# Patient Record
Sex: Female | Born: 1940 | Race: White | Hispanic: No | Marital: Married | Smoking: Never smoker
Health system: Southern US, Community
[De-identification: ages and names within clinical notes are randomized; demographics above are authoritative.]

## PROBLEM LIST (undated history)

## (undated) ENCOUNTER — Emergency Department (HOSPITAL_COMMUNITY): Admission: EM | Discharge status: Absent without leave | Payer: Medicare Other

## (undated) DIAGNOSIS — J189 Pneumonia, unspecified organism: Secondary | ICD-10-CM

## (undated) DIAGNOSIS — E785 Hyperlipidemia, unspecified: Secondary | ICD-10-CM

## (undated) DIAGNOSIS — E669 Obesity, unspecified: Secondary | ICD-10-CM

## (undated) DIAGNOSIS — I480 Paroxysmal atrial fibrillation: Secondary | ICD-10-CM

## (undated) DIAGNOSIS — E119 Type 2 diabetes mellitus without complications: Secondary | ICD-10-CM

## (undated) DIAGNOSIS — G4733 Obstructive sleep apnea (adult) (pediatric): Secondary | ICD-10-CM

## (undated) DIAGNOSIS — F329 Major depressive disorder, single episode, unspecified: Secondary | ICD-10-CM

## (undated) DIAGNOSIS — D509 Iron deficiency anemia, unspecified: Secondary | ICD-10-CM

## (undated) DIAGNOSIS — Z9989 Dependence on other enabling machines and devices: Secondary | ICD-10-CM

## (undated) DIAGNOSIS — I10 Essential (primary) hypertension: Secondary | ICD-10-CM

## (undated) DIAGNOSIS — F32A Depression, unspecified: Secondary | ICD-10-CM

## (undated) DIAGNOSIS — C4490 Unspecified malignant neoplasm of skin, unspecified: Secondary | ICD-10-CM

## (undated) DIAGNOSIS — I2510 Atherosclerotic heart disease of native coronary artery without angina pectoris: Secondary | ICD-10-CM

## (undated) DIAGNOSIS — R011 Cardiac murmur, unspecified: Secondary | ICD-10-CM

## (undated) DIAGNOSIS — K219 Gastro-esophageal reflux disease without esophagitis: Secondary | ICD-10-CM

## (undated) HISTORY — PX: CATARACT EXTRACTION W/ INTRAOCULAR LENS  IMPLANT, BILATERAL: SHX1307

## (undated) HISTORY — DX: Obesity, unspecified: E66.9

## (undated) HISTORY — PX: BACK SURGERY: SHX140

## (undated) HISTORY — DX: Cardiac murmur, unspecified: R01.1

## (undated) HISTORY — PX: TUBAL LIGATION: SHX77

## (undated) HISTORY — DX: Hyperlipidemia, unspecified: E78.5

## (undated) HISTORY — PX: ANTERIOR CERVICAL DECOMP/DISCECTOMY FUSION: SHX1161

## (undated) HISTORY — PX: EYE SURGERY: SHX253

## (undated) HISTORY — DX: Paroxysmal atrial fibrillation: I48.0

## (undated) HISTORY — PX: ABDOMINAL HYSTERECTOMY: SHX81

## (undated) HISTORY — DX: Essential (primary) hypertension: I10

---

## 1999-09-30 ENCOUNTER — Encounter: Admission: RE | Admit: 1999-09-30 | Discharge: 1999-09-30 | Payer: Self-pay | Admitting: Family Medicine

## 1999-09-30 ENCOUNTER — Encounter: Payer: Self-pay | Admitting: Family Medicine

## 1999-11-17 ENCOUNTER — Encounter: Payer: Self-pay | Admitting: Gastroenterology

## 1999-11-17 ENCOUNTER — Ambulatory Visit (HOSPITAL_COMMUNITY): Admission: RE | Admit: 1999-11-17 | Discharge: 1999-11-17 | Payer: Self-pay | Admitting: Gastroenterology

## 2000-01-29 ENCOUNTER — Emergency Department (HOSPITAL_COMMUNITY): Admission: EM | Admit: 2000-01-29 | Discharge: 2000-01-29 | Payer: Self-pay | Admitting: Emergency Medicine

## 2000-10-10 ENCOUNTER — Encounter: Admission: RE | Admit: 2000-10-10 | Discharge: 2000-10-10 | Payer: Self-pay | Admitting: Family Medicine

## 2000-10-10 ENCOUNTER — Encounter: Payer: Self-pay | Admitting: Family Medicine

## 2001-08-16 ENCOUNTER — Encounter: Admission: RE | Admit: 2001-08-16 | Discharge: 2001-08-16 | Payer: Self-pay | Admitting: Family Medicine

## 2001-08-16 ENCOUNTER — Encounter: Payer: Self-pay | Admitting: Family Medicine

## 2001-10-18 ENCOUNTER — Encounter: Payer: Self-pay | Admitting: Family Medicine

## 2001-10-18 ENCOUNTER — Encounter: Admission: RE | Admit: 2001-10-18 | Discharge: 2001-10-18 | Payer: Self-pay | Admitting: Family Medicine

## 2002-10-23 ENCOUNTER — Encounter: Payer: Self-pay | Admitting: Family Medicine

## 2002-10-23 ENCOUNTER — Encounter: Admission: RE | Admit: 2002-10-23 | Discharge: 2002-10-23 | Payer: Self-pay | Admitting: Family Medicine

## 2003-11-26 ENCOUNTER — Encounter: Admission: RE | Admit: 2003-11-26 | Discharge: 2003-11-26 | Payer: Self-pay | Admitting: Family Medicine

## 2005-01-05 ENCOUNTER — Encounter: Admission: RE | Admit: 2005-01-05 | Discharge: 2005-01-05 | Payer: Self-pay | Admitting: Family Medicine

## 2006-01-12 ENCOUNTER — Encounter: Admission: RE | Admit: 2006-01-12 | Discharge: 2006-01-12 | Payer: Self-pay | Admitting: Family Medicine

## 2006-10-24 ENCOUNTER — Ambulatory Visit (HOSPITAL_COMMUNITY): Admission: RE | Admit: 2006-10-24 | Discharge: 2006-10-24 | Payer: Self-pay | Admitting: Gastroenterology

## 2007-02-15 ENCOUNTER — Encounter: Admission: RE | Admit: 2007-02-15 | Discharge: 2007-02-15 | Payer: Self-pay | Admitting: Family Medicine

## 2008-03-23 ENCOUNTER — Encounter: Admission: RE | Admit: 2008-03-23 | Discharge: 2008-03-23 | Payer: Self-pay | Admitting: Family Medicine

## 2009-04-16 ENCOUNTER — Encounter: Admission: RE | Admit: 2009-04-16 | Discharge: 2009-04-16 | Payer: Self-pay | Admitting: Family Medicine

## 2010-05-04 ENCOUNTER — Encounter: Admission: RE | Admit: 2010-05-04 | Discharge: 2010-05-04 | Payer: Self-pay | Admitting: Family Medicine

## 2010-05-09 ENCOUNTER — Encounter: Admission: RE | Admit: 2010-05-09 | Discharge: 2010-05-09 | Payer: Self-pay | Admitting: Orthopedic Surgery

## 2010-10-25 ENCOUNTER — Inpatient Hospital Stay (HOSPITAL_COMMUNITY)
Admission: RE | Admit: 2010-10-25 | Discharge: 2010-10-26 | Payer: Self-pay | Source: Home / Self Care | Attending: Neurosurgery | Admitting: Neurosurgery

## 2010-10-26 LAB — CBC
HCT: 40.3 % (ref 36.0–46.0)
Hemoglobin: 12.9 g/dL (ref 12.0–15.0)
MCH: 27.7 pg (ref 26.0–34.0)
MCHC: 32 g/dL (ref 30.0–36.0)
MCV: 86.7 fL (ref 78.0–100.0)
Platelets: 181 10*3/uL (ref 150–400)
RBC: 4.65 MIL/uL (ref 3.87–5.11)
RDW: 13.3 % (ref 11.5–15.5)
WBC: 7.7 10*3/uL (ref 4.0–10.5)

## 2010-10-26 LAB — GLUCOSE, CAPILLARY
Glucose-Capillary: 169 mg/dL — ABNORMAL HIGH (ref 70–99)
Glucose-Capillary: 158 mg/dL — ABNORMAL HIGH (ref 70–99)
Glucose-Capillary: 210 mg/dL — ABNORMAL HIGH (ref 70–99)
Glucose-Capillary: 257 mg/dL — ABNORMAL HIGH (ref 70–99)

## 2010-10-26 LAB — BASIC METABOLIC PANEL
BUN: 12 mg/dL (ref 6–23)
CO2: 26 mEq/L (ref 19–32)
Calcium: 9.1 mg/dL (ref 8.4–10.5)
Chloride: 106 mEq/L (ref 96–112)
Creatinine, Ser: 0.74 mg/dL (ref 0.4–1.2)
GFR calc Af Amer: >60 mL/min (ref >60)
GFR calc non Af Amer: >60 mL/min (ref >60)
Glucose, Bld: 140 mg/dL — ABNORMAL HIGH (ref 70–99)
Potassium: 4.1 mEq/L (ref 3.5–5.1)
Sodium: 141 mEq/L (ref 135–145)

## 2010-10-26 LAB — SURGICAL PCR SCREEN
MRSA, PCR: NEGATIVE
Staphylococcus aureus: NEGATIVE

## 2010-10-31 LAB — GLUCOSE, CAPILLARY: Glucose-Capillary: 195 mg/dL — ABNORMAL HIGH (ref 70–99)

## 2011-02-24 NOTE — Op Note (Signed)
NAME:  Jefferson, Doris                ACCOUNT NO.:  [id number]   MEDICAL RECORD NO.:  [id number]          PATIENT TYPE:  AMB   LOCATION:  ENDO                         FACILITY:  [facility]   PHYSICIAN:  Danise Edge, M.D.   DATE OF BIRTH:  1941/05/29   DATE OF PROCEDURE:  10/24/2006  DATE OF DISCHARGE:                               OPERATIVE REPORT   PROCEDURE:  Incomplete screening proctocolonoscopy to the hepatic  flexure.   REFERRING PHYSICIAN:  Dr. Marjory Lies, [facility] practice.   PROCEDURE INDICATION:  Ms. Doris Jefferson is a 70 year old female born  Jul 01, 1941.  Doris Jefferson is scheduled to undergo a screening  colonoscopy with polypectomy to prevent colon cancer.   On November 17, 1999, she underwent a normal flexible proctosigmoidoscopy  followed by air-contrast barium enema except for the presence of left  colonic diverticulosis.   ENDOSCOPIST:  Danise Edge, M.D.   PREMEDICATION:  Fentanyl 100 mcg, Versed 10 mg.   PROCEDURE:  After obtaining informed consent, Doris Jefferson was placed in  the left lateral decubitus position.  I administered intravenous  fentanyl and intravenous Versed to achieve conscious sedation for the  procedure.  The patient's blood pressure, oxygen saturation and cardiac  rhythm were monitored throughout the procedure and documented in the  medical record.   Anal inspection and digital rectal exam were normal.  The Pentax  pediatric colonoscope was introduced into the rectum and advanced with  some difficulty to the hepatic flexure due to colonic loop formation.  Despite repositioning the patient and applying external abdominal  pressure, I was only able to intubate the distal ascending colon but  never able to advance the colonoscope down the ascending colon into the  cecum.  Colonic preparation for the exam today was good.   Rectum normal.  Sigmoid colon and descending colon:  A few small diverticula are present  without signs of  diverticulitis.  Splenic flexure normal.  Transverse colon normal.  Hepatic flexure normal.  Cecum and ascending colon were not examined.   ASSESSMENT:  Normal screening proctocolonoscopy to the hepatic flexure  except for the presence of few diverticula in the left colon.  A  complete colonoscopy could not be performed due to colonic loop  formation.   PLAN:  Air-contrast barium enema will be performed at [facility] later on this afternoon.           ______________________________  Danise Edge, M.D.     MJ/MEDQ  D:  10/24/2006  T:  10/25/2006  Job:  [id number]   cc:   Marjory Lies, M.D.

## 2011-05-05 ENCOUNTER — Other Ambulatory Visit: Payer: Self-pay | Admitting: Family Medicine

## 2011-05-05 DIAGNOSIS — Z1231 Encounter for screening mammogram for malignant neoplasm of breast: Secondary | ICD-10-CM

## 2011-05-17 ENCOUNTER — Ambulatory Visit
Admission: RE | Admit: 2011-05-17 | Discharge: 2011-05-17 | Disposition: A | Discharge status: Home / Self Care | Payer: Medicare Other | Source: Ambulatory Visit | Attending: Family Medicine | Admitting: Family Medicine

## 2011-05-17 DIAGNOSIS — Z1231 Encounter for screening mammogram for malignant neoplasm of breast: Secondary | ICD-10-CM

## 2012-06-19 ENCOUNTER — Other Ambulatory Visit: Payer: Self-pay | Admitting: Family Medicine

## 2012-06-19 DIAGNOSIS — Z1231 Encounter for screening mammogram for malignant neoplasm of breast: Secondary | ICD-10-CM

## 2012-07-11 ENCOUNTER — Ambulatory Visit
Admission: RE | Admit: 2012-07-11 | Discharge: 2012-07-11 | Disposition: A | Discharge status: Home / Self Care | Payer: Medicare Other | Source: Ambulatory Visit | Attending: Family Medicine | Admitting: Family Medicine

## 2012-07-11 DIAGNOSIS — Z1231 Encounter for screening mammogram for malignant neoplasm of breast: Secondary | ICD-10-CM

## 2013-06-20 ENCOUNTER — Other Ambulatory Visit: Payer: Self-pay

## 2013-06-20 DIAGNOSIS — Z1231 Encounter for screening mammogram for malignant neoplasm of breast: Secondary | ICD-10-CM

## 2013-07-15 ENCOUNTER — Ambulatory Visit
Admission: RE | Admit: 2013-07-15 | Discharge: 2013-07-15 | Disposition: A | Discharge status: Home / Self Care | Payer: Medicare Other | Source: Ambulatory Visit

## 2013-07-15 DIAGNOSIS — Z1231 Encounter for screening mammogram for malignant neoplasm of breast: Secondary | ICD-10-CM

## 2013-08-18 ENCOUNTER — Encounter: Payer: Self-pay | Admitting: Interventional Cardiology

## 2013-11-03 ENCOUNTER — Other Ambulatory Visit (HOSPITAL_COMMUNITY): Payer: Self-pay | Admitting: Interventional Cardiology

## 2013-11-03 ENCOUNTER — Ambulatory Visit: Payer: Medicare Other | Admitting: Interventional Cardiology

## 2013-11-03 ENCOUNTER — Ambulatory Visit (HOSPITAL_COMMUNITY): Payer: Medicare Other | Attending: Cardiology | Admitting: Radiology

## 2013-11-03 ENCOUNTER — Encounter: Payer: Self-pay | Admitting: Interventional Cardiology

## 2013-11-03 ENCOUNTER — Encounter: Payer: Self-pay | Admitting: *Deleted

## 2013-11-03 DIAGNOSIS — E669 Obesity, unspecified: Secondary | ICD-10-CM | POA: Insufficient documentation

## 2013-11-03 DIAGNOSIS — R011 Cardiac murmur, unspecified: Secondary | ICD-10-CM | POA: Insufficient documentation

## 2013-11-03 DIAGNOSIS — E785 Hyperlipidemia, unspecified: Secondary | ICD-10-CM | POA: Insufficient documentation

## 2013-11-03 DIAGNOSIS — I10 Essential (primary) hypertension: Secondary | ICD-10-CM | POA: Insufficient documentation

## 2013-11-03 DIAGNOSIS — I079 Rheumatic tricuspid valve disease, unspecified: Secondary | ICD-10-CM | POA: Insufficient documentation

## 2013-11-03 DIAGNOSIS — I059 Rheumatic mitral valve disease, unspecified: Secondary | ICD-10-CM | POA: Insufficient documentation

## 2013-11-03 DIAGNOSIS — I517 Cardiomegaly: Secondary | ICD-10-CM | POA: Insufficient documentation

## 2013-11-03 NOTE — Progress Notes (Signed)
Echocardiogram performed.  

## 2013-11-07 ENCOUNTER — Telehealth: Payer: Self-pay

## 2013-11-07 NOTE — Telephone Encounter (Signed)
pt given results of echo.Moderate to severe PA hypertension and mildly dilated RV.pt verbvalized understanding.

## 2013-11-07 NOTE — Telephone Encounter (Signed)
Message copied by Lamar Laundry on Fri Nov 07, 2013  9:07 AM ------      Message from: Daneen Schick      Created: Wed Nov 05, 2013 10:42 PM       Moderate to severe PA hypertension and mildly dilated RV ------

## 2013-11-10 ENCOUNTER — Ambulatory Visit (INDEPENDENT_AMBULATORY_CARE_PROVIDER_SITE_OTHER): Payer: Medicare Other | Admitting: Interventional Cardiology

## 2013-11-10 ENCOUNTER — Encounter: Payer: Self-pay | Admitting: Interventional Cardiology

## 2013-11-10 VITALS — BP 146/76 | HR 133 | Ht 62.0 in | Wt 216.8 lb

## 2013-11-10 DIAGNOSIS — I359 Nonrheumatic aortic valve disorder, unspecified: Secondary | ICD-10-CM

## 2013-11-10 DIAGNOSIS — R0600 Dyspnea, unspecified: Secondary | ICD-10-CM

## 2013-11-10 DIAGNOSIS — R0989 Other specified symptoms and signs involving the circulatory and respiratory systems: Secondary | ICD-10-CM

## 2013-11-10 DIAGNOSIS — R0683 Snoring: Secondary | ICD-10-CM

## 2013-11-10 DIAGNOSIS — I4891 Unspecified atrial fibrillation: Secondary | ICD-10-CM

## 2013-11-10 DIAGNOSIS — R0609 Other forms of dyspnea: Secondary | ICD-10-CM

## 2013-11-10 DIAGNOSIS — I10 Essential (primary) hypertension: Secondary | ICD-10-CM

## 2013-11-10 DIAGNOSIS — I350 Nonrheumatic aortic (valve) stenosis: Secondary | ICD-10-CM

## 2013-11-10 DIAGNOSIS — I2789 Other specified pulmonary heart diseases: Secondary | ICD-10-CM

## 2013-11-10 DIAGNOSIS — E119 Type 2 diabetes mellitus without complications: Secondary | ICD-10-CM | POA: Insufficient documentation

## 2013-11-10 DIAGNOSIS — Z953 Presence of xenogenic heart valve: Secondary | ICD-10-CM | POA: Insufficient documentation

## 2013-11-10 DIAGNOSIS — I272 Pulmonary hypertension, unspecified: Secondary | ICD-10-CM | POA: Insufficient documentation

## 2013-11-10 LAB — BASIC METABOLIC PANEL
BUN: 20 mg/dL (ref 6–23)
CALCIUM: 9.3 mg/dL (ref 8.4–10.5)
CO2: 27 mEq/L (ref 19–32)
Chloride: 106 mEq/L (ref 96–112)
Creatinine, Ser: 0.8 mg/dL (ref 0.4–1.2)
GFR: 75.84 mL/min (ref >60.00)
Glucose, Bld: 175 mg/dL — ABNORMAL HIGH (ref 70–99)
Potassium: 4 mEq/L (ref 3.5–5.1)
SODIUM: 141 meq/L (ref 135–145)

## 2013-11-10 LAB — BRAIN NATRIURETIC PEPTIDE: PRO B NATRI PEPTIDE: 154 pg/mL — AB (ref 0.0–100.0)

## 2013-11-10 MED ORDER — APIXABAN 5 MG PO TABS: 5.0000 mg | ORAL_TABLET | Freq: Two times a day (BID) | ORAL | Status: DC

## 2013-11-10 MED ORDER — DILTIAZEM HCL ER COATED BEADS 240 MG PO CP24: 240.0000 mg | ORAL_CAPSULE | Freq: Every day | ORAL | Status: DC

## 2013-11-10 NOTE — Patient Instructions (Addendum)
Your physician has recommended you make the following change in your medication:  1) STOP Amlodipine 2) START Diltiazem 240mg  daily.an RX has been sent to your pharmacy 3) START Eliquis 5mg  twice daily. You have been given samples today  Labs today: Bnp, Bmet  Your physician has recommended that you have a sleep study. This test records several body functions during sleep, including: brain activity, eye movement, oxygen and carbon dioxide blood levels, heart rate and rhythm, breathing rate and rhythm, the flow of air through your mouth and nose, snoring, body muscle movements, and chest and belly movement.    Your physician has recommended that you wear a holter monitor. Holter monitors are medical devices that record the heart's electrical activity. Doctors most often use these monitors to diagnose arrhythmias. Arrhythmias are problems with the speed or rhythm of the heartbeat. The monitor is a small, portable device. You can wear one while you do your normal daily activities. This is usually used to diagnose what is causing palpitations/syncope (passing out).  Your physician recommends that you schedule a follow-up appointment in: 1 week scheduled for 11/17/13 @ 12pm

## 2013-11-10 NOTE — Progress Notes (Signed)
Patient ID: Doris Jefferson, female   DOB: 1941-06-03, 73 y.o.   MRN: [id number] Past Medical History  Hypertension   Diabetes mellitus   Hyperlipidemia   Systolic murmur  Echocardiogram January 2013: Conclusions: 1. Mild aortic valve stenosis. 2. Moderate mitral annular calcification. 3. Mild mitral valve regurgitation. 4. Left ventricular ejection fraction estimated by 2D at 60-65 percent. 5. Trivial tricuspid regurgitation. 6. Analysis of mitral valve inflow, pulmonary vein Doppler and tissue Doppler suggests grade Ia diastolic dysfunction with elevated left atrial pressure.     [facility]. [address] [place], [place]  [address] Phone: [phone] Fax:  [phone]  Date:  11/10/2013   ID:  Doris Jefferson, DOB May 02, 1941, MRN [id number]  PCP:  No primary provider on file.   ASSESSMENT:  1. New onset atrial fibrillation, unknown duration, asymptomatic 2. Pulmonary hypertension by echocardiography, PA systolic greater than 50 mm mercury 3. Mild aortic stenosis 4. Dyspnea on exertion 5. Diabetes mellitus 6. Hypertension  PLAN:  1. 48 hour Holter monitor 2. Discontinue amlodipine and start diltiazem CD 240 mg per day 3. Eliquis 5 mg twice a day 4. Office followup in one week 5. Basic metabolic panel, brain natruretic peptide 6. We'll need sleep study to rule out sleep apnea.   SUBJECTIVE: Doris Jefferson is a 73 y.o. female who is here for followup of mild aortic stenosis. An echocardiogram was performed a week ago which demonstrated moderately severe pulmonary hypertension with an estimated PA systolic of 52 mm mercury. No mention of arrhythmia was noted at that time. Today, despite being asymptomatic, the patient's electrocardiogram reveals atrial fib with a rapid ventricular response at 133 beats per minute. This is confirmed on exam. There is left axis deviation with nonspecific ST-T wave changes noted. There is no peripheral edema. She denies orthopnea. She is  ambulatory without constraints. She denies chest pain. There is no peripheral swelling . She denies palpitations.   Wt Readings from Last 3 Encounters:  11/10/13 216 lb 12.8 oz (98.34 kg)     Past Medical History  Diagnosis Date  . Heart murmur   . HTN (hypertension)   . Diabetes   . Hyperlipidemia     Current Outpatient Prescriptions  Medication Sig Dispense Refill  . amLODipine (NORVASC) 10 MG tablet Take 10 mg by mouth daily.      [name] aspirin 81 MG tablet Take 81 mg by mouth daily.      . benazepril (LOTENSIN) 20 MG tablet Take 20 mg by mouth daily.      [name] exenatide (BYETTA) 10 MCG/0.04ML SOPN injection Inject 10 mcg into the skin 2 (two) times daily with a meal.      . glimepiride (AMARYL) 4 MG tablet Take 4 mg by mouth daily with breakfast.      . metFORMIN (GLUCOPHAGE) 1000 MG tablet Take 1,000 mg by mouth 2 (two) times daily with a meal.      . Omega-3 Fatty Acids (FISH OIL) 1000 MG CAPS Take 1 capsule by mouth daily.      [name] PARoxetine (PAXIL) 20 MG tablet Take 20 mg by mouth daily.      . rosuvastatin (CRESTOR) 20 MG tablet Take 20 mg by mouth daily.      [name] terazosin (HYTRIN) 5 MG capsule Take 5 mg by mouth at bedtime.       No current facility-administered medications for this visit.    Allergies:    Allergies  Allergen Reactions  . Minocin [  Minocycline Hcl] Other (See Comments)    Throat swelling    Social History:  The patient  reports that she has never smoked. She does not have any smokeless tobacco history on file.   ROS:  Please see the history of present illness.   Denies palpitations. No syncope. No transient neurological symptoms.   She has never had syncope. There is no history of arrhythmia. All other systems reviewed and negative.   OBJECTIVE: VS:  BP 146/76  Pulse 133  Ht 5\' 2"  (1.575 m)  Wt 216 lb 12.8 oz (98.34 kg)  BMI 39.64 kg/m2 Well nourished, well developed, in no acute distress, obese. HEENT: normal Neck: JVD flat. Carotid bruit absent    Cardiac:  normal S1, S2; IIRR; 2 of 6 systolic murmur Lungs:  clear to auscultation bilaterally, no wheezing, rhonchi or rales Abd: soft, nontender, no hepatomegaly Ext: Edema absent. Pulses 2+ bilateral somewhat thready Skin: warm and dry Neuro:  CNs 2-12 intact, no focal abnormalities noted  EKG:  Atrial fibrillation with rapid ventricular response with left axis deviation and poor R-wave progression     ECHOCARDIOGRAM: January 2015 Study Conclusions  - Left ventricle: The cavity size was normal. Systolic function was normal. The estimated ejection fraction was in the range of 55% to 60%. Wall motion was normal; there were no regional wall motion abnormalities. Doppler parameters are consistent with high ventricular filling pressure. - Mitral valve: Calcified annulus. Mildly thickened, mildly calcified leaflets anterior. - Left atrium: The atrium was mildly dilated. - Right ventricle: The cavity size was mildly dilated. Wall thickness was normal. - Tricuspid valve: Mild regurgitation. - Pulmonary arteries: Systolic pressure was moderately   Signed, Illene Labrador III, MD 11/10/2013 2:45 PM

## 2013-11-12 ENCOUNTER — Telehealth: Payer: Self-pay

## 2013-11-12 NOTE — Telephone Encounter (Signed)
Message copied by Lamar Laundry on Wed Nov 12, 2013  3:45 PM ------      Message from: Daneen Schick      Created: Tue Nov 11, 2013  8:54 AM       Labs are good under the circumstances. No immediate changes in plan from that which was initiated at the office visit ------

## 2013-11-12 NOTE — Telephone Encounter (Signed)
pt given lab results.Labs are good under the circumstances. No immediate changes in plan from that which was initiated at the office visit.pt verbalized undestanding.

## 2013-11-14 ENCOUNTER — Encounter: Payer: Self-pay | Admitting: Radiology

## 2013-11-14 ENCOUNTER — Encounter (INDEPENDENT_AMBULATORY_CARE_PROVIDER_SITE_OTHER): Payer: Medicare Other

## 2013-11-14 DIAGNOSIS — I4891 Unspecified atrial fibrillation: Secondary | ICD-10-CM

## 2013-11-14 NOTE — Progress Notes (Signed)
Patient ID: Doris Jefferson, female   DOB: 1941/08/05, 73 y.o.   MRN: [id number] E cardio 48hr holter monitor applied

## 2013-11-17 ENCOUNTER — Encounter: Payer: Self-pay | Admitting: Interventional Cardiology

## 2013-11-17 ENCOUNTER — Ambulatory Visit (INDEPENDENT_AMBULATORY_CARE_PROVIDER_SITE_OTHER): Payer: Medicare Other | Admitting: Interventional Cardiology

## 2013-11-17 VITALS — BP 108/57 | HR 105 | Ht 63.0 in | Wt 221.0 lb

## 2013-11-17 DIAGNOSIS — I359 Nonrheumatic aortic valve disorder, unspecified: Secondary | ICD-10-CM

## 2013-11-17 DIAGNOSIS — I10 Essential (primary) hypertension: Secondary | ICD-10-CM

## 2013-11-17 DIAGNOSIS — Z5181 Encounter for therapeutic drug level monitoring: Secondary | ICD-10-CM | POA: Insufficient documentation

## 2013-11-17 DIAGNOSIS — Z7901 Long term (current) use of anticoagulants: Secondary | ICD-10-CM

## 2013-11-17 DIAGNOSIS — Z79899 Other long term (current) drug therapy: Secondary | ICD-10-CM

## 2013-11-17 DIAGNOSIS — I350 Nonrheumatic aortic (valve) stenosis: Secondary | ICD-10-CM

## 2013-11-17 DIAGNOSIS — I4891 Unspecified atrial fibrillation: Secondary | ICD-10-CM

## 2013-11-17 MED ORDER — AMIODARONE HCL 200 MG PO TABS: 400.0000 mg | ORAL_TABLET | Freq: Two times a day (BID) | ORAL | Status: DC

## 2013-11-17 NOTE — Patient Instructions (Signed)
Start Amiodarone 200mg  twice daily. An Rx has been sent to your pharmacy.  Take all other medications as prescribed  Your physician recommends that you return for lab work in: 2 weeks  Your physician recommends that you schedule a follow-up appointment on 12/02/13 @8 :30am with Richardson Dopp, PA

## 2013-11-17 NOTE — Progress Notes (Signed)
Patient ID: Doris Jefferson, female   DOB: 04/03/1941, 73 y.o.   MRN: [id number]    [address], [address] [place], [place]  [address] Phone: [phone] Fax:  [phone]  Date:  11/17/2013   ID:  Doris Jefferson, DOB 12-Jul-1941, MRN [id number]  PCP:  Stephens Shire, MD   ASSESSMENT:  1. Atrial fibrillation of unknown duration still with poor rate control 2. Mild aortic stenosis 3. Chronic anticoagulation therapy, recently started Eliquis 4. Hypertension  PLAN:  1. Start amiodarone 200 mg twice a day 2. He had a prolonged discussion concerning the management of atrial fibrillation. We discussed rate control versus rhythm control. After answering questions concerning the pros and cons of each approach, we have decided to attempt rhythm control at least once. We discussed the possibility of amiodarone causing pharmacologic conversion. There likelihood was described as less than 30%. We discussed the need for electrical cardioversion and if she remains in atrial fibrillation after 3-4 weeks of amiodarone. She understands the risks associated with electrical cardioversion including possible anesthesia associated problems.   SUBJECTIVE: Doris Jefferson is a 73 y.o. female who is back today in followup after having atrial fibrillation with rapid rate identifiable last office visit, which was a routine followup for aortic stenosis. She was started on anticoagulation therapy and this had no complications. Amlodipine was switched to diltiazem. There has been some mild rate slowing but there is still an adequate control. She has worn a monitor for 48 hours and the results are pending. She denies dyspnea, chest pain, fatigue, edema, and syncope.   Wt Readings from Last 3 Encounters:  11/17/13 221 lb (100.245 kg)  11/10/13 216 lb 12.8 oz (98.34 kg)     Past Medical History  Diagnosis Date  . Heart murmur   . HTN (hypertension)   . Diabetes   . Hyperlipidemia     Current Outpatient  Prescriptions  Medication Sig Dispense Refill  . apixaban (ELIQUIS) 5 MG TABS tablet Take 1 tablet (5 mg total) by mouth 2 (two) times daily.      . benazepril (LOTENSIN) 20 MG tablet Take 20 mg by mouth daily.      [name] diltiazem (CARDIZEM CD) 240 MG 24 hr capsule Take 1 capsule (240 mg total) by mouth daily.  30 capsule  11  . exenatide (BYETTA) 10 MCG/0.04ML SOPN injection Inject 10 mcg into the skin 2 (two) times daily with a meal.      . glimepiride (AMARYL) 4 MG tablet Take 4 mg by mouth daily with breakfast.      . metFORMIN (GLUCOPHAGE) 1000 MG tablet Take 1,000 mg by mouth 2 (two) times daily with a meal.      . Omega-3 Fatty Acids (FISH OIL) 1000 MG CAPS Take 1 capsule by mouth daily.      [name] PARoxetine (PAXIL) 20 MG tablet Take 20 mg by mouth daily.      . rosuvastatin (CRESTOR) 20 MG tablet Take 20 mg by mouth daily.      [name] terazosin (HYTRIN) 5 MG capsule Take 5 mg by mouth at bedtime.       No current facility-administered medications for this visit.    Allergies:    Allergies  Allergen Reactions  . Minocin [Minocycline Hcl] Other (See Comments)    Throat swelling    Social History:  The patient  reports that she has never smoked. She does not have any smokeless tobacco history on file.   ROS:  Please see the history of present illness.   No bleeding, neurological complaints, edema, or complications/side effects to recently started medications   All other systems reviewed and negative.   OBJECTIVE: VS:  BP 108/57  Pulse 105  Ht 5\' 3"  (1.6 m)  Wt 221 lb (100.245 kg)  BMI 39.16 kg/m2  SpO2 94% Well nourished, well developed, in no acute distress, obese HEENT: normal Neck: JVD flat. Carotid bruit absent  Cardiac:  normal S1, S2; IIRR; no murmur Lungs:  clear to auscultation bilaterally, no wheezing, rhonchi or rales Abd: soft, nontender, no hepatomegaly Ext: Edema  absent. Pulses  2+ and symmetric Skin: warm and dry Neuro:  CNs 2-12 intact, no focal abnormalities  noted  EKG:   not repeated       Signed, Illene Labrador III, MD 11/17/2013 12:42 PM

## 2013-11-24 ENCOUNTER — Telehealth: Payer: Self-pay | Admitting: Cardiology

## 2013-11-24 ENCOUNTER — Telehealth: Payer: Self-pay

## 2013-11-24 DIAGNOSIS — I4891 Unspecified atrial fibrillation: Secondary | ICD-10-CM

## 2013-11-24 MED ORDER — AMIODARONE HCL 200 MG PO TABS: 200.0000 mg | ORAL_TABLET | Freq: Two times a day (BID) | ORAL | Status: DC

## 2013-11-24 NOTE — Telephone Encounter (Signed)
Please let patient know that she has severe OSA and set up CPAP titration in sleep lab

## 2013-11-24 NOTE — Telephone Encounter (Signed)
called pt with holter monitor results.Afib with RVR.still poor rate control.will improve with Amio.pt verbalized undetstanding.

## 2013-11-26 NOTE — Telephone Encounter (Signed)
LVM for pt to return call

## 2013-11-26 NOTE — Telephone Encounter (Signed)
Form filled out for pt and given to [place] to fax to [facility]

## 2013-12-01 NOTE — Telephone Encounter (Signed)
lvm to return call.

## 2013-12-02 ENCOUNTER — Ambulatory Visit: Payer: Medicare Other | Admitting: Physician Assistant

## 2013-12-05 NOTE — Telephone Encounter (Signed)
Since I have been unable to reach pt I left a VM with [facility] asking if they have been in contact with pt to set up CPAP titration.

## 2013-12-08 ENCOUNTER — Telehealth: Payer: Self-pay | Admitting: Cardiology

## 2013-12-08 NOTE — Telephone Encounter (Signed)
Lm for Amy at [place] that Andee Poles was not in office today but would forward message for her to call.

## 2013-12-08 NOTE — Telephone Encounter (Signed)
New message    Sleep center calling back to speak with Andee Poles C

## 2013-12-09 ENCOUNTER — Encounter: Payer: Self-pay | Admitting: *Deleted

## 2013-12-09 ENCOUNTER — Ambulatory Visit (INDEPENDENT_AMBULATORY_CARE_PROVIDER_SITE_OTHER): Payer: Medicare Other | Admitting: Physician Assistant

## 2013-12-09 ENCOUNTER — Encounter: Payer: Self-pay | Admitting: Physician Assistant

## 2013-12-09 VITALS — BP 150/80 | HR 109 | Ht 63.0 in | Wt 223.0 lb

## 2013-12-09 DIAGNOSIS — I4891 Unspecified atrial fibrillation: Secondary | ICD-10-CM

## 2013-12-09 DIAGNOSIS — I272 Pulmonary hypertension, unspecified: Secondary | ICD-10-CM

## 2013-12-09 DIAGNOSIS — I10 Essential (primary) hypertension: Secondary | ICD-10-CM

## 2013-12-09 DIAGNOSIS — I2789 Other specified pulmonary heart diseases: Secondary | ICD-10-CM

## 2013-12-09 MED ORDER — DILTIAZEM HCL ER COATED BEADS 240 MG PO CP24: 240.0000 mg | ORAL_CAPSULE | Freq: Every day | ORAL | Status: DC

## 2013-12-09 MED ORDER — APIXABAN 5 MG PO TABS: 5.0000 mg | ORAL_TABLET | Freq: Two times a day (BID) | ORAL | Status: DC

## 2013-12-09 NOTE — Patient Instructions (Addendum)
Take a dose of Eliquis as soon as you get home today. Resume taking Eliquis twice daily tomorrow. Do not miss a dose of Eliquis. We will also send a prescription to your pharmacy so you don't run out.   Your physician has recommended that you have a Cardioversion (DCCV). THIS WILL BE ON 12/19/13 @ 12 PM WITH DR. Tamala Julian; SEE CARDIOVERSION Electrical Cardioversion uses a jolt of electricity to your heart either through paddles or wired patches attached to your chest. This is a controlled, usually prescheduled, procedure. Defibrillation is done under light anesthesia in the hospital, and you usually go home the day of the procedure. This is done to get your heart back into a normal rhythm. You are not awake for the procedure. Please see the instruction sheet given to you today.  Your physician recommends that you return for lab work in: TODAY BMET, CBC W/DIFF, PT/INR  Your physician recommends that you schedule a follow-up appointment ON 12/31/13 @ 3:20 WITH Howard Bunte, [place] IS IN THE OFFICE

## 2013-12-09 NOTE — Telephone Encounter (Signed)
[facility] called and I was not in office. I LVM for them to call me back tomorrow.

## 2013-12-09 NOTE — H&P (Signed)
History and Physical  Date:  12/09/2013   ID:  Doris Jefferson, DOB [birth year], MRN [id number]  PCP:  Stephens Shire, MD  Cardiologist:  Dr. Daneen Schick     History of Present Illness: Doris Jefferson is a 73 y.o. female with a hx of mild AS, pulmonary HTN, DM, HTN.  She was recently noted to be in AFib with RVR.  After much review, it was decided to pursue rhythm control.  She was placed on Amiodarone last month.  She is on Eliquis for anticoagulation.  She is brought back today with an eye towards DCCV if she remains in AFib.    Echo (10/2013):  EF 55-60%, no RWMA, MAC, mild LAE, mild RVE, mild TR, PASP 53 (mod pulmo HTN).    She is doing well. She denies chest pain, dyspnea, orthopnea, PND, palpitations.  She has some mild pedal edema.  This is unchanged.    Recent Labs: 11/10/2013: Creatinine 0.8; Potassium 4.0; Pro B Natriuretic peptide (BNP) 154.0*   Wt Readings from Last 3 Encounters:  11/17/13 221 lb (100.245 kg)  11/10/13 216 lb 12.8 oz (98.34 kg)     Past Medical History  Diagnosis Date  . Heart murmur   . HTN (hypertension)   . Diabetes   . Hyperlipidemia     Current Outpatient Prescriptions  Medication Sig Dispense Refill  . amiodarone (PACERONE) 200 MG tablet Take 1 tablet (200 mg total) by mouth 2 (two) times daily.      [name] apixaban (ELIQUIS) 5 MG TABS tablet Take 1 tablet (5 mg total) by mouth 2 (two) times daily.      . benazepril (LOTENSIN) 20 MG tablet Take 20 mg by mouth daily.      [name] diltiazem (CARDIZEM CD) 240 MG 24 hr capsule Take 1 capsule (240 mg total) by mouth daily.  30 capsule  11  . exenatide (BYETTA) 10 MCG/0.04ML SOPN injection Inject 10 mcg into the skin 2 (two) times daily with a meal.      . glimepiride (AMARYL) 4 MG tablet Take 4 mg by mouth daily with breakfast.      . metFORMIN (GLUCOPHAGE) 1000 MG tablet Take 1,000 mg by mouth 2 (two) times daily with a meal.      . Omega-3 Fatty Acids (FISH OIL) 1000 MG CAPS Take 1 capsule by mouth daily.       [name] PARoxetine (PAXIL) 20 MG tablet Take 20 mg by mouth daily.      . rosuvastatin (CRESTOR) 20 MG tablet Take 20 mg by mouth daily.      [name] terazosin (HYTRIN) 5 MG capsule Take 5 mg by mouth at bedtime.       No current facility-administered medications for this visit.    Allergies:   Minocin   Social History:  The patient  reports that she has never smoked. She does not have any smokeless tobacco history on file.   Family History:  The patient's family history includes Heart disease in her father; Hypertension in her brother and father; Melanoma in her mother.   ROS:  Please see the history of present illness.   No bleeding problems.   All other systems reviewed and negative.   PHYSICAL EXAM: VS:  BP 150/80  Pulse 109  Ht 5\' 3"  (1.6 m)  Wt 223 lb (101.152 kg)  BMI 39.51 kg/m2 Well nourished, well developed, in no acute distress HEENT: normal Neck: no JVD Cardiac:  normal S1, S2; irregularly irregular rhythm;  no murmur Lungs:  clear to auscultation bilaterally, no wheezing, rhonchi or rales Abd: soft, nontender, no hepatomegaly Ext: trace bilateral LE edema Skin: warm and dry Neuro:  CNs 2-12 intact, no focal abnormalities noted  EKG:  AFib, HR 109, NSSTTW changes     ASSESSMENT AND PLAN:  1. Atrial Fibrillation:  She remains in AFib with RVR.  Rate is better controlled.  Continue current dose of Diltiazem and Eliquis.  Of note, she did not take Eliquis this AM b/c she was out.  We have given her samples.  I discussed this with Dr. Daneen Schick.  We feel it is ok to proceed with DCCV.  She will be set up next week with Dr. Daneen Schick. 2. Hypertension:  Borderline control.  Continue to monitor.  3. Diabetes Mellitus:  F/u with primary care.  4. Pulmonary HTN:  Sleep study is pending. 5. Mild Aortic Stenosis:  No evidence of AS on recent echo. 6. Disposition:  Arrange DCCV next week with Dr. Daneen Schick. F/u 2 weeks after DCCV with Dr. Daneen Schick or me.  Signed, Richardson Dopp, PA-C  12/09/2013 3:10 PM

## 2013-12-09 NOTE — Progress Notes (Signed)
[place], [place],   [address] Phone: [phone] Fax:  [phone]  Date:  12/09/2013   ID:  Doris Jefferson, DOB 04-05-1941, MRN [id number]  PCP:  Stephens Shire, MD  Cardiologist:  Dr. Daneen Schick     History of Present Illness: Doris Jefferson is a 73 y.o. female with a hx of mild AS, pulmonary HTN, DM, HTN.  She was recently noted to be in AFib with RVR.  After much review, it was decided to pursue rhythm control.  She was placed on Amiodarone last month.  She is on Eliquis for anticoagulation.  She is brought back today with an eye towards DCCV if she remains in AFib.    Echo (10/2013):  EF 55-60%, no RWMA, MAC, mild LAE, mild RVE, mild TR, PASP 53 (mod pulmo HTN).    She is doing well. She denies chest pain, dyspnea, orthopnea, PND, palpitations.  She has some mild pedal edema.  This is unchanged.    Recent Labs: 11/10/2013: Creatinine 0.8; Potassium 4.0; Pro B Natriuretic peptide (BNP) 154.0*   Wt Readings from Last 3 Encounters:  11/17/13 221 lb (100.245 kg)  11/10/13 216 lb 12.8 oz (98.34 kg)     Past Medical History  Diagnosis Date  . Heart murmur   . HTN (hypertension)   . Diabetes   . Hyperlipidemia     Current Outpatient Prescriptions  Medication Sig Dispense Refill  . amiodarone (PACERONE) 200 MG tablet Take 1 tablet (200 mg total) by mouth 2 (two) times daily.      [name] apixaban (ELIQUIS) 5 MG TABS tablet Take 1 tablet (5 mg total) by mouth 2 (two) times daily.      . benazepril (LOTENSIN) 20 MG tablet Take 20 mg by mouth daily.      [name] diltiazem (CARDIZEM CD) 240 MG 24 hr capsule Take 1 capsule (240 mg total) by mouth daily.  30 capsule  11  . exenatide (BYETTA) 10 MCG/0.04ML SOPN injection Inject 10 mcg into the skin 2 (two) times daily with a meal.      . glimepiride (AMARYL) 4 MG tablet Take 4 mg by mouth daily with breakfast.      . metFORMIN (GLUCOPHAGE) 1000 MG tablet Take 1,000 mg by mouth 2 (two) times daily with a meal.      . Omega-3  Fatty Acids (FISH OIL) 1000 MG CAPS Take 1 capsule by mouth daily.      [name] PARoxetine (PAXIL) 20 MG tablet Take 20 mg by mouth daily.      . rosuvastatin (CRESTOR) 20 MG tablet Take 20 mg by mouth daily.      [name] terazosin (HYTRIN) 5 MG capsule Take 5 mg by mouth at bedtime.       No current facility-administered medications for this visit.    Allergies:   Minocin   Social History:  The patient  reports that she has never smoked. She does not have any smokeless tobacco history on file.   Family History:  The patient's family history includes Heart disease in her father; Hypertension in her brother and father; Melanoma in her mother.   ROS:  Please see the history of present illness.   No bleeding problems.   All other systems reviewed and negative.   PHYSICAL EXAM: VS:  BP 150/80  Pulse 109  Ht 5\' 3"  (1.6 m)  Wt 223 lb (101.152 kg)  BMI 39.51 kg/m2 Well nourished, well developed, in no acute distress  HEENT: normal Neck: no JVD Cardiac:  normal S1, S2; irregularly irregular rhythm; no murmur Lungs:  clear to auscultation bilaterally, no wheezing, rhonchi or rales Abd: soft, nontender, no hepatomegaly Ext: trace bilateral LE edema Skin: warm and dry Neuro:  CNs 2-12 intact, no focal abnormalities noted  EKG:  AFib, HR 109, NSSTTW changes     ASSESSMENT AND PLAN:  1. Atrial Fibrillation:  She remains in AFib with RVR.  Rate is better controlled.  Continue current dose of Diltiazem and Eliquis.  Of note, she did not take Eliquis this AM b/c she was out.  We have given her samples.  I discussed this with Dr. Daneen Schick.  We feel it is ok to proceed with DCCV.  She will be set up next week with Dr. Daneen Schick. 2. Hypertension:  Borderline control.  Continue to monitor.  3. Diabetes Mellitus:  F/u with primary care.  4. Pulmonary HTN:  Sleep study is pending. 5. Mild Aortic Stenosis:  No evidence of AS on recent echo. 6. Disposition:  Arrange DCCV next week with Dr. Daneen Schick. F/u  2 weeks after DCCV with Dr. Daneen Schick or me.  Signed, Richardson Dopp, PA-C  12/09/2013 3:10 PM

## 2013-12-10 ENCOUNTER — Telehealth: Payer: Self-pay | Admitting: *Deleted

## 2013-12-10 DIAGNOSIS — Z79899 Other long term (current) drug therapy: Secondary | ICD-10-CM

## 2013-12-10 LAB — BASIC METABOLIC PANEL
BUN: 14 mg/dL (ref 6–23)
CHLORIDE: 106 meq/L (ref 96–112)
CO2: 29 mEq/L (ref 19–32)
CREATININE: 0.7 mg/dL (ref 0.4–1.2)
Calcium: 9.5 mg/dL (ref 8.4–10.5)
GFR: 84.39 mL/min (ref >60.00)
Glucose, Bld: 108 mg/dL — ABNORMAL HIGH (ref 70–99)
POTASSIUM: 4.3 meq/L (ref 3.5–5.1)
Sodium: 141 mEq/L (ref 135–145)

## 2013-12-10 LAB — CBC WITH DIFFERENTIAL/PLATELET
BASOS PCT: 0.8 % (ref 0.0–3.0)
Basophils Absolute: 0.1 10*3/uL (ref 0.0–0.1)
EOS PCT: 2.5 % (ref 0.0–5.0)
Eosinophils Absolute: 0.2 10*3/uL (ref 0.0–0.7)
HEMATOCRIT: 34.5 % — AB (ref 36.0–46.0)
Hemoglobin: 11.1 g/dL — ABNORMAL LOW (ref 12.0–15.0)
Lymphocytes Relative: 27.8 % (ref 12.0–46.0)
Lymphs Abs: 2.3 10*3/uL (ref 0.7–4.0)
MCHC: 32.2 g/dL (ref 30.0–36.0)
MCV: 83.2 fl (ref 78.0–100.0)
MONO ABS: 0.5 10*3/uL (ref 0.1–1.0)
MONOS PCT: 6.2 % (ref 3.0–12.0)
NEUTROS PCT: 62.7 % (ref 43.0–77.0)
Neutro Abs: 5.2 10*3/uL (ref 1.4–7.7)
Platelets: 159 10*3/uL (ref 150.0–400.0)
RBC: 4.15 Mil/uL (ref 3.87–5.11)
RDW: 14.9 % — ABNORMAL HIGH (ref 11.5–14.6)
WBC: 8.3 10*3/uL (ref 4.5–10.5)

## 2013-12-10 LAB — PROTIME-INR
INR: 1.2 ratio — AB (ref 0.8–1.0)
Prothrombin Time: 13.1 s — ABNORMAL HIGH (ref 10.2–12.4)

## 2013-12-10 NOTE — Telephone Encounter (Signed)
lmptcb for lab results 

## 2013-12-10 NOTE — Telephone Encounter (Signed)
pt rtnd my call and has been now notified about lab results, repeat cbc on 3/20..f/u w/Scott W. PA 3/25.Marland Kitchenpt verbalized understanding to all instructions

## 2013-12-10 NOTE — Telephone Encounter (Signed)
Pt set up for CPAP Titration 12/11/13

## 2013-12-11 ENCOUNTER — Encounter: Payer: Self-pay | Admitting: Cardiology

## 2013-12-11 NOTE — H&P (Signed)
Plan and treatment option as well as exam are accurate as documented.

## 2013-12-15 ENCOUNTER — Encounter: Payer: Self-pay | Admitting: Cardiology

## 2013-12-19 ENCOUNTER — Encounter (HOSPITAL_COMMUNITY)
Admission: RE | Disposition: A | Discharge status: Home / Self Care | Payer: Self-pay | Source: Ambulatory Visit | Attending: Interventional Cardiology

## 2013-12-19 ENCOUNTER — Ambulatory Visit (HOSPITAL_COMMUNITY)
Admission: RE | Admit: 2013-12-19 | Discharge: 2013-12-19 | Disposition: A | Discharge status: Home / Self Care | Payer: Medicare Other | Source: Ambulatory Visit | Attending: Interventional Cardiology | Admitting: Interventional Cardiology

## 2013-12-19 ENCOUNTER — Encounter (HOSPITAL_COMMUNITY): Payer: Self-pay

## 2013-12-19 ENCOUNTER — Ambulatory Visit (HOSPITAL_COMMUNITY): Payer: Medicare Other | Admitting: Anesthesiology

## 2013-12-19 ENCOUNTER — Encounter (HOSPITAL_COMMUNITY): Payer: Medicare Other | Admitting: Anesthesiology

## 2013-12-19 DIAGNOSIS — E785 Hyperlipidemia, unspecified: Secondary | ICD-10-CM | POA: Insufficient documentation

## 2013-12-19 DIAGNOSIS — I10 Essential (primary) hypertension: Secondary | ICD-10-CM | POA: Insufficient documentation

## 2013-12-19 DIAGNOSIS — I4891 Unspecified atrial fibrillation: Secondary | ICD-10-CM

## 2013-12-19 DIAGNOSIS — I2789 Other specified pulmonary heart diseases: Secondary | ICD-10-CM | POA: Insufficient documentation

## 2013-12-19 DIAGNOSIS — R011 Cardiac murmur, unspecified: Secondary | ICD-10-CM | POA: Insufficient documentation

## 2013-12-19 DIAGNOSIS — E119 Type 2 diabetes mellitus without complications: Secondary | ICD-10-CM | POA: Insufficient documentation

## 2013-12-19 HISTORY — PX: CARDIOVERSION: SHX1299

## 2013-12-19 LAB — POCT I-STAT 4, (NA,K, GLUC, HGB,HCT)
GLUCOSE: 167 mg/dL — AB (ref 70–99)
HCT: 32 % — ABNORMAL LOW (ref 36.0–46.0)
HEMOGLOBIN: 10.9 g/dL — AB (ref 12.0–15.0)
Potassium: 4.1 mEq/L (ref 3.7–5.3)
SODIUM: 142 meq/L (ref 137–147)

## 2013-12-19 LAB — GLUCOSE, CAPILLARY: Glucose-Capillary: 165 mg/dL — ABNORMAL HIGH (ref 70–99)

## 2013-12-19 SURGERY — CARDIOVERSION
Anesthesia: General

## 2013-12-19 MED ORDER — PROPOFOL 10 MG/ML IV BOLUS
INTRAVENOUS | Status: DC | PRN
  Administered 2013-12-19: 70 mg via INTRAVENOUS

## 2013-12-19 MED ORDER — AMIODARONE HCL 200 MG PO TABS: 200.0000 mg | ORAL_TABLET | Freq: Every day | ORAL | Status: DC

## 2013-12-19 MED ORDER — SODIUM CHLORIDE 0.9 % IV SOLN
INTRAVENOUS | Status: DC | PRN
  Administered 2013-12-19: 12:00:00 via INTRAVENOUS

## 2013-12-19 MED ORDER — LIDOCAINE HCL (CARDIAC) 20 MG/ML IV SOLN
INTRAVENOUS | Status: DC | PRN
  Administered 2013-12-19: 100 mg via INTRAVENOUS

## 2013-12-19 NOTE — Anesthesia Preprocedure Evaluation (Addendum)
Anesthesia Evaluation  Patient identified by MRN, date of birth, ID band Patient awake    Reviewed: Allergy & Precautions, H&P , NPO status , Patient's Chart, lab work & pertinent test results  Airway Mallampati: I TM Distance: >3 FB Neck ROM: Full    Dental  (+) Dental Advisory Given, Teeth Intact   Pulmonary neg pulmonary ROS,    Pulmonary exam normal       Cardiovascular hypertension, Pt. on medications and Pt. on home beta blockers Rhythm:Irregular Rate:Tachycardia     Neuro/Psych negative neurological ROS  negative psych ROS   GI/Hepatic negative GI ROS, Neg liver ROS,   Endo/Other  diabetes  Renal/GU negative Renal ROS     Musculoskeletal   Abdominal   Peds  Hematology   Anesthesia Other Findings   Reproductive/Obstetrics                         Anesthesia Physical Anesthesia Plan  ASA: III  Anesthesia Plan: General   Post-op Pain Management:    Induction: Intravenous  Airway Management Planned: Mask  Additional Equipment:   Intra-op Plan:   Post-operative Plan:   Informed Consent: I have reviewed the patients History and Physical, chart, labs and discussed the procedure including the risks, benefits and alternatives for the proposed anesthesia with the patient or authorized representative who has indicated his/her understanding and acceptance.   Dental advisory given  Plan Discussed with: CRNA, Anesthesiologist and Surgeon  Anesthesia Plan Comments:        Anesthesia Quick Evaluation

## 2013-12-19 NOTE — Anesthesia Postprocedure Evaluation (Signed)
  Anesthesia Post-op Note  Patient: Doris Jefferson  Procedure(s) Performed: Procedure(s): CARDIOVERSION (N/A)  Patient Location: Endoscopy Unit  Anesthesia Type:General  Level of Consciousness: awake, alert  and oriented  Airway and Oxygen Therapy: Patient Spontanous Breathing and Patient connected to nasal cannula oxygen  Post-op Pain: none  Post-op Assessment: Post-op Vital signs reviewed, Patient's Cardiovascular Status Stable, Respiratory Function Stable, Patent Airway and No signs of Nausea or vomiting  Post-op Vital Signs: Reviewed and stable  Complications: No apparent anesthesia complications

## 2013-12-19 NOTE — Preoperative (Signed)
Beta Blockers   Reason not to administer Beta Blockers:Not Applicable 

## 2013-12-19 NOTE — Progress Notes (Signed)
Update given to Dr Tamala Julian ok to discharge patient home v/s stabel.

## 2013-12-19 NOTE — Transfer of Care (Signed)
Immediate Anesthesia Transfer of Care Note  Patient: Doris Jefferson  Procedure(s) Performed: Procedure(s): CARDIOVERSION (N/A)  Patient Location: Endoscopy Unit  Anesthesia Type:General  Level of Consciousness: awake, alert  and oriented  Airway & Oxygen Therapy: Patient Spontanous Breathing and Patient connected to nasal cannula oxygen  Post-op Assessment: Report given to PACU RN, Post -op Vital signs reviewed and stable and Patient moving all extremities  Post vital signs: Reviewed and stable  Complications: No apparent anesthesia complications

## 2013-12-19 NOTE — CV Procedure (Signed)
   Doris Jefferson 12/19/2013, 12:20 PM  Electrical Cardioversion Procedure Note Doris Jefferson [id number] [birth year]  Procedure: Electrical Cardioversion Indications:  Atrial Fibrillation  Time Out: Verified patient identification, verified procedure,medications/allergies/relevent history reviewed, required imaging and test results available.  Performed  Procedure Details  The patient was NPO after midnight. Anesthesia was administered at the beside  by Dr.Singer with 70 mg of propofol.  Cardioversion was done with synchronized biphasic defibrillation with AP pads with  200 watts.  The patient converted to normal sinus rhythm. The patient tolerated the procedure well   IMPRESSION:  Successful cardioversion of atrial fibrillation    Olen Pel W 12/19/2013, 12:21 PM

## 2013-12-19 NOTE — Anesthesia Procedure Notes (Signed)
Date/Time: 12/19/2013 12:28 PM Performed by: Carola Frost Pre-anesthesia Checklist: Patient identified, Emergency Drugs available, Suction available, Patient being monitored and Timeout performed Patient Re-evaluated:Patient Re-evaluated prior to inductionOxygen Delivery Method: Ambu bag Preoxygenation: Pre-oxygenation with 100% oxygen Intubation Type: IV induction Ventilation: Mask ventilation without difficulty Placement Confirmation: breath sounds checked- equal and bilateral Dental Injury: Teeth and Oropharynx as per pre-operative assessment

## 2013-12-22 ENCOUNTER — Telehealth: Payer: Self-pay

## 2013-12-22 ENCOUNTER — Encounter (HOSPITAL_COMMUNITY): Payer: Self-pay | Admitting: Interventional Cardiology

## 2013-12-23 ENCOUNTER — Telehealth: Payer: Self-pay | Admitting: Cardiology

## 2013-12-23 NOTE — Telephone Encounter (Signed)
Please let patient know that she had successful CPAP titration and set up OV with me in 10 weeks 

## 2013-12-24 NOTE — Telephone Encounter (Signed)
Pt is aware and set pt up for 10 week sleep f/u in office with Dr Radford Pax

## 2013-12-25 NOTE — Telephone Encounter (Signed)
error 

## 2013-12-26 ENCOUNTER — Other Ambulatory Visit (INDEPENDENT_AMBULATORY_CARE_PROVIDER_SITE_OTHER): Payer: Medicare Other

## 2013-12-26 DIAGNOSIS — Z79899 Other long term (current) drug therapy: Secondary | ICD-10-CM

## 2013-12-26 LAB — CBC WITH DIFFERENTIAL/PLATELET
Basophils Absolute: 0 10*3/uL (ref 0.0–0.1)
Basophils Relative: 0.6 % (ref 0.0–3.0)
EOS PCT: 2.6 % (ref 0.0–5.0)
Eosinophils Absolute: 0.2 10*3/uL (ref 0.0–0.7)
HCT: 33.5 % — ABNORMAL LOW (ref 36.0–46.0)
Hemoglobin: 10.6 g/dL — ABNORMAL LOW (ref 12.0–15.0)
Lymphocytes Relative: 25.7 % (ref 12.0–46.0)
Lymphs Abs: 2.3 10*3/uL (ref 0.7–4.0)
MCHC: 31.7 g/dL (ref 30.0–36.0)
MCV: 82.6 fl (ref 78.0–100.0)
Monocytes Absolute: 0.6 10*3/uL (ref 0.1–1.0)
Monocytes Relative: 6.6 % (ref 3.0–12.0)
NEUTROS PCT: 64.5 % (ref 43.0–77.0)
Neutro Abs: 5.7 10*3/uL (ref 1.4–7.7)
Platelets: 224 10*3/uL (ref 150.0–400.0)
RBC: 4.05 Mil/uL (ref 3.87–5.11)
RDW: 16 % — AB (ref 11.5–14.6)
WBC: 8.9 10*3/uL (ref 4.5–10.5)

## 2013-12-30 ENCOUNTER — Telehealth: Payer: Self-pay | Admitting: *Deleted

## 2013-12-30 NOTE — Telephone Encounter (Signed)
pt notified about lab results with verbal understanding  

## 2013-12-31 ENCOUNTER — Encounter: Payer: Self-pay | Admitting: Physician Assistant

## 2013-12-31 ENCOUNTER — Ambulatory Visit (INDEPENDENT_AMBULATORY_CARE_PROVIDER_SITE_OTHER): Payer: Medicare Other | Admitting: Physician Assistant

## 2013-12-31 VITALS — BP 140/70 | HR 75 | Ht 63.0 in | Wt 214.0 lb

## 2013-12-31 DIAGNOSIS — I350 Nonrheumatic aortic (valve) stenosis: Secondary | ICD-10-CM

## 2013-12-31 DIAGNOSIS — Z5181 Encounter for therapeutic drug level monitoring: Secondary | ICD-10-CM

## 2013-12-31 DIAGNOSIS — I4891 Unspecified atrial fibrillation: Secondary | ICD-10-CM

## 2013-12-31 DIAGNOSIS — I10 Essential (primary) hypertension: Secondary | ICD-10-CM

## 2013-12-31 DIAGNOSIS — Z79899 Other long term (current) drug therapy: Secondary | ICD-10-CM

## 2013-12-31 DIAGNOSIS — G4733 Obstructive sleep apnea (adult) (pediatric): Secondary | ICD-10-CM | POA: Insufficient documentation

## 2013-12-31 DIAGNOSIS — I359 Nonrheumatic aortic valve disorder, unspecified: Secondary | ICD-10-CM

## 2013-12-31 NOTE — Progress Notes (Signed)
[facility], [place], [place]  [address] Phone: [phone] Fax:  [phone]  Date:  12/31/2013   ID:  Doris Jefferson, DOB 03/10/1941, MRN [id number]  PCP:  Stephens Shire, MD  Cardiologist:  Dr. Daneen Schick     History of Present Illness: Doris Jefferson is a 73 y.o. female with a hx of mild AS, pulmonary HTN, DM, HTN.  She was recently noted to be in AFib with RVR. Rhythm control strategy was chosen.  She was placed on Amiodarone and Eliquis.  She was brought back for DCCV after an appropriate period of uninterrupted anticoagulation.  This was performed on 12/19/13 and was successful.  She returns for follow up. She is doing well. The patient denies chest pain, shortness of breath, syncope, orthopnea, PND or significant pedal edema.  No palpitations.   Studies:  - Echo (10/2013):  EF 55-60%, no RWMA, MAC, mild LAE, mild RVE, mild TR, PASP 53 (mod pulmo HTN).     Recent Labs: 11/10/2013: Pro B Natriuretic peptide (BNP) 154.0*  12/09/2013: Creatinine 0.7  12/19/2013: Potassium 4.1  12/26/2013: Hemoglobin 10.6*   Wt Readings from Last 3 Encounters:  12/31/13 214 lb (97.07 kg)  12/09/13 223 lb (101.152 kg)  11/17/13 221 lb (100.245 kg)     Past Medical History  Diagnosis Date  . Heart murmur   . HTN (hypertension)   . Diabetes   . Hyperlipidemia     Current Outpatient Prescriptions  Medication Sig Dispense Refill  . amiodarone (PACERONE) 200 MG tablet Take 1 tablet (200 mg total) by mouth daily.      [name] apixaban (ELIQUIS) 5 MG TABS tablet Take 1 tablet (5 mg total) by mouth 2 (two) times daily.  60 tablet  11  . benazepril (LOTENSIN) 20 MG tablet Take 20 mg by mouth daily.      [name] diltiazem (CARDIZEM CD) 240 MG 24 hr capsule Take 1 capsule (240 mg total) by mouth daily.  30 capsule  11  . exenatide (BYETTA) 10 MCG/0.04ML SOPN injection Inject 10 mcg into the skin 2 (two) times daily with a meal.      . glimepiride (AMARYL) 4 MG tablet Take 4 mg by mouth daily with  breakfast.      . metFORMIN (GLUCOPHAGE) 1000 MG tablet Take 1,000 mg by mouth 2 (two) times daily with a meal.      . Omega-3 Fatty Acids (FISH OIL) 1000 MG CAPS Take 1 capsule by mouth daily.      [name] PARoxetine (PAXIL) 20 MG tablet Take 20 mg by mouth daily.      . rosuvastatin (CRESTOR) 20 MG tablet Take 20 mg by mouth daily.      [name] terazosin (HYTRIN) 5 MG capsule Take 5 mg by mouth at bedtime.       No current facility-administered medications for this visit.    Allergies:   Minocin   Social History:  The patient  reports that she has never smoked. She does not have any smokeless tobacco history on file. She reports that she does not drink alcohol or use illicit drugs.   Family History:  The patient's family history includes Heart disease in her father; Hypertension in her brother and father; Melanoma in her mother.   ROS:  Please see the history of present illness.   No bleeding problems.   All other systems reviewed and negative.   PHYSICAL EXAM: VS:  BP 140/70  Pulse 75  Ht 5'  3" (1.6 m)  Wt 214 lb (97.07 kg)  BMI 37.92 kg/m2 Well nourished, well developed, in no acute distress HEENT: normal Neck: no JVD Cardiac:  normal S1, S2; RRR; 1/6 systolic murmurRUSB Lungs:  clear to auscultation bilaterally, no wheezing, rhonchi or rales Abd: soft, nontender, no hepatomegaly Ext: no bilateral LE edema Skin: warm and dry Neuro:  CNs 2-12 intact, no focal abnormalities noted  EKG:   NSR, HR 75, LAD, prominent U wave, prolonged QT     ASSESSMENT AND PLAN:  1. Persistent Atrial Fibrillation:  Maintaining NSR on Amiodarone.  She is tolerating Eliquis.  Her Hgb has been somewhat depressed but stable. No bleeding symptoms.  She will continue on current Rx.  Check LFTs, TSH today and arrange PFTs with [facility].  Arrange f/u BMET and CBC in 1 month.  2. Hypertension:  Controlled.  3. Diabetes Mellitus:  F/u with primary care.  4. Pulmonary HTN:  Recent sleep study indicative of severe  OSA. 5. Sleep Apnea:  She has recently started CPAP.  6. Mild Aortic Stenosis:  No evidence of AS on recent echo. 7. Disposition:  F/u with Dr. Daneen Schick in 3 mos.   Signed, Richardson Dopp, PA-C  12/31/2013 3:50 PM

## 2013-12-31 NOTE — Patient Instructions (Signed)
LAB WORK TODAY; TSH. LFT    YOU WILL NEED LAB WORK IN 1 MONTH FOR A BMET, CBC W/DIFF  Your physician has recommended that you have a pulmonary function test. Pulmonary Function Tests are a group of tests that measure how well air moves in and out of your lungs.  Your physician recommends that you schedule a follow-up appointment in: 3 MONTHS WITH DR. Tamala Julian  WE WILL SEND IN A PRIOR AUTHORIZATION FOR YOUR ELIQUIS

## 2014-01-01 ENCOUNTER — Telehealth: Payer: Self-pay | Admitting: *Deleted

## 2014-01-01 LAB — TSH: TSH: 1.49 u[IU]/mL (ref 0.35–5.50)

## 2014-01-01 LAB — HEPATIC FUNCTION PANEL
ALBUMIN: 4.1 g/dL (ref 3.5–5.2)
ALT: 30 U/L (ref 0–35)
AST: 29 U/L (ref 0–37)
Alkaline Phosphatase: 54 U/L (ref 39–117)
BILIRUBIN TOTAL: 0.3 mg/dL (ref 0.3–1.2)
Bilirubin, Direct: 0.1 mg/dL (ref 0.0–0.3)
Total Protein: 6.8 g/dL (ref 6.0–8.3)

## 2014-01-01 NOTE — Telephone Encounter (Signed)
lmptcb about her PFT scheduled at [facility]. As stated in phone note per Grayland Ormond to contact him directly @ [phone] .

## 2014-01-01 NOTE — Telephone Encounter (Signed)
lmom instructions about her PFT 01/08/14 and if any questions to call Revonda Humphrey at [phone] directly.

## 2014-01-05 ENCOUNTER — Telehealth: Payer: Self-pay | Admitting: *Deleted

## 2014-01-05 NOTE — Telephone Encounter (Signed)
Eliquis approved through 01/06/2015 by Stark Jock, Kinde # [id number]

## 2014-01-05 NOTE — Telephone Encounter (Signed)
PA for ELIQUIS  Faxed to [facility]

## 2014-01-08 ENCOUNTER — Encounter (HOSPITAL_COMMUNITY): Payer: Medicare Other

## 2014-01-28 ENCOUNTER — Encounter: Payer: Self-pay | Admitting: Cardiology

## 2014-01-30 ENCOUNTER — Other Ambulatory Visit (INDEPENDENT_AMBULATORY_CARE_PROVIDER_SITE_OTHER): Payer: Medicare Other

## 2014-01-30 DIAGNOSIS — I4891 Unspecified atrial fibrillation: Secondary | ICD-10-CM

## 2014-01-30 LAB — BASIC METABOLIC PANEL
BUN: 16 mg/dL (ref 6–23)
CALCIUM: 9.8 mg/dL (ref 8.4–10.5)
CO2: 26 mEq/L (ref 19–32)
CREATININE: 0.8 mg/dL (ref 0.4–1.2)
Chloride: 103 mEq/L (ref 96–112)
GFR: 79.26 mL/min (ref >60.00)
Glucose, Bld: 138 mg/dL — ABNORMAL HIGH (ref 70–99)
Potassium: 4.4 mEq/L (ref 3.5–5.1)
Sodium: 138 mEq/L (ref 135–145)

## 2014-02-05 ENCOUNTER — Ambulatory Visit (INDEPENDENT_AMBULATORY_CARE_PROVIDER_SITE_OTHER): Payer: Medicare Other | Admitting: Internal Medicine

## 2014-02-05 ENCOUNTER — Telehealth: Payer: Self-pay

## 2014-02-05 DIAGNOSIS — Z5181 Encounter for therapeutic drug level monitoring: Secondary | ICD-10-CM

## 2014-02-05 DIAGNOSIS — Z79899 Other long term (current) drug therapy: Secondary | ICD-10-CM

## 2014-02-05 NOTE — Telephone Encounter (Signed)
Pt aware labs are ok.pt verbalized understanding

## 2014-02-05 NOTE — Telephone Encounter (Signed)
lmom for pt to call back.calle to give pt lab results.

## 2014-02-05 NOTE — Telephone Encounter (Signed)
Message copied by Lamar Laundry on Thu Feb 05, 2014  9:57 AM ------      Message from: Daneen Schick      Created: Mon Feb 02, 2014  8:11 PM       Lab is okay ------

## 2014-02-05 NOTE — Progress Notes (Signed)
PFT done today. 

## 2014-02-05 NOTE — Telephone Encounter (Signed)
Message copied by Lamar Laundry on Thu Feb 05, 2014  8:50 AM ------      Message from: Daneen Schick      Created: Mon Feb 02, 2014  8:11 PM       Lab is okay ------

## 2014-02-08 LAB — PULMONARY FUNCTION TEST
DL/VA % pred: 102 %
DL/VA: 4.68 ml/min/mmHg/L
DLCO UNC % PRED: 92 %
DLCO unc: 19.92 ml/min/mmHg
FEF 25-75 Post: 2.55 L/sec
FEF 25-75 Pre: 1.75 L/sec
FEF2575-%Change-Post: 45 %
FEF2575-%Pred-Post: 155 %
FEF2575-%Pred-Pre: 106 %
FEV1-%Change-Post: 12 %
FEV1-%PRED-POST: 111 %
FEV1-%Pred-Pre: 99 %
FEV1-Post: 2.2 L
FEV1-Pre: 1.97 L
FEV1FVC-%CHANGE-POST: 4 %
FEV1FVC-%Pred-Pre: 106 %
FEV6-%Change-Post: 8 %
FEV6-%PRED-PRE: 97 %
FEV6-%Pred-Post: 105 %
FEV6-PRE: 2.46 L
FEV6-Post: 2.66 L
FEV6FVC-%Pred-Post: 105 %
FEV6FVC-%Pred-Pre: 105 %
FVC-%Change-Post: 7 %
FVC-%Pred-Post: 100 %
FVC-%Pred-Pre: 93 %
FVC-PRE: 2.47 L
FVC-Post: 2.66 L
PRE FEV1/FVC RATIO: 80 %
PRE FEV6/FVC RATIO: 100 %
Post FEV1/FVC ratio: 83 %
Post FEV6/FVC ratio: 100 %
RV % PRED: 107 %
RV: 2.32 L
TLC % pred: 112 %
TLC: 5.35 L

## 2014-02-09 ENCOUNTER — Telehealth: Payer: Self-pay | Admitting: Physician Assistant

## 2014-02-09 NOTE — Telephone Encounter (Signed)
pt notified about PFT results with verbal understanding, ,pt said thank you

## 2014-02-09 NOTE — Telephone Encounter (Signed)
°

## 2014-02-10 ENCOUNTER — Encounter: Payer: Self-pay | Admitting: General Surgery

## 2014-02-24 ENCOUNTER — Encounter: Payer: Self-pay | Admitting: Interventional Cardiology

## 2014-03-04 ENCOUNTER — Encounter: Payer: Self-pay | Admitting: Cardiology

## 2014-03-04 ENCOUNTER — Ambulatory Visit (INDEPENDENT_AMBULATORY_CARE_PROVIDER_SITE_OTHER): Payer: Medicare Other | Admitting: Cardiology

## 2014-03-04 VITALS — BP 162/58 | HR 72 | Ht 63.0 in | Wt 222.0 lb

## 2014-03-04 DIAGNOSIS — G4733 Obstructive sleep apnea (adult) (pediatric): Secondary | ICD-10-CM

## 2014-03-04 DIAGNOSIS — E669 Obesity, unspecified: Secondary | ICD-10-CM

## 2014-03-04 DIAGNOSIS — I10 Essential (primary) hypertension: Secondary | ICD-10-CM

## 2014-03-04 HISTORY — DX: Obesity, unspecified: E66.9

## 2014-03-04 NOTE — Patient Instructions (Signed)
Your physician recommends that you continue on your current medications as directed. Please refer to the Current Medication list given to you today.  Your physician wants you to follow-up in: 6 months with Dr Turner You will receive a reminder letter in the mail two months in advance. If you don't receive a letter, please call our office to schedule the follow-up appointment.  

## 2014-03-04 NOTE — Progress Notes (Signed)
[place], [place], [place]  [address] Phone: [phone] Fax:  [phone]  Date:  03/04/2014   ID:  Doris Jefferson, DOB 09/24/1941, MRN [id number]  PCP:  Stephens Shire, MD  Cardiologist:  Fransico Him, MD     History of Present Illness: Doris Jefferson is a 73 y.o. female with a history of PAF, pulmonary HTN and HTN who recently underwent PSG showing severe OSA with an AHI of 31/hr and underwent CPAP titration to 14cm H2O.  She is now here for followup.  She says that she did not know that she was tired during the day with nonrestorative sleep until she got on CPAP and feel much better.  She is doing well on her CPAP device.  She tolerates her nasal pillow mask and feels the pressure is adequate.  She feels rested in the am and has no daytime sleepiness.   Wt Readings from Last 3 Encounters:  03/04/14 222 lb (100.699 kg)  12/31/13 214 lb (97.07 kg)  12/09/13 223 lb (101.152 kg)     Past Medical History  Diagnosis Date  . Heart murmur   . Diabetes   . Hyperlipidemia   . OSA (obstructive sleep apnea)     severe with AHI 31/hr  . HTN (hypertension)   . PAF (paroxysmal atrial fibrillation)     Current Outpatient Prescriptions  Medication Sig Dispense Refill  . amiodarone (PACERONE) 200 MG tablet Take 1 tablet (200 mg total) by mouth daily.      [name] apixaban (ELIQUIS) 5 MG TABS tablet Take 1 tablet (5 mg total) by mouth 2 (two) times daily.  60 tablet  11  . benazepril (LOTENSIN) 20 MG tablet Take 20 mg by mouth daily.      [name] diltiazem (CARDIZEM CD) 240 MG 24 hr capsule Take 1 capsule (240 mg total) by mouth daily.  30 capsule  11  . exenatide (BYETTA) 10 MCG/0.04ML SOPN injection Inject 10 mcg into the skin 2 (two) times daily with a meal.      . glimepiride (AMARYL) 4 MG tablet Take 4 mg by mouth daily with breakfast.      . metFORMIN (GLUCOPHAGE) 1000 MG tablet Take 1,000 mg by mouth 2 (two) times daily with a meal.      . Omega-3 Fatty Acids (FISH OIL) 1000 MG  CAPS Take 1 capsule by mouth daily.      [name] PARoxetine (PAXIL) 20 MG tablet Take 20 mg by mouth daily.      . rosuvastatin (CRESTOR) 20 MG tablet Take 20 mg by mouth daily.      [name] terazosin (HYTRIN) 5 MG capsule Take 5 mg by mouth at bedtime.       No current facility-administered medications for this visit.    Allergies:    Allergies  Allergen Reactions  . Minocin [Minocycline Hcl] Other (See Comments)    Throat swelling    Social History:  The patient  reports that she has never smoked. She does not have any smokeless tobacco history on file. She reports that she does not drink alcohol or use illicit drugs.   Family History:  The patient's family history includes Heart disease in her father; Hypertension in her brother and father; Melanoma in her mother.   ROS:  Please see the history of present illness.      All other systems reviewed and negative.   PHYSICAL EXAM: VS:  BP 162/58  Pulse 72  Ht 5\' 3"  (  1.6 m)  Wt 222 lb (100.699 kg)  BMI 39.34 kg/m2 Well nourished, well developed, in no acute distress HEENT: normal Neck: no JVD Cardiac:  normal S1, S2; RRR; 2/6 SM at RUSB Lungs:  clear to auscultation bilaterally, no wheezing, rhonchi or rales Abd: soft, nontender, no hepatomegaly Ext: no edema Skin: warm and dry Neuro:  CNs 2-12 intact, no focal abnormalities noted       ASSESSMENT AND PLAN:  1. Severe OSA on CPAP - her most recent download showed an AHI of 1.4/hr and 93% compliance in using more than 4 hour nigtly. - continue current pressure settings 2. HTN elevated today - I have asked her to check her BP daily for a week and call with the results 3. Obesity - she has started silver sneakers and I have encouraged her to follow a strict diet and watch her portions.   Followup with me in 6 months  Signed, Fransico Him, MD 03/04/2014 9:25 AM

## 2014-03-31 ENCOUNTER — Encounter: Payer: Self-pay | Admitting: Interventional Cardiology

## 2014-03-31 ENCOUNTER — Ambulatory Visit (INDEPENDENT_AMBULATORY_CARE_PROVIDER_SITE_OTHER): Payer: Medicare Other | Admitting: Interventional Cardiology

## 2014-03-31 VITALS — BP 145/60 | HR 78 | Ht 63.0 in | Wt 221.0 lb

## 2014-03-31 DIAGNOSIS — Z7901 Long term (current) use of anticoagulants: Secondary | ICD-10-CM

## 2014-03-31 DIAGNOSIS — Z5181 Encounter for therapeutic drug level monitoring: Secondary | ICD-10-CM

## 2014-03-31 DIAGNOSIS — I359 Nonrheumatic aortic valve disorder, unspecified: Secondary | ICD-10-CM

## 2014-03-31 DIAGNOSIS — I272 Pulmonary hypertension, unspecified: Secondary | ICD-10-CM

## 2014-03-31 DIAGNOSIS — I2789 Other specified pulmonary heart diseases: Secondary | ICD-10-CM

## 2014-03-31 DIAGNOSIS — I482 Chronic atrial fibrillation, unspecified: Secondary | ICD-10-CM

## 2014-03-31 DIAGNOSIS — I350 Nonrheumatic aortic (valve) stenosis: Secondary | ICD-10-CM

## 2014-03-31 DIAGNOSIS — I4891 Unspecified atrial fibrillation: Secondary | ICD-10-CM

## 2014-03-31 DIAGNOSIS — G4733 Obstructive sleep apnea (adult) (pediatric): Secondary | ICD-10-CM

## 2014-03-31 NOTE — Patient Instructions (Addendum)
Your physician has recommended you make the following change in your medication:  1) STOP Amiodarone  Take all other medications as prescribed  Your physician has recommended that you wear a holter monitor. Holter monitors are medical devices that record the heart's electrical activity. Doctors most often use these monitors to diagnose arrhythmias. Arrhythmias are problems with the speed or rhythm of the heartbeat. The monitor is a small, portable device. You can wear one while you do your normal daily activities. This is usually used to diagnose what is causing palpitations/syncope (passing out).( To be scheduled for August 2015)  Your physician recommends that you schedule a follow-up appointment in: 3 months

## 2014-03-31 NOTE — Progress Notes (Signed)
Patient ID: Doris Jefferson, female   DOB: 06-Nov-1940, 73 y.o.   MRN: [id number]    [address] [place], [place]  [address] Phone: [phone] Fax:  [phone]  Date:  03/31/2014   ID:  Doris Jefferson, DOB [birth year], MRN [id number]  PCP:  Stephens Shire, MD   ASSESSMENT:  1. Newly diagnosed sleep apnea, not treated 2. Persistent atrial fibrillation, maintaining normal sinus rhythm after cardioversion on amiodarone. A. fib was likely incited by sleep apnea 3. Hypertension 4. Chronic anticoagulation  PLAN:  1. Now that sleep apnea is treated, we will discontinue amiodarone. 2. 2 month 48 hour Holter 3. Clinical followup in 3 months 4. If no documentation of recurrent A. fib consider switching chronic anticoagulation to aspirin   SUBJECTIVE: Doris Jefferson is a 73 y.o. female who is doing well. She feels much stronger. Sleep apnea was identified and treated. She feels much better than she has in quite some time.   Wt Readings from Last 3 Encounters:  03/31/14 221 lb (100.245 kg)  03/04/14 222 lb (100.699 kg)  12/31/13 214 lb (97.07 kg)     Past Medical History  Diagnosis Date  . Heart murmur   . Diabetes   . Hyperlipidemia   . OSA (obstructive sleep apnea)     severe with AHI 31/hr  . HTN (hypertension)   . PAF (paroxysmal atrial fibrillation)   . Obesity (BMI 30-39.9) 03/04/2014    Current Outpatient Prescriptions  Medication Sig Dispense Refill  . amiodarone (PACERONE) 200 MG tablet Take 1 tablet (200 mg total) by mouth daily.      [name] apixaban (ELIQUIS) 5 MG TABS tablet Take 1 tablet (5 mg total) by mouth 2 (two) times daily.  60 tablet  11  . benazepril (LOTENSIN) 20 MG tablet Take 20 mg by mouth daily.      [name] diltiazem (CARDIZEM CD) 240 MG 24 hr capsule Take 1 capsule (240 mg total) by mouth daily.  30 capsule  11  . exenatide (BYETTA) 10 MCG/0.04ML SOPN injection Inject 10 mcg into the skin 2 (two) times daily with a meal.      . glimepiride  (AMARYL) 4 MG tablet Take 4 mg by mouth daily with breakfast.      . metFORMIN (GLUCOPHAGE) 1000 MG tablet Take 1,000 mg by mouth 2 (two) times daily with a meal.      . Omega-3 Fatty Acids (FISH OIL) 1000 MG CAPS Take 1 capsule by mouth daily.      [name] PARoxetine (PAXIL) 20 MG tablet Take 20 mg by mouth daily.      . rosuvastatin (CRESTOR) 20 MG tablet Take 20 mg by mouth daily.      [name] terazosin (HYTRIN) 5 MG capsule Take 5 mg by mouth at bedtime.       No current facility-administered medications for this visit.    Allergies:    Allergies  Allergen Reactions  . Minocin [Minocycline Hcl] Other (See Comments)    Throat swelling    Social History:  The patient  reports that she has never smoked. She does not have any smokeless tobacco history on file. She reports that she does not drink alcohol or use illicit drugs.   ROS:  Please see the history of present illness.   No blood in urine or stool   All other systems reviewed and negative.   OBJECTIVE: VS:  BP 145/60  Pulse 78  Ht 5\' 3"  (1.6 m)  Wt 221 lb (100.245 kg)  BMI 39.16 kg/m2  SpO2 95% Well nourished, well developed, in no acute distress, obese HEENT: normal Neck: JVD flat. Carotid bruit absent  Cardiac:  normal S1, S2; RRR; no murmur Lungs:  clear to auscultation bilaterally, no wheezing, rhonchi or rales Abd: soft, nontender, no hepatomegaly Ext: Edema absent. Pulses 2+ Skin: warm and dry Neuro:  CNs 2-12 intact, no focal abnormalities noted  EKG:  Not performed       Signed, Illene Labrador III, MD 03/31/2014 9:40 AM

## 2014-04-06 ENCOUNTER — Ambulatory Visit: Payer: Medicare Other | Admitting: Interventional Cardiology

## 2014-05-18 ENCOUNTER — Encounter (INDEPENDENT_AMBULATORY_CARE_PROVIDER_SITE_OTHER): Payer: Medicare Other

## 2014-05-18 ENCOUNTER — Encounter: Payer: Self-pay | Admitting: *Deleted

## 2014-05-18 DIAGNOSIS — I482 Chronic atrial fibrillation, unspecified: Secondary | ICD-10-CM

## 2014-05-18 DIAGNOSIS — I4891 Unspecified atrial fibrillation: Secondary | ICD-10-CM

## 2014-05-18 NOTE — Progress Notes (Signed)
Patient ID: Doris Jefferson, female   DOB: October 05, 1941, 73 y.o.   MRN: [id number] E-Cardio 48 hour holter monitor applied to patient.

## 2014-06-10 ENCOUNTER — Telehealth: Payer: Self-pay

## 2014-06-10 NOTE — Telephone Encounter (Signed)
pt aware of holter monitor results -No AF -Normal pt verbalized understanding

## 2014-06-16 ENCOUNTER — Other Ambulatory Visit: Payer: Self-pay

## 2014-06-16 DIAGNOSIS — Z1231 Encounter for screening mammogram for malignant neoplasm of breast: Secondary | ICD-10-CM

## 2014-06-23 ENCOUNTER — Encounter: Payer: Self-pay | Admitting: Interventional Cardiology

## 2014-06-23 ENCOUNTER — Ambulatory Visit (INDEPENDENT_AMBULATORY_CARE_PROVIDER_SITE_OTHER): Payer: Medicare Other | Admitting: Interventional Cardiology

## 2014-06-23 VITALS — BP 160/40 | HR 84 | Ht 63.0 in | Wt 228.0 lb

## 2014-06-23 DIAGNOSIS — I10 Essential (primary) hypertension: Secondary | ICD-10-CM

## 2014-06-23 DIAGNOSIS — I4819 Other persistent atrial fibrillation: Secondary | ICD-10-CM | POA: Insufficient documentation

## 2014-06-23 DIAGNOSIS — I4891 Unspecified atrial fibrillation: Secondary | ICD-10-CM

## 2014-06-23 DIAGNOSIS — G4733 Obstructive sleep apnea (adult) (pediatric): Secondary | ICD-10-CM

## 2014-06-23 DIAGNOSIS — Z5181 Encounter for therapeutic drug level monitoring: Secondary | ICD-10-CM

## 2014-06-23 DIAGNOSIS — Z7901 Long term (current) use of anticoagulants: Secondary | ICD-10-CM

## 2014-06-23 DIAGNOSIS — I480 Paroxysmal atrial fibrillation: Secondary | ICD-10-CM

## 2014-06-23 MED ORDER — ASPIRIN EC 81 MG PO TBEC: 81.0000 mg | DELAYED_RELEASE_TABLET | Freq: Every day | ORAL | Status: DC

## 2014-06-23 NOTE — Progress Notes (Signed)
Patient ID: Doris Jefferson, female   DOB: [birth year], 73 y.o.   MRN: [id number]    [address], [address] [place], [place]  [address] Phone: [phone] Fax:  [phone]  Date:  06/23/2014   ID:  Doris Jefferson, DOB 29-Oct-1940, MRN [id number]  PCP:  Stephens Shire, MD   ASSESSMENT:  1. paroxysmal atrial fibrillation, without recurrence since treating sleep apnea 2. Obstructive sleep apnea, not treated 3. Chronic anticoagulation with Eliquis 4. Hypertension  PLAN:  1. Monitoring after management of sleep apnea did not reveal any recurrences of atrial fibrillation. The monitor was done with the patient off amiodarone therapy. No clinical relapses since that time 2. Discontinue anticoagulation and start aspirin 81 mg per day 3. Clinical followup in 6 months with an EKG   SUBJECTIVE: Doris Jefferson is a 73 y.o. female who is doing well and has no cardiopulmonary complaints. She is now treated with a CPAP mask for sleep apnea. Cardiac monitoring done 2 months after amiodarone was discontinued and after initiation of therapy for sleep apnea. She has had no relapses, palpitations, or clinical evidence of atrial fibrillation. Overall she feels markedly improved. More energy and less dyspnea .   Wt Readings from Last 3 Encounters:  06/23/14 228 lb (103.42 kg)  03/31/14 221 lb (100.245 kg)  03/04/14 222 lb (100.699 kg)     Past Medical History  Diagnosis Date  . Heart murmur   . Diabetes   . Hyperlipidemia   . OSA (obstructive sleep apnea)     severe with AHI 31/hr  . HTN (hypertension)   . PAF (paroxysmal atrial fibrillation)   . Obesity (BMI 30-39.9) 03/04/2014    Current Outpatient Prescriptions  Medication Sig Dispense Refill  . apixaban (ELIQUIS) 5 MG TABS tablet Take 1 tablet (5 mg total) by mouth 2 (two) times daily.  60 tablet  11  . benazepril (LOTENSIN) 20 MG tablet Take 20 mg by mouth daily.      [name] diltiazem (CARDIZEM CD) 240 MG 24 hr capsule Take 1 capsule (240  mg total) by mouth daily.  30 capsule  11  . exenatide (BYETTA) 10 MCG/0.04ML SOPN injection Inject 10 mcg into the skin 2 (two) times daily with a meal.      . glimepiride (AMARYL) 4 MG tablet Take 4 mg by mouth daily with breakfast.      . metFORMIN (GLUCOPHAGE) 1000 MG tablet Take 1,000 mg by mouth 2 (two) times daily with a meal.      . Omega-3 Fatty Acids (FISH OIL) 1000 MG CAPS Take 1 capsule by mouth daily.      [name] PARoxetine (PAXIL) 20 MG tablet Take 20 mg by mouth daily.      . penicillin v potassium (VEETID) 500 MG tablet       . rosuvastatin (CRESTOR) 20 MG tablet Take 20 mg by mouth daily.      [name] terazosin (HYTRIN) 5 MG capsule Take 5 mg by mouth at bedtime.      . terbinafine (LAMISIL) 250 MG tablet        No current facility-administered medications for this visit.    Allergies:    Allergies  Allergen Reactions  . Minocin [Minocycline Hcl] Other (See Comments)    Throat swelling    Social History:  The patient  reports that she has never smoked. She does not have any smokeless tobacco history on file. She reports that she does not drink alcohol or use  illicit drugs.   ROS:  Please see the history of present illness.    appetite stable no blood in urine or stool   All other systems reviewed and negative.   OBJECTIVE: VS:  BP 160/40  Pulse 84  Ht 5\' 3"  (1.6 m)  Wt 228 lb (103.42 kg)  BMI 40.40 kg/m2 Well nourished, well developed, in no acute distress,  obese  HEENT: normal Neck: JVD  flat . Carotid bruit  absent   Cardiac:  normal S1, S2; RRR; no murmur Lungs:  clear to auscultation bilaterally, no wheezing, rhonchi or rales Abd: soft, nontender, no hepatomegaly Ext: Edema  absent. Pulses  2+ and symmetric Skin: warm and dry Neuro:  CNs 2-12 intact, no focal abnormalities noted  EKG:   not performed       Signed, Illene Labrador III, MD 06/23/2014 10:00 AM

## 2014-06-23 NOTE — Patient Instructions (Addendum)
Your physician has recommended you make the following change in your medication:  1) STOP Eliquis. Take your last dose on Wed 06/24/14 2) START Aspirin 81mg  after your dental procedure, when your dentist thinks it is safe  Ok to proceed with your dental procedure with Dr.Briggs on 06/26/14  Your physician wants you to follow-up in: 6 months with an EKG You will receive a reminder letter in the mail two months in advance. If you don't receive a letter, please call our office to schedule the follow-up appointment.

## 2014-07-16 ENCOUNTER — Ambulatory Visit: Payer: Medicare Other

## 2014-07-23 ENCOUNTER — Ambulatory Visit
Admission: RE | Admit: 2014-07-23 | Discharge: 2014-07-23 | Disposition: A | Discharge status: Home / Self Care | Payer: Medicare Other | Source: Ambulatory Visit

## 2014-07-23 DIAGNOSIS — Z1231 Encounter for screening mammogram for malignant neoplasm of breast: Secondary | ICD-10-CM

## 2014-09-01 ENCOUNTER — Encounter: Payer: Self-pay | Admitting: Cardiology

## 2014-09-01 ENCOUNTER — Ambulatory Visit (INDEPENDENT_AMBULATORY_CARE_PROVIDER_SITE_OTHER): Payer: Medicare Other | Admitting: Cardiology

## 2014-09-01 DIAGNOSIS — G4733 Obstructive sleep apnea (adult) (pediatric): Secondary | ICD-10-CM

## 2014-09-01 DIAGNOSIS — I10 Essential (primary) hypertension: Secondary | ICD-10-CM

## 2014-09-01 DIAGNOSIS — E669 Obesity, unspecified: Secondary | ICD-10-CM

## 2014-09-01 DIAGNOSIS — E119 Type 2 diabetes mellitus without complications: Secondary | ICD-10-CM

## 2014-09-01 NOTE — Patient Instructions (Signed)
Your physician has recommended you make the following change in your medication:  1) STOP Lamisil   Your physician wants you to follow-up in: 6 months with Dr. Radford Pax. You will receive a reminder letter in the mail two months in advance. If you don't receive a letter, please call our office to schedule the follow-up appointment.

## 2014-09-01 NOTE — Progress Notes (Addendum)
[place], [place], [place]  [address] Phone: [phone] Fax:  [phone]  Date:  09/01/2014   ID:  Doris Jefferson, DOB 1941-07-25, MRN [id number]  PCP:  Stephens Shire, MD  Cardiologist:  Fransico Him, MD    History of Present Illness: Doris Jefferson is a 73 y.o. female with a history of PAF, pulmonary HTN, HTN, obesity and OSA on CPAP. She is doing well on her CPAP device. She tolerates her nasal pillow mask and chin strap and feels the pressure is adequate. She says that her mouth gets dry at night.  She feels rested in the am and has no daytime sleepiness.  She does not snore according to her husband.  She goes to yoga for exercise.   Wt Readings from Last 3 Encounters:  09/01/14 221 lb 12.8 oz (100.608 kg)  06/23/14 228 lb (103.42 kg)  03/31/14 221 lb (100.245 kg)     Past Medical History  Diagnosis Date  . Heart murmur   . Diabetes   . Hyperlipidemia   . OSA (obstructive sleep apnea)     severe with AHI 31/hr  . HTN (hypertension)   . PAF (paroxysmal atrial fibrillation)   . Obesity (BMI 30-39.9) 03/04/2014    Current Outpatient Prescriptions  Medication Sig Dispense Refill  . aspirin EC 81 MG tablet Take 1 tablet (81 mg total) by mouth daily.    . benazepril (LOTENSIN) 20 MG tablet Take 20 mg by mouth daily.    [name] diltiazem (CARDIZEM CD) 240 MG 24 hr capsule Take 1 capsule (240 mg total) by mouth daily. 30 capsule 11  . exenatide (BYETTA) 10 MCG/0.04ML SOPN injection Inject 10 mcg into the skin 2 (two) times daily with a meal.    . glimepiride (AMARYL) 4 MG tablet Take 4 mg by mouth daily with breakfast.    . metFORMIN (GLUCOPHAGE) 1000 MG tablet Take 1,000 mg by mouth 2 (two) times daily with a meal.    . Omega-3 Fatty Acids (FISH OIL) 1000 MG CAPS Take 1 capsule by mouth daily.    [name] PARoxetine (PAXIL) 20 MG tablet Take 20 mg by mouth daily.    . penicillin v potassium (VEETID) 500 MG tablet     . rosuvastatin (CRESTOR) 20 MG tablet Take 20 mg by  mouth daily.    [name] terazosin (HYTRIN) 5 MG capsule Take 5 mg by mouth at bedtime.    . terbinafine (LAMISIL) 250 MG tablet      No current facility-administered medications for this visit.    Allergies:    Allergies  Allergen Reactions  . Minocin [Minocycline Hcl] Other (See Comments)    Throat swelling    Social History:  The patient  reports that she has never smoked. She does not have any smokeless tobacco history on file. She reports that she does not drink alcohol or use illicit drugs.   Family History:  The patient's family history includes Heart disease in her father; Hypertension in her brother and father; Melanoma in her mother.   ROS:  Please see the history of present illness.      All other systems reviewed and negative.   PHYSICAL EXAM: VS:  BP 142/62 mmHg  Pulse 100  Ht 5\' 3"  (1.6 m)  Wt 221 lb 12.8 oz (100.608 kg)  BMI 39.30 kg/m2  SpO2 99% Well nourished, well developed, in no acute distress HEENT: normal Neck: no JVD Cardiac:  normal S1, S2; RRR; 1/6 SM  at RUSB Lungs:  clear to auscultation bilaterally, no wheezing, rhonchi or rales Abd: soft, nontender, no hepatomegaly Ext: no edema Skin: warm and dry Neuro:  CNs 2-12 intact, no focal abnormalities noted  EKG:  NSR with nonspecific ST abnormality and prolonged QT prolonged at 441msec     ASSESSMENT AND PLAN:  1.  Severe OSA on CPAP - her most recent download showed an AHI of 1.4/hr and 93% compliance in using more than 4 hour nigtly. - continue current pressure settings 2.  HTN - well controlled.  Continue Benazepril/diltiazem/Hytrin 3.  Obesity - she is going to yoga and I have encouraged her to follow a strict diet and watch her portions. 4.  Type II DM - per PCP 5.  Prolonged QTc on EKG.  She uses Lamisil daily and I have asked her to stop it and let her foot MD know  Followup with me in 6 months   Signed, Fransico Him, MD [facility] 09/01/2014 10:14 AM

## 2014-09-01 NOTE — Addendum Note (Signed)
Addended by: Harland German A on: 09/01/2014 10:38 AM   Modules accepted: Orders, Medications

## 2014-12-02 ENCOUNTER — Encounter: Payer: Self-pay | Admitting: Cardiology

## 2014-12-28 ENCOUNTER — Ambulatory Visit (INDEPENDENT_AMBULATORY_CARE_PROVIDER_SITE_OTHER): Payer: Medicare Other | Admitting: Interventional Cardiology

## 2014-12-28 ENCOUNTER — Encounter: Payer: Self-pay | Admitting: Interventional Cardiology

## 2014-12-28 VITALS — BP 156/46 | HR 92 | Ht 63.0 in | Wt 217.0 lb

## 2014-12-28 DIAGNOSIS — I350 Nonrheumatic aortic (valve) stenosis: Secondary | ICD-10-CM

## 2014-12-28 DIAGNOSIS — I480 Paroxysmal atrial fibrillation: Secondary | ICD-10-CM | POA: Diagnosis not present

## 2014-12-28 DIAGNOSIS — G4733 Obstructive sleep apnea (adult) (pediatric): Secondary | ICD-10-CM | POA: Diagnosis not present

## 2014-12-28 DIAGNOSIS — I270 Primary pulmonary hypertension: Secondary | ICD-10-CM | POA: Diagnosis not present

## 2014-12-28 DIAGNOSIS — I272 Pulmonary hypertension, unspecified: Secondary | ICD-10-CM

## 2014-12-28 DIAGNOSIS — I10 Essential (primary) hypertension: Secondary | ICD-10-CM

## 2014-12-28 NOTE — Progress Notes (Signed)
Cardiology Office Note   Date:  12/28/2014   ID:  Doris Jefferson, DOB 07/03/1941, MRN [id number]  PCP:  Stephens Shire, MD  Cardiologist:   Sinclair Grooms, MD   No chief complaint on file.     History of Present Illness: Doris Jefferson is a 74 y.o. female who presents for open of atrial fibrillation and cardioversion. Approximately 6 months ago both amiodarone and anticoagulation was discontinued. She is back today for follow-up to determine if she is maintaining rhythm and to evaluate/follow-up aortic stenosis. She feels well. She denies orthopnea. No episodes of syncope. No medication side effects.    Past Medical History  Diagnosis Date  . Heart murmur   . Diabetes   . Hyperlipidemia   . OSA (obstructive sleep apnea)     severe with AHI 31/hr  . HTN (hypertension)   . PAF (paroxysmal atrial fibrillation)   . Obesity (BMI 30-39.9) 03/04/2014    Past Surgical History  Procedure Laterality Date  . Cardioversion N/A 12/19/2013    Procedure: CARDIOVERSION;  Surgeon: Sinclair Grooms, MD;  [facility] END[facility]COPY;  Service: Cardiovascular;  Laterality: N/A;     Current Outpatient Prescriptions  Medication Sig Dispense Refill  . aspirin EC 81 MG tablet Take 1 tablet (81 mg total) by mouth daily.    . benazepril (LOTENSIN) 20 MG tablet Take 20 mg by mouth daily.    [name] diltiazem (CARDIZEM CD) 240 MG 24 hr capsule Take 1 capsule (240 mg total) by mouth daily. 30 capsule 11  . glimepiride (AMARYL) 4 MG tablet Take 4 mg by mouth daily with breakfast.    . Liraglutide (VICTOZA [place]) Inject 1.8 mLs into the skin daily.    . metFORMIN (GLUCOPHAGE) 1000 MG tablet Take 1,000 mg by mouth 2 (two) times daily with a meal.    . Omega-3 Fatty Acids (FISH OIL) 1000 MG CAPS Take 1 capsule by mouth daily.    [name] PARoxetine (PAXIL) 20 MG tablet Take 20 mg by mouth daily.    . rosuvastatin (CRESTOR) 20 MG tablet Take 20 mg by mouth daily.    [name] terazosin (HYTRIN) 5 MG capsule Take 5 mg by  mouth at bedtime.     No current facility-administered medications for this visit.    Allergies:   Minocin    Social History:  The patient  reports that she has never smoked. She does not have any smokeless tobacco history on file. She reports that she does not drink alcohol or use illicit drugs.   Family History:  The patient's family history includes Heart disease in her father; Hypertension in her brother and father; Melanoma in her mother.    ROS:  Please see the history of present illness.   Otherwise, review of systems are positive for exertional fatigue.   All other systems are reviewed and negative.    PHYSICAL EXAM: VS:  BP 156/46 mmHg  Pulse 92  Ht 5\' 3"  (1.6 m)  Wt 217 lb (98.431 kg)  BMI 38.45 kg/m2 , BMI Body mass index is 38.45 kg/(m^2). GEN: Well nourished, well developed, in no acute distress HEENT: normal Neck: no JVD, carotid bruits, or masses Cardiac: I RR; no murmurs, rubs, or gallops,no edema  Respiratory:  clear to auscultation bilaterally, normal work of breathing GI: soft, nontender, nondistended, + BS MS: no deformity or atrophy Skin: warm and dry, no rash Neuro:  Strength and sensation are intact Psych: euthymic mood, full affect  EKG:  EKG is ordered today. The ekg ordered today demonstrates normal sinus rhythm at 93 bpm. Nonspecific ST-T wave abnormality.   Recent Labs: 12/31/2013: ALT 30; TSH 1.49 01/30/2014: BUN 16; Creatinine 0.8; Potassium 4.4; Sodium 138    Lipid Panel No results found for: CHOL, TRIG, HDL, CHOLHDL, VLDL, LDLCALC, LDLDIRECT    Wt Readings from Last 3 Encounters:  12/28/14 217 lb (98.431 kg)  09/01/14 221 lb 12.8 oz (100.608 kg)  06/23/14 228 lb (103.42 kg)      Other studies Reviewed: Additional studies/ records that were reviewed today include: . Review of the above records demonstrates: No records   ASSESSMENT AND PLAN:  1.  Paroxysmal atrial fibrillation, status post electrical cardioversion to normal  sinus rhythm in early 2015. The patient is asymptomatic with reference to symptoms suggestive of recurrence 2. Mild to moderate aortic stenosis, asymptomatic 3. Essential hypertension with mild elevation in systolic pressure 4. Sleep apnea, now treated with CPAP   Current medicines are reviewed at length with the patient today.  The patient does not have concerns regarding medicines.  The following changes have been made:  no change  Labs/ tests ordered today include:   Orders Placed This Encounter  Procedures  . EKG 12-Lead     Disposition:   FU with [name] in 1 year   Signed, Sinclair Grooms, MD  12/28/2014 5:17 PM    [facility] [place], [place], [place]  [address] Phone: [phone]; Fax: [phone]

## 2014-12-28 NOTE — Patient Instructions (Signed)
Your physician recommends that you continue on your current medications as directed. Please refer to the Current Medication list given to you today.  Your physician wants you to follow-up in: 1 year with Dr.Smith You will receive a reminder letter in the mail two months in advance. If you don't receive a letter, please call our office to schedule the follow-up appointment.  

## 2015-03-04 ENCOUNTER — Encounter: Payer: Self-pay | Admitting: Cardiology

## 2015-03-04 ENCOUNTER — Ambulatory Visit (INDEPENDENT_AMBULATORY_CARE_PROVIDER_SITE_OTHER): Payer: Medicare Other | Admitting: Cardiology

## 2015-03-04 VITALS — BP 160/68 | HR 83 | Ht 63.0 in | Wt 224.0 lb

## 2015-03-04 DIAGNOSIS — E669 Obesity, unspecified: Secondary | ICD-10-CM

## 2015-03-04 DIAGNOSIS — I10 Essential (primary) hypertension: Secondary | ICD-10-CM | POA: Diagnosis not present

## 2015-03-04 DIAGNOSIS — G4733 Obstructive sleep apnea (adult) (pediatric): Secondary | ICD-10-CM | POA: Diagnosis not present

## 2015-03-04 NOTE — Progress Notes (Signed)
Cardiology Office Note   Date:  03/04/2015   ID:  Doris Jefferson, DOB January 25, 1941, MRN [id number]  PCP:  Stephens Shire, MD    Chief Complaint  Patient presents with  . Follow-up    OSA      History of Present Illness: [name] is a 74 y.o. female with a history of PAF, pulmonary HTN, HTN, obesity and OSA on CPAP. She is doing well on her CPAP device. She tolerates her nasal pillow mask and chin strap and feels the pressure is adequate. She says that her mouth gets dry at night. She feels rested in the am and has no daytime sleepiness. She does not snore according to her husband. She goes to yoga for exercise. She denies any nasal congestion, dry mouth or dry eyes.     Past Medical History  Diagnosis Date  . Heart murmur   . Diabetes   . Hyperlipidemia   . OSA (obstructive sleep apnea)     severe with AHI 31/hr  . HTN (hypertension)   . PAF (paroxysmal atrial fibrillation)   . Obesity (BMI 30-39.9) 03/04/2014    Past Surgical History  Procedure Laterality Date  . Cardioversion N/A 12/19/2013    Procedure: CARDIOVERSION;  Surgeon: Sinclair Grooms, MD;  Location: [facility] ENDOSCOPY;  Service: Cardiovascular;  Laterality: N/A;     Current Outpatient Prescriptions  Medication Sig Dispense Refill  . aspirin EC 81 MG tablet Take 1 tablet (81 mg total) by mouth daily.    . benazepril (LOTENSIN) 20 MG tablet Take 20 mg by mouth daily.    [name] diltiazem (CARDIZEM CD) 240 MG 24 hr capsule Take 1 capsule (240 mg total) by mouth daily. 30 capsule 11  . glimepiride (AMARYL) 4 MG tablet Take 4 mg by mouth daily with breakfast.    . Liraglutide (VICTOZA [place]) Inject 1.8 mLs into the skin daily.    . metFORMIN (GLUCOPHAGE) 1000 MG tablet Take 1,000 mg by mouth 2 (two) times daily with a meal.    . Omega-3 Fatty Acids (FISH OIL) 1000 MG CAPS Take 1 capsule by mouth daily.    [name] PARoxetine (PAXIL) 20 MG tablet Take 20 mg by mouth daily.    . rosuvastatin (CRESTOR) 20 MG tablet Take  20 mg by mouth daily.    [name] terazosin (HYTRIN) 5 MG capsule Take 5 mg by mouth at bedtime.     No current facility-administered medications for this visit.    Allergies:   Minocin    Social History:  The patient  reports that she has never smoked. She does not have any smokeless tobacco history on file. She reports that she does not drink alcohol or use illicit drugs.   Family History:  The patient's family history includes Heart disease in her father; Hypertension in her brother and father; Melanoma in her mother.    ROS:  Please see the history of present illness.   Otherwise, review of systems are positive for none.   All other systems are reviewed and negative.    PHYSICAL EXAM: VS:  BP 160/68 mmHg  Pulse 83  Ht 5\' 3"  (1.6 m)  Wt 224 lb (101.606 kg)  BMI 39.69 kg/m2  SpO2 99% , BMI Body mass index is 39.69 kg/(m^2). GEN: Well nourished, well developed, in no acute distress HEENT: normal Neck: no JVD, carotid bruits, or masses Cardiac: RRR; no rubs, or gallops,no edema, 2/6 SM at RUSB to LLSB Respiratory:  clear to  auscultation bilaterally, normal work of breathing GI: soft, nontender, nondistended, + BS MS: no deformity or atrophy Skin: warm and dry, no rash Neuro:  Strength and sensation are intact Psych: euthymic mood, full affect   EKG:  EKG is not ordered today.    Recent Labs: No results found for requested labs within last 365 days.    Lipid Panel No results found for: CHOL, TRIG, HDL, CHOLHDL, VLDL, LDLCALC, LDLDIRECT    Wt Readings from Last 3 Encounters:  03/04/15 224 lb (101.606 kg)  12/28/14 217 lb (98.431 kg)  09/01/14 221 lb 12.8 oz (100.608 kg)     ASSESSMENT AND PLAN:  1. Severe OSA on CPAP - her most recent download showed an AHI of 0.7/hr and 97% compliance in using more than 4 hour nigtly. - continue current pressure settings 2. HTN - borderline controlled. At home it runs 128/40mmHg.  Continue Benazepril/diltiazem/Hytrin 3.  Obesity - she is going to yoga and I have encouraged her to follow a strict diet and watch her portions. 4. Type II DM - per PCP 5. Prolonged QTc on EKG. Resolved after stopping Lamisil    Current medicines are reviewed at length with the patient today.  The patient does not have concerns regarding medicines.  The following changes have been made:  no change  Labs/ tests ordered today include: see above assessment and plan No orders of the defined types were placed in this encounter.     Disposition:   FU with me in 1 year   Signed, Sueanne Margarita, MD  03/04/2015 10:42 AM    [facility] [place], [place],   [address] Phone: [phone]; Fax: [phone]

## 2015-03-04 NOTE — Patient Instructions (Signed)

## 2015-07-17 ENCOUNTER — Ambulatory Visit (INDEPENDENT_AMBULATORY_CARE_PROVIDER_SITE_OTHER): Payer: Medicare Other

## 2015-07-17 ENCOUNTER — Ambulatory Visit (INDEPENDENT_AMBULATORY_CARE_PROVIDER_SITE_OTHER): Payer: Medicare Other | Admitting: Physician Assistant

## 2015-07-17 ENCOUNTER — Emergency Department (HOSPITAL_COMMUNITY): Payer: Medicare Other

## 2015-07-17 ENCOUNTER — Encounter (HOSPITAL_COMMUNITY): Payer: Self-pay | Admitting: Emergency Medicine

## 2015-07-17 ENCOUNTER — Emergency Department (HOSPITAL_COMMUNITY)
Admission: EM | Admit: 2015-07-17 | Discharge: 2015-07-17 | Disposition: A | Discharge status: Home / Self Care | Payer: Medicare Other | Attending: Emergency Medicine | Admitting: Emergency Medicine

## 2015-07-17 VITALS — BP 148/70 | HR 139 | Temp 98.3°F | Resp 18 | Ht 63.0 in | Wt 232.2 lb

## 2015-07-17 DIAGNOSIS — Z7982 Long term (current) use of aspirin: Secondary | ICD-10-CM | POA: Diagnosis not present

## 2015-07-17 DIAGNOSIS — E119 Type 2 diabetes mellitus without complications: Secondary | ICD-10-CM | POA: Insufficient documentation

## 2015-07-17 DIAGNOSIS — Z79899 Other long term (current) drug therapy: Secondary | ICD-10-CM | POA: Diagnosis not present

## 2015-07-17 DIAGNOSIS — E669 Obesity, unspecified: Secondary | ICD-10-CM | POA: Diagnosis not present

## 2015-07-17 DIAGNOSIS — R05 Cough: Secondary | ICD-10-CM

## 2015-07-17 DIAGNOSIS — I4891 Unspecified atrial fibrillation: Secondary | ICD-10-CM | POA: Insufficient documentation

## 2015-07-17 DIAGNOSIS — R0601 Orthopnea: Secondary | ICD-10-CM

## 2015-07-17 DIAGNOSIS — R0789 Other chest pain: Secondary | ICD-10-CM

## 2015-07-17 DIAGNOSIS — R0602 Shortness of breath: Secondary | ICD-10-CM

## 2015-07-17 DIAGNOSIS — R0989 Other specified symptoms and signs involving the circulatory and respiratory systems: Secondary | ICD-10-CM

## 2015-07-17 DIAGNOSIS — I10 Essential (primary) hypertension: Secondary | ICD-10-CM | POA: Diagnosis not present

## 2015-07-17 DIAGNOSIS — E785 Hyperlipidemia, unspecified: Secondary | ICD-10-CM | POA: Diagnosis not present

## 2015-07-17 DIAGNOSIS — R059 Cough, unspecified: Secondary | ICD-10-CM

## 2015-07-17 LAB — I-STAT CHEM 8, ED
BUN: 17 mg/dL (ref 6–20)
CALCIUM ION: 1.12 mmol/L — AB (ref 1.13–1.30)
Chloride: 107 mmol/L (ref 101–111)
Creatinine, Ser: 0.7 mg/dL (ref 0.44–1.00)
Glucose, Bld: 191 mg/dL — ABNORMAL HIGH (ref 65–99)
HCT: 34 % — ABNORMAL LOW (ref 36.0–46.0)
HEMOGLOBIN: 11.6 g/dL — AB (ref 12.0–15.0)
Potassium: 4 mmol/L (ref 3.5–5.1)
SODIUM: 141 mmol/L (ref 135–145)
TCO2: 20 mmol/L (ref 0–100)

## 2015-07-17 LAB — POCT CBC
Granulocyte percent: 62.4 %G (ref 37–80)
HCT, POC: 32.8 % — AB (ref 37.7–47.9)
Hemoglobin: 10 g/dL — AB (ref 12.2–16.2)
LYMPH, POC: 2.4 (ref 0.6–3.4)
MCH, POC: 25.6 pg — AB (ref 27–31.2)
MCHC: 30.7 g/dL — AB (ref 31.8–35.4)
MCV: 83.6 fL (ref 80–97)
MID (CBC): 0.6 (ref 0–0.9)
MPV: 8.5 fL (ref 0–99.8)
PLATELET COUNT, POC: 176 10*3/uL (ref 142–424)
POC Granulocyte: 5 (ref 2–6.9)
POC LYMPH %: 29.7 % (ref 10–50)
POC MID %: 7.9 %M (ref 0–12)
RBC: 3.92 M/uL — AB (ref 4.04–5.48)
RDW, POC: 14.6 %
WBC: 8 10*3/uL (ref 4.6–10.2)

## 2015-07-17 LAB — BASIC METABOLIC PANEL
Anion gap: 13 (ref 5–15)
BUN: 16 mg/dL (ref 6–20)
CHLORIDE: 103 mmol/L (ref 101–111)
CO2: 22 mmol/L (ref 22–32)
Calcium: 9.1 mg/dL (ref 8.9–10.3)
Creatinine, Ser: 0.89 mg/dL (ref 0.44–1.00)
GFR calc Af Amer: >60 mL/min (ref >60)
GFR calc non Af Amer: >60 mL/min (ref >60)
Glucose, Bld: 189 mg/dL — ABNORMAL HIGH (ref 65–99)
Potassium: 4.1 mmol/L (ref 3.5–5.1)
Sodium: 138 mmol/L (ref 135–145)

## 2015-07-17 LAB — CBC
HEMATOCRIT: 34.6 % — AB (ref 36.0–46.0)
Hemoglobin: 10.5 g/dL — ABNORMAL LOW (ref 12.0–15.0)
MCH: 26.7 pg (ref 26.0–34.0)
MCHC: 30.3 g/dL (ref 30.0–36.0)
MCV: 88 fL (ref 78.0–100.0)
Platelets: 169 10*3/uL (ref 150–400)
RBC: 3.93 MIL/uL (ref 3.87–5.11)
RDW: 13.9 % (ref 11.5–15.5)
WBC: 8.5 10*3/uL (ref 4.0–10.5)

## 2015-07-17 LAB — BRAIN NATRIURETIC PEPTIDE: B Natriuretic Peptide: 263.5 pg/mL — ABNORMAL HIGH (ref 0.0–100.0)

## 2015-07-17 MED ORDER — APIXABAN 5 MG PO TABS
5.0000 mg | ORAL_TABLET | Freq: Once | ORAL | Status: AC
  Administered 2015-07-17: 5 mg via ORAL
  Filled 2015-07-17: qty 1

## 2015-07-17 MED ORDER — DILTIAZEM HCL ER COATED BEADS 180 MG PO CP24: 180.0000 mg | ORAL_CAPSULE | Freq: Two times a day (BID) | ORAL | Status: DC

## 2015-07-17 MED ORDER — DILTIAZEM HCL 25 MG/5ML IV SOLN
10.0000 mg | Freq: Once | INTRAVENOUS | Status: AC
  Administered 2015-07-17: 10 mg via INTRAVENOUS
  Filled 2015-07-17: qty 5

## 2015-07-17 MED ORDER — APIXABAN 5 MG PO TABS
5.0000 mg | ORAL_TABLET | Freq: Two times a day (BID) | ORAL | Status: DC
Start: 2015-07-17 — End: 2015-07-20

## 2015-07-17 MED ORDER — DILTIAZEM HCL ER COATED BEADS 240 MG PO CP24: 240.0000 mg | ORAL_CAPSULE | Freq: Every day | ORAL | Status: DC

## 2015-07-17 NOTE — Progress Notes (Signed)
Subjective:    Patient ID: Doris Jefferson, female    DOB: 03/11/1941, 74 y.o.   MRN: [id number]  Chief Complaint  Patient presents with  . Cough    Slightly productive, has history of bronchitis and pneumonia  . Shortness of Breath  . Wheezing   Medications, allergies, past medical history, surgical history, family history, social history and problem list reviewed and updated.  HPI  74 yof presents with above sx.   Sx started 2 wks ago with feeling like "crud." Coughing and feeling sob. Got better after a week then about 4 days ago started feeling bad again. Feeling sob both at baseline and having some increased DOE with minimal exertion. Coughing daily, not very productive. Orthopnea past week. Sleeping with 2-3 pillows which is unusual for her. No chest pain but feels tightness across chest with the coughing spells. Subjective fevers. Diaphoretic past few days. Low energy past few days.   Denies hx copd, asthma, smoking. She sees cardiology for intermittent afib.  Per their May 2016 note she has osa with 97% cpap compliance, pulmonary htn. Per pt no history cad or chf.   Review of Systems See HPI     Objective:   Physical Exam  Constitutional: She is oriented to person, place, and time. She appears well-developed and well-nourished.  Non-toxic appearance. She does not have a sickly appearance. She does not appear ill. No distress.  BP 148/70 mmHg  Pulse 139  Temp(Src) 98.3 F (36.8 C) (Oral)  Resp 18  Ht 5\' 3"  (1.6 m)  Wt 232 lb 3.2 oz (105.325 kg)  BMI 41.14 kg/m2  SpO2 96%   Cardiovascular: An irregular rhythm present. Tachycardia present.   Murmur heard. HR 130-140s, slightly irregular. 2/6 murmur. Likely systolic, difficult to determine with elevated HR. 1+ pitting edema bilaterally to below knee.   Pulmonary/Chest: No accessory muscle usage. Tachypnea noted. No respiratory distress. She has decreased breath sounds. She has no wheezes. She has rhonchi in the right  lower field and the left lower field. She has rales in the left lower field.  Neurological: She is alert and oriented to person, place, and time.   [facility] reading (PRIMARY) by  Dr. Tamala Julian. Chest xray findings: Development of cardiomegaly since last xray. Increased mild pulmonary vasculature.    EKG read by Dr. Tamala Julian.  Findings: Afib with RVR. TWI inferolateral leads. ST depression anterolateral leads.   Results for orders placed or performed in visit on 07/17/15  POCT CBC  Result Value Ref Range   WBC 8.0 4.6 - 10.2 K/uL   Lymph, poc 2.4 0.6 - 3.4   POC LYMPH PERCENT 29.7 10 - 50 %L   MID (cbc) 0.6 0 - 0.9   POC MID % 7.9 0 - 12 %M   POC Granulocyte 5.0 2 - 6.9   Granulocyte percent 62.4 37 - 80 %G   RBC 3.92 (A) 4.04 - 5.48 M/uL   Hemoglobin 10.0 (A) 12.2 - 16.2 g/dL   HCT, POC 32.8 (A) 37.7 - 47.9 %   MCV 83.6 80 - 97 fL   MCH, POC 25.6 (A) 27 - 31.2 pg   MCHC 30.7 (A) 31.8 - 35.4 g/dL   RDW, POC 14.6 %   Platelet Count, POC 176 142 - 424 K/uL   MPV 8.5 0 - 99.8 fL      Assessment & Plan:   SOB (shortness of breath) - Plan: DG Chest 2 View, POCT CBC, EKG 12-Lead Abnormal lung sounds  Chest tightness Orthopnea Cough --concerning picture with known afib (but now with rvr and no anticoagulation other than asa), new st depressions on ekg, chest tightness, sob, and orthopnea the past couple weeks, and increased heart size on cxr compared to prior --to [place] ed by private vehicle, husband driving --called and spoke with er charge rn, they are aware  Julieta Gutting, PA-C Physician Assistant-Certified [facility]  07/17/2015 8:57 AM

## 2015-07-17 NOTE — Discharge Instructions (Signed)
Atrial Fibrillation Discontinue aspirin. Decrease your Lotensin dose to 10 mg daily. You may cut your 20 mg tablets in half. Discontinue Cardizem CD 240 mg daily. The dose of Cardizem CD has been changed to 180 mg twice daily. Use your new prescription, and start to take it tomorrow. Start to take Eliquis tonight at bedtime. The atrial fibrillation clinic from your cardiology office should call you in 2 days to arrange to be seen next week. If you don't hear from them by Monday, October 10, call to schedule an appointment. Tell the office staff that [place] spoke with Dr. Caryl Comes about your case. Atrial fibrillation is a type of heartbeat that is irregular or fast (rapid). If you have this condition, your heart keeps quivering in a weird (chaotic) way. This condition can make it so your heart cannot pump blood normally. Having this condition gives a person more risk for stroke, heart failure, and other heart problems. There are different types of atrial fibrillation. Talk with your doctor to learn about the type that you have. HOME CARE  Take over-the-counter and prescription medicines only as told by your doctor.  If your doctor prescribed a blood-thinning medicine, take it exactly as told. Taking too much of it can cause bleeding. If you do not take enough of it, you will not have the protection that you need against stroke and other problems.  Do not use any tobacco products. These include cigarettes, chewing tobacco, and e-cigarettes. If you need help quitting, ask your doctor.  If you have apnea (obstructive sleep apnea), manage it as told by your doctor.  Do not drink alcohol.  Do not drink beverages that have caffeine. These include coffee, soda, and tea.  Maintain a healthy weight. Do not use diet pills unless your doctor says they are safe for you. Diet pills may make heart problems worse.  Follow diet instructions as told by your doctor.  Exercise regularly as told by your  doctor.  Keep all follow-up visits as told by your doctor. This is important. GET HELP IF:  You notice a change in the speed, rhythm, or strength of your heartbeat.  You are taking a blood-thinning medicine and you notice more bruising.  You get tired more easily when you move or exercise. GET HELP RIGHT AWAY IF:  You have pain in your chest or your belly (abdomen).  You have sweating or weakness.  You feel sick to your stomach (nauseous).  You notice blood in your throw up (vomit), poop (stool), or pee (urine).  You are short of breath.  You suddenly have swollen feet and ankles.  You feel dizzy.  Your suddenly get weak or numb in your face, arms, or legs, especially if it happens on one side of your body.  You have trouble talking, trouble understanding, or both.  Your face or your eyelid droops on one side. These symptoms may be an emergency. Do not wait to see if the symptoms will go away. Get medical help right away. Call your local emergency services (911 in the [place] Do not drive yourself to the hospital.   This information is not intended to replace advice given to you by your health care provider. Make sure you discuss any questions you have with your health care provider.   Document Released: 07/04/2008 Document Revised: 06/16/2015 Document Reviewed: 01/20/2015 Elsevier Interactive Patient Education 2016 [place] on my medicine - ELIQUIS (apixaban)  This medication education was reviewed with me or my  healthcare representative as part of my discharge preparation.  The pharmacist that spoke with me during my hospital stay was:  Reginia Naas, [facility]  Why was Eliquis prescribed for you? Eliquis was prescribed for you to reduce the risk of forming blood clots that can cause a stroke if you have a medical condition called atrial fibrillation (a type of irregular heartbeat) OR to reduce the risk of a blood clots forming after orthopedic  surgery.  What do You need to know about Eliquis ? Take your Eliquis TWICE DAILY - one tablet in the morning and one tablet in the evening with or without food.  It would be best to take the doses about the same time each day.  If you have difficulty swallowing the tablet whole please discuss with your pharmacist how to take the medication safely.  Take Eliquis exactly as prescribed by your doctor and DO NOT stop taking Eliquis without talking to the doctor who prescribed the medication.  Stopping may increase your risk of developing a new clot or stroke.  Refill your prescription before you run out.  After discharge, you should have regular check-up appointments with your healthcare provider that is prescribing your Eliquis.  In the future your dose may need to be changed if your kidney function or weight changes by a significant amount or as you get older.  What do you do if you miss a dose? If you miss a dose, take it as soon as you remember on the same day and resume taking twice daily.  Do not take more than one dose of ELIQUIS at the same time.  Important Safety Information A possible side effect of Eliquis is bleeding. You should call your healthcare provider right away if you experience any of the following: ? Bleeding from an injury or your nose that does not stop. ? Unusual colored urine (red or dark brown) or unusual colored stools (red or black). ? Unusual bruising for unknown reasons. ? A serious fall or if you hit your head (even if there is no bleeding).  Some medicines may interact with Eliquis and might increase your risk of bleeding or clotting while on Eliquis. To help avoid this, consult your healthcare provider or pharmacist prior to using any new prescription or non-prescription medications, including herbals, vitamins, non-steroidal anti-inflammatory drugs (NSAIDs) and supplements.  This website has more information on Eliquis (apixaban):  [email or web address].no.

## 2015-07-17 NOTE — ED Notes (Signed)
Pt came via POV from cardiologist office for Afib. Pt c/o shortness of breath on exertion, beginning yesterday. This morning she had increased shortness of breath at rest.

## 2015-07-17 NOTE — ED Notes (Signed)
Brought patient to room via wheelchair; patient undressed, in gown, on monitor, continuous pulse oximetry and blood pressure cuff

## 2015-07-17 NOTE — ED Provider Notes (Signed)
CSN: [id number]     Arrival date & time 07/17/15  6195 History   First MD Initiated Contact with Patient 07/17/15 [phone]     Chief Complaint  Patient presents with  . Atrial Fibrillation    sent by cardiologist     (Consider location/radiation/quality/duration/timing/severity/associated sxs/prior Treatment) HPI Complains of feeling congested with cough and shortness of breath onset 4 days ago other symptoms include generalized weakness.. Denies fever. No other associated symptoms. Patient seen at urgent care center this morning noted to be in atrial fib with rapid ventricular response. Sent here for further evaluation. No treatment prior to coming here. Nothing makes symptoms better or worse. She denies any chest pain. Denies fever. Past Medical History  Diagnosis Date  . Heart murmur   . Diabetes ([place])   . Hyperlipidemia   . OSA (obstructive sleep apnea)     severe with AHI 31/hr  . HTN (hypertension)   . PAF (paroxysmal atrial fibrillation) ([place])   . Obesity (BMI 30-39.9) 03/04/2014  . Anemia    Past Surgical History  Procedure Laterality Date  . Cardioversion N/A 12/19/2013    Procedure: CARDIOVERSION;  Surgeon: Sinclair Grooms, MD;  Location: [place];  Service: Cardiovascular;  Laterality: N/A;  . Eye surgery    . Abdominal hysterectomy    . Spine surgery     Family History  Problem Relation Age of Onset  . Hypertension Father   . Heart disease Father   . Melanoma Mother   . Hypertension Brother    Social History  Substance Use Topics  . Smoking status: Never Smoker   . Smokeless tobacco: None  . Alcohol Use: No   OB History    No data available     Review of Systems  HENT: Positive for congestion.   Respiratory: Positive for shortness of breath.   Cardiovascular: Negative.   Gastrointestinal: Negative.   Musculoskeletal: Negative.   Skin: Negative.   Neurological: Positive for weakness.  Psychiatric/Behavioral: Negative.   All other systems reviewed  and are negative.     Allergies  Minocin and Tape  Home Medications   Prior to Admission medications   Medication Sig Start Date End Date Taking? Authorizing Provider  aspirin EC 81 MG tablet Take 1 tablet (81 mg total) by mouth daily. 06/23/14   Belva Crome, MD  benazepril (LOTENSIN) 20 MG tablet Take 20 mg by mouth daily.    Historical Provider, MD  diltiazem (CARDIZEM CD) 240 MG 24 hr capsule Take 1 capsule (240 mg total) by mouth daily. 12/09/13   Liliane Shi, PA-C  glimepiride (AMARYL) 4 MG tablet Take 4 mg by mouth daily with breakfast.    Historical Provider, MD  Liraglutide (VICTOZA [place]) Inject 1.8 mLs into the skin daily.    Historical Provider, MD  metFORMIN (GLUCOPHAGE) 1000 MG tablet Take 1,000 mg by mouth 2 (two) times daily with a meal.    Historical Provider, MD  Omega-3 Fatty Acids (FISH OIL) 1000 MG CAPS Take 1 capsule by mouth daily.    Historical Provider, MD  PARoxetine (PAXIL) 20 MG tablet Take 20 mg by mouth daily.    Historical Provider, MD  rosuvastatin (CRESTOR) 20 MG tablet Take 20 mg by mouth daily.    Historical Provider, MD  terazosin (HYTRIN) 5 MG capsule Take 5 mg by mouth at bedtime.    Historical Provider, MD   There were no vitals taken for this visit. Physical Exam  Constitutional: She appears well-developed  and well-nourished.  HENT:  Head: Normocephalic and atraumatic.  Eyes: Conjunctivae are normal. Pupils are equal, round, and reactive to light.  Neck: Neck supple. No tracheal deviation present. No thyromegaly present.  Cardiovascular:  No murmur heard. Tachycardic, irregularly irregular  Pulmonary/Chest: Effort normal and breath sounds normal.  Abdominal: Soft. Bowel sounds are normal. She exhibits no distension. There is no tenderness.  Obese  Musculoskeletal: Normal range of motion. She exhibits no edema or tenderness.  Neurological: She is alert. Coordination normal.  Skin: Skin is warm and dry. No rash noted.  Psychiatric: She has a  normal mood and affect.  Nursing note and vitals reviewed.   ED Course  Procedures (including critical care time) Labs Review Labs Reviewed  BASIC METABOLIC PANEL  CBC    Imaging Review No results found. I have personally reviewed and evaluated these images and lab results as part of my medical decision-making.   EKG Interpretation   Date/Time:  Saturday July 17 2015 09:52:10 EDT Ventricular Rate:  125 PR Interval:    QRS Duration: 113 QT Interval:  335 QTC Calculation: 483 R Axis:   -51 Text Interpretation:  Atrial fibrillation LAD, consider left anterior  fascicular block Low voltage, precordial leads Repol abnrm suggests  ischemia, anterolateral SINCE LAST TRACING HEART RATE HAS INCREASED  Confirmed by Winfred Leeds  MD, Cooper Moroney ([id number]) on 07/17/2015 10:02:56 AM     11:40 AM patient is asymptomatic after treatment with intravenous Cardizem. Chest xray viewed by me. Results for orders placed or performed during the hospital encounter of 62/13/08  Basic metabolic panel  Result Value Ref Range   Sodium 138 135 - 145 mmol/L   Potassium 4.1 3.5 - 5.1 mmol/L   Chloride 103 101 - 111 mmol/L   CO2 22 22 - 32 mmol/L   Glucose, Bld 189 (H) 65 - 99 mg/dL   BUN 16 6 - 20 mg/dL   Creatinine, Ser 0.89 0.44 - 1.00 mg/dL   Calcium 9.1 8.9 - 10.3 mg/dL   GFR calc non Af Amer >60 >60 mL/min   GFR calc Af Amer >60 >60 mL/min   Anion gap 13 5 - 15  CBC  Result Value Ref Range   WBC 8.5 4.0 - 10.5 K/uL   RBC 3.93 3.87 - 5.11 MIL/uL   Hemoglobin 10.5 (L) 12.0 - 15.0 g/dL   HCT 34.6 (L) 36.0 - 46.0 %   MCV 88.0 78.0 - 100.0 fL   MCH 26.7 26.0 - 34.0 pg   MCHC 30.3 30.0 - 36.0 g/dL   RDW 13.9 11.5 - 15.5 %   Platelets 169 150 - 400 K/uL  Brain natriuretic peptide  Result Value Ref Range   B Natriuretic Peptide 263.5 (H) 0.0 - 100.0 pg/mL  I-stat chem 8, ed  Result Value Ref Range   Sodium 141 135 - 145 mmol/L   Potassium 4.0 3.5 - 5.1 mmol/L   Chloride 107 101 - 111 mmol/L    BUN 17 6 - 20 mg/dL   Creatinine, Ser 0.70 0.44 - 1.00 mg/dL   Glucose, Bld 191 (H) 65 - 99 mg/dL   Calcium, Ion 1.12 (L) 1.13 - 1.30 mmol/L   TCO2 20 0 - 100 mmol/L   Hemoglobin 11.6 (L) 12.0 - 15.0 g/dL   HCT 34.0 (L) 36.0 - 46.0 %   Dg Chest 2 View  07/17/2015   CLINICAL DATA:  Chest tightness and congestion for 1-1/2 weeks.  EXAM: CHEST  2 VIEW  COMPARISON:  12/12/2007  FINDINGS: Cardiomediastinal silhouette is enlarged. Enlargement of the diameter of the ascending aorta is also noted.  There is no evidence of focal airspace consolidation, pleural effusion or pneumothorax. There is mild pulmonary vascular congestion.  Osseous structures are without acute abnormality. Soft tissues are grossly normal.  IMPRESSION: Enlargement of the cardiac silhouette and mild pulmonary vascular congestion.  Enlargement of the diameter of the ascending aorta is also noted. If vascular pathology is suspected, CT of the chest may be considered.   Electronically Signed   By: Fidela Salisbury M.D.   On: 07/17/2015 11:16   Dg Chest Portable 1 View  07/17/2015   CLINICAL DATA:  Shortness of breath over the last 3 days. No chest pain.  EXAM: PORTABLE CHEST 1 VIEW  COMPARISON:  10/24/2010  FINDINGS: The cardiac silhouette is enlarged. The aorta is unfolded. There is venous hypertension without frank edema. No effusions. No bony abnormalities.  IMPRESSION: Cardiac enlargement with venous hypertension.   Electronically Signed   By: Nelson Chimes M.D.   On: 07/17/2015 10:36    MDM  I spoke with Dr. Caryl Comes. Anemia is chronic and unchanged. Plan we will increase Cardizem CD to 180 mg twice a day, discontinue aspirin, prescription eliquis 5 mg twice a day decrease Lotensin dosage to 10 mg daily. Patient to follow-up at atrial fibrillation clinic next week Final diagnoses:  None  Dx #1 atrial fibrillation with rapid ventricular response #2 hyperglycemia #3 anemia      Orlie Dakin, MD 07/17/15 1153

## 2015-07-17 NOTE — ED Notes (Signed)
MD at bedside. 

## 2015-07-20 ENCOUNTER — Ambulatory Visit (HOSPITAL_COMMUNITY)
Admission: RE | Admit: 2015-07-20 | Discharge: 2015-07-20 | Disposition: A | Discharge status: Home / Self Care | Payer: Medicare Other | Source: Ambulatory Visit | Attending: Nurse Practitioner | Admitting: Nurse Practitioner

## 2015-07-20 VITALS — BP 152/78 | HR 113 | Ht 63.0 in | Wt 230.4 lb

## 2015-07-20 DIAGNOSIS — E669 Obesity, unspecified: Secondary | ICD-10-CM | POA: Diagnosis not present

## 2015-07-20 DIAGNOSIS — I10 Essential (primary) hypertension: Secondary | ICD-10-CM | POA: Diagnosis not present

## 2015-07-20 DIAGNOSIS — G4733 Obstructive sleep apnea (adult) (pediatric): Secondary | ICD-10-CM | POA: Diagnosis not present

## 2015-07-20 DIAGNOSIS — I480 Paroxysmal atrial fibrillation: Secondary | ICD-10-CM | POA: Insufficient documentation

## 2015-07-20 MED ORDER — METOPROLOL SUCCINATE ER 25 MG PO TB24: 25.0000 mg | ORAL_TABLET | Freq: Every day | ORAL | Status: DC

## 2015-07-20 MED ORDER — APIXABAN 5 MG PO TABS: 5.0000 mg | ORAL_TABLET | Freq: Two times a day (BID) | ORAL | Status: DC

## 2015-07-20 NOTE — Patient Instructions (Signed)
Your physician has recommended you make the following change in your medication:  1)Metoprolol 25mg  once a day  Parking code for November 9000

## 2015-07-21 ENCOUNTER — Encounter (HOSPITAL_COMMUNITY): Payer: Self-pay | Admitting: Nurse Practitioner

## 2015-07-21 NOTE — Progress Notes (Signed)
Patient ID: Doris Jefferson, female   DOB: 1941/01/26, 74 y.o.   MRN: [id number]     Primary Care Physician: Stephens Shire, MD Referring Physician: [facility] f/u   Doris Jefferson is a 74 y.o. female with a h/o PAF that had onset of afib 18 months ago and had a cardioversion. This past Saturday, he was clipping some shrubbery and started feeling hot and sweaty. She proceeded to the er and was found to be in afib with rvr at 125 bpm. Labs checked and unremarkable. Cardizem was increased to 180 mg qd and she was started on eliquis 5 mg bid, Lotensin decreased to 10 mg a day.  She returns today with afib at 113 bpm. Feels better. Tolerating DOAC with a chadsvasc score of at least 4. Has OSA and sleeps with cpap. No alcohol, tobacco or high caffeine use. No regular exercise.  Today, she denies symptoms of palpitations, chest pain, shortness of breath, orthopnea, PND, lower extremity edema, dizziness, presyncope, syncope, or neurologic sequela. The patient is tolerating medications without difficulties and is otherwise without complaint today.   Past Medical History  Diagnosis Date  . Heart murmur   . Diabetes ([place])   . Hyperlipidemia   . OSA (obstructive sleep apnea)     severe with AHI 31/hr  . HTN (hypertension)   . PAF (paroxysmal atrial fibrillation) ([place])   . Obesity (BMI 30-39.9) 03/04/2014  . Anemia    Past Surgical History  Procedure Laterality Date  . Cardioversion N/A 12/19/2013    Procedure: CARDIOVERSION;  Surgeon: Sinclair Grooms, MD;  Location: [place];  Service: Cardiovascular;  Laterality: N/A;  . Eye surgery    . Abdominal hysterectomy    . Spine surgery      Current Outpatient Prescriptions  Medication Sig Dispense Refill  . apixaban (ELIQUIS) 5 MG TABS tablet Take 1 tablet (5 mg total) by mouth 2 (two) times daily. 60 tablet 0  . benazepril (LOTENSIN) 20 MG tablet Take 20 mg by mouth daily. Pt is taking 10 mg (half a tablet) daily    . diltiazem (CARDIZEM CD) 180 MG  24 hr capsule Take 1 capsule (180 mg total) by mouth 2 (two) times daily. 60 capsule 0  . glimepiride (AMARYL) 4 MG tablet Take 4 mg by mouth daily with breakfast.    . Liraglutide (VICTOZA [place]) Inject 1.8 mg into the skin daily.     . metFORMIN (GLUCOPHAGE) 1000 MG tablet Take 1,000 mg by mouth 2 (two) times daily with a meal.    . Omega-3 Fatty Acids (FISH OIL) 1000 MG CAPS Take 1 capsule by mouth daily.    [name] PARoxetine (PAXIL) 20 MG tablet Take 20 mg by mouth daily.    . rosuvastatin (CRESTOR) 20 MG tablet Take 20 mg by mouth daily.    [name] terazosin (HYTRIN) 5 MG capsule Take 5 mg by mouth at bedtime.    . metoprolol succinate (TOPROL-XL) 25 MG 24 hr tablet Take 1 tablet (25 mg total) by mouth daily. 30 tablet 3   No current facility-administered medications for this encounter.    Allergies  Allergen Reactions  . Minocin [Minocycline Hcl] Other (See Comments)    Throat swelling  . Tape Rash    Social History   Social History  . Marital Status: Married    Spouse Name: N/A  . Number of Children: N/A  . Years of Education: N/A   Occupational History  . Not on file.   Social  History Main Topics  . Smoking status: Never Smoker   . Smokeless tobacco: Not on file  . Alcohol Use: No  . Drug Use: No  . Sexual Activity: Not on file   Other Topics Concern  . Not on file   Social History Narrative    Family History  Problem Relation Age of Onset  . Hypertension Father   . Heart disease Father   . Melanoma Mother   . Hypertension Brother     ROS- All systems are reviewed and negative except as per the HPI above  Physical Exam: Filed Vitals:   07/20/15 0900  BP: 152/78  Pulse: 113  Height: 5\' 3"  (1.6 m)  Weight: 230 lb 6.4 oz (104.509 kg)    GEN- The patient is well appearing, alert and oriented x 3 today.   Head- normocephalic, atraumatic Eyes-  Sclera clear, conjunctiva pink Ears- hearing intact Oropharynx- clear Neck- supple, no JVP Lymph- no cervical  lymphadenopathy Lungs- Clear to ausculation bilaterally, normal work of breathing Heart- Irregular rate and rhythm, no murmurs, rubs or gallops, PMI not laterally displaced GI- soft, NT, ND, + BS Extremities- no clubbing, cyanosis, or edema MS- no significant deformity or atrophy Skin- no rash or lesion Psych- euthymic mood, full affect Neuro- strength and sensation are intact  EKG- afib with rvr with v rate at 113 bpm, LAD, st/t wave abnormalityu, consider lateral ischemia, qrs int 102 ms, qtc 474 ms. Epic records reviewed   Assessment and Plan: 1. Afib For now will rate control and will add metoprolol ER 25 mg qd After full anticoagulation of 3-4 weeks, can  discuss DCCV or chemical conversion to SR. Continue cardizem Continue eliquis 5 mg bid  2. HTN Elevated today Metoprolol added today, may help If still elevated on return visit will increase dose of losartan back to original  Dose  3. OSA Using cpap  4. Obesity Weight loss/exercise recommended   F/u in afib clinic 11/3

## 2015-08-11 ENCOUNTER — Ambulatory Visit (HOSPITAL_COMMUNITY)
Admission: RE | Admit: 2015-08-11 | Discharge: 2015-08-11 | Disposition: A | Discharge status: Home / Self Care | Payer: Medicare Other | Source: Ambulatory Visit | Attending: Nurse Practitioner | Admitting: Nurse Practitioner

## 2015-08-11 ENCOUNTER — Encounter (HOSPITAL_COMMUNITY): Payer: Self-pay | Admitting: Nurse Practitioner

## 2015-08-11 VITALS — BP 146/68 | HR 99 | Ht 63.0 in | Wt 235.4 lb

## 2015-08-11 DIAGNOSIS — E669 Obesity, unspecified: Secondary | ICD-10-CM | POA: Diagnosis not present

## 2015-08-11 DIAGNOSIS — I4819 Other persistent atrial fibrillation: Secondary | ICD-10-CM

## 2015-08-11 DIAGNOSIS — I481 Persistent atrial fibrillation: Secondary | ICD-10-CM

## 2015-08-11 DIAGNOSIS — I10 Essential (primary) hypertension: Secondary | ICD-10-CM | POA: Insufficient documentation

## 2015-08-11 DIAGNOSIS — G4733 Obstructive sleep apnea (adult) (pediatric): Secondary | ICD-10-CM | POA: Diagnosis not present

## 2015-08-11 LAB — BASIC METABOLIC PANEL
ANION GAP: 8 (ref 5–15)
BUN: 14 mg/dL (ref 6–20)
CALCIUM: 9.5 mg/dL (ref 8.9–10.3)
CO2: 26 mmol/L (ref 22–32)
Chloride: 108 mmol/L (ref 101–111)
Creatinine, Ser: 0.77 mg/dL (ref 0.44–1.00)
GFR calc Af Amer: >60 mL/min (ref >60)
GLUCOSE: 140 mg/dL — AB (ref 65–99)
POTASSIUM: 4.5 mmol/L (ref 3.5–5.1)
SODIUM: 142 mmol/L (ref 135–145)

## 2015-08-11 LAB — CBC
HCT: 33.9 % — ABNORMAL LOW (ref 36.0–46.0)
Hemoglobin: 10.4 g/dL — ABNORMAL LOW (ref 12.0–15.0)
MCH: 26.4 pg (ref 26.0–34.0)
MCHC: 30.7 g/dL (ref 30.0–36.0)
MCV: 86 fL (ref 78.0–100.0)
PLATELETS: 185 10*3/uL (ref 150–400)
RBC: 3.94 MIL/uL (ref 3.87–5.11)
RDW: 14 % (ref 11.5–15.5)
WBC: 9.1 10*3/uL (ref 4.0–10.5)

## 2015-08-11 MED ORDER — METOPROLOL SUCCINATE ER 25 MG PO TB24: 25.0000 mg | ORAL_TABLET | Freq: Two times a day (BID) | ORAL | Status: DC

## 2015-08-11 MED ORDER — DILTIAZEM HCL ER COATED BEADS 180 MG PO CP24
180.0000 mg | ORAL_CAPSULE | Freq: Two times a day (BID) | ORAL | Status: DC
Start: 2015-08-11 — End: 2016-03-17

## 2015-08-11 MED ORDER — FUROSEMIDE 20 MG PO TABS: 20.0000 mg | ORAL_TABLET | Freq: Every day | ORAL | Status: DC

## 2015-08-11 NOTE — Patient Instructions (Signed)
Cardioversion scheduled for Monday, November 7th  - Arrive at the [facility] and go to admitting at [facility] not eat or drink anything after midnight the night prior to your procedure.  - Take all your medication with a sip of water prior to arrival - EXCEPT your diabetes medication  - You will not be able to drive home after your procedure.  Your physician has recommended you make the following change in your medication:  1)Lasix 20mg  once a day 2)Increase metoprolol to 25mg  twice a day

## 2015-08-11 NOTE — Progress Notes (Addendum)
Patient ID: DONEEN OLLINGER, female   DOB: 10-16-1940, 74 y.o.   MRN: [id number]     Primary Care Physician: Stephens Shire, MD Referring Physician: [facility] f/u   LORISSA KISHBAUGH is a 73 y.o. female with a h/o PAF that had onset of afib 18 months ago and had short term treatment with amiodarone and then had cardioversion. Blood thinner was stopped several months after maintaining SR.   This past Saturday, she was clipping some shrubbery and started feeling hot and sweaty. She proceeded to the er and was found to be in afib with rvr at 125 bpm. She thinks she may have been in afib for about a week . Labs checked and unremarkable. Cardizem was increased to 180 mg bid , Lotensin decreased to 10 mg a day.  She returned 10/11 with afib at 113 bpm. Felt better. Initiated Eliquis 5 mg bid with a chadsvasc score of at least 4. Has OSA and sleeps with cpap. No alcohol, tobacco or high caffeine use. No regular exercise.  She is here today with increased dyspnea with exertion, 5 lb weight gain and pedal edema. Resting heart rate is high 90's. She has now been anticoagulated x 3 weeks and will plan on cardioversion.   Today, she denies symptoms of palpitations, chest pain, shortness of breath, orthopnea, PND, lower extremity edema, dizziness, presyncope, syncope, or neurologic sequela. The patient is tolerating medications without difficulties and is otherwise without complaint today.   Past Medical History  Diagnosis Date  . Heart murmur   . Diabetes ([place])   . Hyperlipidemia   . OSA (obstructive sleep apnea)     severe with AHI 31/hr  . HTN (hypertension)   . PAF (paroxysmal atrial fibrillation) ([place])   . Obesity (BMI 30-39.9) 03/04/2014  . Anemia    Past Surgical History  Procedure Laterality Date  . Cardioversion N/A 12/19/2013    Procedure: CARDIOVERSION;  Surgeon: Sinclair Grooms, MD;  Location: [place];  Service: Cardiovascular;  Laterality: N/A;  . Eye surgery    . Abdominal hysterectomy      . Spine surgery      Current Outpatient Prescriptions  Medication Sig Dispense Refill  . apixaban (ELIQUIS) 5 MG TABS tablet Take 1 tablet (5 mg total) by mouth 2 (two) times daily. 60 tablet 0  . benazepril (LOTENSIN) 20 MG tablet Take 20 mg by mouth daily. Pt is taking 10 mg (half a tablet) daily    . diltiazem (CARDIZEM CD) 180 MG 24 hr capsule Take 1 capsule (180 mg total) by mouth 2 (two) times daily. 60 capsule 6  . glimepiride (AMARYL) 4 MG tablet Take 4 mg by mouth daily with breakfast.    . Liraglutide (VICTOZA [place]) Inject 1.8 mg into the skin daily.     . metFORMIN (GLUCOPHAGE) 1000 MG tablet Take 1,000 mg by mouth 2 (two) times daily with a meal.    . metoprolol succinate (TOPROL-XL) 25 MG 24 hr tablet Take 1 tablet (25 mg total) by mouth 2 (two) times daily. 60 tablet 3  . Omega-3 Fatty Acids (FISH OIL) 1000 MG CAPS Take 1 capsule by mouth daily.    [name] PARoxetine (PAXIL) 20 MG tablet Take 20 mg by mouth daily.    . rosuvastatin (CRESTOR) 20 MG tablet Take 20 mg by mouth daily.    [name] terazosin (HYTRIN) 5 MG capsule Take 5 mg by mouth at bedtime.    . furosemide (LASIX) 20 MG tablet Take 1 tablet (  20 mg total) by mouth daily. 30 tablet 6   No current facility-administered medications for this encounter.    Allergies  Allergen Reactions  . Minocin [Minocycline Hcl] Other (See Comments)    Throat swelling  . Tape Rash    Social History   Social History  . Marital Status: Married    Spouse Name: N/A  . Number of Children: N/A  . Years of Education: N/A   Occupational History  . Not on file.   Social History Main Topics  . Smoking status: Never Smoker   . Smokeless tobacco: Not on file  . Alcohol Use: No  . Drug Use: No  . Sexual Activity: Not on file   Other Topics Concern  . Not on file   Social History Narrative    Family History  Problem Relation Age of Onset  . Hypertension Father   . Heart disease Father   . Melanoma Mother   . Hypertension Brother      ROS- All systems are reviewed and negative except as per the HPI above  Physical Exam: Filed Vitals:   08/11/15 1430  BP: 146/68  Pulse: 99  Height: 5\' 3"  (1.6 m)  Weight: 235 lb 6.4 oz (106.777 kg)    GEN- The patient is well appearing, alert and oriented x 3 today.   Head- normocephalic, atraumatic Eyes-  Sclera clear, conjunctiva pink Ears- hearing intact Oropharynx- clear Neck- supple, no JVP Lymph- no cervical lymphadenopathy Lungs- Clear to ausculation bilaterally, normal work of breathing Heart- Irregular rate and rhythm, no murmurs, rubs or gallops, PMI not laterally displaced GI- soft, NT, ND, + BS Extremities- no clubbing, cyanosis, or edema MS- no significant deformity or atrophy Skin- no rash or lesion Psych- euthymic mood, full affect Neuro- strength and sensation are intact  EKG- afib  With v rate of 99 bpm, LAD, st/t wave abnormality,  qrs int 100 ms, qtc 467 ms. Epic records reviewed   Assessment and Plan: 1. Symptomatic  Afib Increase metoprolol to 25 mg bid Continue cardizem 180 mg bid Add Lasix 20 mg one a day until cardioversion DCCV scheduled for  11/7. Reminded not to miss any doses of blood thinner Cbc/bmet today Will have to see going forward if pt  will require AAD therapy  2. HTN Mildly elevated Metoprolol increase/diuretic will help  3. OSA Using cpap religiously  4. Obesity Weight loss/exercise recommended   F/u in afib clinic one week after cardioversion  Butch Penny C. Damean Poffenberger, [facility] [address] [place],  [address] [phone]

## 2015-08-11 NOTE — Addendum Note (Signed)
Encounter addended by: Sherran Needs, NP on: 08/11/2015  3:20 PM<BR>     Documentation filed: Notes Section

## 2015-08-12 ENCOUNTER — Encounter: Payer: Medicare Other | Admitting: Physician Assistant

## 2015-08-13 ENCOUNTER — Ambulatory Visit (HOSPITAL_COMMUNITY): Payer: Medicare Other | Admitting: Nurse Practitioner

## 2015-08-16 ENCOUNTER — Encounter (HOSPITAL_COMMUNITY)
Admission: RE | Disposition: A | Discharge status: Home / Self Care | Payer: Self-pay | Source: Ambulatory Visit | Attending: Cardiology

## 2015-08-16 ENCOUNTER — Ambulatory Visit (HOSPITAL_COMMUNITY): Payer: Medicare Other | Admitting: Anesthesiology

## 2015-08-16 ENCOUNTER — Ambulatory Visit (HOSPITAL_COMMUNITY)
Admission: RE | Admit: 2015-08-16 | Discharge: 2015-08-16 | Disposition: A | Discharge status: Home / Self Care | Payer: Medicare Other | Source: Ambulatory Visit | Attending: Cardiology | Admitting: Cardiology

## 2015-08-16 ENCOUNTER — Encounter (HOSPITAL_COMMUNITY): Payer: Self-pay | Admitting: *Deleted

## 2015-08-16 ENCOUNTER — Ambulatory Visit (HOSPITAL_COMMUNITY): Payer: Medicare Other | Admitting: Nurse Practitioner

## 2015-08-16 DIAGNOSIS — E785 Hyperlipidemia, unspecified: Secondary | ICD-10-CM | POA: Insufficient documentation

## 2015-08-16 DIAGNOSIS — Z7984 Long term (current) use of oral hypoglycemic drugs: Secondary | ICD-10-CM | POA: Diagnosis not present

## 2015-08-16 DIAGNOSIS — Z7901 Long term (current) use of anticoagulants: Secondary | ICD-10-CM | POA: Diagnosis not present

## 2015-08-16 DIAGNOSIS — I480 Paroxysmal atrial fibrillation: Secondary | ICD-10-CM | POA: Insufficient documentation

## 2015-08-16 DIAGNOSIS — E669 Obesity, unspecified: Secondary | ICD-10-CM | POA: Diagnosis not present

## 2015-08-16 DIAGNOSIS — Z79899 Other long term (current) drug therapy: Secondary | ICD-10-CM | POA: Insufficient documentation

## 2015-08-16 DIAGNOSIS — I10 Essential (primary) hypertension: Secondary | ICD-10-CM | POA: Diagnosis not present

## 2015-08-16 DIAGNOSIS — G4733 Obstructive sleep apnea (adult) (pediatric): Secondary | ICD-10-CM | POA: Diagnosis not present

## 2015-08-16 DIAGNOSIS — E119 Type 2 diabetes mellitus without complications: Secondary | ICD-10-CM | POA: Diagnosis not present

## 2015-08-16 DIAGNOSIS — I4891 Unspecified atrial fibrillation: Secondary | ICD-10-CM | POA: Diagnosis not present

## 2015-08-16 HISTORY — PX: CARDIOVERSION: SHX1299

## 2015-08-16 LAB — GLUCOSE, CAPILLARY: GLUCOSE-CAPILLARY: 171 mg/dL — AB (ref 65–99)

## 2015-08-16 SURGERY — CARDIOVERSION
Anesthesia: Monitor Anesthesia Care

## 2015-08-16 MED ORDER — PROPOFOL 10 MG/ML IV BOLUS
INTRAVENOUS | Status: DC | PRN
  Administered 2015-08-16: 80 mg via INTRAVENOUS

## 2015-08-16 MED ORDER — LIDOCAINE HCL (CARDIAC) 20 MG/ML IV SOLN
INTRAVENOUS | Status: DC | PRN
  Administered 2015-08-16: 60 mg via INTRAVENOUS

## 2015-08-16 MED ORDER — SODIUM CHLORIDE 0.9 % IV SOLN
INTRAVENOUS | Status: DC
  Administered 2015-08-16: 10:00:00 via INTRAVENOUS

## 2015-08-16 NOTE — Discharge Instructions (Signed)
Electrical Cardioversion, Care After °Refer to this sheet in the next few weeks. These instructions provide you with information on caring for yourself after your procedure. Your health care provider may also give you more specific instructions. Your treatment has been planned according to current medical practices, but problems sometimes occur. Call your health care provider if you have any problems or questions after your procedure. °WHAT TO EXPECT AFTER THE PROCEDURE °After your procedure, it is typical to have the following sensations: °· Some redness on the skin where the shocks were delivered. If this is tender, a sunburn lotion or hydrocortisone cream may help. °· Possible return of an abnormal heart rhythm within hours or days after the procedure. °HOME CARE INSTRUCTIONS °· Take medicines only as directed by your health care provider. Be sure you understand how and when to take your medicine. °· Learn how to feel your pulse and check it often. °· Limit your activity for 48 hours after the procedure or as directed by your health care provider. °· Avoid or minimize caffeine and other stimulants as directed by your health care provider. °SEEK MEDICAL CARE IF: °· You feel like your heart is beating too fast or your pulse is not regular. °· You have any questions about your medicines. °· You have bleeding that will not stop. °SEEK IMMEDIATE MEDICAL CARE IF: °· You are dizzy or feel faint. °· It is hard to breathe or you feel short of breath. °· There is a change in discomfort in your chest. °· Your speech is slurred or you have trouble moving an arm or leg on one side of your body. °· You get a serious muscle cramp that does not go away. °· Your fingers or toes turn cold or blue. °  °This information is not intended to replace advice given to you by your health care provider. Make sure you discuss any questions you have with your health care provider. °  °Document Released: 07/16/2013 Document Revised: 10/16/2014  Document Reviewed: 07/16/2013 °Elsevier Interactive Patient Education ©2016 [employer] ° °

## 2015-08-16 NOTE — Transfer of Care (Signed)
Immediate Anesthesia Transfer of Care Note  Patient: Doris Jefferson  Procedure(s) Performed: Procedure(s): CARDIOVERSION (N/A)  Patient Location: Endoscopy Unit  Anesthesia Type:MAC  Level of Consciousness: awake, alert  and oriented  Airway & Oxygen Therapy: Patient Spontanous Breathing and Patient connected to nasal cannula oxygen  Post-op Assessment: Report given to RN and Post -op Vital signs reviewed and stable  Post vital signs: Reviewed and stable  Last Vitals:  Filed Vitals:   08/16/15 1017  BP: 177/96  Pulse: 93  Temp: 36.9 C  Resp: 16    Complications: No apparent anesthesia complications

## 2015-08-16 NOTE — H&P (View-Only) (Signed)
Patient ID: Doris Jefferson, female   DOB: 07/03/1941, 74 y.o.   MRN: [id number]     Primary Care Physician: Stephens Shire, MD Referring Physician: [facility] f/u   Doris Jefferson is a 74 y.o. female with a h/o PAF that had onset of afib 18 months ago and had short term treatment with amiodarone and then had cardioversion. Blood thinner was stopped several months after maintaining SR.   This past Saturday, she was clipping some shrubbery and started feeling hot and sweaty. She proceeded to the er and was found to be in afib with rvr at 125 bpm. She thinks she may have been in afib for about a week . Labs checked and unremarkable. Cardizem was increased to 180 mg bid , Lotensin decreased to 10 mg a day.  She returned 10/11 with afib at 113 bpm. Felt better. Initiated Eliquis 5 mg bid with a chadsvasc score of at least 4. Has OSA and sleeps with cpap. No alcohol, tobacco or high caffeine use. No regular exercise.  She is here today with increased dyspnea with exertion, 5 lb weight gain and pedal edema. Resting heart rate is high 90's. She has now been anticoagulated x 3 weeks and will plan on cardioversion.   Today, she denies symptoms of palpitations, chest pain, shortness of breath, orthopnea, PND, lower extremity edema, dizziness, presyncope, syncope, or neurologic sequela. The patient is tolerating medications without difficulties and is otherwise without complaint today.   Past Medical History  Diagnosis Date  . Heart murmur   . Diabetes ([place])   . Hyperlipidemia   . OSA (obstructive sleep apnea)     severe with AHI 31/hr  . HTN (hypertension)   . PAF (paroxysmal atrial fibrillation) ([place])   . Obesity (BMI 30-39.9) 03/04/2014  . Anemia    Past Surgical History  Procedure Laterality Date  . Cardioversion N/A 12/19/2013    Procedure: CARDIOVERSION;  Surgeon: Sinclair Grooms, MD;  Location: [place];  Service: Cardiovascular;  Laterality: N/A;  . Eye surgery    . Abdominal hysterectomy      . Spine surgery      Current Outpatient Prescriptions  Medication Sig Dispense Refill  . apixaban (ELIQUIS) 5 MG TABS tablet Take 1 tablet (5 mg total) by mouth 2 (two) times daily. 60 tablet 0  . benazepril (LOTENSIN) 20 MG tablet Take 20 mg by mouth daily. Pt is taking 10 mg (half a tablet) daily    . diltiazem (CARDIZEM CD) 180 MG 24 hr capsule Take 1 capsule (180 mg total) by mouth 2 (two) times daily. 60 capsule 6  . glimepiride (AMARYL) 4 MG tablet Take 4 mg by mouth daily with breakfast.    . Liraglutide (VICTOZA [place]) Inject 1.8 mg into the skin daily.     . metFORMIN (GLUCOPHAGE) 1000 MG tablet Take 1,000 mg by mouth 2 (two) times daily with a meal.    . metoprolol succinate (TOPROL-XL) 25 MG 24 hr tablet Take 1 tablet (25 mg total) by mouth 2 (two) times daily. 60 tablet 3  . Omega-3 Fatty Acids (FISH OIL) 1000 MG CAPS Take 1 capsule by mouth daily.    [name] PARoxetine (PAXIL) 20 MG tablet Take 20 mg by mouth daily.    . rosuvastatin (CRESTOR) 20 MG tablet Take 20 mg by mouth daily.    [name] terazosin (HYTRIN) 5 MG capsule Take 5 mg by mouth at bedtime.    . furosemide (LASIX) 20 MG tablet Take 1 tablet (  20 mg total) by mouth daily. 30 tablet 6   No current facility-administered medications for this encounter.    Allergies  Allergen Reactions  . Minocin [Minocycline Hcl] Other (See Comments)    Throat swelling  . Tape Rash    Social History   Social History  . Marital Status: Married    Spouse Name: N/A  . Number of Children: N/A  . Years of Education: N/A   Occupational History  . Not on file.   Social History Main Topics  . Smoking status: Never Smoker   . Smokeless tobacco: Not on file  . Alcohol Use: No  . Drug Use: No  . Sexual Activity: Not on file   Other Topics Concern  . Not on file   Social History Narrative    Family History  Problem Relation Age of Onset  . Hypertension Father   . Heart disease Father   . Melanoma Mother   . Hypertension Brother      ROS- All systems are reviewed and negative except as per the HPI above  Physical Exam: Filed Vitals:   08/11/15 1430  BP: 146/68  Pulse: 99  Height: 5\' 3"  (1.6 m)  Weight: 235 lb 6.4 oz (106.777 kg)    GEN- The patient is well appearing, alert and oriented x 3 today.   Head- normocephalic, atraumatic Eyes-  Sclera clear, conjunctiva pink Ears- hearing intact Oropharynx- clear Neck- supple, no JVP Lymph- no cervical lymphadenopathy Lungs- Clear to ausculation bilaterally, normal work of breathing Heart- Irregular rate and rhythm, no murmurs, rubs or gallops, PMI not laterally displaced GI- soft, NT, ND, + BS Extremities- no clubbing, cyanosis, or edema MS- no significant deformity or atrophy Skin- no rash or lesion Psych- euthymic mood, full affect Neuro- strength and sensation are intact  EKG- afib  With v rate of 99 bpm, LAD, st/t wave abnormality,  qrs int 100 ms, qtc 467 ms. Epic records reviewed   Assessment and Plan: 1. Symptomatic  Afib Increase metoprolol to 25 mg bid Continue cardizem 180 mg bid Add Lasix 20 mg one a day until cardioversion DCCV scheduled for  11/7. Reminded not to miss any doses of blood thinner Cbc/bmet today Will have to see going forward if pt  will require AAD therapy  2. HTN Mildly elevated Metoprolol increase/diuretic will help  3. OSA Using cpap religiously  4. Obesity Weight loss/exercise recommended   F/u in afib clinic one week after cardioversion  Butch Penny C. Shalon Salado, [facility] [address] [place], [place] [address] [phone]

## 2015-08-16 NOTE — Anesthesia Postprocedure Evaluation (Signed)
  Anesthesia Post-op Note  Patient: Doris Jefferson  Procedure(s) Performed: Procedure(s) (LRB): CARDIOVERSION (N/A)  Patient Location: PACU  Anesthesia Type: MAC  Level of Consciousness: awake and alert   Airway and Oxygen Therapy: Patient Spontanous Breathing  Post-op Pain: mild  Post-op Assessment: Post-op Vital signs reviewed, Patient's Cardiovascular Status Stable, Respiratory Function Stable, Patent Airway and No signs of Nausea or vomiting  Last Vitals:  Filed Vitals:   08/16/15 1125  BP: 138/69  Pulse: 82  Temp:   Resp: 15    Post-op Vital Signs: stable   Complications: No apparent anesthesia complications

## 2015-08-16 NOTE — Procedures (Signed)
Electrical Cardioversion Procedure Note Doris Jefferson [id number] 07-21-1941  Procedure: Electrical Cardioversion Indications:  Atrial Fibrillation  Procedure Details Consent: Risks of procedure as well as the alternatives and risks of each were explained to the (patient/caregiver).  Consent for procedure obtained. Time Out: Verified patient identification, verified procedure, site/side was marked, verified correct patient position, special equipment/implants available, medications/allergies/relevent history reviewed, required imaging and test results available.  Performed  Patient placed on cardiac monitor, pulse oximetry, supplemental oxygen as necessary.  Sedation given: Propofol Pacer pads placed anterior and posterior chest.  Cardioverted 1 time(s).  Cardioverted at [place].  Evaluation Findings: Post procedure EKG shows: NSR Complications: None Patient did tolerate procedure well.   [name] 08/16/2015, 10:58 AM

## 2015-08-16 NOTE — Interval H&P Note (Signed)
History and Physical Interval Note:  08/16/2015 10:54 AM  Doris Jefferson  has presented today for surgery, with the diagnosis of AFIB  The various methods of treatment have been discussed with the patient and family. After consideration of risks, benefits and other options for treatment, the patient has consented to  Procedure(s): CARDIOVERSION (N/A) as a surgical intervention .  The patient's history has been reviewed, patient examined, no change in status, stable for surgery.  I have reviewed the patient's chart and labs.  Questions were answered to the patient's satisfaction.     Doris Jefferson [employer]

## 2015-08-16 NOTE — Anesthesia Preprocedure Evaluation (Signed)
Anesthesia Evaluation  Patient identified by MRN, date of birth, ID band Patient awake    Reviewed: Allergy & Precautions, NPO status , Patient's Chart, lab work & pertinent test results  Airway Mallampati: II  TM Distance: >3 FB Neck ROM: Full    Dental no notable dental hx.    Pulmonary sleep apnea ,    Pulmonary exam normal breath sounds clear to auscultation       Cardiovascular hypertension, + dysrhythmias Atrial Fibrillation + Valvular Problems/Murmurs AS  Rhythm:Irregular Rate:Normal + Systolic murmurs    Neuro/Psych negative neurological ROS  negative psych ROS   GI/Hepatic negative GI ROS, Neg liver ROS,   Endo/Other  diabetesMorbid obesity  Renal/GU negative Renal ROS  negative genitourinary   Musculoskeletal negative musculoskeletal ROS (+)   Abdominal   Peds negative pediatric ROS (+)  Hematology negative hematology ROS (+)   Anesthesia Other Findings   Reproductive/Obstetrics negative OB ROS                             Anesthesia Physical Anesthesia Plan  ASA: III  Anesthesia Plan: MAC   Post-op Pain Management:    Induction: Intravenous  Airway Management Planned: Mask  Additional Equipment:   Intra-op Plan:   Post-operative Plan:   Informed Consent: I have reviewed the patients History and Physical, chart, labs and discussed the procedure including the risks, benefits and alternatives for the proposed anesthesia with the patient or authorized representative who has indicated his/her understanding and acceptance.   Dental advisory given  Plan Discussed with: CRNA and Surgeon  Anesthesia Plan Comments:         Anesthesia Quick Evaluation

## 2015-08-17 ENCOUNTER — Encounter (HOSPITAL_COMMUNITY): Payer: Self-pay | Admitting: Cardiology

## 2015-08-18 ENCOUNTER — Encounter: Payer: Self-pay | Admitting: Physician Assistant

## 2015-08-23 ENCOUNTER — Encounter (HOSPITAL_COMMUNITY): Payer: Self-pay | Admitting: Nurse Practitioner

## 2015-08-23 ENCOUNTER — Ambulatory Visit (HOSPITAL_COMMUNITY)
Admission: RE | Admit: 2015-08-23 | Discharge: 2015-08-23 | Disposition: A | Discharge status: Home / Self Care | Payer: Medicare Other | Source: Ambulatory Visit | Attending: Nurse Practitioner | Admitting: Nurse Practitioner

## 2015-08-23 VITALS — BP 148/58 | HR 82 | Ht 63.0 in | Wt 234.8 lb

## 2015-08-23 DIAGNOSIS — I4891 Unspecified atrial fibrillation: Secondary | ICD-10-CM | POA: Diagnosis present

## 2015-08-23 DIAGNOSIS — I4819 Other persistent atrial fibrillation: Secondary | ICD-10-CM

## 2015-08-23 DIAGNOSIS — E669 Obesity, unspecified: Secondary | ICD-10-CM | POA: Diagnosis not present

## 2015-08-23 DIAGNOSIS — I481 Persistent atrial fibrillation: Secondary | ICD-10-CM

## 2015-08-23 DIAGNOSIS — G4733 Obstructive sleep apnea (adult) (pediatric): Secondary | ICD-10-CM | POA: Diagnosis not present

## 2015-08-23 DIAGNOSIS — I10 Essential (primary) hypertension: Secondary | ICD-10-CM | POA: Insufficient documentation

## 2015-08-23 LAB — MAGNESIUM: Magnesium: 1.5 mg/dL — ABNORMAL LOW (ref 1.7–2.4)

## 2015-08-23 LAB — BASIC METABOLIC PANEL
Anion gap: 7 (ref 5–15)
BUN: 18 mg/dL (ref 6–20)
CHLORIDE: 108 mmol/L (ref 101–111)
CO2: 24 mmol/L (ref 22–32)
CREATININE: 0.95 mg/dL (ref 0.44–1.00)
Calcium: 9.1 mg/dL (ref 8.9–10.3)
GFR calc Af Amer: >60 mL/min (ref >60)
GFR calc non Af Amer: 58 mL/min — ABNORMAL LOW (ref >60)
GLUCOSE: 177 mg/dL — AB (ref 65–99)
Potassium: 4.1 mmol/L (ref 3.5–5.1)
Sodium: 139 mmol/L (ref 135–145)

## 2015-08-23 LAB — TSH: TSH: 1.715 u[IU]/mL (ref 0.350–4.500)

## 2015-08-23 NOTE — Progress Notes (Signed)
Patient ID: Doris Jefferson, female   DOB: 02-02-1941, 74 y.o.   MRN: [id number]     Primary Care Physician: Stephens Shire, MD Referring Physician: [facility] f/u Cardiologist: Dr. Margy Clarks is a 74 y.o. female with a h/o PAF that had onset of afib 18 months ago and had short term treatment with amiodarone and then had cardioversion. Amiodarone was stopped by Dr. Tamala Julian due to feeling now that pt had sleep apnea under control, that she would be able to maintain SR off drug. Blood thinner was stopped several months after maintaining SR. Few weeks ago,on a  Saturday, she was clipping some shrubbery and started feeling hot and sweaty. She proceeded to the ER and was found to be in afib with rvr at 125 bpm. She thinks she may have been in afib for about a week . Labs checked and unremarkable. Cardizem was increased to 180 mg bid , Lotensin decreased to 10 mg a day.  She returned 10/11 with afib at 113 bpm. Felt better. Initiated Eliquis 5 mg bid with a chadsvasc score of at least 4. Has OSA and sleeps with cpap. No alcohol, tobacco or high caffeine use. No regular exercise.  She returned 11/2 ,with increased dyspnea with exertion, 5 lb weight gain and pedal edema. Lasix 20 mg started. Resting heart rate is high 90's. She had been anticoagulated x 3 weeks and cardioversion scheduled. She did cardiovert successfully, but unfortunately, had ERAF.   Today, she denies symptoms of  chest pain, orthopnea, PND, lower extremity edema, dizziness, presyncope, syncope, or neurologic sequela. Positive for exertional dyspnea and fatigue. The patient is tolerating medications without difficulties and is otherwise without complaint today.   Past Medical History  Diagnosis Date  . Heart murmur   . Diabetes ([place])   . Hyperlipidemia   . OSA (obstructive sleep apnea)     severe with AHI 31/hr  . HTN (hypertension)   . PAF (paroxysmal atrial fibrillation) ([place])   . Obesity (BMI 30-39.9) 03/04/2014  . Anemia     Past Surgical History  Procedure Laterality Date  . Cardioversion N/A 12/19/2013    Procedure: CARDIOVERSION;  Surgeon: Sinclair Grooms, MD;  Location: [place];  Service: Cardiovascular;  Laterality: N/A;  . Eye surgery    . Abdominal hysterectomy    . Spine surgery    . Cardioversion N/A 08/16/2015    Procedure: CARDIOVERSION;  Surgeon: Larey Dresser, MD;  Location: [place];  Service: Cardiovascular;  Laterality: N/A;    Current Outpatient Prescriptions  Medication Sig Dispense Refill  . apixaban (ELIQUIS) 5 MG TABS tablet Take 1 tablet (5 mg total) by mouth 2 (two) times daily. 60 tablet 0  . benazepril (LOTENSIN) 20 MG tablet Take 20 mg by mouth daily. Pt is taking 10 mg (half a tablet) daily    . diltiazem (CARDIZEM CD) 180 MG 24 hr capsule Take 1 capsule (180 mg total) by mouth 2 (two) times daily. 60 capsule 6  . furosemide (LASIX) 20 MG tablet Take 1 tablet (20 mg total) by mouth daily. 30 tablet 6  . glimepiride (AMARYL) 4 MG tablet Take 4 mg by mouth daily with breakfast.    . Liraglutide (VICTOZA [place]) Inject 1.8 mg into the skin daily.     . metFORMIN (GLUCOPHAGE) 1000 MG tablet Take 1,000 mg by mouth 2 (two) times daily with a meal.    . metoprolol succinate (TOPROL-XL) 25 MG 24 hr tablet Take 1 tablet (  25 mg total) by mouth 2 (two) times daily. 60 tablet 3  . Omega-3 Fatty Acids (FISH OIL) 1000 MG CAPS Take 1 capsule by mouth daily.    [name] PARoxetine (PAXIL) 20 MG tablet Take 20 mg by mouth daily.    . rosuvastatin (CRESTOR) 20 MG tablet Take 20 mg by mouth daily.    [name] terazosin (HYTRIN) 5 MG capsule Take 5 mg by mouth at bedtime.     No current facility-administered medications for this encounter.    Allergies  Allergen Reactions  . Minocin [Minocycline Hcl] Other (See Comments)    Throat swelling  . Tape Rash    Social History   Social History  . Marital Status: Married    Spouse Name: N/A  . Number of Children: N/A  . Years of Education: N/A    Occupational History  . Not on file.   Social History Main Topics  . Smoking status: Never Smoker   . Smokeless tobacco: Not on file  . Alcohol Use: No  . Drug Use: No  . Sexual Activity: Not on file   Other Topics Concern  . Not on file   Social History Narrative    Family History  Problem Relation Age of Onset  . Hypertension Father   . Heart disease Father   . Melanoma Mother   . Hypertension Brother     ROS- All systems are reviewed and negative except as per the HPI above  Physical Exam: Filed Vitals:   08/23/15 1432  BP: 148/58  Pulse: 82  Height: 5\' 3"  (1.6 m)  Weight: 234 lb 12.8 oz (106.505 kg)  SpO2: 99%    GEN- The patient is well appearing, alert and oriented x 3 today.   Head- normocephalic, atraumatic Eyes-  Sclera clear, conjunctiva pink Ears- hearing intact Oropharynx- clear Neck- supple, no JVP Lymph- no cervical lymphadenopathy Lungs- Clear to ausculation bilaterally, normal work of breathing Heart- Irregular rate and rhythm, no murmurs, rubs or gallops, PMI not laterally displaced GI- soft, NT, ND, + BS Extremities- no clubbing, cyanosis, or edema MS- no significant deformity or atrophy Skin- no rash or lesion Psych- euthymic mood, full affect Neuro- strength and sensation are intact  EKG- afib  with v rate of 82 bpm, LAD, st/t wave abnormality,  qrs int 96 ms, qtc 434 ms. Epic records reviewed   Assessment and Plan: 1. Symptomatic  Afib ERAF s/p cardioversion Continue metoprolol to 25 mg bid Continue cardizem 180 mg bid  Continue Lasix 20 mg one a day  Discussed with pt AAD therapy with either amiodarone or tiksoyn. She would like to try tiksoyn. Baseline qtc is 234 ms, but is on paxil which may have to be addressed. Will set up an appointment with Elberta Leatherwood, PharmD, to further arrange tiksoyn.  Bmet, mag, tsh today Reminded not to miss any doses of blood thinner  2. HTN Still mildly elevated despite recent addition of  Metoprolol,start of diuretic  3. OSA Using cpap religiously  4. Obesity Weight loss/exercise recommended    F/u afib clinic as needed  Doris Jefferson, [facility] [address] [place], [place] [address] [phone]

## 2015-08-23 NOTE — Patient Instructions (Signed)
Gay Filler will be in touch regarding appointment to discuss tikosyn

## 2015-08-24 ENCOUNTER — Other Ambulatory Visit (HOSPITAL_COMMUNITY): Payer: Self-pay | Admitting: *Deleted

## 2015-08-24 MED ORDER — MAGNESIUM OXIDE 400 (241.3 MG) MG PO TABS: 400.0000 mg | ORAL_TABLET | Freq: Every day | ORAL | Status: DC

## 2015-09-06 ENCOUNTER — Inpatient Hospital Stay (HOSPITAL_COMMUNITY)
Admission: AD | Admit: 2015-09-06 | Discharge: 2015-09-13 | DRG: 309 | Disposition: A | Discharge status: Home / Self Care | Payer: Medicare Other | Source: Ambulatory Visit | Attending: Cardiology | Admitting: Cardiology

## 2015-09-06 ENCOUNTER — Encounter: Payer: Self-pay | Admitting: Pharmacist

## 2015-09-06 ENCOUNTER — Ambulatory Visit (INDEPENDENT_AMBULATORY_CARE_PROVIDER_SITE_OTHER): Payer: Medicare Other | Admitting: Pharmacist

## 2015-09-06 VITALS — Wt 241.8 lb

## 2015-09-06 DIAGNOSIS — R011 Cardiac murmur, unspecified: Secondary | ICD-10-CM | POA: Diagnosis present

## 2015-09-06 DIAGNOSIS — D649 Anemia, unspecified: Secondary | ICD-10-CM | POA: Diagnosis present

## 2015-09-06 DIAGNOSIS — Z888 Allergy status to other drugs, medicaments and biological substances status: Secondary | ICD-10-CM

## 2015-09-06 DIAGNOSIS — Z5181 Encounter for therapeutic drug level monitoring: Secondary | ICD-10-CM | POA: Diagnosis not present

## 2015-09-06 DIAGNOSIS — E119 Type 2 diabetes mellitus without complications: Secondary | ICD-10-CM | POA: Diagnosis present

## 2015-09-06 DIAGNOSIS — Z6841 Body Mass Index (BMI) 40.0 and over, adult: Secondary | ICD-10-CM | POA: Diagnosis not present

## 2015-09-06 DIAGNOSIS — Z8249 Family history of ischemic heart disease and other diseases of the circulatory system: Secondary | ICD-10-CM

## 2015-09-06 DIAGNOSIS — I4891 Unspecified atrial fibrillation: Secondary | ICD-10-CM | POA: Diagnosis not present

## 2015-09-06 DIAGNOSIS — Z7984 Long term (current) use of oral hypoglycemic drugs: Secondary | ICD-10-CM | POA: Diagnosis not present

## 2015-09-06 DIAGNOSIS — I4819 Other persistent atrial fibrillation: Secondary | ICD-10-CM | POA: Diagnosis present

## 2015-09-06 DIAGNOSIS — Z79899 Other long term (current) drug therapy: Secondary | ICD-10-CM | POA: Diagnosis not present

## 2015-09-06 DIAGNOSIS — E876 Hypokalemia: Secondary | ICD-10-CM | POA: Diagnosis present

## 2015-09-06 DIAGNOSIS — I4901 Ventricular fibrillation: Secondary | ICD-10-CM | POA: Diagnosis not present

## 2015-09-06 DIAGNOSIS — I481 Persistent atrial fibrillation: Secondary | ICD-10-CM | POA: Diagnosis not present

## 2015-09-06 DIAGNOSIS — I480 Paroxysmal atrial fibrillation: Principal | ICD-10-CM | POA: Diagnosis present

## 2015-09-06 DIAGNOSIS — T462X5A Adverse effect of other antidysrhythmic drugs, initial encounter: Secondary | ICD-10-CM | POA: Diagnosis not present

## 2015-09-06 DIAGNOSIS — Z91048 Other nonmedicinal substance allergy status: Secondary | ICD-10-CM | POA: Diagnosis not present

## 2015-09-06 DIAGNOSIS — R52 Pain, unspecified: Secondary | ICD-10-CM

## 2015-09-06 DIAGNOSIS — E785 Hyperlipidemia, unspecified: Secondary | ICD-10-CM | POA: Diagnosis present

## 2015-09-06 DIAGNOSIS — I10 Essential (primary) hypertension: Secondary | ICD-10-CM | POA: Diagnosis present

## 2015-09-06 DIAGNOSIS — I472 Ventricular tachycardia: Secondary | ICD-10-CM | POA: Diagnosis not present

## 2015-09-06 DIAGNOSIS — Z7901 Long term (current) use of anticoagulants: Secondary | ICD-10-CM | POA: Diagnosis not present

## 2015-09-06 DIAGNOSIS — G4733 Obstructive sleep apnea (adult) (pediatric): Secondary | ICD-10-CM | POA: Diagnosis present

## 2015-09-06 DIAGNOSIS — E669 Obesity, unspecified: Secondary | ICD-10-CM | POA: Diagnosis present

## 2015-09-06 LAB — BASIC METABOLIC PANEL
BUN: 21 mg/dL (ref 7–25)
CO2: 23 mmol/L (ref 20–31)
Calcium: 9.1 mg/dL (ref 8.6–10.4)
Chloride: 106 mmol/L (ref 98–110)
Creat: 1 mg/dL — ABNORMAL HIGH (ref 0.60–0.93)
GLUCOSE: 312 mg/dL — AB (ref 65–99)
POTASSIUM: 4.8 mmol/L (ref 3.5–5.3)
SODIUM: 141 mmol/L (ref 135–146)

## 2015-09-06 LAB — MAGNESIUM
MAGNESIUM: 1.6 mg/dL (ref 1.5–2.5)
Magnesium: 2.2 mg/dL (ref 1.7–2.4)

## 2015-09-06 LAB — GLUCOSE, CAPILLARY: GLUCOSE-CAPILLARY: 192 mg/dL — AB (ref 65–99)

## 2015-09-06 MED ORDER — GLIMEPIRIDE 4 MG PO TABS
4.0000 mg | ORAL_TABLET | Freq: Every day | ORAL | Status: DC
  Administered 2015-09-07 – 2015-09-13 (×7): 4 mg via ORAL
  Filled 2015-09-06 (×8): qty 1

## 2015-09-06 MED ORDER — SODIUM CHLORIDE 0.9 % IJ SOLN
3.0000 mL | Freq: Two times a day (BID) | INTRAMUSCULAR | Status: DC
  Administered 2015-09-06 – 2015-09-09 (×6): 3 mL via INTRAVENOUS
  Administered 2015-09-09: 10 mL via INTRAVENOUS
  Administered 2015-09-10 – 2015-09-12 (×5): 3 mL via INTRAVENOUS

## 2015-09-06 MED ORDER — SODIUM CHLORIDE 0.9 % IJ SOLN: 3.0000 mL | INTRAMUSCULAR | Status: DC | PRN

## 2015-09-06 MED ORDER — BENAZEPRIL HCL 10 MG PO TABS
10.0000 mg | ORAL_TABLET | Freq: Every day | ORAL | Status: DC
  Administered 2015-09-07 – 2015-09-13 (×7): 10 mg via ORAL
  Filled 2015-09-06 (×11): qty 1

## 2015-09-06 MED ORDER — FUROSEMIDE 20 MG PO TABS
20.0000 mg | ORAL_TABLET | Freq: Every day | ORAL | Status: DC
  Administered 2015-09-07 – 2015-09-13 (×7): 20 mg via ORAL
  Filled 2015-09-06 (×7): qty 1

## 2015-09-06 MED ORDER — METOPROLOL SUCCINATE ER 25 MG PO TB24
25.0000 mg | ORAL_TABLET | Freq: Two times a day (BID) | ORAL | Status: DC
  Administered 2015-09-06 – 2015-09-13 (×14): 25 mg via ORAL
  Filled 2015-09-06 (×14): qty 1

## 2015-09-06 MED ORDER — INSULIN ASPART 100 UNIT/ML ~~LOC~~ SOLN
0.0000 [IU] | Freq: Three times a day (TID) | SUBCUTANEOUS | Status: DC
  Administered 2015-09-07: 1 [IU] via SUBCUTANEOUS
  Administered 2015-09-07 (×2): 2 [IU] via SUBCUTANEOUS
  Administered 2015-09-08 (×2): 3 [IU] via SUBCUTANEOUS
  Administered 2015-09-08 – 2015-09-09 (×3): 2 [IU] via SUBCUTANEOUS
  Administered 2015-09-10: 1 [IU] via SUBCUTANEOUS
  Administered 2015-09-10 – 2015-09-11 (×3): 2 [IU] via SUBCUTANEOUS
  Administered 2015-09-11: 3 [IU] via SUBCUTANEOUS
  Administered 2015-09-11 – 2015-09-12 (×2): 2 [IU] via SUBCUTANEOUS
  Administered 2015-09-12 – 2015-09-13 (×3): 1 [IU] via SUBCUTANEOUS

## 2015-09-06 MED ORDER — METFORMIN HCL 500 MG PO TABS
1000.0000 mg | ORAL_TABLET | Freq: Two times a day (BID) | ORAL | Status: DC
  Administered 2015-09-06 – 2015-09-13 (×14): 1000 mg via ORAL
  Filled 2015-09-06 (×14): qty 2

## 2015-09-06 MED ORDER — MAGNESIUM SULFATE 2 GM/50ML IV SOLN
2.0000 g | Freq: Once | INTRAVENOUS | Status: AC
  Administered 2015-09-06: 2 g via INTRAVENOUS
  Filled 2015-09-06: qty 50

## 2015-09-06 MED ORDER — APIXABAN 5 MG PO TABS
5.0000 mg | ORAL_TABLET | Freq: Two times a day (BID) | ORAL | Status: DC
  Administered 2015-09-06 – 2015-09-13 (×14): 5 mg via ORAL
  Filled 2015-09-06 (×14): qty 1

## 2015-09-06 MED ORDER — DILTIAZEM HCL ER COATED BEADS 180 MG PO CP24
180.0000 mg | ORAL_CAPSULE | Freq: Two times a day (BID) | ORAL | Status: DC
  Administered 2015-09-06 – 2015-09-13 (×14): 180 mg via ORAL
  Filled 2015-09-06 (×20): qty 1

## 2015-09-06 MED ORDER — PAROXETINE HCL 20 MG PO TABS
20.0000 mg | ORAL_TABLET | Freq: Every day | ORAL | Status: DC
  Administered 2015-09-07 – 2015-09-08 (×2): 20 mg via ORAL
  Filled 2015-09-06 (×2): qty 1

## 2015-09-06 MED ORDER — TERAZOSIN HCL 5 MG PO CAPS
5.0000 mg | ORAL_CAPSULE | Freq: Every day | ORAL | Status: DC
  Administered 2015-09-07 – 2015-09-12 (×6): 5 mg via ORAL
  Filled 2015-09-06 (×9): qty 1

## 2015-09-06 MED ORDER — DOFETILIDE 500 MCG PO CAPS
500.0000 ug | ORAL_CAPSULE | Freq: Two times a day (BID) | ORAL | Status: DC
  Administered 2015-09-06 – 2015-09-08 (×4): 500 ug via ORAL
  Filled 2015-09-06 (×4): qty 1

## 2015-09-06 MED ORDER — LIRAGLUTIDE 18 MG/3ML ~~LOC~~ SOPN
1.8000 mg | PEN_INJECTOR | Freq: Every day | SUBCUTANEOUS | Status: DC
  Administered 2015-09-07 – 2015-09-13 (×7): 1.8 mg via SUBCUTANEOUS

## 2015-09-06 MED ORDER — ROSUVASTATIN CALCIUM 10 MG PO TABS
20.0000 mg | ORAL_TABLET | Freq: Every day | ORAL | Status: DC
  Administered 2015-09-06 – 2015-09-13 (×8): 20 mg via ORAL
  Filled 2015-09-06: qty 1
  Filled 2015-09-06 (×8): qty 2

## 2015-09-06 MED ORDER — SODIUM CHLORIDE 0.9 % IV SOLN: 250.0000 mL | INTRAVENOUS | Status: DC | PRN

## 2015-09-06 MED ORDER — MAGNESIUM OXIDE 400 (241.3 MG) MG PO TABS
400.0000 mg | ORAL_TABLET | Freq: Every day | ORAL | Status: DC
  Administered 2015-09-07 – 2015-09-13 (×7): 400 mg via ORAL
  Filled 2015-09-06 (×8): qty 1

## 2015-09-06 NOTE — H&P (Signed)
H&P    Patient ID: Doris Jefferson MRN: [id number], DOB/AGE: Jul 24, 1941 74 y.o.  Admit date: 09/06/2015 Date of Consult: 09/06/2015   Primary Physician: Stephens Shire, MD Primary Cardiologist: Dr. Tamala Julian  Reason for Admission: Tikosyn load  HPI: Doris Jefferson is a 74 y.o. female with PMHx of PAFib, very symptomatic, HTN, DM, obesity, OSA (treated), HLD, is admitted to undergo Tikosyn load.  Her PAF history is onset of afib 18 months ago and had short term treatment with amiodarone and then had cardioversion. Amiodarone was stopped by Dr. Tamala Julian due to feeling now that pt had sleep apnea under control, that she would be able to maintain SR off drug. Blood thinner was stopped several months after maintaining SR. Few weeks ago,on a Saturday, she was clipping some shrubbery and started feeling hot and sweaty. She proceeded to the ER and was found to be in afib with rvr at 125 bpm, labs were checked and unremarkable. Cardizem was increased to 180 mg bid , Lotensin decreased to 10 mg a day. She returned 10/11 with afib at 113 bpm. Felt better. Initiated Eliquis 5 mg bid with a chadsvasc score of at least 4. Has OSA and sleeps with cpap. No alcohol, tobacco or high caffeine use. No regular exercise. She returned 11/2 ,with increased dyspnea with exertion, 5 lb weight gain and pedal edema. Lasix 20 mg started. Resting heart rate is high 90's. She had been anticoagulated x 3 weeks and cardioversion scheduled. She did cardiovert successfully, but unfortunately, had ERAF.   She feels well today, with her AF though she has exertional intolerances, no CP, no near syncope or syncope.  She denies any recent illnesses, fever, no N/V/D.  The patient was seen by Fuller Canada, RPH in preparation, noting Paroxetine, though with stable QTC <466ms, did not recommend dose adjustment or d/c at this time.  Past Medical History  Diagnosis Date  . Heart murmur   . Diabetes ([place])   . Hyperlipidemia   . OSA  (obstructive sleep apnea)     severe with AHI 31/hr  . HTN (hypertension)   . PAF (paroxysmal atrial fibrillation) ([place])   . Obesity (BMI 30-39.9) 03/04/2014  . Anemia      Surgical History:  Past Surgical History  Procedure Laterality Date  . Cardioversion N/A 12/19/2013    Procedure: CARDIOVERSION;  Surgeon: Sinclair Grooms, MD;  Location: [facility];  Service: Cardiovascular;  Laterality: N/A;  . Eye surgery    . Abdominal hysterectomy    . Spine surgery    . Cardioversion N/A 08/16/2015    Procedure: CARDIOVERSION;  Surgeon: Larey Dresser, MD;  Location: [place];  Service: Cardiovascular;  Laterality: N/A;     Prescriptions prior to admission  Medication Sig Dispense Refill Last Dose  . apixaban (ELIQUIS) 5 MG TABS tablet Take 1 tablet (5 mg total) by mouth 2 (two) times daily. 60 tablet 0 Taking  . benazepril (LOTENSIN) 20 MG tablet Take 20 mg by mouth daily. Pt is taking 10 mg (half a tablet) daily   Taking  . diltiazem (CARDIZEM CD) 180 MG 24 hr capsule Take 1 capsule (180 mg total) by mouth 2 (two) times daily. 60 capsule 6 Taking  . furosemide (LASIX) 20 MG tablet Take 1 tablet (20 mg total) by mouth daily. 30 tablet 6 Taking  . glimepiride (AMARYL) 4 MG tablet Take 4 mg by mouth daily with breakfast.   Taking  . Liraglutide (VICTOZA [place]) Inject 1.8 mg  into the skin daily.    Taking  . magnesium oxide (MAGNESIUM-OXIDE) 400 (241.3 MG) MG tablet Take 1 tablet (400 mg total) by mouth daily. 60 tablet 3   . metFORMIN (GLUCOPHAGE) 1000 MG tablet Take 1,000 mg by mouth 2 (two) times daily with a meal.   Taking  . metoprolol succinate (TOPROL-XL) 25 MG 24 hr tablet Take 1 tablet (25 mg total) by mouth 2 (two) times daily. 60 tablet 3 Taking  . Omega-3 Fatty Acids (FISH OIL) 1000 MG CAPS Take 1 capsule by mouth daily.   Taking  . PARoxetine (PAXIL) 20 MG tablet Take 20 mg by mouth daily.   Taking  . rosuvastatin (CRESTOR) 20 MG tablet Take 20 mg by mouth daily.   Taking  .  terazosin (HYTRIN) 5 MG capsule Take 5 mg by mouth at bedtime.   Taking    Inpatient Medications:   Allergies:  Allergies  Allergen Reactions  . Minocin [Minocycline Hcl] Other (See Comments)    Throat swelling  . Tape Rash    Social History   Social History  . Marital Status: Married    Spouse Name: N/A  . Number of Children: N/A  . Years of Education: N/A   Occupational History  . Not on file.   Social History Main Topics  . Smoking status: Never Smoker   . Smokeless tobacco: Not on file  . Alcohol Use: No  . Drug Use: No  . Sexual Activity: Not on file   Other Topics Concern  . Not on file   Social History Narrative     Family History  Problem Relation Age of Onset  . Hypertension Father   . Heart disease Father   . Melanoma Mother   . Hypertension Brother      Review of Systems: All other systems reviewed and are otherwise negative except as noted above.  Physical Exam: Filed Vitals:   09/06/15 1534  BP: 139/57  Pulse: 68  Temp: 98 F (36.7 C)  TempSrc: Oral  Resp: 20  SpO2: 99%    GEN- The patient is obese, appears in NAD, alert and oriented x 3 today.   HEENT: normocephalic, atraumatic; sclera clear, conjunctiva pink; hearing intact; oropharynx clear; neck supple, no JVP Lymph- no cervical lymphadenopathy Lungs- Clear to ausculation bilaterally, normal work of breathing.  No wheezes, rales, rhonchi Heart- irregular-irregular rate and rhythm, no murmurs, rubs or gallops, PMI not laterally displaced GI- soft, non-tender, non-distended, bowel sounds present Extremities- no clubbing, cyanosis, or edema; DP/PT/radial pulses 2+ bilaterally MS- no significant deformity or atrophy Skin- warm and dry, no rash or lesion Psych- euthymic mood, full affect Neuro- no gross deficits observed  Labs:   Lab Results  Component Value Date   WBC 9.1 08/11/2015   HGB 10.4* 08/11/2015   HCT 33.9* 08/11/2015   MCV 86.0 08/11/2015   PLT 185 08/11/2015      Recent Labs Lab 09/06/15 1051  NA 141  K 4.8  CL 106  CO2 23  BUN 21  CREATININE 1.00*  CALCIUM 9.1  GLUCOSE 312*      Radiology/Studies: No results found.   11/03/14 Echocardiogram Study Conclusions - Left ventricle: The cavity size was normal. Systolic function was normal. The estimated ejection fraction was in the range of 55% to 60%. Wall motion was normal; there were no regional wall motion abnormalities. Doppler parameters are consistent with high ventricular filling pressure. - Mitral valve: Calcified annulus. Mildly thickened, mildly calcified leaflets anterior. -  Left atrium: The atrium was mildly dilated. - Right ventricle: The cavity size was mildly dilated. Wall thickness was normal. - Tricuspid valve: Mild regurgitation. - Pulmonary arteries: Systolic pressure was moderately increased. PA peak pressure: 18mm Hg (S).  EKG: 09/06/15 is reported by [facility] as AFib, rate 54, QTc 49ms, reviewed by Dr. Rayann Heman TELEMETRY: AFib 70's    Assessment and Plan:  1. PAFib, symptomatic     Out patient discussion with AFib clinic NP, Roderic Palau and [facility], patient is agreeable for Tikosyn     Re-discussed importance of medicine compliance, she has not missed any doses of her Eliquis, started Oct. 11, 2016 and reports not missing any doses     Mag is 1.6 we Glynn Yepes give IV mag today     Creat Cl is 95.78, Reggie Welge start 52mcg BID     EKGs post each dose as per protocol, daily labs  2. HTN 3. DM     Ask pharmacy to manage DM meds           Signed, Tommye Standard, PA-C 09/06/2015 4:05 PM   I have seen and examined this patient with Tommye Standard.  Agree with above, note added to reflect my findings.  On exam, irregular rhythm, no murmurs, lungs clear.  Patient admitted for tikosyn load.  Has severe fatigue when in AF.  Cardioversion x1 with return to AF shortly thereafter.  Verbon Giangregorio give Mg tonight then start with 500 mg Tikosyn.  Lorynn Moeser M. Talan Gildner  MD 09/06/2015 5:19 PM

## 2015-09-06 NOTE — Plan of Care (Signed)
Problem: Safety Goal: Ability to verbalize understanding of medication therapies will improve Outcome: Progressing Patient was instructed on how to use her call light which is on her abdomen and she is aware and was instructed on white board with my name and number to reach me for any concerns or needs and patient verbalized understanding, will continue to monitor

## 2015-09-06 NOTE — Progress Notes (Signed)
Patient ID: Doris Jefferson DOB: [birth year], 74 yo MRN: [id number]     HPI Doris Jefferson is a 74 yo Doris Jefferson of Dr. Tamala Julian who was referred today for Tikosyn initiation. She has a h/o PAF with onset 18 months ago and had short term treatment with amiodarone and then had cardioversion. Amiodarone was stopped by Dr. Tamala Julian due to feeling now that Doris Jefferson had sleep apnea under control, that she would be able to maintain SR off drug. Blood thinner was stopped several months after maintaining SR. Few weeks ago,on a Saturday, she was clipping some shrubbery and started feeling hot and sweaty. SheOn 10/8, Doris Jefferson  proceeded to the ER and was found to be in afib with RVR at 125 bpm. She thinks she may have been in afib for about a week. Cardizem was increased to 180 mg bid , Lotensin decreased to 10 mg a day. She returned 10/11 with afib at 113 bpm. Felt better. Initiated Eliquis 5 mg BID with a chadsvasc score of at least 4. She returned 11/2 ,with increased dyspnea with exertion, 5 lb weight gain and pedal edema. Lasix 20 mg started. Resting heart rate is high 90's. She had been anticoagulated x 3 weeks and cardioversion scheduled. She did cardiovert successfully, but unfortunately, had ERAF. Amiodarone and Tikosyn were discussed as options to control afib and Doris Jefferson opted for Tikosyn.  Doris Jefferson is here with her husband. We reviewed potential side effects of Tikosyn including QTc prolongation. She is aware of the importance of not missing doses and will call the office if she misses more than 2 doses in a row. Reviewed medication list. She currently takes paroxetine 20mg  daily, however she has been taking the same dose for years and her QTc has been stable <429msec, will not d/c or adjust dose at this time. She did start magnesium supplement on 11/15 to help ensure level>1.8 today.Per note on Mg level drawn on 11/14, Doris Jefferson was to start Mg 400mg  BID, however rx sent for 400mg  daily. Doris Jefferson reports that she  has been taking it BID. She has been compliant with Eliquis and not missed any doses within the past 30 days. She is aware to inform all of her other providers about Tikosyn and to call the office if there is ever a question about taking a new medication.   EKG reviewed by Dr. Rayann Heman. Atrial Fibrillation with vent rate of 54 bpm. QTc 436 msec.   Labs: K 4.8, Mg low at 1.6, SCr 1, Wt 241 lbs, CrCl = 55mL/min   Past Medical History  Diagnosis Date  . Heart murmur   . Diabetes ([place])   . Hyperlipidemia   . OSA (obstructive sleep apnea)     severe with AHI 31/hr  . HTN (hypertension)   . PAF (paroxysmal atrial fibrillation) ([place])   . Obesity (BMI 30-39.9) 03/04/2014  . Anemia    Current Outpatient Prescriptions on File Prior to Visit  Medication Sig Dispense Refill  . apixaban (ELIQUIS) 5 MG TABS tablet Take 1 tablet (5 mg total) by mouth 2 (two) times daily. 60 tablet 0  . benazepril (LOTENSIN) 20 MG tablet Take 20 mg by mouth daily. Doris Jefferson is taking 10 mg (half a tablet) daily    . diltiazem (CARDIZEM CD) 180 MG 24 hr capsule Take 1 capsule (180 mg total) by mouth 2 (two) times daily. 60 capsule 6  . furosemide (LASIX) 20 MG tablet Take 1 tablet (20 mg total) by mouth daily. 30 tablet 6  . glimepiride (  AMARYL) 4 MG tablet Take 4 mg by mouth daily with breakfast.    . Liraglutide (VICTOZA [place]) Inject 1.8 mg into the skin daily.     . magnesium oxide (MAGNESIUM-OXIDE) 400 (241.3 MG) MG tablet Take 1 tablet (400 mg total) by mouth daily. 60 tablet 3  . metFORMIN (GLUCOPHAGE) 1000 MG tablet Take 1,000 mg by mouth 2 (two) times daily with a meal.    . metoprolol succinate (TOPROL-XL) 25 MG 24 hr tablet Take 1 tablet (25 mg total) by mouth 2 (two) times daily. 60 tablet 3  . Omega-3 Fatty Acids (FISH OIL) 1000 MG CAPS Take 1 capsule by mouth daily.    [name] PARoxetine (PAXIL) 20 MG tablet Take 20 mg by mouth daily.    . rosuvastatin (CRESTOR) 20 MG tablet Take 20 mg by mouth daily.    [name] terazosin  (HYTRIN) 5 MG capsule Take 5 mg by mouth at bedtime.     No current facility-administered medications on file prior to visit.   Allergies  Allergen Reactions  . Minocin [Minocycline Hcl] Other (See Comments)    Throat swelling  . Tape Rash    Assessment/Plan:  1. Atrial fibrillation - QTc acceptable. Mg low at 1.6 even with Mg 400mg  BID supplementation since 11/15, needs to be 1.8 for Tikosyn start. Discussed with Doris Jefferson that this will require IV Mg in the hospital and her hospital course may extended by a day, she is ok with this. CrCl =73mL/min, anticipated starting dose of Tikosyn is 537mcg BID. Doris Jefferson aware to report to admitting for Tikosyn initiation.   Janise Gora E. Lillyahna Hemberger, PharmD [facility] [id number] [place], [place] [address] Phone: [phone]; Fax: [phone] 09/06/2015 11:47 AM

## 2015-09-06 NOTE — Progress Notes (Addendum)
Pharmacy Review for Dofetilide (Tikosyn) Initiation  Admit Complaint: 74 y.o. female admitted 09/06/2015 with atrial fibrillation to be initiated on dofetilide.   Assessment:  Patient Exclusion Criteria: If any screening criteria checked as "Yes", then  patient  should NOT receive dofetilide until criteria item is corrected. If "Yes" please indicate correction plan.  YES  NO Patient  Exclusion Criteria Correction Plan  []  [x]  Baseline QTc interval is greater than or equal to 440 msec. IF above YES box checked dofetilide contraindicated unless patient has ICD; then may proceed if QTc 500-550 msec or with known ventricular conduction abnormalities may proceed with QTc 550-600 msec. QTc =     []  [x]  Magnesium level is less than 1.8 mEq/l : Last magnesium:  Lab Results  Component Value Date   MG 2.2 09/06/2015       I  []  [x]  Potassium level is less than 4 mEq/l : Last potassium:  Lab Results  Component Value Date   K 4.8 09/06/2015         []  [x]  Patient is known or suspected to have a digoxin level greater than 2 ng/ml: No results found for: DIGOXIN    []  [x]  Creatinine clearance less than 20 ml/min (calculated using Cockcroft-Gault, actual body weight and serum creatinine): Estimated Creatinine Clearance: 58.7 mL/min (by C-G formula based on Cr of 1).    []  [x]  Patient has received drugs known to prolong the QT intervals within the last 48 hours (phenothiazines, tricyclics or tetracyclic antidepressants, erythromycin, H-1 antihistamines, cisapride, fluoroquinolones, azithromycin). Drugs not listed above may have an, as yet, undetected potential to prolong the QT interval, updated information on QT prolonging agents is available at this website:QT prolonging agents   []  [x]  Patient received a dose of hydrochlorothiazide ([place]) alone or in any combination including triamterene (Dyazide, Maxzide) in the last 48 hours.   []  [x]  Patient received a medication known to increase  dofetilide plasma concentrations prior to initial dofetilide dose:  . Trimethoprim (Primsol, Proloprim) in the last 36 hours . Verapamil (Calan, Verelan) in the last 36 hours or a sustained release dose in the last 72 hours . Megestrol (Megace) in the last 5 days  . Cimetidine (Tagamet) in the last 6 hours . Ketoconazole (Nizoral) in the last 24 hours . Itraconazole (Sporanox) in the last 48 hours  . Prochlorperazine (Compazine) in the last 36 hours    []  [x]  Patient is known to have a history of torsades de pointes; congenital or acquired long QT syndromes.   []  [x]  Patient has received a Class 1 antiarrhythmic with less than 2 half-lives since last dose. (Disopyramide, Quinidine, Procainamide, Lidocaine, Mexiletine, Flecainide, Propafenone)   []  [x]  Patient has received amiodarone therapy in the past 3 months or amiodarone level is greater than 0.3 ng/ml.    Patient has been appropriately anticoagulated with Eliquis.  Ordering provider was confirmed at [email or web address] if they are not listed on the [place] Prescribers list.  Goal of Therapy: Follow renal function, electrolytes, potential drug interactions, and dose adjustment. Provide education and 1 week supply at discharge.  Plan:  [x]   Physician selected initial dose within range recommended for patients level of renal function - will monitor for response.  []   Physician selected initial dose outside of range recommended for patients level of renal function - will discuss if the dose should be altered at this time.   Select One Calculated CrCl  Dose q12h  [x]  > 60 ml/min 500 mcg  []   40-60 ml/min 250 mcg  []  20-40 ml/min 125 mcg   2. Follow up QTc after the first 5 doses, renal function, electrolytes (K & Mg) daily x 3     days, dose adjustment, success of initiation and facilitate 1 week discharge supply as     clinically indicated.  3. Initiate Tikosyn education video (Call [phone] and ask for video # [phone]).  4.  Place Enrollment Form on the chart for discharge supply of dofetilide.   Doris Jefferson 7:38 PM 09/06/2015

## 2015-09-06 NOTE — Plan of Care (Signed)
Problem: Skin Integrity: Goal: Risk for impaired skin integrity will decrease Outcome: Progressing Patient's skin is intact for now but instructed on importance to move around and to gep up for increased circulation and blood flow. Patient is a diabetic and is at higher risk of skin breakdown, will continue to monitor

## 2015-09-06 NOTE — Plan of Care (Signed)
Problem: Education: Goal: Knowledge of [facility] Education information/materials will improve Outcome: Progressing Patient given written information regarding new medications and reviewed current medications to make sure she knows what she takes and why, will continue to instruct and educate on new medications, procedures and things that pertain to her care and monitor her understanding

## 2015-09-07 DIAGNOSIS — G4733 Obstructive sleep apnea (adult) (pediatric): Secondary | ICD-10-CM

## 2015-09-07 DIAGNOSIS — I10 Essential (primary) hypertension: Secondary | ICD-10-CM

## 2015-09-07 LAB — GLUCOSE, CAPILLARY
GLUCOSE-CAPILLARY: 183 mg/dL — AB (ref 65–99)
Glucose-Capillary: 168 mg/dL — ABNORMAL HIGH (ref 65–99)
Glucose-Capillary: 134 mg/dL — ABNORMAL HIGH (ref 65–99)
Glucose-Capillary: 172 mg/dL — ABNORMAL HIGH (ref 65–99)

## 2015-09-07 LAB — BASIC METABOLIC PANEL
ANION GAP: 10 (ref 5–15)
BUN: 16 mg/dL (ref 6–20)
CALCIUM: 9.1 mg/dL (ref 8.9–10.3)
CHLORIDE: 106 mmol/L (ref 101–111)
CO2: 24 mmol/L (ref 22–32)
CREATININE: 0.77 mg/dL (ref 0.44–1.00)
GFR calc Af Amer: >60 mL/min (ref >60)
GFR calc non Af Amer: >60 mL/min (ref >60)
GLUCOSE: 172 mg/dL — AB (ref 65–99)
POTASSIUM: 3.9 mmol/L (ref 3.5–5.1)
Sodium: 140 mmol/L (ref 135–145)

## 2015-09-07 LAB — MAGNESIUM: MAGNESIUM: 1.8 mg/dL (ref 1.7–2.4)

## 2015-09-07 NOTE — Plan of Care (Signed)
Problem: Activity: Goal: Risk for activity intolerance will decrease Outcome: Completed/Met Date Met:  09/07/15 Patient ambulates in room and takes breaks in recliner to watch tv

## 2015-09-07 NOTE — Progress Notes (Signed)
    SUBJECTIVE: The patient is doing well today.  At this time, she denies chest pain, shortness of breath, or any new concerns.  Admitted 09-06-15 for Tikosyn load  Last echo 10/2013 - EF 55-60%, no RWMA, PA pressure 53, mild TR, LA 41.   CURRENT MEDICATIONS: . apixaban  5 mg Oral BID  . benazepril  10 mg Oral Daily  . diltiazem  180 mg Oral BID  . dofetilide  500 mcg Oral BID  . furosemide  20 mg Oral Daily  . glimepiride  4 mg Oral Q breakfast  . insulin aspart  0-9 Units Subcutaneous TID WC  . Liraglutide  1.8 mg Subcutaneous Daily  . magnesium oxide  400 mg Oral Daily  . metFORMIN  1,000 mg Oral BID WC  . metoprolol succinate  25 mg Oral BID  . PARoxetine  20 mg Oral Daily  . rosuvastatin  20 mg Oral Daily  . sodium chloride  3 mL Intravenous Q12H  . terazosin  5 mg Oral QHS      OBJECTIVE: Physical Exam: Filed Vitals:   09/06/15 1617 09/06/15 2015 09/07/15 0000 09/07/15 0500  BP:  125/55  147/69  Pulse:  92  81  Temp:  99.8 F (37.7 C) 98.5 F (36.9 C) 99 F (37.2 C)  TempSrc:  Oral Oral Oral  Resp:  18  18  Height: 5\' 3"  (1.6 m)     Weight: 242 lb 8 oz (109.997 kg)   239 lb 9.6 oz (108.682 kg)  SpO2:  96%  94%    Intake/Output Summary (Last 24 hours) at 09/07/15 [id number] Last data filed at 09/06/15 1816  Gross per 24 hour  Intake    200 ml  Output      0 ml  Net    200 ml    Telemetry reveals atrial fibrillation with controlled ventricular response  GEN- The patient is well appearing, alert and oriented x 3 today.   Head- normocephalic, atraumatic Eyes-  Sclera clear, conjunctiva pink Ears- hearing intact Oropharynx- clear Neck- supple Lungs- Clear to ausculation bilaterally, normal work of breathing Heart- Irregular rate and rhythm, no murmurs, rubs or gallops GI- soft, NT, ND, + BS Extremities- no clubbing, cyanosis, or edema Skin- no rash or lesion Psych- euthymic mood, full affect Neuro- strength and sensation are intact  LABS: Basic  Metabolic Panel:  Recent Labs  09/06/15 1051 09/06/15 1847 09/07/15 0425  NA 141  --  140  K 4.8  --  3.9  CL 106  --  106  CO2 23  --  24  GLUCOSE 312*  --  172*  BUN 21  --  16  CREATININE 1.00*  --  0.77  CALCIUM 9.1  --  9.1  MG 1.6 2.2 1.8    ASSESSMENT AND PLAN:  1.  Persistent atrial fibrillation Admitted 09-06-15 for Tikosyn load BMET, Mg, QTc stable Continue Eliquis for CHADS2VASC of at least 4 If still in AF tomorrow, will need DCCV (NPO after midnight tonight, I will arrange cardioversion) Last echo 10/2013 - will update this admission  2.  Hypertension Stable No change required today  3.  OSA Compliance with CPAP encouraged  4.  Obesity Weight loss encouraged  Chanetta Marshall, NP 09/07/2015 8:02 AM  I have seen, examined the patient, and reviewed the above assessment and plan.  On exam, iRRR. Changes to above are made where necessary.    Co Sign: Thompson Grayer, MD 09/07/2015 10:33 AM

## 2015-09-07 NOTE — Care Management Note (Addendum)
Case Management Note  Patient Details  Name: Doris Jefferson MRN: [id number] Date of Birth: 1941-01-21  Subjective/Objective:  Pt admitted for Tikosyn Load.                   Action/Plan: Benefits check in process for co pay. CM will make pt aware of cost once completed. Once stable for d/c pt will need a Rx for 7 day supply no refills to be filled via [facility] will assist with this Rx. Pt will then need a Rx for 30 day supply with refills. Pt uses [facility] on [address] and the medication is available. No further needs from CM at this time.    Expected Discharge Date:                  Expected Discharge Plan:  Home/Self Care  In-House Referral:  NA  Discharge planning Services  CM Consult  Post Acute Care Choice:  NA Choice offered to:  NA  DME Arranged:  N/A DME Agency:  NA  HH Arranged:  NA HH Agency:  NA  Status of Service:  Completed, signed off  Medicare Important Message Given:    Date Medicare IM Given:    Medicare IM give by:    Date Additional Medicare IM Given:    Additional Medicare Important Message give by:     If discussed at [place] of Stay Meetings, dates discussed:    Additional Comments: Tikosyn: Copay for brand will be $67.42-generic $53.97- prior auth not required. Pt is aware of cost.   Bethena Roys, RN 09/07/2015, 10:15 AM

## 2015-09-07 NOTE — Progress Notes (Signed)
Inpatient Diabetes Program Recommendations  AACE/ADA: New Consensus Statement on Inpatient Glycemic Control (2015)  Target Ranges:  Prepandial:   less than 140 mg/dL      Peak postprandial:   less than 180 mg/dL (1-2 hours)      Critically ill patients:  140 - 180 mg/dL   Review of Glycemic Control  Results for ONEITA, ZEHNDER (MRN [id number]) as of 09/07/2015 10:03  Ref. Range 10/26/2010 06:56 12/19/2013 11:48 08/16/2015 10:27 09/06/2015 20:51 09/07/2015 07:30  Glucose-Capillary Latest Ref Range: 65-99 mg/dL 195 (H) 165 (H) 171 (H) 192 (H) 168 (H)    Diabetes history: Type 2 Outpatient Diabetes medications: Amaryl 4mg  with breakfast, Victoza 1.8mg , Metformin 1000mg  bid Current orders for Inpatient glycemic control: Amaryl 4mg  with breakfast, Victoza 1.8mg , Metformin 1000mg  bid, Novolog 0-9 units tid with meals  Inpatient Diabetes Program Recommendations: Patient will be NPO at midnight on 11/30 for procedure- please consider placing Amaryl on hold at that same time to avoid hypoglycemia on December 1st.   Gentry Fitz, RN, Edythe Clarity, [place], CDE Diabetes Coordinator Inpatient Diabetes Program  [phone] (Team Pager) [phone] ([place]) 09/07/2015 10:05 AM

## 2015-09-07 NOTE — Progress Notes (Signed)
UR Completed Gradie Butrick Graves-Bigelow, RN,BSN [phone]  

## 2015-09-08 ENCOUNTER — Inpatient Hospital Stay (HOSPITAL_COMMUNITY): Payer: Medicare Other

## 2015-09-08 DIAGNOSIS — I4891 Unspecified atrial fibrillation: Secondary | ICD-10-CM

## 2015-09-08 DIAGNOSIS — I4901 Ventricular fibrillation: Secondary | ICD-10-CM

## 2015-09-08 DIAGNOSIS — I472 Ventricular tachycardia: Secondary | ICD-10-CM

## 2015-09-08 DIAGNOSIS — Z5181 Encounter for therapeutic drug level monitoring: Secondary | ICD-10-CM

## 2015-09-08 DIAGNOSIS — Z79899 Other long term (current) drug therapy: Secondary | ICD-10-CM

## 2015-09-08 LAB — BASIC METABOLIC PANEL
ANION GAP: 7 (ref 5–15)
BUN: 16 mg/dL (ref 6–20)
CALCIUM: 9 mg/dL (ref 8.9–10.3)
CO2: 27 mmol/L (ref 22–32)
CREATININE: 0.76 mg/dL (ref 0.44–1.00)
Chloride: 107 mmol/L (ref 101–111)
GFR calc Af Amer: >60 mL/min (ref >60)
GFR calc non Af Amer: >60 mL/min (ref >60)
GLUCOSE: 126 mg/dL — AB (ref 65–99)
Potassium: 3.8 mmol/L (ref 3.5–5.1)
Sodium: 141 mmol/L (ref 135–145)

## 2015-09-08 LAB — GLUCOSE, CAPILLARY
GLUCOSE-CAPILLARY: 189 mg/dL — AB (ref 65–99)
Glucose-Capillary: 204 mg/dL — ABNORMAL HIGH (ref 65–99)
Glucose-Capillary: 248 mg/dL — ABNORMAL HIGH (ref 65–99)

## 2015-09-08 LAB — MAGNESIUM
MAGNESIUM: 2.6 mg/dL — AB (ref 1.7–2.4)
Magnesium: 1.5 mg/dL — ABNORMAL LOW (ref 1.7–2.4)

## 2015-09-08 LAB — MRSA PCR SCREENING: MRSA by PCR: NEGATIVE

## 2015-09-08 MED ORDER — OXYCODONE-ACETAMINOPHEN 5-325 MG PO TABS
1.0000 | ORAL_TABLET | ORAL | Status: DC | PRN
  Administered 2015-09-08 (×2): 1 via ORAL
  Administered 2015-09-09: 2 via ORAL
  Administered 2015-09-09 – 2015-09-10 (×2): 1 via ORAL
  Administered 2015-09-10 – 2015-09-12 (×3): 2 via ORAL
  Filled 2015-09-08 (×2): qty 2
  Filled 2015-09-08: qty 1
  Filled 2015-09-08: qty 2
  Filled 2015-09-08: qty 1
  Filled 2015-09-08: qty 2
  Filled 2015-09-08 (×2): qty 1

## 2015-09-08 MED ORDER — MAGNESIUM SULFATE 4 GM/100ML IV SOLN
4.0000 g | Freq: Once | INTRAVENOUS | Status: AC
  Administered 2015-09-08: 4 g via INTRAVENOUS
  Filled 2015-09-08: qty 100

## 2015-09-08 MED ORDER — PERFLUTREN LIPID MICROSPHERE
INTRAVENOUS | Status: AC
  Administered 2015-09-08: 2 mL
  Filled 2015-09-08: qty 10

## 2015-09-08 MED ORDER — OFF THE BEAT BOOK
Freq: Once | Status: DC
  Filled 2015-09-08: qty 1

## 2015-09-08 MED FILL — Medication: Qty: 1 | Status: AC

## 2015-09-08 NOTE — Progress Notes (Signed)
Pt. Arrived on unit from 3W after rhythm change from NSR to torsades and then into vfib. Upon arrival pt. Was on NRB with some shortness of breath but no complaints of pain or discomfort and IV mag infusing. Pt. Alert and oriented X4. Pt. Taken off of NRB and placed on [place] 3L and 02 sats remained stable. HR, NSR with no ectopy on monitor and BP stable upon arrival and remains stable at this time. Obtained a 12 lead upon arrival. Frequent vitals set up. Will continue to monitor closely.   BP 131/55 mmHg  Pulse 73  Temp(Src) 98.2 F (36.8 C) (Oral)  Resp 15  Ht 5\' 3"  (1.6 m)  Wt 107.276 kg (236 lb 8 oz)  BMI 41.90 kg/m2  SpO2 99%  Doretha Sou, RN

## 2015-09-08 NOTE — Progress Notes (Signed)
Notified by [facility] patient in [place].  When nursing staff went into room, found patient in bathroom unresponsive.  Patient assisted to floor, code blue called.  Patient resuscitated and transferred to [facility].  Sanda Linger

## 2015-09-08 NOTE — Progress Notes (Signed)
The patinet went into torsades, she was in the restroom at the time.  Staff responded, Dr. Ellyn Hack responded as well, the patient was helped to the floor (no fall or trauma), CPR was started, and defibrillated with 1 shock (total time 46minutes).  The patient is currently SR 80's, BP 177/80, awake and responding well.  She was not observed to have LOC, or loss of respirations.  Dr. Rayann Heman notified, further magnesium repletion with 2gm IV is underway already, she is being transferred to ICU. Stop Tikosyn, monitor closely.  EKG to be done upon arrival to ICU.  Tommye Standard, PA-C  Thompson Grayer MD, [facility]

## 2015-09-08 NOTE — Progress Notes (Signed)
Patient has hx of OSA, patient also has home CPAP. Placed on by RN. Tolerating well.

## 2015-09-08 NOTE — Progress Notes (Addendum)
   CODE NOTE   Was present on [address] & hear call for help. Pt found in bathroom - Torsades [place] on [place].    Pt was carefully moved to the floor -& CPR initiated x ~2 min (this was within ~1-2 min of onset of VT while Defib Pads placed. Once pads in place - appeared to have progressed to V Fib. -->  Shock x 1 150 J with restoration pulse - but HR Ventricular Escape rate ~30 --> 1 Amp epi given as she was unresponsive.   Brief review of history - Pt here for Tikosyn load -- concern for QT prolongation. (IV Mag just completed - 2 mg additional ordered - actually will run as IV drip as ROSC obtained with 1 shock).  Brief CPR continued until pulse became palpable following Epi. --> initial BP ~190/90 mmHg. Within ~1 minute, she became responsive with spontaneous respirations & followed commands. With adequate BP - Mag changed from push to IV drip.  EP PA arrived shortly after Defibrillation.  Pt transferred to stretcher & moved to CCU.    Husband was present during the event - I updated him on coarse of events & that she has been stabilized.  He went with her to CCU.   Total Critical Care Time ~30 min    Chanelle Hodsdon, Leonie Green, M.D., M.S. Interventional Cardiologist   Pager # [phone]

## 2015-09-08 NOTE — Progress Notes (Signed)
  Echocardiogram 2D Echocardiogram with definity  has been performed.  Doris Jefferson M 09/08/2015, 5:06 PM

## 2015-09-08 NOTE — Progress Notes (Signed)
Noted that patient's rhythm has converted to sinus rhythm with sinus arrhythmia on telemetry. Review of telemetry overnight does not reveal a specific event leading to rhythm conversion.  Patient received tikosyn 500 mcg last PM and a 12-lead EKG was done 2 - 3 hours after the dose was given for QTc monitoring purposes. At that time, the patient was noted to be in atrial fibrillation with a controlled rate per EKG.  12-lead EKG done this morning confirms rhythm conversion. Also of note: QTc is 510 ms (previously 425 ms). Patient was slated for direct-current cardioversion per Dr. Rayann Heman this AM. It appears this will no longer be warranted. However, given the increase in her QTc overnight, MD follow-up is needed. MD on-call for Dr. Rayann Heman likely will not be able to address this appropriately given time left before going off-call (current time is 06:43).  Noted here for MD follow-up on AM rounds. Will communicate to oncoming shift to ensure MD notification once rounding team is available this morning.  Continuing to monitor.

## 2015-09-08 NOTE — Progress Notes (Signed)
    SUBJECTIVE: The patient is doing well today, she can tell she has converted back into rhythm.  At this time, she denies chest pain, shortness of breath, or any new concerns.  [name] apixaban  5 mg Oral BID  . benazepril  10 mg Oral Daily  . diltiazem  180 mg Oral BID  . dofetilide  500 mcg Oral BID  . furosemide  20 mg Oral Daily  . glimepiride  4 mg Oral Q breakfast  . insulin aspart  0-9 Units Subcutaneous TID WC  . Liraglutide  1.8 mg Subcutaneous Daily  . magnesium oxide  400 mg Oral Daily  . metFORMIN  1,000 mg Oral BID WC  . metoprolol succinate  25 mg Oral BID  . [START ON 09/09/2015] off the beat book   Does not apply Once  . PARoxetine  20 mg Oral Daily  . rosuvastatin  20 mg Oral Daily  . sodium chloride  3 mL Intravenous Q12H  . terazosin  5 mg Oral QHS      OBJECTIVE: Physical Exam: Filed Vitals:   09/07/15 1034 09/07/15 1435 09/07/15 2045 09/08/15 0500  BP: 147/84 139/68 153/78 131/59  Pulse: 93 71 86 65  Temp:  97.4 F (36.3 C) 99.1 F (37.3 C) 97.5 F (36.4 C)  TempSrc:  Oral Oral Oral  Resp:  18 18 18   Height:      Weight:    236 lb 8 oz (107.276 kg)  SpO2:  99% 98% 96%    Intake/Output Summary (Last 24 hours) at 09/08/15 0647 Last data filed at 09/07/15 2300  Gross per 24 hour  Intake   1200 ml  Output      0 ml  Net   1200 ml    Telemetry reveals sinus rhythm  GEN- The patient is obese, well appearing, alert and oriented x 3 today.   Head- normocephalic, atraumatic Eyes-  Sclera clear, conjunctiva pink Ears- hearing intact Neck- supple, no JVP Lungs- Clear to ausculation bilaterally, normal work of breathing Heart- Regular rate and rhythm, no significant murmurs, no rubs or gallops GI- soft, NT, ND, + BS Extremities- no clubbing, cyanosis, or edema Skin- no rash or lesion Psych- euthymic mood, full affect Neuro- no gross deficits appreciated  LABS: Basic Metabolic Panel:  Recent Labs  09/07/15 0425 09/08/15 0331  NA 140 141  K  3.9 3.8  CL 106 107  CO2 24 27  GLUCOSE 172* 126*  BUN 16 16  CREATININE 0.77 0.76  CALCIUM 9.1 9.0  MG 1.8 1.5*    ASSESSMENT AND PLAN:  Active Problems:   Visit for monitoring Tikosyn therapy  1. Persistent atrial fibrillation Admitted 09-06-15 for Tikosyn load, 3 doses given so far K+ 3.8, Mag 1.5 will replace this morning, renal function stable EKG this morning is SR, QTc is reviewed with Dr. Rayann Heman, stable to continue load Continue Eliquis for CHADS2VASC of at least 4 Echo is pending  2. Hypertension Stable No change required today  3. OSA Compliance with CPAP encouraged  4. Obesity Weight loss encouraged  Tommye Standard, PA-C 09/08/2015 6:47 AM   I have seen, examined the patient, and reviewed the above assessment and plan.  On exam, comfortable.  RRR.  Changes to above are made where necessary.    Co Sign: Thompson Grayer, MD

## 2015-09-08 NOTE — Code Documentation (Signed)
CODE BLUE NOTE  Patient Name: Doris Jefferson   MRN: [id number]   Date of Birth/ Sex: Mar 20, 1941 , female      Admission Date: 09/06/2015  Attending Provider: Constance Haw, MD  Primary Diagnosis: <principal problem not specified>    Indication: Pt was in her usual state of health until this AM, when she went into Vfib. Code blue was subsequently called. At the time of arrival on scene, ACLS protocol was underway.    Technical Description:  - CPR performance duration:  2  minutes  - Was defibrillation or cardioversion used? Yes   - Was external pacer placed? Yes  - Was patient intubated pre/post CPR? No    Medications Administered: Y = Yes; Blank = No Amiodarone    Atropine    Calcium    Epinephrine  Y  Lidocaine    Magnesium    Norepinephrine    Phenylephrine    Sodium bicarbonate    Vasopressin      Post CPR evaluation:  - Final Status - Was patient successfully resuscitated ? Yes - What is current rhythm? Afib  - What is current hemodynamic status? Stable- BP 195/65   Miscellaneous Information:  - Labs sent, including: None  - Primary team notified?  Yes  - Family Notified? Yes  - Additional notes/ transfer status: Transfer to ICU        Sela Hua, MD  09/08/2015, 10:58 AM

## 2015-09-08 NOTE — Progress Notes (Signed)
I was on the floor when I heard a RN call for help.  Pt was found on the toilet/unresponsive and agonal respirations. Pt was immediately moved down to floor and ambu bagged on 100% fio2 w/ no apparent difficulty. Pt bagged on 100% fio2 t/o code blue.  Pt regained spontaneous respirations 14-20 bpm, assisted ventilations until 100% NRB mask could be placed on pt.  Pt was transferred to ICU on NRB w/ MD, RN and RT.

## 2015-09-08 NOTE — Plan of Care (Signed)
Problem: Health Behavior/Discharge Planning: Goal: Ability to safely manage health-related needs after discharge will improve Outcome: Progressing Atrial arrhythmia pathway initiated. Patient is tikosyn loading with corrected potassium and magnesium levels. QTc is within tolerance (last 425 ms). Atrial fibrillation continues, rate-controlled on telemetry. Patient is currently asymptomatic.  Latest vital signs: Filed Vitals:    09/07/15 1435 09/07/15 2045  BP: 139/68 153/78  Pulse: 71 86  Temp: 97.4 F (36.3 C) 99.1 F (37.3 C)  Resp: 18 18   No problems noted with current therapies.  Activity progressing. Patient ambulating in room as tolerated.  Educated patient re:  Plan of care for atrial arrhythmia  Medications  Dofetilide (Tikosyn)  Diltiazem (Cardizem CD)  Metoprolol succinate (Toprol XL)  Apixaban (Eliquis)  Activity progression  Patient has limited knowledge of health conditions and will need reinforcement of education leading-up to discharge to ensure safety and compliance with therapy at home. Case management following for other discharge needs.

## 2015-09-09 LAB — BASIC METABOLIC PANEL
Anion gap: 8 (ref 5–15)
BUN: 16 mg/dL (ref 6–20)
CO2: 27 mmol/L (ref 22–32)
CREATININE: 0.8 mg/dL (ref 0.44–1.00)
Calcium: 8.5 mg/dL — ABNORMAL LOW (ref 8.9–10.3)
Chloride: 104 mmol/L (ref 101–111)
GFR calc Af Amer: >60 mL/min (ref >60)
GLUCOSE: 133 mg/dL — AB (ref 65–99)
POTASSIUM: 3.6 mmol/L (ref 3.5–5.1)
SODIUM: 139 mmol/L (ref 135–145)

## 2015-09-09 LAB — GLUCOSE, CAPILLARY
GLUCOSE-CAPILLARY: 160 mg/dL — AB (ref 65–99)
GLUCOSE-CAPILLARY: 188 mg/dL — AB (ref 65–99)
GLUCOSE-CAPILLARY: 174 mg/dL — AB (ref 65–99)
Glucose-Capillary: 185 mg/dL — ABNORMAL HIGH (ref 65–99)
Glucose-Capillary: 170 mg/dL — ABNORMAL HIGH (ref 65–99)

## 2015-09-09 LAB — MAGNESIUM: MAGNESIUM: 2.1 mg/dL (ref 1.7–2.4)

## 2015-09-09 MED ORDER — METFORMIN HCL 500 MG PO TABS
1000.0000 mg | ORAL_TABLET | Freq: Two times a day (BID) | ORAL | Status: DC
Start: 2015-09-10 — End: 2015-09-09

## 2015-09-09 MED ORDER — ACETAMINOPHEN 325 MG PO TABS: 650.0000 mg | ORAL_TABLET | Freq: Four times a day (QID) | ORAL | Status: DC | PRN

## 2015-09-09 MED ORDER — GLIMEPIRIDE 4 MG PO TABS: 4.0000 mg | ORAL_TABLET | Freq: Every day | ORAL | Status: DC

## 2015-09-09 MED ORDER — POTASSIUM CHLORIDE CRYS ER 20 MEQ PO TBCR
40.0000 meq | EXTENDED_RELEASE_TABLET | Freq: Two times a day (BID) | ORAL | Status: AC
  Administered 2015-09-09 (×2): 40 meq via ORAL
  Filled 2015-09-09 (×2): qty 2

## 2015-09-09 NOTE — Progress Notes (Signed)
SUBJECTIVE: The patient is doing well today.  At this time, she denies chest pain, shortness of breath, or any new concerns.  CURRENT MEDICATIONS: . apixaban  5 mg Oral BID  . benazepril  10 mg Oral Daily  . diltiazem  180 mg Oral BID  . furosemide  20 mg Oral Daily  . glimepiride  4 mg Oral Q breakfast  . insulin aspart  0-9 Units Subcutaneous TID WC  . Liraglutide  1.8 mg Subcutaneous Daily  . magnesium oxide  400 mg Oral Daily  . metFORMIN  1,000 mg Oral BID WC  . metoprolol succinate  25 mg Oral BID  . off the beat book   Does not apply Once  . rosuvastatin  20 mg Oral Daily  . sodium chloride  3 mL Intravenous Q12H  . terazosin  5 mg Oral QHS      OBJECTIVE: Physical Exam: Filed Vitals:   09/09/15 0400 09/09/15 0500 09/09/15 0600 09/09/15 0700  BP: 102/46 100/49 121/52 121/53  Pulse: 56 59 64 62  Temp: 98.1 F (36.7 C)   98.7 F (37.1 C)  TempSrc: Oral   Oral  Resp: 16 16 20 21   Height:      Weight:      SpO2: 92% 90% 94% 98%    Intake/Output Summary (Last 24 hours) at 09/09/15 0751 Last data filed at 09/09/15 0600  Gross per 24 hour  Intake    233 ml  Output   1150 ml  Net   -917 ml    Telemetry reveals sinus rhythm, no further ventricular ectopy  GEN- The patient is obese, well appearing, alert and oriented x 3 today.   Head- normocephalic, atraumatic Eyes-  Sclera clear, conjunctiva pink Ears- hearing intact Neck- supple, no JVP Lungs- Clear to ausculation bilaterally, normal work of breathing Heart- Regular rate and rhythm  GI- soft, NT, ND, + BS Extremities- no clubbing, cyanosis, or edema Skin- no rash or lesion Psych- euthymic mood, full affect Neuro- no gross deficits appreciated  LABS: Basic Metabolic Panel:  Recent Labs  09/08/15 0331 09/08/15 1441 09/09/15 0240  NA 141  --  139  K 3.8  --  3.6  CL 107  --  104  CO2 27  --  27  GLUCOSE 126*  --  133*  BUN 16  --  16  CREATININE 0.76  --  0.80  CALCIUM 9.0  --  8.5*  MG  1.5* 2.6* 2.1    ASSESSMENT AND PLAN:  Active Problems:   Visit for monitoring Tikosyn therapy  1. Persistent atrial fibrillation Admitted 09-06-15 for Tikosyn load  Torsades yesterday requiring defibrillation QTc still significantly prolonged this morning Continue Eliquis for CHADS2VASC of at least 4 Will need to wait for QT to shorten to decide about retrying Tikosyn at lower dose or another AAD  2. Hypertension Stable No change required today  3. OSA Compliance with CPAP encouraged  4. Obesity Weight loss encouraged  5.  Hypokalemia Repleted this morning  Will transfer back to telemetry today.    Chanetta Marshall, NP 09/09/2015 7:53 AM  I have seen, examined the patient, and reviewed the above assessment and plan.  On exam, RRR. Comfortable. Changes to above are made where necessary.  Would continue to monitor until QTc is back to normal.  At that point, would restart tikosyn 250 mcg BID and follow closely.  This approach was discussed with patient who is willing to proceed.  Lifestyle modification discussed  at length with patient and her family.  Transfer back to telemetry later today if rhythm remains stable.  Co Sign: Thompson Grayer, MD 09/09/2015 10:32 AM

## 2015-09-09 NOTE — Care Management Important Message (Signed)
Important Message  Patient Details  Name: Doris Jefferson MRN: [id number] Date of Birth: July 21, 1941   Medicare Important Message Given:  Yes    Indianna Boran P Tomeka Kantner 09/09/2015, 1:16 PM

## 2015-09-09 NOTE — Procedures (Signed)
Pt has home CPAP and will place on self when ready.  Pt notified to call RT is assistance is needed.

## 2015-09-10 ENCOUNTER — Inpatient Hospital Stay (HOSPITAL_COMMUNITY): Payer: Medicare Other

## 2015-09-10 LAB — BASIC METABOLIC PANEL
Anion gap: 7 (ref 5–15)
BUN: 15 mg/dL (ref 6–20)
CALCIUM: 9.1 mg/dL (ref 8.9–10.3)
CO2: 25 mmol/L (ref 22–32)
CREATININE: 0.87 mg/dL (ref 0.44–1.00)
Chloride: 105 mmol/L (ref 101–111)
GFR calc non Af Amer: >60 mL/min (ref >60)
GLUCOSE: 185 mg/dL — AB (ref 65–99)
Potassium: 4.6 mmol/L (ref 3.5–5.1)
Sodium: 137 mmol/L (ref 135–145)

## 2015-09-10 LAB — GLUCOSE, CAPILLARY
Glucose-Capillary: 171 mg/dL — ABNORMAL HIGH (ref 65–99)
Glucose-Capillary: 195 mg/dL — ABNORMAL HIGH (ref 65–99)
Glucose-Capillary: 122 mg/dL — ABNORMAL HIGH (ref 65–99)
Glucose-Capillary: 137 mg/dL — ABNORMAL HIGH (ref 65–99)

## 2015-09-10 NOTE — Progress Notes (Signed)
SUBJECTIVE: The patient is doing well again today.  At this time, she denies chest pain, shortness of breath, or any new concerns.  CURRENT MEDICATIONS: . apixaban  5 mg Oral BID  . benazepril  10 mg Oral Daily  . diltiazem  180 mg Oral BID  . furosemide  20 mg Oral Daily  . glimepiride  4 mg Oral Q breakfast  . insulin aspart  0-9 Units Subcutaneous TID WC  . Liraglutide  1.8 mg Subcutaneous Daily  . magnesium oxide  400 mg Oral Daily  . metFORMIN  1,000 mg Oral BID WC  . metoprolol succinate  25 mg Oral BID  . off the beat book   Does not apply Once  . rosuvastatin  20 mg Oral Daily  . sodium chloride  3 mL Intravenous Q12H  . terazosin  5 mg Oral QHS      OBJECTIVE: Physical Exam: Filed Vitals:   09/09/15 1300 09/09/15 1624 09/09/15 2100 09/10/15 0500  BP: 123/49 109/50 130/52 107/57  Pulse: 63 66 71 69  Temp:  98.6 F (37 C) 98.2 F (36.8 C) 98 F (36.7 C)  TempSrc:  Oral Oral Oral  Resp: 22  20 18   Height:  5\' 3"  (1.6 m)    Weight:  237 lb 8 oz (107.729 kg)  238 lb (107.956 kg)  SpO2: 96% 95% 98% 92%    Intake/Output Summary (Last 24 hours) at 09/10/15 1020 Last data filed at 09/09/15 2230  Gross per 24 hour  Intake    715 ml  Output      0 ml  Net    715 ml    Telemetry remains sinus rhythm, no further ventricular ectopy or arrhythmias  GEN- The patient is obese, well appearing, alert and oriented x 3 today.   Head- normocephalic, atraumatic Eyes-  Sclera clear, conjunctiva pink Ears- hearing intact Neck- supple, no JVP Lungs- Clear to ausculation bilaterally, normal work of breathing Heart- Regular rate and rhythm  GI- soft, NT, ND, + BS Extremities- no clubbing, cyanosis, or edema Skin- no rash or lesion Psych- euthymic mood, full affect Neuro- no gross deficits appreciated  LABS: Basic Metabolic Panel:  Recent Labs  09/08/15 0331 09/08/15 1441 09/09/15 0240  NA 141  --  139  K 3.8  --  3.6  CL 107  --  104  CO2 27  --  27    GLUCOSE 126*  --  133*  BUN 16  --  16  CREATININE 0.76  --  0.80  CALCIUM 9.0  --  8.5*  MG 1.5* 2.6* 2.1    ASSESSMENT AND PLAN:  Active Problems:   Visit for monitoring Tikosyn therapy  1. Persistent atrial fibrillation Admitted 09-06-15 for Tikosyn load  Torsades 09/08/15 requiring defibrillation QTc still significantly prolonged this morning, recheck this afternoon Continue Eliquis for CHADS2VASC of at least 4 [place] need to wait for QT to shorten to decide about retrying Tikosyn at lower dose or another AAD  2. Hypertension Stable No change required today  3. OSA Compliance with CPAP encouraged  4. Obesity Weight loss encouraged  5.  Hypokalemia Check BMET today  Tommye Standard, Hershal Coria  09/10/2015 10:20 AM  I have seen and examined this patient with Tommye Standard.  Agree with above, note added to reflect my findings.  On exam, regular rhythm, no murmurs, lungs clear.  Had QTc prolongation and torsades.  Tikosyn stopped.  QTc remains prolonged.  Kimmberly Wisser get ECG in the  afternoon and possibly start tikosyn at 250 mg and follow closely for QTc changes.    Cayce Quezada M. Fabiola Mudgett MD 09/10/2015 4:14 PM

## 2015-09-10 NOTE — Procedures (Signed)
Pt has home CPAP and will place on self when ready for the night.

## 2015-09-10 NOTE — Evaluation (Signed)
Physical Therapy Evaluation Patient Details Name: Doris Jefferson MRN: [id number] DOB: 1941-07-30 Today's Date: 09/10/2015   History of Present Illness  Patient is a 74 yo female admitted 09/06/15 with symptomatic PAF for Tikosyn therapy.  Patient with Vfib and code on 09/08/15 to ICU.    PMH:  HTN, OSA on CPAP, obesity, PAF, DM  Clinical Impression  Patient presents with problems listed below.  Will benefit from acute PT to maximize functional mobility and safety prior to discharge home with husband.  Recommend HHPT at discharge to continue therapy for balance deficits.    Follow Up Recommendations Home health PT;Supervision for mobility/OOB (For balance)    Equipment Recommendations  None recommended by PT    Recommendations for Other Services       Precautions / Restrictions Precautions Precautions: Fall Restrictions Weight Bearing Restrictions: No      Mobility  Bed Mobility               General bed mobility comments: Patient in chair  Transfers Overall transfer level: Needs assistance Equipment used: Rolling walker (2 wheeled) Transfers: Sit to/from Stand Sit to Stand: Min assist         General transfer comment: Verbal cues for hand placement.  Assist to power up to standing and for balance.  Ambulation/Gait Ambulation/Gait assistance: Min assist Ambulation Distance (Feet): 84 Feet Assistive device: Rolling walker (2 wheeled) Gait Pattern/deviations: Step-through pattern;Decreased stride length;Shuffle Gait velocity: very slow Gait velocity interpretation: Below normal speed for age/gender General Gait Details: Attempted ambulation with no assistive device.  Patient with poor balance.  Verbal cues for use of RW.  Patient with slow, guarded gait with RW - balance improved.  HR 77 at rest and increased to 95 with gait.  Stairs            Wheelchair Mobility    Modified Rankin (Stroke Patients Only)       Balance Overall balance assessment:  Needs assistance         Standing balance support: Bilateral upper extremity supported Standing balance-Leahy Scale: Poor                               Pertinent Vitals/Pain Pain Assessment: 0-10 Pain Score: 3  Pain Location: Rt chest wall (from CPR) Pain Descriptors / Indicators: Aching;Sore Pain Intervention(s): Monitored during session    Home Living Family/patient expects to be discharged to:: Private residence Living Arrangements: Spouse/significant other Available Help at Discharge: Family;Available 24 hours/day (Daughter from [place] coming to help for week) Type of Home: House Home Access: Stairs to enter Entrance Stairs-Rails: None Entrance Stairs-Number of Steps: 1 Home Layout: One level (2 steps at [employer]) Home Equipment: Gilford Rile - 2 wheels;Bedside commode;Shower seat      Prior Function Level of Independence: Independent         Comments: Drives     Hand Dominance        Extremity/Trunk Assessment   Upper Extremity Assessment: Generalized weakness           Lower Extremity Assessment: Generalized weakness (Patient reports she "twisted" Lt knee)         Communication   Communication: No difficulties  Cognition Arousal/Alertness: Awake/alert Behavior During Therapy: WFL for tasks assessed/performed Overall Cognitive Status: Within Functional Limits for tasks assessed                      General Comments  Exercises        Assessment/Plan    PT Assessment Patient needs continued PT services  PT Diagnosis Difficulty walking;Abnormality of gait;Generalized weakness;Acute pain   PT Problem List Decreased strength;Decreased activity tolerance;Decreased balance;Decreased mobility;Decreased knowledge of use of DME;Cardiopulmonary status limiting activity;Pain  PT Treatment Interventions DME instruction;Gait training;Functional mobility training;Therapeutic activities;Therapeutic exercise;Patient/family education    PT Goals (Current goals can be found in the Care Plan section) Acute Rehab PT Goals Patient Stated Goal: None stated PT Goal Formulation: With patient Time For Goal Achievement: 09/17/15 Potential to Achieve Goals: Good    Frequency Min 3X/week   Barriers to discharge        Co-evaluation               End of Session Equipment Utilized During Treatment: Gait belt Activity Tolerance: Patient tolerated treatment well Patient left: in chair (in w/c - being transported to radiology) Nurse Communication: Mobility status         Time: [id number] PT Time Calculation (min) (ACUTE ONLY): 11 min   Charges:   PT Evaluation $Initial PT Evaluation Tier I: 1 Procedure     PT G CodesDespina Pole 2015/09/15, 6:45 PM Carita Pian. Sanjuana Kava, Page Pager [phone]

## 2015-09-10 NOTE — Progress Notes (Signed)
Patient has converted back to AFib, rate's 80's-90's, EKG is reviewed, QTc still prolonged, unable to retry Tikosyn at this time.  Rate is controlled, BP stable and she is asymptomatic. EKG in the morning Pt c/o left knee pain, no acute findings on xray.  D/w Dr. Gardenia Phlegm, PA-C  No changes necessary, continue to hold tikosyn  Allegra Lai, MD

## 2015-09-11 LAB — GLUCOSE, CAPILLARY
GLUCOSE-CAPILLARY: 166 mg/dL — AB (ref 65–99)
GLUCOSE-CAPILLARY: 161 mg/dL — AB (ref 65–99)
GLUCOSE-CAPILLARY: 205 mg/dL — AB (ref 65–99)
GLUCOSE-CAPILLARY: 109 mg/dL — AB (ref 65–99)

## 2015-09-11 MED ORDER — AMIODARONE HCL 200 MG PO TABS
200.0000 mg | ORAL_TABLET | Freq: Three times a day (TID) | ORAL | Status: DC
  Administered 2015-09-11 – 2015-09-13 (×8): 200 mg via ORAL
  Filled 2015-09-11 (×8): qty 1

## 2015-09-11 MED ORDER — POLYETHYLENE GLYCOL 3350 17 G PO PACK
17.0000 g | PACK | Freq: Every day | ORAL | Status: DC | PRN
  Administered 2015-09-11: 17 g via ORAL
  Filled 2015-09-11: qty 1

## 2015-09-11 NOTE — Progress Notes (Signed)
SUBJECTIVE: The patient is doing well today.  At this time, she denies chest pain, shortness of breath, or any new concerns.  She has returned to afib but is unaware.  CURRENT MEDICATIONS: . amiodarone  200 mg Oral TID  . apixaban  5 mg Oral BID  . benazepril  10 mg Oral Daily  . diltiazem  180 mg Oral BID  . furosemide  20 mg Oral Daily  . glimepiride  4 mg Oral Q breakfast  . insulin aspart  0-9 Units Subcutaneous TID WC  . Liraglutide  1.8 mg Subcutaneous Daily  . magnesium oxide  400 mg Oral Daily  . metFORMIN  1,000 mg Oral BID WC  . metoprolol succinate  25 mg Oral BID  . off the beat book   Does not apply Once  . rosuvastatin  20 mg Oral Daily  . sodium chloride  3 mL Intravenous Q12H  . terazosin  5 mg Oral QHS      OBJECTIVE: Physical Exam: Filed Vitals:   09/10/15 1103 09/10/15 1423 09/10/15 2047 09/11/15 0453  BP:  136/62 134/56 110/62  Pulse: 79 90 96 96  Temp:  98.3 F (36.8 C) 99.3 F (37.4 C) 98.7 F (37.1 C)  TempSrc:  Oral Oral Oral  Resp:  16 18 19   Height:      Weight:    234 lb (106.142 kg)  SpO2:  98% 96% 98%    Intake/Output Summary (Last 24 hours) at 09/11/15 [id number] Last data filed at 09/11/15 0503  Gross per 24 hour  Intake    270 ml  Output   1600 ml  Net  -1330 ml    Telemetry reveals rate controlled afib  GEN- The patient is obese, well appearing, alert and oriented x 3 today.   Head- normocephalic, atraumatic Eyes-  Sclera clear, conjunctiva pink Ears- hearing intact Neck- supple, no JVP Lungs- Clear to ausculation bilaterally, normal work of breathing Heart- irregular rate and rhythm  GI- soft, NT, ND, + BS Extremities- no clubbing, cyanosis, or edema Skin- no rash or lesion Psych- euthymic mood, full affect Neuro- no gross deficits appreciated  LABS: Basic Metabolic Panel:  Recent Labs  09/08/15 1441 09/09/15 0240 09/10/15 1132  NA  --  139 137  K  --  3.6 4.6  CL  --  104 105  CO2  --  27 25  GLUCOSE  --   133* 185*  BUN  --  16 15  CREATININE  --  0.80 0.87  CALCIUM  --  8.5* 9.1  MG 2.6* 2.1  --    ekg today reveals Qtc 500 msec  ASSESSMENT AND PLAN:  Active Problems:   Visit for monitoring Tikosyn therapy  1. Persistent atrial fibrillation Admitted 09-06-15 for Tikosyn load.  Had torsades with tikosyn and QT remains prolonged several days afterwards.  I think that she is a poor candidate for amiodarone going forward.  Ultimately, I suspect that rate control may serve her best. Rate and rhythm control were discussed at length with patient and spouse today.  At this time, she would prefer to continue a rhythm control approach. I will therefore start amiodarone 200mg  TID today If she does not convert to sinus rhythm, will require cardioversion early next week Check lfts, recent tfts were normal Continue Eliquis for CHADS2VASC of 4  2. Hypertension Stable No change required today  3. OSA Compliance with CPAP encouraged  4. Obesity Weight loss encouraged  Thompson Grayer, MD  09/11/2015 9:25 AM

## 2015-09-12 ENCOUNTER — Encounter (HOSPITAL_COMMUNITY): Payer: Self-pay

## 2015-09-12 LAB — BASIC METABOLIC PANEL
ANION GAP: 9 (ref 5–15)
BUN: 17 mg/dL (ref 6–20)
CALCIUM: 9.1 mg/dL (ref 8.9–10.3)
CHLORIDE: 104 mmol/L (ref 101–111)
CO2: 27 mmol/L (ref 22–32)
Creatinine, Ser: 0.91 mg/dL (ref 0.44–1.00)
GFR calc Af Amer: >60 mL/min (ref >60)
GFR calc non Af Amer: >60 mL/min (ref >60)
GLUCOSE: 151 mg/dL — AB (ref 65–99)
Potassium: 4.4 mmol/L (ref 3.5–5.1)
Sodium: 140 mmol/L (ref 135–145)

## 2015-09-12 LAB — GLUCOSE, CAPILLARY
GLUCOSE-CAPILLARY: 139 mg/dL — AB (ref 65–99)
GLUCOSE-CAPILLARY: 153 mg/dL — AB (ref 65–99)
GLUCOSE-CAPILLARY: 148 mg/dL — AB (ref 65–99)
GLUCOSE-CAPILLARY: 163 mg/dL — AB (ref 65–99)

## 2015-09-12 LAB — HEPATIC FUNCTION PANEL
ALBUMIN: 3.1 g/dL — AB (ref 3.5–5.0)
ALK PHOS: 60 U/L (ref 38–126)
ALT: 28 U/L (ref 14–54)
AST: 21 U/L (ref 15–41)
BILIRUBIN TOTAL: 0.6 mg/dL (ref 0.3–1.2)
Total Protein: 5.7 g/dL — ABNORMAL LOW (ref 6.5–8.1)

## 2015-09-12 LAB — MAGNESIUM: Magnesium: 1.8 mg/dL (ref 1.7–2.4)

## 2015-09-12 NOTE — Progress Notes (Signed)
Pt has her home CPAP setup at bedside and declines RT help.

## 2015-09-12 NOTE — Progress Notes (Signed)
    SUBJECTIVE: The patient is doing well today.  At this time, she denies chest pain, shortness of breath, or any new concerns.  Remains in afib.  Tolerating amiodarone.  CURRENT MEDICATIONS: . amiodarone  200 mg Oral TID  . apixaban  5 mg Oral BID  . benazepril  10 mg Oral Daily  . diltiazem  180 mg Oral BID  . furosemide  20 mg Oral Daily  . glimepiride  4 mg Oral Q breakfast  . insulin aspart  0-9 Units Subcutaneous TID WC  . Liraglutide  1.8 mg Subcutaneous Daily  . magnesium oxide  400 mg Oral Daily  . metFORMIN  1,000 mg Oral BID WC  . metoprolol succinate  25 mg Oral BID  . off the beat book   Does not apply Once  . rosuvastatin  20 mg Oral Daily  . sodium chloride  3 mL Intravenous Q12H  . terazosin  5 mg Oral QHS      OBJECTIVE: Physical Exam: Filed Vitals:   09/11/15 1311 09/11/15 2026 09/11/15 2200 09/12/15 0612  BP: 108/53 124/69 127/60 121/67  Pulse: 86 92 87 92  Temp: 97.8 F (36.6 C) 99.1 F (37.3 C)  97.4 F (36.3 C)  TempSrc: Oral Oral  Oral  Resp: 16 16  18   Height:      Weight:    234 lb 6.4 oz (106.323 kg)  SpO2: 97% 98%  98%    Intake/Output Summary (Last 24 hours) at 09/12/15 1054 Last data filed at 09/12/15 [id number]  Gross per 24 hour  Intake    720 ml  Output   2100 ml  Net  -1380 ml    Telemetry reveals rate controlled afib  GEN- The patient is obese, well appearing, alert and oriented x 3 today.   Head- normocephalic, atraumatic Eyes-  Sclera clear, conjunctiva pink Ears- hearing intact Neck- supple, no JVP Lungs- Clear to ausculation bilaterally, normal work of breathing Heart- irregular rate and rhythm  GI- soft, NT, ND, + BS Extremities- no clubbing, cyanosis, or edema Skin- no rash or lesion Psych- euthymic mood, full affect Neuro- no gross deficits appreciated  LABS: Basic Metabolic Panel:  Recent Labs  09/10/15 1132 09/12/15 0432  NA 137 140  K 4.6 4.4  CL 105 104  CO2 25 27  GLUCOSE 185* 151*  BUN 15 17    CREATININE 0.87 0.91  CALCIUM 9.1 9.1  MG  --  1.8   ekg today reviewed  ASSESSMENT AND PLAN:  Active Problems:   Visit for monitoring Tikosyn therapy  1. Persistent atrial fibrillation Admitted 09-06-15 for Tikosyn load.  Had torsades with tikosyn and QT remains prolonged several days afterwards.  I think that she is a poor candidate for tikosyn going forward.  Ultimately, I suspect that rate control may serve her best. Rate and rhythm control were discussed at length with patient and spouse today.  At this time, she would prefer to continue a rhythm control approach. Continue amiodarone Continue Eliquis for CHADS2VASC of 4 Will proceed with cardioversion tomorrow if still in afib  2. Hypertension Stable No change required today  3. OSA Compliance with CPAP encouraged  4. Obesity Weight loss encouraged  Thompson Grayer, MD 09/12/2015 10:54 AM

## 2015-09-12 NOTE — Care Management Important Message (Signed)
Important Message  Patient Details  Name: Doris Jefferson MRN: [id number] Date of Birth: [birth year]   Medicare Important Message Given:  Yes    Cayde Held Abena 09/12/2015, 11:09 AM

## 2015-09-13 ENCOUNTER — Encounter (HOSPITAL_COMMUNITY): Payer: Self-pay | Admitting: *Deleted

## 2015-09-13 ENCOUNTER — Inpatient Hospital Stay (HOSPITAL_COMMUNITY): Payer: Medicare Other | Admitting: Certified Registered Nurse Anesthetist

## 2015-09-13 ENCOUNTER — Encounter (HOSPITAL_COMMUNITY)
Admission: AD | Disposition: A | Discharge status: Home / Self Care | Payer: Self-pay | Source: Ambulatory Visit | Attending: Cardiology

## 2015-09-13 ENCOUNTER — Other Ambulatory Visit: Payer: Self-pay | Admitting: Physician Assistant

## 2015-09-13 DIAGNOSIS — I480 Paroxysmal atrial fibrillation: Secondary | ICD-10-CM

## 2015-09-13 HISTORY — PX: CARDIOVERSION: SHX1299

## 2015-09-13 LAB — MAGNESIUM: Magnesium: 1.6 mg/dL — ABNORMAL LOW (ref 1.7–2.4)

## 2015-09-13 LAB — GLUCOSE, CAPILLARY
Glucose-Capillary: 141 mg/dL — ABNORMAL HIGH (ref 65–99)
Glucose-Capillary: 111 mg/dL — ABNORMAL HIGH (ref 65–99)
Glucose-Capillary: 144 mg/dL — ABNORMAL HIGH (ref 65–99)

## 2015-09-13 LAB — BASIC METABOLIC PANEL
Anion gap: 10 (ref 5–15)
BUN: 18 mg/dL (ref 6–20)
CHLORIDE: 103 mmol/L (ref 101–111)
CO2: 26 mmol/L (ref 22–32)
Calcium: 9 mg/dL (ref 8.9–10.3)
Creatinine, Ser: 0.85 mg/dL (ref 0.44–1.00)
GFR calc Af Amer: >60 mL/min (ref >60)
GFR calc non Af Amer: >60 mL/min (ref >60)
GLUCOSE: 140 mg/dL — AB (ref 65–99)
POTASSIUM: 4.1 mmol/L (ref 3.5–5.1)
Sodium: 139 mmol/L (ref 135–145)

## 2015-09-13 SURGERY — CARDIOVERSION
Anesthesia: General

## 2015-09-13 MED ORDER — SODIUM CHLORIDE 0.9 % IV SOLN
INTRAVENOUS | Status: DC | PRN
  Administered 2015-09-13: 11:00:00 via INTRAVENOUS

## 2015-09-13 MED ORDER — MAGNESIUM SULFATE 50 % IJ SOLN
3.0000 g | Freq: Once | INTRAVENOUS | Status: AC
  Administered 2015-09-13: 3 g via INTRAVENOUS
  Filled 2015-09-13: qty 6

## 2015-09-13 MED ORDER — PROPOFOL 10 MG/ML IV BOLUS
INTRAVENOUS | Status: DC | PRN
  Administered 2015-09-13: 80 mg via INTRAVENOUS

## 2015-09-13 MED ORDER — FUROSEMIDE 20 MG PO TABS: 20.0000 mg | ORAL_TABLET | Freq: Every day | ORAL | Status: DC

## 2015-09-13 MED ORDER — LIDOCAINE HCL (CARDIAC) 20 MG/ML IV SOLN
INTRAVENOUS | Status: DC | PRN
  Administered 2015-09-13: 60 mg via INTRAVENOUS

## 2015-09-13 MED ORDER — AMIODARONE HCL 200 MG PO TABS: 200.0000 mg | ORAL_TABLET | Freq: Three times a day (TID) | ORAL | Status: DC

## 2015-09-13 MED ORDER — MAGNESIUM OXIDE 400 (241.3 MG) MG PO TABS: 400.0000 mg | ORAL_TABLET | Freq: Two times a day (BID) | ORAL | Status: DC

## 2015-09-13 NOTE — Transfer of Care (Addendum)
Immediate Anesthesia Transfer of Care Note  Patient: Doris Jefferson  Procedure(s) Performed: Procedure(s): CARDIOVERSION (N/A)  Patient Location: Endoscopy Unit  Anesthesia Type:MAC  Level of Consciousness: awake, alert , oriented and patient cooperative  Airway & Oxygen Therapy: Patient Spontanous Breathing  Post-op Assessment: Report given to RN and Post -op Vital signs reviewed and stable  Post vital signs: Reviewed and stable  Last Vitals:  Filed Vitals:   09/13/15 0933 09/13/15 0953  BP: 133/61 143/54  Pulse:  85  Temp:  36.5 C  Resp:  13    Complications: No apparent anesthesia complications

## 2015-09-13 NOTE — Interval H&P Note (Signed)
History and Physical Interval Note:  09/13/2015 10:11 AM  Doris Jefferson  has presented today for surgery, with the diagnosis of a fib  The various methods of treatment have been discussed with the patient and family. After consideration of risks, benefits and other options for treatment, the patient has consented to  Procedure(s): CARDIOVERSION (N/A) as a surgical intervention .  The patient's history has been reviewed, patient examined, no change in status, stable for surgery.  I have reviewed the patient's chart and labs.  Questions were answered to the patient's satisfaction.     Charde Macfarlane R

## 2015-09-13 NOTE — Discharge Instructions (Signed)

## 2015-09-13 NOTE — Progress Notes (Signed)
Physical Therapy Treatment Patient Details Name: Doris Jefferson MRN: [id number] DOB: [birth year] Today's Date: 09/13/2015    History of Present Illness Patient is a 74 yo female admitted 09/06/15 with symptomatic PAF for Tikosyn therapy.  Patient with Vfib and code on 09/08/15 to ICU.  Patient s/p cardioversion 09/13/15.  PMH:  HTN, OSA on CPAP, obesity, PAF, DM    PT Comments    Patient able to ambulate 11' with no assistive device or physical assist - supervision only for safety.  Balance good during gait.  Patient has achieved all PT goals.  No f/u PT needed at d/c. Patient has 24/7 supervision - husband and daughter.  Patient ready for d/c from PT perspective.   Follow Up Recommendations  No PT follow up;Supervision for mobility/OOB     Equipment Recommendations  None recommended by PT    Recommendations for Other Services       Precautions / Restrictions Precautions Precautions: None Restrictions Weight Bearing Restrictions: No    Mobility  Bed Mobility               General bed mobility comments: Patient in chair  Transfers Overall transfer level: Modified independent Equipment used: None Transfers: Sit to/from Stand Sit to Stand: Modified independent (Device/Increase time)         General transfer comment: No physical assist needed.  Increased time.  Ambulation/Gait Ambulation/Gait assistance: Supervision Ambulation Distance (Feet): 130 Feet Assistive device: None Gait Pattern/deviations: Step-through pattern;Decreased stride length Gait velocity: decreased Gait velocity interpretation: Below normal speed for age/gender General Gait Details: Patient with slow, guarded gait.  Good balance.  No physical assist needed.  Supervision for safety only.   HR 69 in chair, and 79 with gait.  No DOE.   Stairs            Wheelchair Mobility    Modified Rankin (Stroke Patients Only)       Balance           Standing balance support: No upper  extremity supported Standing balance-Leahy Scale: Good                      Cognition Arousal/Alertness: Awake/alert Behavior During Therapy: WFL for tasks assessed/performed Overall Cognitive Status: Within Functional Limits for tasks assessed                      Exercises      General Comments        Pertinent Vitals/Pain Pain Assessment: No/denies pain    Home Living                      Prior Function            PT Goals (current goals can now be found in the care plan section) Progress towards PT goals: Goals met/education completed, patient discharged from PT    Frequency  Min 3X/week    PT Plan Discharge plan needs to be updated    Co-evaluation             End of Session   Activity Tolerance: Patient tolerated treatment well Patient left: in chair;with call bell/phone within reach;with family/visitor present     Time: 1159-1209 PT Time Calculation (min) (ACUTE ONLY): 10 min  Charges:  $Gait Training: 8-22 mins                    G Codes:  Despina Pole 09/13/2015, 12:16 PM Carita Pian. Sanjuana Kava, [place] Pager [phone]

## 2015-09-13 NOTE — Anesthesia Preprocedure Evaluation (Addendum)
Anesthesia Evaluation  Patient identified by MRN, date of birth, ID band Patient awake    Reviewed: Allergy & Precautions, NPO status , Patient's Chart, lab work & pertinent test results  History of Anesthesia Complications Negative for: history of anesthetic complications  Airway Mallampati: III  TM Distance: >3 FB     Dental  (+) Dental Advisory Given   Pulmonary sleep apnea ,    breath sounds clear to auscultation       Cardiovascular hypertension, Pt. on medications and Pt. on home beta blockers + dysrhythmias Atrial Fibrillation + Valvular Problems/Murmurs AS  Rhythm:Irregular Rate:Normal  NL EF. Mild AS. Mean gradient of 56mmHg   Neuro/Psych negative neurological ROS     GI/Hepatic negative GI ROS, Neg liver ROS,   Endo/Other  diabetes, Type 2, Insulin DependentMorbid obesity  Renal/GU negative Renal ROS     Musculoskeletal   Abdominal   Peds  Hematology  (+) anemia ,   Anesthesia Other Findings   Reproductive/Obstetrics                           Anesthesia Physical Anesthesia Plan  ASA: III  Anesthesia Plan: General   Post-op Pain Management:    Induction: Intravenous  Airway Management Planned: Mask  Additional Equipment:   Intra-op Plan:   Post-operative Plan:   Informed Consent: I have reviewed the patients History and Physical, chart, labs and discussed the procedure including the risks, benefits and alternatives for the proposed anesthesia with the patient or authorized representative who has indicated his/her understanding and acceptance.     Plan Discussed with: CRNA  Anesthesia Plan Comments:         Anesthesia Quick Evaluation

## 2015-09-13 NOTE — Progress Notes (Signed)
SUBJECTIVE: The patient is doing well today.  At this time, she denies chest pain, shortness of breath, or any new concerns.  [name] amiodarone  200 mg Oral TID  . apixaban  5 mg Oral BID  . benazepril  10 mg Oral Daily  . diltiazem  180 mg Oral BID  . furosemide  20 mg Oral Daily  . glimepiride  4 mg Oral Q breakfast  . insulin aspart  0-9 Units Subcutaneous TID WC  . Liraglutide  1.8 mg Subcutaneous Daily  . magnesium oxide  400 mg Oral Daily  . metFORMIN  1,000 mg Oral BID WC  . metoprolol succinate  25 mg Oral BID  . off the beat book   Does not apply Once  . rosuvastatin  20 mg Oral Daily  . sodium chloride  3 mL Intravenous Q12H  . terazosin  5 mg Oral QHS      OBJECTIVE: Physical Exam: Filed Vitals:   09/12/15 0612 09/12/15 1348 09/12/15 2054 09/13/15 0500  BP: 121/67 120/66 124/71 108/47  Pulse: 92 98 87   Temp: 97.4 F (36.3 C) 98.6 F (37 C) 98.3 F (36.8 C) 98 F (36.7 C)  TempSrc: Oral Oral Oral Oral  Resp: 18 16    Height:      Weight: 234 lb 6.4 oz (106.323 kg)   233 lb 11.2 oz (106.006 kg)  SpO2: 98% 98% 100% 98%    Intake/Output Summary (Last 24 hours) at 09/13/15 [id number] Last data filed at 09/13/15 0506  Gross per 24 hour  Intake    903 ml  Output   3300 ml  Net  -2397 ml    Telemetry reveals Afib, 40's-80's, intermittently 110-120  GEN- The patient is obese, well appearing, alert and oriented x 3 today.   Head- normocephalic, atraumatic Eyes-  Sclera clear, conjunctiva pink Ears- hearing intact Oropharynx- clear Neck- supple, no JVP Lungs- Clear to ausculation bilaterally, normal work of breathing Heart- irreg-irreg, no significant murmurs, no rubs or gallops GI- soft, NT, ND, + BS Extremities- no clubbing, cyanosis, or edema Skin- no rash or lesion Psych- euthymic mood, full affect Neuro- no gross deficits appreciated  LABS: Basic Metabolic Panel:  Recent Labs  09/12/15 0432 09/13/15 0440  NA 140 139  K 4.4 4.1  CL 104 103  CO2  27 26  GLUCOSE 151* 140*  BUN 17 18  CREATININE 0.91 0.85  CALCIUM 9.1 9.0  MG 1.8 1.6*   Liver Function Tests:  Recent Labs  09/12/15 0432  AST 21  ALT 28  ALKPHOS 60  BILITOT 0.6  PROT 5.7*  ALBUMIN 3.1*     ASSESSMENT AND PLAN:  Active Problems:   Visit for monitoring Tikosyn therapy 1. Persistent atrial fibrillation Admitted 09-06-15 for Tikosyn load. Had torsades with tikosyn and QT remains prolonged several days afterwards. I think that she is a poor candidate for tikosyn going forward. Ultimately, I suspect that rate control may serve her best. Rate and rhythm control were discussed at length with patient and spouse yesterday with Dr. Rayann Heman, she would prefer to continue a rhythm control approach. Continue amiodarone and diltiazem K+ 4.1, Mag 1.6 (pending further replacement) Continue Eliquis for CHADS2VASC of 4 on ELiquis  Will proceed with cardioversion today if possible  2. Hypertension Stable No change required today  3. OSA Compliance with CPAP encouraged  4. Obesity Weight loss encouraged  Tommye Standard, PA-C 09/13/2015 7:02 AM  I have seen, examined the patient, and reviewed  the above assessment and plan.  On exam, iRRR.  Changes to above are made where necessary.  Will proceed with cardioversion today.  Risks of procedure discussed with patient who wishes to proceed.  Replete Mg.  Could probably discharge after cardioversion today with follow-up in AF clinic in 1 week.  Co Sign: Thompson Grayer, MD 09/13/2015 9:10 AM

## 2015-09-13 NOTE — Discharge Summary (Signed)
ELECTROPHYSIOLOGY PROCEDURE DISCHARGE SUMMARY    Patient ID: Doris Jefferson,  MRN: [id number], DOB/AGE: Jun 21, 1941 74 y.o.  Admit date: 09/06/2015 Discharge date: 09/13/2015  Primary Care Physician: Stephens Shire, MD Primary Cardiologist: Dr. Tamala Julian Electrophysiologist: Dr. Rayann Heman  Primary Discharge Diagnosis:  1.  Paroxysmal atrial fibrillation       CHADS2Vasc is at least 4 on Eliquis  Secondary Discharge Diagnosis:  1. HTN 2. DM 3. OSA 4. Obesity  Allergies  Allergen Reactions  . Minocin [Minocycline Hcl] Swelling and Other (See Comments)    Throat swelling  . Tape Rash     Procedures This Admission:  1.  Tikosyn loading 2. Torsades requiring defibrillation/CPR 09/08/15 2.  Direct current cardioversion on 09/13/15 by Dr Radford Pax which successfully restored SR.  There were no early apparent complications.   Brief HPI: Doris Jefferson is a 74 y.o. female with a past medical history as noted above.  They were referred to EP in the outpatient setting for treatment options of atrial fibrillation.  Risks, benefits, and alternatives to Tikosyn were reviewed with the patient who wished to proceed.    Hospital Course:  The patient was admitted 09/06/15 and Tikosyn was initiated.  Renal function and electrolytes were followed her magnesium replaced, she had significant QT prolongation on 09/08/15 and torsades that required CPR/defibrillation.  She was transferred to ICU, her Tikosyn and Peroxetine discontinued, and observed overnight, her rhythm remained SR.  She did revert back into AFib the following day with CVR and underwent direct current cardioversion which restored sinus rhythm this morning.  She c/o left knee pain, xray showed no acute fracture or dislocation and this has since resolved.  She has ambulated with PT who made no home recommendations, as well as with family up/down the hallway without difficulty or symptoms. She had an echo done with LVEF 55-60%.  She is felt  not to be a Tikosyn candidate going forward, with persistantly,  prolonged QTc, though improved from 09/08/15, and discussion with the patient and Dr. Rayann Heman, rate verses rhythm control strategies, and the patient wished to try rhythm control, she was started on amiodarone, and at this time she remains in SR this afternoon 60's-70's and will be discharged to home to f/u with the AFib clinic in 1 week for visit, EKG and labs (BMET/magnesium level).  We will continue PO Mag ox, though BID.  Her Amiodarone, to complete one week TID, then BID for a week, then daily, pending her f/u visit with the AFib clinic next week.  She will continue her furosemide as she was taking at home, discussed with her, was on 20mg  daily, stay off the Peroxetine at least for now.  On the day of discharge, she was examined by Dr Rayann Heman today who considered her stable for discharge to home.    Physical Exam: Filed Vitals:   09/13/15 0933 09/13/15 0953 09/13/15 1113 09/13/15 1400  BP: 133/61 143/54 118/42 119/50  Pulse:  85 67 74  Temp:  97.7 F (36.5 C)  98.2 F (36.8 C)  TempSrc:  Oral Oral Oral  Resp:  13 24 18   Height:      Weight:      SpO2:  98% 96% 99%    GEN- The patient is well appearing, alert and oriented x 3 today.   HEENT: normocephalic, atraumatic; sclera clear, conjunctiva pink; hearing intact; oropharynx clear; neck supple, no JVP Lungs- Clear to ausculation bilaterally, normal work of breathing.  No wheezes, rales, rhonchi  Heart- Regular rate and rhythm, no murmurs, rubs or gallops, PMI not laterally displaced GI- soft, non-tender, non-distended Extremities- no clubbing, cyanosis, trace edema MS- no significant deformity or atrophy Skin- warm and dry, no rash or lesion Psych- euthymic mood, full affect Neuro- strength and sensation are intact   Labs:   Lab Results  Component Value Date   WBC 9.1 08/11/2015   HGB 10.4* 08/11/2015   HCT 33.9* 08/11/2015   MCV 86.0 08/11/2015   PLT 185  08/11/2015     Recent Labs Lab 09/12/15 0432 09/13/15 0440  NA 140 139  K 4.4 4.1  CL 104 103  CO2 27 26  BUN 17 18  CREATININE 0.91 0.85  CALCIUM 9.1 9.0  PROT 5.7*  --   BILITOT 0.6  --   ALKPHOS 60  --   ALT 28  --   AST 21  --   GLUCOSE 151* 140*     Discharge Medications:    Medication List    STOP taking these medications        PARoxetine 20 MG tablet  Commonly known as:  PAXIL      TAKE these medications        amiodarone 200 MG tablet  Commonly known as:  PACERONE  Take 1 tablet (200 mg total) by mouth 3 (three) times daily. Take 3 times daily for 5 more days (to complete one week), then twice daily for one week, then once daily,  Notes to Patient:  On 12/10 - Take 2x per day for 1 week  On 12/17 - Take 1x per day until instructed otherwise     apixaban 5 MG Tabs tablet  Commonly known as:  ELIQUIS  Take 1 tablet (5 mg total) by mouth 2 (two) times daily.     benazepril 20 MG tablet  Commonly known as:  LOTENSIN  Take 20 mg by mouth daily. Pt is taking 10 mg (half a tablet) daily     diltiazem 180 MG 24 hr capsule  Commonly known as:  CARDIZEM CD  Take 1 capsule (180 mg total) by mouth 2 (two) times daily.     doxylamine (Sleep) 25 MG tablet  Commonly known as:  UNISOM  Take 25 mg by mouth at bedtime as needed for sleep.     Fish Oil 1000 MG Caps  Take 1 capsule by mouth daily.     furosemide 20 MG tablet  Commonly known as:  LASIX  Take 1 tablet (20 mg total) by mouth daily.     glimepiride 4 MG tablet  Commonly known as:  AMARYL  Take 4 mg by mouth daily with breakfast.     magnesium oxide 400 (241.3 MG) MG tablet  Commonly known as:  MAGNESIUM-OXIDE  Take 1 tablet (400 mg total) by mouth 2 (two) times daily.     metFORMIN 1000 MG tablet  Commonly known as:  GLUCOPHAGE  Take 1,000 mg by mouth 2 (two) times daily with a meal.     metoprolol succinate 25 MG 24 hr tablet  Commonly known as:  TOPROL-XL  Take 1 tablet (25 mg total)  by mouth 2 (two) times daily.     rosuvastatin 20 MG tablet  Commonly known as:  CRESTOR  Take 20 mg by mouth daily.     terazosin 5 MG capsule  Commonly known as:  HYTRIN  Take 5 mg by mouth at bedtime.     VICTOZA [facility]  Inject 1.8 mg into the  skin daily.        Disposition: home Discharge Instructions    Diet - low sodium heart healthy    Complete by:  As directed      Increase activity slowly    Complete by:  As directed           Follow-up Information    Follow up with CARROLL,DONNA, NP On 09/21/2015.   Specialties:  Nurse Practitioner, Cardiology   Why:  1:30PM   Contact information:   [place] [address] [phone]       Duration of Discharge Encounter: Greater than 30 minutes including physician time.  Venetia Night, PA-C 09/13/2015 4:38 PM    Thompson Grayer MD, [facility] 09/13/2015 8:47 PM

## 2015-09-13 NOTE — H&P (View-Only) (Signed)
SUBJECTIVE: The patient is doing well today.  At this time, she denies chest pain, shortness of breath, or any new concerns.  [name] amiodarone  200 mg Oral TID  . apixaban  5 mg Oral BID  . benazepril  10 mg Oral Daily  . diltiazem  180 mg Oral BID  . furosemide  20 mg Oral Daily  . glimepiride  4 mg Oral Q breakfast  . insulin aspart  0-9 Units Subcutaneous TID WC  . Liraglutide  1.8 mg Subcutaneous Daily  . magnesium oxide  400 mg Oral Daily  . metFORMIN  1,000 mg Oral BID WC  . metoprolol succinate  25 mg Oral BID  . off the beat book   Does not apply Once  . rosuvastatin  20 mg Oral Daily  . sodium chloride  3 mL Intravenous Q12H  . terazosin  5 mg Oral QHS      OBJECTIVE: Physical Exam: Filed Vitals:   09/12/15 0612 09/12/15 1348 09/12/15 2054 09/13/15 0500  BP: 121/67 120/66 124/71 108/47  Pulse: 92 98 87   Temp: 97.4 F (36.3 C) 98.6 F (37 C) 98.3 F (36.8 C) 98 F (36.7 C)  TempSrc: Oral Oral Oral Oral  Resp: 18 16    Height:      Weight: 234 lb 6.4 oz (106.323 kg)   233 lb 11.2 oz (106.006 kg)  SpO2: 98% 98% 100% 98%    Intake/Output Summary (Last 24 hours) at 09/13/15 [id number] Last data filed at 09/13/15 0506  Gross per 24 hour  Intake    903 ml  Output   3300 ml  Net  -2397 ml    Telemetry reveals Afib, 40's-80's, intermittently 110-120  GEN- The patient is obese, well appearing, alert and oriented x 3 today.   Head- normocephalic, atraumatic Eyes-  Sclera clear, conjunctiva pink Ears- hearing intact Oropharynx- clear Neck- supple, no JVP Lungs- Clear to ausculation bilaterally, normal work of breathing Heart- irreg-irreg, no significant murmurs, no rubs or gallops GI- soft, NT, ND, + BS Extremities- no clubbing, cyanosis, or edema Skin- no rash or lesion Psych- euthymic mood, full affect Neuro- no gross deficits appreciated  LABS: Basic Metabolic Panel:  Recent Labs  09/12/15 0432 09/13/15 0440  NA 140 139  K 4.4 4.1  CL 104 103  CO2  27 26  GLUCOSE 151* 140*  BUN 17 18  CREATININE 0.91 0.85  CALCIUM 9.1 9.0  MG 1.8 1.6*   Liver Function Tests:  Recent Labs  09/12/15 0432  AST 21  ALT 28  ALKPHOS 60  BILITOT 0.6  PROT 5.7*  ALBUMIN 3.1*     ASSESSMENT AND PLAN:  Active Problems:   Visit for monitoring Tikosyn therapy 1. Persistent atrial fibrillation Admitted 09-06-15 for Tikosyn load. Had torsades with tikosyn and QT remains prolonged several days afterwards. I think that she is a poor candidate for tikosyn going forward. Ultimately, I suspect that rate control may serve her best. Rate and rhythm control were discussed at length with patient and spouse yesterday with Dr. Rayann Heman, she would prefer to continue a rhythm control approach. Continue amiodarone and diltiazem K+ 4.1, Mag 1.6 (pending further replacement) Continue Eliquis for CHADS2VASC of 4 on ELiquis  Will proceed with cardioversion today if possible  2. Hypertension Stable No change required today  3. OSA Compliance with CPAP encouraged  4. Obesity Weight loss encouraged  Tommye Standard, PA-C 09/13/2015 7:02 AM  I have seen, examined the patient, and reviewed  the above assessment and plan.  On exam, iRRR.  Changes to above are made where necessary.  Will proceed with cardioversion today.  Risks of procedure discussed with patient who wishes to proceed.  Replete Mg.  Could probably discharge after cardioversion today with follow-up in AF clinic in 1 week.  Co Sign: Thompson Grayer, MD 09/13/2015 9:10 AM

## 2015-09-13 NOTE — CV Procedure (Signed)
   Electrical Cardioversion Procedure Note Doris Jefferson [id number] 1941-08-17  Procedure: Electrical Cardioversion Indications:  Atrial Fibrillation  Time Out: Verified patient identification, verified procedure,medications/allergies/relevent history reviewed, required imaging and test results available.  Performed  Procedure Details  The patient was NPO after midnight. Anesthesia was administered at the beside  by Dr.Fitzgerald with 80mg  of propofol and Lidocaine 60mg  IV.  Cardioversion was done with synchronized biphasic defibrillation with AP pads with 150watts.  The patient did not convert to NSR.   Cardioversion was done with synchronized biphasic defibrillation with AP pads with 200watts. The patient converted to normal sinus rhythm. The patient tolerated the procedure well   IMPRESSION:  Successful cardioversion of atrial fibrillation    Doris Jefferson R 09/13/2015, 10:12 AM

## 2015-09-13 NOTE — Anesthesia Postprocedure Evaluation (Signed)
Anesthesia Post Note  Patient: Doris Jefferson  Procedure(s) Performed: Procedure(s) (LRB): CARDIOVERSION (N/A)  Patient location during evaluation: PACU Anesthesia Type: General Level of consciousness: awake and alert Pain management: pain level controlled Vital Signs Assessment: post-procedure vital signs reviewed and stable Respiratory status: spontaneous breathing Cardiovascular status: blood pressure returned to baseline Anesthetic complications: no    Last Vitals:  Filed Vitals:   09/13/15 0953 09/13/15 1113  BP: 143/54 118/42  Pulse: 85 67  Temp: 36.5 C   Resp: 13 24    Last Pain:  Filed Vitals:   09/13/15 1114  PainSc: Asleep                 Tiajuana Amass

## 2015-09-14 ENCOUNTER — Encounter (HOSPITAL_COMMUNITY): Payer: Self-pay | Admitting: Cardiology

## 2015-09-19 ENCOUNTER — Other Ambulatory Visit (HOSPITAL_COMMUNITY): Payer: Self-pay | Admitting: Nurse Practitioner

## 2015-09-21 ENCOUNTER — Ambulatory Visit (HOSPITAL_COMMUNITY)
Admit: 2015-09-21 | Discharge: 2015-09-21 | Disposition: A | Discharge status: Home / Self Care | Payer: Medicare Other | Source: Ambulatory Visit | Attending: Nurse Practitioner | Admitting: Nurse Practitioner

## 2015-09-21 VITALS — BP 142/58 | HR 79 | Ht 63.0 in | Wt 221.4 lb

## 2015-09-21 DIAGNOSIS — I4819 Other persistent atrial fibrillation: Secondary | ICD-10-CM

## 2015-09-21 DIAGNOSIS — I481 Persistent atrial fibrillation: Secondary | ICD-10-CM

## 2015-09-21 DIAGNOSIS — I4891 Unspecified atrial fibrillation: Secondary | ICD-10-CM | POA: Diagnosis present

## 2015-09-21 LAB — BASIC METABOLIC PANEL
Anion gap: 9 (ref 5–15)
BUN: 17 mg/dL (ref 6–20)
CALCIUM: 9.1 mg/dL (ref 8.9–10.3)
CO2: 24 mmol/L (ref 22–32)
CREATININE: 1.06 mg/dL — AB (ref 0.44–1.00)
Chloride: 103 mmol/L (ref 101–111)
GFR calc non Af Amer: 50 mL/min — ABNORMAL LOW (ref >60)
GFR, EST AFRICAN AMERICAN: 58 mL/min — AB (ref >60)
Glucose, Bld: 184 mg/dL — ABNORMAL HIGH (ref 65–99)
Potassium: 4.1 mmol/L (ref 3.5–5.1)
SODIUM: 136 mmol/L (ref 135–145)

## 2015-09-21 LAB — MAGNESIUM: MAGNESIUM: 1.6 mg/dL — AB (ref 1.7–2.4)

## 2015-09-21 NOTE — Progress Notes (Signed)
Patient ID: IMA LAPIANA, female   DOB: 1941-03-28, 74 y.o.   MRN: [id number]     Primary Care Physician: Stephens Shire, MD Referring Physician: [facility] ([place]) f/u Cardiologist:Dr. Tamala Julian EP: Dr. Jerald Kief is a 74 y.o. female with a h/o persistent afib s/p hospitalization for tikosyn load.She had significant QT prolongation on 09/08/15 and torsades that required CPR/defibrillation. She was transferred to ICU, her Tikosyn and Peroxetine discontinued, and observed overnight, her rhythm remained SR. She did revert back into AFib the following day with CVR and underwent direct current cardioversion which restored sinus rhythm this morning. She c/o left knee pain, xray showed no acute fracture or dislocation and this has since resolved. She  ambulated with PT who made no home recommendations, as well as with family up/down the hallway without difficulty or symptoms. She had an echo done with LVEF 55-60%. She is felt not to be a Tikosyn candidate going forward, with persistantly, prolonged QTc, though improved from 09/08/15, and discussion with the patient and Dr. Rayann Heman, rate verses rhythm control strategies, and the patient wished to try rhythm control, she was started on amiodarone, and at this time she remains in SR this afternoon 60's-70's and will be discharged to home to f/u with the AFib clinic in 1 week for visit, EKG and labs (BMET/magnesium level). We will continue PO Mag ox, though BID. Her Amiodarone, to complete one week TID, then BID for a week, then daily, pending her f/u visit with the AFib clinic next week. She will continue her furosemide as she was taking at home, discussed with her, was on 20mg  daily, stay off the Peroxetine at least for now. On the day of discharge, she was examined by Dr Rayann Heman today who considered her stable for discharge to home.   In the afib clinic,12/13, she presents in [place], and feels well. No complaints. She will reduce amiodarone  to 200 mg once a day  on Saturday. Continues on eliquis without bleeding issues.  Today, she denies symptoms of palpitations, chest pain, shortness of breath, orthopnea, PND, lower extremity edema, dizziness, presyncope, syncope, or neurologic sequela. The patient is tolerating medications without difficulties and is otherwise without complaint today.   Past Medical History  Diagnosis Date  . Heart murmur   . Diabetes ([place])   . Hyperlipidemia   . OSA (obstructive sleep apnea)     severe with AHI 31/hr  . HTN (hypertension)   . PAF (paroxysmal atrial fibrillation) ([place])   . Obesity (BMI 30-39.9) 03/04/2014  . Anemia    Past Surgical History  Procedure Laterality Date  . Cardioversion N/A 12/19/2013    Procedure: CARDIOVERSION;  Surgeon: Sinclair Grooms, MD;  Location: [facility];  Service: Cardiovascular;  Laterality: N/A;  . Eye surgery    . Abdominal hysterectomy    . Spine surgery    . Cardioversion N/A 08/16/2015    Procedure: CARDIOVERSION;  Surgeon: Larey Dresser, MD;  Location: [facility];  Service: Cardiovascular;  Laterality: N/A;  . Cardioversion N/A 09/13/2015    Procedure: CARDIOVERSION;  Surgeon: Sueanne Margarita, MD;  Location: [facility];  Service: Cardiovascular;  Laterality: N/A;    Current Outpatient Prescriptions  Medication Sig Dispense Refill  . amiodarone (PACERONE) 200 MG tablet Take 1 tablet (200 mg total) by mouth 3 (three) times daily. Take 3 times daily for 5 more days (to complete one week), then twice daily for one week, then once daily, 43 tablet 0  .  benazepril (LOTENSIN) 20 MG tablet Take 20 mg by mouth daily. Pt is taking 10 mg (half a tablet) daily    . diltiazem (CARDIZEM CD) 180 MG 24 hr capsule Take 1 capsule (180 mg total) by mouth 2 (two) times daily. 60 capsule 6  . doxylamine, Sleep, (UNISOM) 25 MG tablet Take 25 mg by mouth at bedtime as needed for sleep.    [name] ELIQUIS 5 MG TABS tablet TAKE 1 TABLET BY MOUTH TWICE DAILY 60 tablet 6  . furosemide (LASIX) 20 MG  tablet Take 1 tablet (20 mg total) by mouth daily. 30 tablet 6  . glimepiride (AMARYL) 4 MG tablet Take 4 mg by mouth daily with breakfast.    . Liraglutide (VICTOZA [place]) Inject 1.8 mg into the skin daily.     . magnesium oxide (MAGNESIUM-OXIDE) 400 (241.3 MG) MG tablet Take 1 tablet (400 mg total) by mouth 2 (two) times daily. 60 tablet 3  . metFORMIN (GLUCOPHAGE) 1000 MG tablet Take 1,000 mg by mouth 2 (two) times daily with a meal.    . metoprolol succinate (TOPROL-XL) 25 MG 24 hr tablet Take 1 tablet (25 mg total) by mouth 2 (two) times daily. 60 tablet 3  . Omega-3 Fatty Acids (FISH OIL) 1000 MG CAPS Take 1 capsule by mouth daily.    . rosuvastatin (CRESTOR) 20 MG tablet Take 20 mg by mouth daily.    [name] terazosin (HYTRIN) 5 MG capsule Take 5 mg by mouth at bedtime.     No current facility-administered medications for this encounter.    Allergies  Allergen Reactions  . Minocin [Minocycline Hcl] Swelling and Other (See Comments)    Throat swelling  . Tape Rash    Social History   Social History  . Marital Status: Married    Spouse Name: N/A  . Number of Children: N/A  . Years of Education: N/A   Occupational History  . Not on file.   Social History Main Topics  . Smoking status: Never Smoker   . Smokeless tobacco: Never Used  . Alcohol Use: No  . Drug Use: No  . Sexual Activity: No   Other Topics Concern  . Not on file   Social History Narrative    Family History  Problem Relation Age of Onset  . Hypertension Father   . Heart disease Father   . Melanoma Mother   . Hypertension Brother     ROS- All systems are reviewed and negative except as per the HPI above  Physical Exam: Filed Vitals:   09/21/15 1339  BP: 142/58  Pulse: 79  Height: 5\' 3"  (1.6 m)  Weight: 221 lb 6.4 oz (100.426 kg)    GEN- The patient is well appearing, alert and oriented x 3 today.   Head- normocephalic, atraumatic Eyes-  Sclera clear, conjunctiva pink Ears- hearing  intact Oropharynx- clear Neck- supple, no JVP Lymph- no cervical lymphadenopathy Lungs- Clear to ausculation bilaterally, normal work of breathing Heart- Regular rate and rhythm, no murmurs, rubs or gallops, PMI not laterally displaced GI- soft, NT, ND, + BS Extremities- no clubbing, cyanosis, or edema MS- no significant deformity or atrophy Skin- no rash or lesion Psych- euthymic mood, full affect Neuro- strength and sensation are intact  EKG- NSR, LAD, Anterior infarct, age undetermined, St/t wave abnormality, consider inferolateral ischemia. Pr itn 178 ms, QRs 106 ms, QTc 502 ms Epic records reviewed    Assessment and Plan: 1. Afib Failed Tikosyn due to prolonged QT and torsades Maintaining  SR on amiodarone She is aware to reduce dose to 200 mg a day on Saturday Bmet/mag today Continue eliquis 5 mg bid   She is on recall to see Dr. Tamala Julian in March but will request appointment moved up to February. Afib clinic as needed F/u PCP next week as scheduled

## 2015-10-06 ENCOUNTER — Encounter: Payer: Self-pay | Admitting: Nurse Practitioner

## 2015-10-12 ENCOUNTER — Other Ambulatory Visit (HOSPITAL_COMMUNITY): Payer: Self-pay

## 2015-10-12 ENCOUNTER — Other Ambulatory Visit: Payer: Self-pay

## 2015-10-12 DIAGNOSIS — Z1231 Encounter for screening mammogram for malignant neoplasm of breast: Secondary | ICD-10-CM

## 2015-10-12 MED ORDER — AMIODARONE HCL 200 MG PO TABS: 200.0000 mg | ORAL_TABLET | Freq: Three times a day (TID) | ORAL | Status: DC

## 2015-10-12 MED ORDER — AMIODARONE HCL 200 MG PO TABS: 200.0000 mg | ORAL_TABLET | Freq: Every day | ORAL | Status: DC

## 2015-10-13 ENCOUNTER — Ambulatory Visit
Admission: RE | Admit: 2015-10-13 | Discharge: 2015-10-13 | Disposition: A | Discharge status: Home / Self Care | Payer: Medicare Other | Source: Ambulatory Visit

## 2015-10-13 DIAGNOSIS — Z1231 Encounter for screening mammogram for malignant neoplasm of breast: Secondary | ICD-10-CM

## 2015-11-22 ENCOUNTER — Other Ambulatory Visit (HOSPITAL_COMMUNITY): Payer: Self-pay | Admitting: Nurse Practitioner

## 2015-11-24 ENCOUNTER — Encounter (HOSPITAL_COMMUNITY): Payer: Self-pay | Admitting: Emergency Medicine

## 2015-11-24 ENCOUNTER — Emergency Department (HOSPITAL_COMMUNITY)
Admission: EM | Admit: 2015-11-24 | Discharge: 2015-11-24 | Disposition: A | Discharge status: Home / Self Care | Payer: Medicare Other | Attending: Emergency Medicine | Admitting: Emergency Medicine

## 2015-11-24 ENCOUNTER — Emergency Department (HOSPITAL_COMMUNITY): Payer: Medicare Other

## 2015-11-24 DIAGNOSIS — R011 Cardiac murmur, unspecified: Secondary | ICD-10-CM | POA: Insufficient documentation

## 2015-11-24 DIAGNOSIS — Z90710 Acquired absence of both cervix and uterus: Secondary | ICD-10-CM | POA: Diagnosis not present

## 2015-11-24 DIAGNOSIS — I480 Paroxysmal atrial fibrillation: Secondary | ICD-10-CM | POA: Insufficient documentation

## 2015-11-24 DIAGNOSIS — E785 Hyperlipidemia, unspecified: Secondary | ICD-10-CM | POA: Insufficient documentation

## 2015-11-24 DIAGNOSIS — J209 Acute bronchitis, unspecified: Secondary | ICD-10-CM | POA: Diagnosis not present

## 2015-11-24 DIAGNOSIS — Z79899 Other long term (current) drug therapy: Secondary | ICD-10-CM | POA: Insufficient documentation

## 2015-11-24 DIAGNOSIS — Z8669 Personal history of other diseases of the nervous system and sense organs: Secondary | ICD-10-CM | POA: Diagnosis not present

## 2015-11-24 DIAGNOSIS — E119 Type 2 diabetes mellitus without complications: Secondary | ICD-10-CM | POA: Diagnosis not present

## 2015-11-24 DIAGNOSIS — Z7984 Long term (current) use of oral hypoglycemic drugs: Secondary | ICD-10-CM | POA: Diagnosis not present

## 2015-11-24 DIAGNOSIS — R05 Cough: Secondary | ICD-10-CM | POA: Diagnosis present

## 2015-11-24 DIAGNOSIS — E669 Obesity, unspecified: Secondary | ICD-10-CM | POA: Insufficient documentation

## 2015-11-24 DIAGNOSIS — Z862 Personal history of diseases of the blood and blood-forming organs and certain disorders involving the immune mechanism: Secondary | ICD-10-CM | POA: Insufficient documentation

## 2015-11-24 DIAGNOSIS — I10 Essential (primary) hypertension: Secondary | ICD-10-CM | POA: Diagnosis not present

## 2015-11-24 DIAGNOSIS — N3091 Cystitis, unspecified with hematuria: Secondary | ICD-10-CM | POA: Diagnosis not present

## 2015-11-24 LAB — CBC WITH DIFFERENTIAL/PLATELET
BASOS PCT: 0 %
Basophils Absolute: 0 10*3/uL (ref 0.0–0.1)
EOS ABS: 0.2 10*3/uL (ref 0.0–0.7)
EOS PCT: 2 %
HEMATOCRIT: 36.5 % (ref 36.0–46.0)
Hemoglobin: 11.5 g/dL — ABNORMAL LOW (ref 12.0–15.0)
Lymphocytes Relative: 24 %
Lymphs Abs: 2.4 10*3/uL (ref 0.7–4.0)
MCH: 25.2 pg — ABNORMAL LOW (ref 26.0–34.0)
MCHC: 31.5 g/dL (ref 30.0–36.0)
MCV: 79.9 fL (ref 78.0–100.0)
MONOS PCT: 5 %
Monocytes Absolute: 0.5 10*3/uL (ref 0.1–1.0)
Neutro Abs: 7.1 10*3/uL (ref 1.7–7.7)
Neutrophils Relative %: 69 %
Platelets: 185 10*3/uL (ref 150–400)
RBC: 4.57 MIL/uL (ref 3.87–5.11)
RDW: 18.9 % — AB (ref 11.5–15.5)
WBC: 10.2 10*3/uL (ref 4.0–10.5)

## 2015-11-24 LAB — URINALYSIS, ROUTINE W REFLEX MICROSCOPIC
BILIRUBIN URINE: NEGATIVE
GLUCOSE, UA: 100 mg/dL — AB
Ketones, ur: NEGATIVE mg/dL
Nitrite: NEGATIVE
Specific Gravity, Urine: 1.023 (ref 1.005–1.030)
pH: 6 (ref 5.0–8.0)

## 2015-11-24 LAB — URINE MICROSCOPIC-ADD ON

## 2015-11-24 LAB — COMPREHENSIVE METABOLIC PANEL
ALBUMIN: 3.4 g/dL — AB (ref 3.5–5.0)
ALK PHOS: 63 U/L (ref 38–126)
ALT: 26 U/L (ref 14–54)
AST: 26 U/L (ref 15–41)
Anion gap: 14 (ref 5–15)
BILIRUBIN TOTAL: 0.4 mg/dL (ref 0.3–1.2)
BUN: 19 mg/dL (ref 6–20)
CALCIUM: 9.2 mg/dL (ref 8.9–10.3)
CO2: 20 mmol/L — AB (ref 22–32)
CREATININE: 0.97 mg/dL (ref 0.44–1.00)
Chloride: 107 mmol/L (ref 101–111)
GFR calc Af Amer: >60 mL/min (ref >60)
GFR calc non Af Amer: 56 mL/min — ABNORMAL LOW (ref >60)
GLUCOSE: 247 mg/dL — AB (ref 65–99)
Potassium: 4.1 mmol/L (ref 3.5–5.1)
SODIUM: 141 mmol/L (ref 135–145)
Total Protein: 6.4 g/dL — ABNORMAL LOW (ref 6.5–8.1)

## 2015-11-24 LAB — TROPONIN I

## 2015-11-24 LAB — BRAIN NATRIURETIC PEPTIDE: B Natriuretic Peptide: 122 pg/mL — ABNORMAL HIGH (ref 0.0–100.0)

## 2015-11-24 MED ORDER — PHENAZOPYRIDINE HCL 200 MG PO TABS: 200.0000 mg | ORAL_TABLET | Freq: Three times a day (TID) | ORAL | Status: DC | PRN

## 2015-11-24 MED ORDER — LEVOFLOXACIN 750 MG PO TABS: 750.0000 mg | ORAL_TABLET | Freq: Every day | ORAL | Status: DC

## 2015-11-24 NOTE — Discharge Instructions (Signed)
Urinary Tract Infection Urinary tract infections (UTIs) can develop anywhere along your urinary tract. Your urinary tract is your body's drainage system for removing wastes and extra water. Your urinary tract includes two kidneys, two ureters, a bladder, and a urethra. Your kidneys are a pair of bean-shaped organs. Each kidney is about the size of your fist. They are located below your ribs, one on each side of your spine. CAUSES Infections are caused by microbes, which are microscopic organisms, including fungi, viruses, and bacteria. These organisms are so small that they can only be seen through a microscope. Bacteria are the microbes that most commonly cause UTIs. SYMPTOMS  Symptoms of UTIs may vary by age and gender of the patient and by the location of the infection. Symptoms in young women typically include a frequent and intense urge to urinate and a painful, burning feeling in the bladder or urethra during urination. Older women and men are more likely to be tired, shaky, and weak and have muscle aches and abdominal pain. A fever may mean the infection is in your kidneys. Other symptoms of a kidney infection include pain in your back or sides below the ribs, nausea, and vomiting. DIAGNOSIS To diagnose a UTI, your caregiver will ask you about your symptoms. Your caregiver will also ask you to provide a urine sample. The urine sample will be tested for bacteria and white blood cells. White blood cells are made by your body to help fight infection. TREATMENT  Typically, UTIs can be treated with medication. Because most UTIs are caused by a bacterial infection, they usually can be treated with the use of antibiotics. The choice of antibiotic and length of treatment depend on your symptoms and the type of bacteria causing your infection. HOME CARE INSTRUCTIONS  If you were prescribed antibiotics, take them exactly as your caregiver instructs you. Finish the medication even if you feel better after  you have only taken some of the medication.  Drink enough water and fluids to keep your urine clear or pale yellow.  Avoid caffeine, tea, and carbonated beverages. They tend to irritate your bladder.  Empty your bladder often. Avoid holding urine for long periods of time.  Empty your bladder before and after sexual intercourse.  After a bowel movement, women should cleanse from front to back. Use each tissue only once. SEEK MEDICAL CARE IF:   You have back pain.  You develop a fever.  Your symptoms do not begin to resolve within 3 days. SEEK IMMEDIATE MEDICAL CARE IF:   You have severe back pain or lower abdominal pain.  You develop chills.  You have nausea or vomiting.  You have continued burning or discomfort with urination. MAKE SURE YOU:   Understand these instructions.  Will watch your condition.  Will get help right away if you are not doing well or get worse.   This information is not intended to replace advice given to you by your health care provider. Make sure you discuss any questions you have with your health care provider.   Document Released: 07/05/2005 Document Revised: 06/16/2015 Document Reviewed: 11/03/2011 Elsevier Interactive Patient Education 2016 [employer]  Acute Bronchitis Bronchitis is inflammation of the airways that extend from the windpipe into the lungs (bronchi). The inflammation often causes mucus to develop. This leads to a cough, which is the most common symptom of bronchitis.  In acute bronchitis, the condition usually develops suddenly and goes away over time, usually in a couple weeks. Smoking, allergies, and asthma  can make bronchitis worse. Repeated episodes of bronchitis may cause further lung problems.  CAUSES Acute bronchitis is most often caused by the same virus that causes a cold. The virus can spread from person to person (contagious) through coughing, sneezing, and touching contaminated objects. SIGNS AND SYMPTOMS    Cough.   Fever.   Coughing up mucus.   Body aches.   Chest congestion.   Chills.   Shortness of breath.   Sore throat.  DIAGNOSIS  Acute bronchitis is usually diagnosed through a physical exam. Your health care provider will also ask you questions about your medical history. Tests, such as chest X-rays, are sometimes done to rule out other conditions.  TREATMENT  Acute bronchitis usually goes away in a couple weeks. Oftentimes, no medical treatment is necessary. Medicines are sometimes given for relief of fever or cough. Antibiotic medicines are usually not needed but may be prescribed in certain situations. In some cases, an inhaler may be recommended to help reduce shortness of breath and control the cough. A cool mist vaporizer may also be used to help thin bronchial secretions and make it easier to clear the chest.  HOME CARE INSTRUCTIONS  Get plenty of rest.   Drink enough fluids to keep your urine clear or pale yellow (unless you have a medical condition that requires fluid restriction). Increasing fluids may help thin your respiratory secretions (sputum) and reduce chest congestion, and it will prevent dehydration.   Take medicines only as directed by your health care provider.  If you were prescribed an antibiotic medicine, finish it all even if you start to feel better.  Avoid smoking and secondhand smoke. Exposure to cigarette smoke or irritating chemicals will make bronchitis worse. If you are a smoker, consider using nicotine gum or skin patches to help control withdrawal symptoms. Quitting smoking will help your lungs heal faster.   Reduce the chances of another bout of acute bronchitis by washing your hands frequently, avoiding people with cold symptoms, and trying not to touch your hands to your mouth, nose, or eyes.   Keep all follow-up visits as directed by your health care provider.  SEEK MEDICAL CARE IF: Your symptoms do not improve after 1 week  of treatment.  SEEK IMMEDIATE MEDICAL CARE IF:  You develop an increased fever or chills.   You have chest pain.   You have severe shortness of breath.  You have bloody sputum.   You develop dehydration.  You faint or repeatedly feel like you are going to pass out.  You develop repeated vomiting.  You develop a severe headache. MAKE SURE YOU:   Understand these instructions.  Will watch your condition.  Will get help right away if you are not doing well or get worse.   This information is not intended to replace advice given to you by your health care provider. Make sure you discuss any questions you have with your health care provider.   Document Released: 11/02/2004 Document Revised: 10/16/2014 Document Reviewed: 03/18/2013 Elsevier Interactive Patient Education [employer].

## 2015-11-24 NOTE — ED Notes (Signed)
Pt. reports persistent productive cough with chest congestion onset last week and hematuria / dysuria onset last night , she is taking Eliquis for Afib. Denies fever or chills .

## 2015-11-24 NOTE — ED Provider Notes (Signed)
CSN: [id number]     Arrival date & time 11/24/15  [id number] History   First MD Initiated Contact with Patient 11/24/15 [phone]     Chief Complaint  Patient presents with  . Cough  . Hematuria     (Consider location/radiation/quality/duration/timing/severity/associated sxs/prior Treatment) HPI Comments: Patient is a 75 year old female with history of diabetes and atrial fibrillation for which she is taking eliquis. He presents today for evaluation of persistent cough for one week which has been productive of green sputum. She denies any chest pain or fever. She denies any palpitations. She is also having urinary frequency and is noticing blood in her urine. She has a history of urinary tract infections that have presented in a similar fashion.  Patient is a 75 y.o. female presenting with cough. The history is provided by the patient.  Cough Cough characteristics:  Productive Sputum characteristics:  Green Severity:  Moderate Onset quality:  Gradual Duration:  1 week Timing:  Constant Progression:  Worsening Chronicity:  New Smoker: no   Relieved by:  Nothing Worsened by:  Nothing tried Ineffective treatments:  None tried Associated symptoms: no chills and no fever     Past Medical History  Diagnosis Date  . Heart murmur   . Diabetes ([place])   . Hyperlipidemia   . OSA (obstructive sleep apnea)     severe with AHI 31/hr  . HTN (hypertension)   . PAF (paroxysmal atrial fibrillation) ([place])   . Obesity (BMI 30-39.9) 03/04/2014  . Anemia    Past Surgical History  Procedure Laterality Date  . Cardioversion N/A 12/19/2013    Procedure: CARDIOVERSION;  Surgeon: Sinclair Grooms, MD;  Location: [facility];  Service: Cardiovascular;  Laterality: N/A;  . Eye surgery    . Abdominal hysterectomy    . Spine surgery    . Cardioversion N/A 08/16/2015    Procedure: CARDIOVERSION;  Surgeon: Larey Dresser, MD;  Location: [facility];  Service: Cardiovascular;  Laterality: N/A;  . Cardioversion  N/A 09/13/2015    Procedure: CARDIOVERSION;  Surgeon: Sueanne Margarita, MD;  [facility];  Service: Cardiovascular;  Laterality: N/A;   Family History  Problem Relation Age of Onset  . Hypertension Father   . Heart disease Father   . Melanoma Mother   . Hypertension Brother    Social History  Substance Use Topics  . Smoking status: Never Smoker   . Smokeless tobacco: Never Used  . Alcohol Use: No   OB History    No data available     Review of Systems  Constitutional: Negative for fever and chills.  Respiratory: Positive for cough.   All other systems reviewed and are negative.     Allergies  Minocin and Tape  Home Medications   Prior to Admission medications   Medication Sig Start Date End Date Taking? Authorizing Provider  amiodarone (PACERONE) 200 MG tablet Take 1 tablet (200 mg total) by mouth daily. 10/12/15  Yes Sherran Needs, NP  benazepril (LOTENSIN) 20 MG tablet Take 20 mg by mouth daily. Pt is taking 10 mg (half a tablet) daily   Yes Historical Provider, MD  diltiazem (CARDIZEM CD) 180 MG 24 hr capsule Take 1 capsule (180 mg total) by mouth 2 (two) times daily. 08/11/15  Yes Sherran Needs, NP  doxylamine, Sleep, (UNISOM) 25 MG tablet Take 25 mg by mouth at bedtime as needed for sleep.   Yes Historical Provider, MD  ELIQUIS 5 MG TABS tablet TAKE 1 TABLET BY  MOUTH TWICE DAILY 09/20/15  Yes Sherran Needs, NP  furosemide (LASIX) 20 MG tablet Take 1 tablet (20 mg total) by mouth daily. 09/13/15  Yes Renee Dyane Dustman, PA-C  glimepiride (AMARYL) 4 MG tablet Take 4 mg by mouth daily with breakfast.   Yes Historical Provider, MD  Liraglutide (VICTOZA [place]) Inject 1.8 mg into the skin daily.    Yes Historical Provider, MD  magnesium oxide (MAGNESIUM-OXIDE) 400 (241.3 MG) MG tablet Take 1 tablet (400 mg total) by mouth 2 (two) times daily. 09/13/15  Yes Renee Dyane Dustman, PA-C  metFORMIN (GLUCOPHAGE) 1000 MG tablet Take 1,000 mg by mouth 2 (two) times daily with a  meal.   Yes Historical Provider, MD  metoprolol succinate (TOPROL-XL) 25 MG 24 hr tablet Take 1 tablet (25 mg total) by mouth 2 (two) times daily. 08/11/15  Yes Sherran Needs, NP  Omega-3 Fatty Acids (FISH OIL) 1000 MG CAPS Take 1 capsule by mouth daily.   Yes Historical Provider, MD  rosuvastatin (CRESTOR) 20 MG tablet Take 20 mg by mouth daily.   Yes Historical Provider, MD  terazosin (HYTRIN) 5 MG capsule Take 5 mg by mouth at bedtime.   Yes Historical Provider, MD  amiodarone (PACERONE) 200 MG tablet TAKE 1 TABLET(200 MG) BY MOUTH DAILY Patient not taking: Reported on 11/24/2015 11/22/15   Sherran Needs, NP   BP 131/53 mmHg  Pulse 78  Temp(Src) 98.7 F (37.1 C) (Oral)  Resp 16  Ht 5\' 3"  (1.6 m)  Wt 212 lb (96.163 kg)  BMI 37.56 kg/m2  SpO2 99% Physical Exam  Constitutional: She is oriented to person, place, and time. She appears well-developed and well-nourished. No distress.  HENT:  Head: Normocephalic and atraumatic.  Neck: Normal range of motion. Neck supple.  Cardiovascular: Normal rate and regular rhythm.  Exam reveals no gallop and no friction rub.   No murmur heard. Pulmonary/Chest: Effort normal and breath sounds normal. No respiratory distress. She has no wheezes. She has no rales.  Abdominal: Soft. Bowel sounds are normal. She exhibits no distension. There is no tenderness.  Musculoskeletal: Normal range of motion. She exhibits no edema.  There is no calf tenderness and Homans sign is absent bilaterally.  Neurological: She is alert and oriented to person, place, and time.  Skin: Skin is warm and dry. She is not diaphoretic.  Nursing note and vitals reviewed.   ED Course  Procedures (including critical care time) Labs Review Labs Reviewed  COMPREHENSIVE METABOLIC PANEL  CBC WITH DIFFERENTIAL/PLATELET  BRAIN NATRIURETIC PEPTIDE  TROPONIN I  URINALYSIS, ROUTINE W REFLEX MICROSCOPIC (NOT AT [facility])    Imaging Review No results found. I have personally reviewed  and evaluated these images and lab results as part of my medical decision-making.   EKG Interpretation   Date/Time:  Wednesday November 24 2015 07:34:18 EST Ventricular Rate:  78 PR Interval:  173 QRS Duration: 115 QT Interval:  428 QTC Calculation: 487 R Axis:   -47 Text Interpretation:  Sinus rhythm Left axis deviation Nonspecific T wave  abnormality Confirmed by Talha Iser  MD, Pahoua Schreiner ([id number]) on 11/24/2015 8:01:35 AM      MDM   Final diagnoses:  None    Workup reveals no evidence for a cardiac etiology, and no evidence for pneumonia or other pulmonary abnormality. Her urinalysis reveals blood and subtle evidence for an infection. She has had UTIs in the past that have presented with hematuria. Given her dysuria and frequency I suspect the red cells  are skewing the results of the urinalysis and will treat as this is a UTI. She will be given Levaquin, Pyridium, and is to return as needed if symptoms worsen or change, or do not improve in the next several days.    Veryl Speak, MD 11/24/15 [phone]

## 2015-11-24 NOTE — ED Notes (Signed)
Pt ambulatory w/ steady gait to restroom. 

## 2015-11-24 NOTE — ED Notes (Signed)
Pt verbalized understanding of d/c instructions, prescriptions, and follow-up care. No further questions/concerns, VSS, ambulatory w/ steady gait (refused wheelchair) 

## 2015-12-13 ENCOUNTER — Other Ambulatory Visit (HOSPITAL_COMMUNITY): Payer: Self-pay | Admitting: Nurse Practitioner

## 2016-02-18 ENCOUNTER — Ambulatory Visit (INDEPENDENT_AMBULATORY_CARE_PROVIDER_SITE_OTHER): Payer: Medicare Other | Admitting: Interventional Cardiology

## 2016-02-18 ENCOUNTER — Encounter: Payer: Self-pay | Admitting: Interventional Cardiology

## 2016-02-18 VITALS — BP 136/54 | HR 78 | Ht 63.0 in | Wt 220.4 lb

## 2016-02-18 DIAGNOSIS — I272 Other secondary pulmonary hypertension: Secondary | ICD-10-CM

## 2016-02-18 DIAGNOSIS — I350 Nonrheumatic aortic (valve) stenosis: Secondary | ICD-10-CM

## 2016-02-18 DIAGNOSIS — I10 Essential (primary) hypertension: Secondary | ICD-10-CM | POA: Diagnosis not present

## 2016-02-18 DIAGNOSIS — I481 Persistent atrial fibrillation: Secondary | ICD-10-CM

## 2016-02-18 DIAGNOSIS — Z5181 Encounter for therapeutic drug level monitoring: Secondary | ICD-10-CM

## 2016-02-18 DIAGNOSIS — G4733 Obstructive sleep apnea (adult) (pediatric): Secondary | ICD-10-CM

## 2016-02-18 DIAGNOSIS — I4819 Other persistent atrial fibrillation: Secondary | ICD-10-CM

## 2016-02-18 DIAGNOSIS — Z7901 Long term (current) use of anticoagulants: Secondary | ICD-10-CM

## 2016-02-18 LAB — HEPATIC FUNCTION PANEL
ALT: 19 U/L (ref 6–29)
AST: 18 U/L (ref 10–35)
Albumin: 3.5 g/dL — ABNORMAL LOW (ref 3.6–5.1)
Alkaline Phosphatase: 67 U/L (ref 33–130)
BILIRUBIN DIRECT: 0.1 mg/dL (ref <=0.2)
BILIRUBIN INDIRECT: 0.1 mg/dL — AB (ref 0.2–1.2)
TOTAL PROTEIN: 6 g/dL — AB (ref 6.1–8.1)
Total Bilirubin: 0.2 mg/dL (ref 0.2–1.2)

## 2016-02-18 LAB — TSH: TSH: 1.51 m[IU]/L

## 2016-02-18 NOTE — Patient Instructions (Signed)
Your physician recommends that you continue on your current medications as directed. Please refer to the Current Medication list given to you today. Dr. Tamala Julian recommends any over the counter antihistamine WITHOUT DECONGESTANT is ok to use for allergy symptoms  Your physician recommends that you return for lab work in: today (LFT, TSH) Your physician recommends that you return for lab work in: [place] (LFT, TSH)  A chest x-ray takes a picture of the organs and structures inside the chest, including the heart, lungs, and blood vessels. This test can show several things, including, whether the heart is enlarges; whether fluid is building up in the lungs; and whether pacemaker / defibrillator leads are still in place. Your physician wants you to follow-up in: [place] Tamala Julian.   You will receive a reminder letter in the mail two months in advance. If you don't receive a letter, please call our office to schedule the follow-up appointment.

## 2016-02-18 NOTE — Progress Notes (Signed)
Cardiology Office Note   Date:  02/18/2016   ID:  Doris Jefferson, DOB 05/15/1941, MRN [id number]  PCP:  Stephens Shire, MD  Cardiologist:  Sinclair Grooms, MD   Chief Complaint  Patient presents with  . Atrial Fibrillation      History of Present Illness: Doris Jefferson is a 75 y.o. female who presents for Follow-up of persistent afib s/p hospitalization for tikosyn load.She had significant QT prolongation on 09/08/15 and torsades that required CPR/defibrillation. Ultimately started on amiodarone and maintaining normal sinus rhythm since  She failed Tikosyn as noted above. She has now on low-dose amiodarone 200 mg per day without difficulty. When atrial fibrillation is present she has marked fatigue and dyspnea. She denies any complaint such as chest pain, orthopnea, and PND. She does have mild lower extremity swelling.  Past Medical History  Diagnosis Date  . Heart murmur   . Diabetes ([place])   . Hyperlipidemia   . OSA (obstructive sleep apnea)     severe with AHI 31/hr  . HTN (hypertension)   . PAF (paroxysmal atrial fibrillation) ([place])   . Obesity (BMI 30-39.9) 03/04/2014  . Anemia     Past Surgical History  Procedure Laterality Date  . Cardioversion N/A 12/19/2013    Procedure: CARDIOVERSION;  Surgeon: Sinclair Grooms, MD;  Location: [facility];  Service: Cardiovascular;  Laterality: N/A;  . Eye surgery    . Abdominal hysterectomy    . Spine surgery    . Cardioversion N/A 08/16/2015    Procedure: CARDIOVERSION;  Surgeon: Larey Dresser, MD;  Location: [facility];  Service: Cardiovascular;  Laterality: N/A;  . Cardioversion N/A 09/13/2015    Procedure: CARDIOVERSION;  Surgeon: Sueanne Margarita, MD;  Location: [facility];  Service: Cardiovascular;  Laterality: N/A;     Current Outpatient Prescriptions  Medication Sig Dispense Refill  . amiodarone (PACERONE) 200 MG tablet Take 1 tablet (200 mg total) by mouth daily. 90 tablet 6  . benazepril (LOTENSIN) 20 MG  tablet Take 20 mg by mouth daily. Pt is taking 10 mg (half a tablet) daily    . buPROPion (WELLBUTRIN XL) 150 MG 24 hr tablet Take 150 mg by mouth 2 (two) times daily.    [name] diltiazem (CARDIZEM CD) 180 MG 24 hr capsule Take 1 capsule (180 mg total) by mouth 2 (two) times daily. 60 capsule 6  . doxylamine, Sleep, (UNISOM) 25 MG tablet Take 25 mg by mouth at bedtime as needed for sleep.    [name] ELIQUIS 5 MG TABS tablet TAKE 1 TABLET BY MOUTH TWICE DAILY 60 tablet 6  . furosemide (LASIX) 20 MG tablet Take 1 tablet (20 mg total) by mouth daily. 30 tablet 6  . glimepiride (AMARYL) 4 MG tablet Take 4 mg by mouth daily with breakfast.    . Liraglutide (VICTOZA [place]) Inject 1.8 mg into the skin daily.     . magnesium oxide (MAGNESIUM-OXIDE) 400 (241.3 MG) MG tablet Take 1 tablet (400 mg total) by mouth 2 (two) times daily. 60 tablet 3  . metFORMIN (GLUCOPHAGE) 1000 MG tablet Take 1,000 mg by mouth 2 (two) times daily with a meal.    . metoprolol succinate (TOPROL-XL) 25 MG 24 hr tablet TAKE 1 TABLET(25 MG) BY MOUTH TWICE DAILY 60 tablet 6  . rosuvastatin (CRESTOR) 20 MG tablet Take 20 mg by mouth daily.    [name] terazosin (HYTRIN) 5 MG capsule Take 5 mg by mouth at bedtime.  No current facility-administered medications for this visit.    Allergies:   Minocin and Tape    Social History:  The patient  reports that she has never smoked. She has never used smokeless tobacco. She reports that she does not drink alcohol or use illicit drugs.   Family History:  The patient's family history includes Heart disease in her father; Hypertension in her brother and father; Melanoma in her mother.    ROS:  Please see the history of present illness.   Otherwise, review of systems are positive for Upper respiratory allergies. Recent nosebleed. Dry cough..   All other systems are reviewed and negative.    PHYSICAL EXAM: VS:  BP 136/54 mmHg  Pulse 78  Ht 5\' 3"  (1.6 m)  Wt 220 lb 6.4 oz (99.973 kg)  BMI 39.05 kg/m2 ,  BMI Body mass index is 39.05 kg/(m^2). GEN: Well nourished, well developed, in no acute distress HEENT: normal Neck: no JVD, carotid bruits, or masses Cardiac: RRR.  There is a 2/6 crescendo decrescendo systolic murmur. An S4 gallop is heard but no rub. There is trace bilateral edema. Respiratory:  clear to auscultation bilaterally, normal work of breathing. GI: soft, nontender, nondistended, + BS MS: no deformity or atrophy Skin: warm and dry, no rash Neuro:  Strength and sensation are intact Psych: euthymic mood, full affect   EKG:  EKG is not ordered.   Recent Labs: 08/23/2015: TSH 1.715 09/21/2015: Magnesium 1.6* 11/24/2015: ALT 26; B Natriuretic Peptide 122.0*; BUN 19; Creatinine, Ser 0.97; Hemoglobin 11.5*; Platelets 185; Potassium 4.1; Sodium 141    Lipid Panel No results found for: CHOL, TRIG, HDL, CHOLHDL, VLDL, LDLCALC, LDLDIRECT    Wt Readings from Last 3 Encounters:  02/18/16 220 lb 6.4 oz (99.973 kg)  11/24/15 212 lb (96.163 kg)  09/21/15 221 lb 6.4 oz (100.426 kg)      Other studies Reviewed: Additional studies/ records that were reviewed today include: Review of prior records. The findings include review of cardioversion data.    ASSESSMENT AND PLAN:  1. Persistent atrial fibrillation (HCC) Resolved normal sinus rhythm on amiodarone  2. Anticoagulation goal of INR 2 to 3 On the pixel been without significant complications  3. Essential hypertension Very well controlled  4. Aortic stenosis, mild Mild by echocardiogram in November  5. Moderate to severe pulmonary hypertension (HCC) Suspect this will be improved when her next study is done.  6. OSA (obstructive sleep apnea) Compliant was C Pap    Current medicines are reviewed at length with the patient today.  The patient has the following concerns regarding medicines: None.  The following changes/actions have been instituted:    TSH and hepatic panel today and on return in 6 months.  PA  and lateral chest x-ray in 6 months  I advised her that any antihistamine without decongestants would be okay to use for allergies.  Encouraged aerobic activity.  Labs/ tests ordered today include:  No orders of the defined types were placed in this encounter.     Disposition:   FU with HS in 6 months  Signed, Sinclair Grooms, MD  02/18/2016 9:10 AM    [facility] [place], [place], [place]  [address] Phone: [phone]; Fax: [phone]

## 2016-02-23 DIAGNOSIS — H269 Unspecified cataract: Secondary | ICD-10-CM | POA: Insufficient documentation

## 2016-02-23 DIAGNOSIS — I480 Paroxysmal atrial fibrillation: Secondary | ICD-10-CM | POA: Insufficient documentation

## 2016-02-23 DIAGNOSIS — E7800 Pure hypercholesterolemia, unspecified: Secondary | ICD-10-CM | POA: Insufficient documentation

## 2016-02-23 DIAGNOSIS — K219 Gastro-esophageal reflux disease without esophagitis: Secondary | ICD-10-CM | POA: Insufficient documentation

## 2016-03-02 ENCOUNTER — Other Ambulatory Visit (HOSPITAL_COMMUNITY): Payer: Self-pay | Admitting: Nurse Practitioner

## 2016-03-17 ENCOUNTER — Other Ambulatory Visit (HOSPITAL_COMMUNITY): Payer: Self-pay | Admitting: Nurse Practitioner

## 2016-04-01 ENCOUNTER — Other Ambulatory Visit (HOSPITAL_COMMUNITY): Payer: Self-pay | Admitting: Nurse Practitioner

## 2016-05-04 ENCOUNTER — Other Ambulatory Visit: Payer: Self-pay | Admitting: Physician Assistant

## 2016-06-19 ENCOUNTER — Other Ambulatory Visit (HOSPITAL_COMMUNITY): Payer: Self-pay | Admitting: Nurse Practitioner

## 2016-07-17 ENCOUNTER — Other Ambulatory Visit (HOSPITAL_COMMUNITY): Payer: Self-pay | Admitting: Nurse Practitioner

## 2016-08-18 ENCOUNTER — Other Ambulatory Visit (HOSPITAL_COMMUNITY): Payer: Self-pay | Admitting: Nurse Practitioner

## 2016-08-18 NOTE — Telephone Encounter (Signed)
Okay to refill under Dr Tamala Julian?

## 2016-08-18 NOTE — Telephone Encounter (Signed)
That will be fine. 

## 2016-09-05 ENCOUNTER — Encounter: Payer: Self-pay | Admitting: Interventional Cardiology

## 2016-09-07 ENCOUNTER — Ambulatory Visit
Admission: RE | Admit: 2016-09-07 | Discharge: 2016-09-07 | Disposition: A | Discharge status: Home / Self Care | Payer: Medicare Other | Source: Ambulatory Visit | Attending: Interventional Cardiology | Admitting: Interventional Cardiology

## 2016-09-07 ENCOUNTER — Other Ambulatory Visit: Payer: Medicare Other | Admitting: *Deleted

## 2016-09-07 DIAGNOSIS — I272 Pulmonary hypertension, unspecified: Secondary | ICD-10-CM

## 2016-09-07 DIAGNOSIS — G4733 Obstructive sleep apnea (adult) (pediatric): Secondary | ICD-10-CM

## 2016-09-07 DIAGNOSIS — I4819 Other persistent atrial fibrillation: Secondary | ICD-10-CM

## 2016-09-07 DIAGNOSIS — I10 Essential (primary) hypertension: Secondary | ICD-10-CM

## 2016-09-07 DIAGNOSIS — I350 Nonrheumatic aortic (valve) stenosis: Secondary | ICD-10-CM

## 2016-09-07 DIAGNOSIS — Z7901 Long term (current) use of anticoagulants: Secondary | ICD-10-CM

## 2016-09-07 DIAGNOSIS — Z5181 Encounter for therapeutic drug level monitoring: Secondary | ICD-10-CM

## 2016-09-07 LAB — HEPATIC FUNCTION PANEL
ALBUMIN: 3.7 g/dL (ref 3.6–5.1)
ALT: 31 U/L — AB (ref 6–29)
AST: 36 U/L — AB (ref 10–35)
Alkaline Phosphatase: 51 U/L (ref 33–130)
Bilirubin, Direct: 0.1 mg/dL (ref <=0.2)
Indirect Bilirubin: 0.2 mg/dL (ref 0.2–1.2)
TOTAL PROTEIN: 5.8 g/dL — AB (ref 6.1–8.1)
Total Bilirubin: 0.3 mg/dL (ref 0.2–1.2)

## 2016-09-08 LAB — TSH: TSH: 2.25 mIU/L

## 2016-09-12 ENCOUNTER — Ambulatory Visit (INDEPENDENT_AMBULATORY_CARE_PROVIDER_SITE_OTHER): Payer: Medicare Other | Admitting: Interventional Cardiology

## 2016-09-12 ENCOUNTER — Encounter: Payer: Self-pay | Admitting: Interventional Cardiology

## 2016-09-12 VITALS — BP 142/60 | HR 75 | Ht 63.0 in | Wt 226.6 lb

## 2016-09-12 DIAGNOSIS — Z5181 Encounter for therapeutic drug level monitoring: Secondary | ICD-10-CM

## 2016-09-12 DIAGNOSIS — I10 Essential (primary) hypertension: Secondary | ICD-10-CM

## 2016-09-12 DIAGNOSIS — I350 Nonrheumatic aortic (valve) stenosis: Secondary | ICD-10-CM | POA: Diagnosis not present

## 2016-09-12 DIAGNOSIS — Z79899 Other long term (current) drug therapy: Secondary | ICD-10-CM

## 2016-09-12 DIAGNOSIS — I480 Paroxysmal atrial fibrillation: Secondary | ICD-10-CM

## 2016-09-12 DIAGNOSIS — G4733 Obstructive sleep apnea (adult) (pediatric): Secondary | ICD-10-CM | POA: Diagnosis not present

## 2016-09-12 DIAGNOSIS — Z7901 Long term (current) use of anticoagulants: Secondary | ICD-10-CM

## 2016-09-12 NOTE — Progress Notes (Signed)
Cardiology Office Note    Date:  09/12/2016   ID:  Doris Jefferson, DOB 1941-03-20, MRN [id number]  PCP:  Stephens Shire, MD  Cardiologist: Sinclair Grooms, MD   Chief Complaint  Patient presents with  . Atrial Fibrillation    History of Present Illness:  Doris Jefferson is a 75 y.o. female who presents for Follow-up of persistent afib s/p hospitalization for tikosyn load.She had significant QT prolongation on 09/08/15 and torsades that required CPR/defibrillation.  Ultimately started on amiodarone and maintaining normal sinus rhythm since  Doing well without symptoms.   Past Medical History:  Diagnosis Date  . Anemia   . Diabetes ([place])   . Heart murmur   . HTN (hypertension)   . Hyperlipidemia   . Obesity (BMI 30-39.9) 03/04/2014  . OSA (obstructive sleep apnea)    severe with AHI 31/hr  . PAF (paroxysmal atrial fibrillation) ([place])     Past Surgical History:  Procedure Laterality Date  . ABDOMINAL HYSTERECTOMY    . CARDIOVERSION N/A 12/19/2013   Procedure: CARDIOVERSION;  Surgeon: Sinclair Grooms, MD;  Location: [facility];  Service: Cardiovascular;  Laterality: N/A;  . CARDIOVERSION N/A 08/16/2015   Procedure: CARDIOVERSION;  Surgeon: Larey Dresser, MD;  Location: [facility];  Service: Cardiovascular;  Laterality: N/A;  . CARDIOVERSION N/A 09/13/2015   Procedure: CARDIOVERSION;  Surgeon: Sueanne Margarita, MD;  [facility];  Service: Cardiovascular;  Laterality: N/A;  . EYE SURGERY    . SPINE SURGERY      Current Medications: Outpatient Medications Prior to Visit  Medication Sig Dispense Refill  . amiodarone (PACERONE) 200 MG tablet Take 1 tablet (200 mg total) by mouth daily. 90 tablet 6  . amiodarone (PACERONE) 200 MG tablet TAKE 1 TABLET(200 MG) BY MOUTH DAILY 30 tablet 6  . benazepril (LOTENSIN) 20 MG tablet Take 20 mg by mouth daily. Pt is taking 10 mg (half a tablet) daily    . buPROPion (WELLBUTRIN XL) 150 MG 24 hr tablet Take 150 mg by mouth  2 (two) times daily.    [name] CARTIA XT 180 MG 24 hr capsule TAKE 1 CAPSULE(180 MG) BY MOUTH TWICE DAILY 60 capsule 6  . doxylamine, Sleep, (UNISOM) 25 MG tablet Take 25 mg by mouth at bedtime as needed for sleep.    [name] ELIQUIS 5 MG TABS tablet TAKE 1 TABLET BY MOUTH TWICE DAILY 60 tablet 6  . furosemide (LASIX) 20 MG tablet Take 1 tablet (20 mg total) by mouth daily. 30 tablet 6  . glimepiride (AMARYL) 4 MG tablet Take 4 mg by mouth daily with breakfast.    . Liraglutide (VICTOZA [place]) Inject 1.8 mg into the skin daily.     [name] MAGNESIUM-OXIDE 400 (241.3 Mg) MG tablet TAKE 1 TABLET(400 MG) BY MOUTH TWICE DAILY 60 tablet 9  . metFORMIN (GLUCOPHAGE) 1000 MG tablet Take 1,000 mg by mouth 2 (two) times daily with a meal.    . metoprolol succinate (TOPROL-XL) 25 MG 24 hr tablet TAKE 1 TABLET(25 MG) BY MOUTH TWICE DAILY 60 tablet 3  . rosuvastatin (CRESTOR) 20 MG tablet Take 20 mg by mouth daily.    [name] terazosin (HYTRIN) 5 MG capsule Take 5 mg by mouth at bedtime.    . furosemide (LASIX) 20 MG tablet TAKE 1 TABLET(20 MG) BY MOUTH DAILY (Patient not taking: Reported on 09/12/2016) 30 tablet 5  . metoprolol succinate (TOPROL-XL) 25 MG 24 hr tablet TAKE 1 TABLET(25 MG) BY MOUTH  TWICE DAILY (Patient not taking: Reported on 09/12/2016) 60 tablet 6   No facility-administered medications prior to visit.      Allergies:   Minocin [minocycline hcl]; Hydrochlorothiazide; and Tape   Social History   Social History  . Marital status: Married    Spouse name: N/A  . Number of children: N/A  . Years of education: N/A   Social History Main Topics  . Smoking status: Never Smoker  . Smokeless tobacco: Never Used  . Alcohol use No  . Drug use: No  . Sexual activity: No   Other Topics Concern  . None   Social History Narrative  . None     Family History:  The patient's family history includes Heart disease in her father; Hypertension in her brother and father; Melanoma in her mother.   ROS:   Please see the  history of present illness.    No complaints. No syncope, orthopnea, PND, dyspnea on exertion, or angina.  All other systems reviewed and are negative.   PHYSICAL EXAM:   VS:  BP (!) 142/60   Pulse 75   Ht 5\' 3"  (1.6 m)   Wt 226 lb 9.6 oz (102.8 kg)   SpO2 98%   BMI 40.14 kg/m    GEN: Well nourished, well developed, in no acute distress . Moderate obesity. HEENT: normal  Neck: no JVD, carotid bruits, or masses Cardiac: RRR; 3/6 crescendo decrescendo systolic murmur of aortic stenosis. An S4  Gallop is present. There is,no edema . Respiratory:  clear to auscultation bilaterally, normal work of breathing GI: soft, nontender, nondistended, + BS MS: no deformity or atrophy  Skin: warm and dry, no rash Neuro:  Alert and Oriented x 3, Strength and sensation are intact Psych: euthymic mood, full affect  Wt Readings from Last 3 Encounters:  09/12/16 226 lb 9.6 oz (102.8 kg)  02/18/16 220 lb 6.4 oz (100 kg)  11/24/15 212 lb (96.2 kg)      Studies/Labs Reviewed:   EKG:  EKG  Sinus rhythm, nonspecific T-wave abnormality, no change compared to prior tracings.  Recent Labs: 09/21/2015: Magnesium 1.6 11/24/2015: B Natriuretic Peptide 122.0; BUN 19; Creatinine, Ser 0.97; Hemoglobin 11.5; Platelets 185; Potassium 4.1; Sodium 141 09/07/2016: ALT 31; TSH 2.25   Lipid Panel No results found for: CHOL, TRIG, HDL, CHOLHDL, VLDL, LDLCALC, LDLDIRECT  Additional studies/ records that were reviewed today include:  Recent liver and thyroid studies were normal. Chest x-ray is unremarkable.    ASSESSMENT:    1. Paroxysmal atrial fibrillation (HCC)   2. Essential hypertension   3. Aortic stenosis, mild   4. Long term current use of amiodarone   5. OSA (obstructive sleep apnea)   6. Anticoagulation goal of INR 2 to 3      PLAN:  In order of problems listed above:  1. No recurrences based upon clinical and historical data. 2. Further well controlled. Low salt diet is advocated. 3. At  least moderate in severity. Plan to repeat echo in one year. 4. Liver and lipid panel were recently checked and were unremarkable. Chest x-ray was unremarkable. 5. C Pap should be worn as recommended. 6. No bleeding complications have occurred.    Medication Adjustments/Labs and Tests Ordered: Current medicines are reviewed at length with the patient today.  Concerns regarding medicines are outlined above.  Medication changes, Labs and Tests ordered today are listed in the Patient Instructions below. Patient Instructions  Medication Instructions:  None  Labwork: TSH and Liver panel  at the same time or just prior to your follow up appointment with Dr. Tamala Julian in 6 months.  Testing/Procedures: None  Follow-Up: Your physician wants you to follow-up in: 6 months with Dr. Tamala Julian.  You will receive a reminder letter in the mail two months in advance. If you don't receive a letter, please call our office to schedule the follow-up appointment.   Any Other Special Instructions Will Be Listed Below (If Applicable).     If you need a refill on your cardiac medications before your next appointment, please call your pharmacy.      Signed, Sinclair Grooms, MD  09/12/2016 10:26 AM    [facility] [place], [place], [place]  [address] Phone: [phone]; Fax: [phone]

## 2016-09-12 NOTE — Patient Instructions (Addendum)
Medication Instructions:  None  Labwork: TSH and Liver panel at the same time or just prior to your follow up appointment with Dr. Tamala Julian in 6 months.  Testing/Procedures: Your physician has requested that you have an echocardiogram in 1 year. Echocardiography is a painless test that uses sound waves to create images of your heart. It provides your doctor with information about the size and shape of your heart and how well your heart's chambers and valves are working. This procedure takes approximately one hour. There are no restrictions for this procedure.    Follow-Up: Your physician wants you to follow-up in: 6 months with Dr. Tamala Julian.  You will receive a reminder letter in the mail two months in advance. If you don't receive a letter, please call our office to schedule the follow-up appointment.   Any Other Special Instructions Will Be Listed Below (If Applicable).     If you need a refill on your cardiac medications before your next appointment, please call your pharmacy.

## 2016-09-25 DIAGNOSIS — I10 Essential (primary) hypertension: Secondary | ICD-10-CM | POA: Insufficient documentation

## 2016-10-16 ENCOUNTER — Other Ambulatory Visit: Payer: Self-pay | Admitting: *Deleted

## 2016-10-16 ENCOUNTER — Telehealth: Payer: Self-pay | Admitting: Cardiology

## 2016-10-17 NOTE — Telephone Encounter (Signed)
Closed encounter °

## 2016-10-18 ENCOUNTER — Encounter: Payer: Self-pay | Admitting: Cardiology

## 2016-10-20 ENCOUNTER — Ambulatory Visit (INDEPENDENT_AMBULATORY_CARE_PROVIDER_SITE_OTHER): Payer: Medicare Other | Admitting: Cardiology

## 2016-10-20 VITALS — BP 158/80 | HR 86 | Ht 63.0 in | Wt 217.2 lb

## 2016-10-20 DIAGNOSIS — I10 Essential (primary) hypertension: Secondary | ICD-10-CM

## 2016-10-20 DIAGNOSIS — G4733 Obstructive sleep apnea (adult) (pediatric): Secondary | ICD-10-CM | POA: Diagnosis not present

## 2016-10-20 DIAGNOSIS — E669 Obesity, unspecified: Secondary | ICD-10-CM

## 2016-10-20 NOTE — Patient Instructions (Signed)

## 2016-10-20 NOTE — Progress Notes (Signed)
Cardiology Office Note    Date:  10/20/2016   ID:  [name], DOB 03-16-1941, MRN [id number]  PCP:  Stephens Shire, MD  Cardiologist:  Fransico Him, MD   Chief Complaint  Patient presents with  . Hypertension  . Sleep Apnea    History of Present Illness:  Doris Jefferson is a 76 y.o. female with a history of PAF, pulmonary HTN, HTN, obesity and OSA on CPAP. She is doing well on her CPAP device. She tolerates her nasal pillow mask and chin strap and feels the pressure is adequate. She says that her mouth gets dry at night but no nasal congestion. She feels rested in the am and has no daytime sleepiness. She does not snore according to her husband.She sleep well at night.   Past Medical History:  Diagnosis Date  . Anemia   . Diabetes ([place])   . Heart murmur   . HTN (hypertension)   . Hyperlipidemia   . Obesity (BMI 30-39.9) 03/04/2014  . OSA (obstructive sleep apnea)    severe with AHI 31/hr  . PAF (paroxysmal atrial fibrillation) ([place])     Past Surgical History:  Procedure Laterality Date  . ABDOMINAL HYSTERECTOMY    . CARDIOVERSION N/A 12/19/2013   Procedure: CARDIOVERSION;  Surgeon: Sinclair Grooms, MD;  Location: [facility] ENDOSCOPY;  Service: Cardiovascular;  Laterality: N/A;  . CARDIOVERSION N/A 08/16/2015   Procedure: CARDIOVERSION;  Surgeon: Larey Dresser, MD;  Location: [place];  Service: Cardiovascular;  Laterality: N/A;  . CARDIOVERSION N/A 09/13/2015   Procedure: CARDIOVERSION;  Surgeon: Sueanne Margarita, MD;  Location: [facility] ENDOSCOPY;  Service: Cardiovascular;  Laterality: N/A;  . EYE SURGERY    . SPINE SURGERY      Current Medications: Outpatient Medications Prior to Visit  Medication Sig Dispense Refill  . amiodarone (PACERONE) 200 MG tablet Take 1 tablet (200 mg total) by mouth daily. 90 tablet 6  . benazepril (LOTENSIN) 20 MG tablet Take 20 mg by mouth daily. Pt is taking 10 mg (half a tablet) daily    . buPROPion (WELLBUTRIN XL) 150 MG 24 hr  tablet Take 150 mg by mouth 2 (two) times daily.    [name] CARTIA XT 180 MG 24 hr capsule TAKE 1 CAPSULE(180 MG) BY MOUTH TWICE DAILY 60 capsule 6  . doxylamine, Sleep, (UNISOM) 25 MG tablet Take 25 mg by mouth at bedtime as needed for sleep.    [name] ELIQUIS 5 MG TABS tablet TAKE 1 TABLET BY MOUTH TWICE DAILY 60 tablet 6  . furosemide (LASIX) 20 MG tablet Take 1 tablet (20 mg total) by mouth daily. 30 tablet 6  . glimepiride (AMARYL) 4 MG tablet Take 4 mg by mouth daily with breakfast.    . Liraglutide (VICTOZA [place]) Inject 1.8 mg into the skin daily.     [name] MAGNESIUM-OXIDE 400 (241.3 Mg) MG tablet TAKE 1 TABLET(400 MG) BY MOUTH TWICE DAILY 60 tablet 9  . metFORMIN (GLUCOPHAGE) 1000 MG tablet Take 1,000 mg by mouth 2 (two) times daily with a meal.    . metoprolol succinate (TOPROL-XL) 25 MG 24 hr tablet TAKE 1 TABLET(25 MG) BY MOUTH TWICE DAILY 60 tablet 3  . rosuvastatin (CRESTOR) 20 MG tablet Take 20 mg by mouth daily.    [name] terazosin (HYTRIN) 5 MG capsule Take 5 mg by mouth at bedtime.    [name] amiodarone (PACERONE) 200 MG tablet TAKE 1 TABLET(200 MG) BY MOUTH DAILY 30 tablet 6   No facility-administered  medications prior to visit.      Allergies:   Minocin [minocycline hcl]; Hydrochlorothiazide; and Tape   Social History   Social History  . Marital status: Married    Spouse name: N/A  . Number of children: N/A  . Years of education: N/A   Social History Main Topics  . Smoking status: Never Smoker  . Smokeless tobacco: Never Used  . Alcohol use No  . Drug use: No  . Sexual activity: No   Other Topics Concern  . Not on file   Social History Narrative  . No narrative on file     Family History:  The patient's family history includes Heart disease in her father; Hypertension in her brother and father; Melanoma in her mother.   ROS:   Please see the history of present illness.    ROS All other systems reviewed and are negative.  No flowsheet data found.     PHYSICAL EXAM:   VS:   BP (!) 158/80   Pulse 86   Ht 5\' 3"  (1.6 m)   Wt 217 lb 3.2 oz (98.5 kg)   SpO2 96% Comment: at rest  BMI 38.48 kg/m    GEN: Well nourished, well developed, in no acute distress  HEENT: normal  Neck: no JVD, carotid bruits, or masses Cardiac: RRR; no murmurs, rubs, or gallops,no edema.  Intact distal pulses bilaterally.  Respiratory:  clear to auscultation bilaterally, normal work of breathing GI: soft, nontender, nondistended, + BS MS: no deformity or atrophy  Skin: warm and dry, no rash Neuro:  Alert and Oriented x 3, Strength and sensation are intact Psych: euthymic mood, full affect  Wt Readings from Last 3 Encounters:  10/20/16 217 lb 3.2 oz (98.5 kg)  09/12/16 226 lb 9.6 oz (102.8 kg)  02/18/16 220 lb 6.4 oz (100 kg)      Studies/Labs Reviewed:   EKG:  EKG is not ordered today.  Recent Labs: 11/24/2015: B Natriuretic Peptide 122.0; BUN 19; Creatinine, Ser 0.97; Hemoglobin 11.5; Platelets 185; Potassium 4.1; Sodium 141 09/07/2016: ALT 31; TSH 2.25   Lipid Panel No results found for: CHOL, TRIG, HDL, CHOLHDL, VLDL, LDLCALC, LDLDIRECT  Additional studies/ records that were reviewed today include:  CPAP download    ASSESSMENT:    1. OSA (obstructive sleep apnea)   2. Essential hypertension   3. Obesity (BMI 30-39.9)      PLAN:  In order of problems listed above:   1.  OSA - the patient is tolerating PAP therapy well without any problems. The PAP download was reviewed today and showed an AHI of 0.4/hr on 14 cm H2O with 100% compliance in using more than 4 hours nightly.  The patient has been using and benefiting from CPAP use and will continue to benefit from therapy.  2.  HTN - BP borderline controlled on current meds.  She says that at home it is 120-130/34mmHg. Continue BB/Hytrin and CCB 3.  Obesity - I have encouraged her to get into a routine exercise program and cut back on carbs and portions.    Medication Adjustments/Labs and Tests Ordered: Current  medicines are reviewed at length with the patient today.  Concerns regarding medicines are outlined above.  Medication changes, Labs and Tests ordered today are listed in the Patient Instructions below.  There are no Patient Instructions on file for this visit.   Signed, Fransico Him, MD  10/20/2016 8:28 AM    [facility]  [address] [place], [place]  [address] Phone: [phone]; Fax: [phone]

## 2016-10-22 ENCOUNTER — Other Ambulatory Visit (HOSPITAL_COMMUNITY): Payer: Self-pay | Admitting: Nurse Practitioner

## 2016-11-13 ENCOUNTER — Other Ambulatory Visit (HOSPITAL_COMMUNITY): Payer: Self-pay | Admitting: Nurse Practitioner

## 2016-11-15 NOTE — Telephone Encounter (Signed)
Pt requested Eliquis 5mg  refill.  Pt 76yrs old, 98.5kg, Crea-0.97 on 11/14/15; pt will need labs prior to next refill. Sent in Eliquis 5mg  as requested to Pharmacy.

## 2017-01-16 ENCOUNTER — Other Ambulatory Visit (HOSPITAL_COMMUNITY): Payer: Self-pay | Admitting: Nurse Practitioner

## 2017-02-09 ENCOUNTER — Other Ambulatory Visit: Payer: Self-pay | Admitting: *Deleted

## 2017-02-09 DIAGNOSIS — Z79899 Other long term (current) drug therapy: Secondary | ICD-10-CM

## 2017-02-14 ENCOUNTER — Other Ambulatory Visit (HOSPITAL_COMMUNITY): Payer: Self-pay | Admitting: Interventional Cardiology

## 2017-02-28 ENCOUNTER — Other Ambulatory Visit (HOSPITAL_COMMUNITY): Payer: Self-pay | Admitting: Interventional Cardiology

## 2017-03-02 ENCOUNTER — Other Ambulatory Visit: Payer: Medicare Other | Admitting: *Deleted

## 2017-03-02 DIAGNOSIS — Z79899 Other long term (current) drug therapy: Secondary | ICD-10-CM

## 2017-03-02 LAB — HEPATIC FUNCTION PANEL
ALBUMIN: 3.7 g/dL (ref 3.5–4.8)
ALT: 34 IU/L — AB (ref 0–32)
AST: 39 IU/L (ref 0–40)
Alkaline Phosphatase: 50 IU/L (ref 39–117)
BILIRUBIN TOTAL: 0.3 mg/dL (ref 0.0–1.2)
Bilirubin, Direct: 0.09 mg/dL (ref 0.00–0.40)
TOTAL PROTEIN: 5.5 g/dL — AB (ref 6.0–8.5)

## 2017-03-02 LAB — TSH: TSH: 2.86 u[IU]/mL (ref 0.450–4.500)

## 2017-03-17 ENCOUNTER — Other Ambulatory Visit: Payer: Self-pay | Admitting: Physician Assistant

## 2017-04-02 NOTE — Progress Notes (Signed)
Cardiology Office Note    Date:  04/04/2017   ID:  Doris Jefferson, DOB 22-Sep-1941, MRN [id number]  PCP:  [name]  Cardiologist: Sinclair Grooms, MD   Chief Complaint  Patient presents with  . Atrial Fibrillation    History of Present Illness:  Doris Jefferson is a 76 y.o. female who presents for follow-up of persistent afib s/p hospitalization for tikosyn load.She had significant QT prolongation on 09/08/15 and torsades that required CPR/defibrillation.  Ultimately started on amiodarone and maintaining normal sinus rhythm Also with asymptomatic aortic stenosis.  Doing well. Denies chest pain. No episodes of syncope or edema formation. She has a dry tickle in her throat with associated cough. This is been going on for years. She denies symptoms palpitations.   Past Medical History:  Diagnosis Date  . Anemia   . Diabetes ([place])   . Heart murmur   . HTN (hypertension)   . Hyperlipidemia   . Obesity (BMI 30-39.9) 03/04/2014  . OSA (obstructive sleep apnea)    severe with AHI 31/hr  . PAF (paroxysmal atrial fibrillation) ([place])     Past Surgical History:  Procedure Laterality Date  . ABDOMINAL HYSTERECTOMY    . CARDIOVERSION N/A 12/19/2013   Procedure: CARDIOVERSION;  Surgeon: Sinclair Grooms, MD;  Location: [facility] ENDOSCOPY;  Service: Cardiovascular;  Laterality: N/A;  . CARDIOVERSION N/A 08/16/2015   Procedure: CARDIOVERSION;  Surgeon: Larey Dresser, MD;  Location: [place];  Service: Cardiovascular;  Laterality: N/A;  . CARDIOVERSION N/A 09/13/2015   Procedure: CARDIOVERSION;  Surgeon: Sueanne Margarita, MD;  Location: [facility] ENDOSCOPY;  Service: Cardiovascular;  Laterality: N/A;  . EYE SURGERY    . SPINE SURGERY      Current Medications: Outpatient Medications Prior to Visit  Medication Sig Dispense Refill  . amiodarone (PACERONE) 200 MG tablet Take 1 tablet (200 mg total) by mouth daily. 90 tablet 6  . buPROPion (WELLBUTRIN XL) 150 MG 24  hr tablet Take 150 mg by mouth 2 (two) times daily.    [name] doxylamine, Sleep, (UNISOM) 25 MG tablet Take 25 mg by mouth at bedtime as needed for sleep.    [name] ELIQUIS 5 MG TABS tablet TAKE 1 TABLET BY MOUTH TWICE DAILY 60 tablet 5  . furosemide (LASIX) 20 MG tablet TAKE 1 TABLET BY MOUTH DAILY 30 tablet 6  . glimepiride (AMARYL) 4 MG tablet Take 4 mg by mouth daily with breakfast.    . Liraglutide (VICTOZA [place]) Inject 1.8 mg into the skin daily.     [name] MAGNESIUM-OXIDE 400 (241.3 Mg) MG tablet TAKE 1 TABLET(400 MG) BY MOUTH TWICE DAILY 60 tablet 5  . metFORMIN (GLUCOPHAGE) 1000 MG tablet Take 1,000 mg by mouth 2 (two) times daily with a meal.    . metoprolol succinate (TOPROL-XL) 25 MG 24 hr tablet TAKE 1 TABLET(25 MG) BY MOUTH TWICE DAILY 60 tablet 9  . rosuvastatin (CRESTOR) 20 MG tablet Take 20 mg by mouth daily.    [name] terazosin (HYTRIN) 5 MG capsule Take 5 mg by mouth at bedtime.    [name] amiodarone (PACERONE) 200 MG tablet TAKE 1 TABLET(200 MG) BY MOUTH DAILY (Patient not taking: Reported on 04/04/2017) 30 tablet 8  . benazepril (LOTENSIN) 20 MG tablet Take 20 mg by mouth daily. Pt is taking 10 mg (half a tablet) daily    . CARTIA XT 180 MG 24 hr capsule TAKE 1 CAPSULE(180 MG) BY MOUTH TWICE DAILY (Patient not taking:  Reported on 04/04/2017) 60 capsule 6   No facility-administered medications prior to visit.      Allergies:   Minocin [minocycline hcl]; Hydrochlorothiazide; and Tape   Social History   Social History  . Marital status: Married    Spouse name: N/A  . Number of children: N/A  . Years of education: N/A   Social History Main Topics  . Smoking status: Never Smoker  . Smokeless tobacco: Never Used  . Alcohol use No  . Drug use: No  . Sexual activity: No   Other Topics Concern  . None   Social History Narrative  . None     Family History:  The patient's family history includes Heart disease in her father; Hypertension in her brother and father; Melanoma in her mother.    ROS:   Please see the history of present illness.    Back discomfort and cough as mentioned earlier. Otherwise asymptomatic. No angina, syncope, or dyspnea  All other systems reviewed and are negative.   PHYSICAL EXAM:   VS:  BP (!) 168/68 (BP Location: Left Arm)   Pulse 69   Ht 5\' 3"  (1.6 m)   Wt 224 lb 1.9 oz (101.7 kg)   BMI 39.70 kg/m    GEN: Well nourished, well developed, in no acute distress  HEENT: normal  Neck: no JVD, or masses. Carotid upstroke is delayed. Bilateral transmission of systolic murmur to both carotids right greater than left. Cardiac: RRR; 3/6 systolic murmur consistent with aortic stenosis. There are no rubs, or gallops, and no edema.  Respiratory:  clear to auscultation bilaterally, normal work of breathing GI: soft, nontender, nondistended, + BS MS: no deformity or atrophy  Skin: warm and dry, no rash Neuro:  Alert and Oriented x 3, Strength and sensation are intact Psych: euthymic mood, full affect  Wt Readings from Last 3 Encounters:  04/04/17 224 lb 1.9 oz (101.7 kg)  10/20/16 217 lb 3.2 oz (98.5 kg)  09/12/16 226 lb 9.6 oz (102.8 kg)      Studies/Labs Reviewed:   EKG:  EKG  none  Recent Labs: 03/02/2017: ALT 34; TSH 2.860   Lipid Panel No results found for: CHOL, TRIG, HDL, CHOLHDL, VLDL, LDLCALC, LDLDIRECT  Additional studies/ records that were reviewed today include:  2 Doppler echocardiogram November 2016:  Study Conclusions  - Left ventricle: The cavity size was normal. Wall thickness was   normal. Systolic function was normal. The estimated ejection   fraction was in the range of 55% to 60%. Wall motion was normal;   there were no regional wall motion abnormalities. Features are   consistent with a pseudonormal left ventricular filling pattern,   with concomitant abnormal relaxation and increased filling   pressure (grade 2 diastolic dysfunction). - Aortic valve: Valve area (VTI): 1.09 cm^2. Valve area (Vmax):   0.99 cm^2.  Valve area (Vmean): 1.07 cm^2. - Left atrium: The atrium was mildly dilated. - Right ventricle: The cavity size was moderately dilated. Systolic   function was moderately reduced. - Right atrium: The atrium was moderately dilated. - Pulmonary arteries: Systolic pressure was moderately increased.   PA peak pressure: 59 mm Hg (S).  ASSESSMENT:    1. Persistent atrial fibrillation ([place])   2. Essential hypertension   3. Aortic stenosis, mild   4. OSA (obstructive sleep apnea)   5. Anticoagulation goal of INR 2 to 3      PLAN:  In order of problems listed above:  1. Atrial fibrillation is  resolved and maintaining normal sinus rhythm on amiodarone based on clinical exam. TSH and hepatic panel were unremarkable in May. Return in 6 months for clinical follow-up with TSH and hepatic panel. We will also consider AP and lateral chest x-ray at that time. 2. Initial blood pressure was elevated but when rechecked 145/60 mmHg. Given progression of aortic stenosis based on clinical exam would not further increase antihypertensive therapy until we have echo confirmation of current level of severity. 3. I believe there is significant clinical progression based upon delayed carotid upstroke. 2-D Doppler echocardiogram prior to the next office visit 4. Continue positive airway pressure when sleeping. 5. Continue Apixiban.  2-D Doppler echocardiogram, TSH, and hepatic panel prior to the next office visit in 6 months.  Medication Adjustments/Labs and Tests Ordered: Current medicines are reviewed at length with the patient today.  Concerns regarding medicines are outlined above.  Medication changes, Labs and Tests ordered today are listed in the Patient Instructions below. There are no Patient Instructions on file for this visit.   Signed, Sinclair Grooms, MD  04/04/2017 9:42 AM    [facility] [place], [place], [place]  [address] Phone: [phone]; Fax: [phone]

## 2017-04-04 ENCOUNTER — Ambulatory Visit (INDEPENDENT_AMBULATORY_CARE_PROVIDER_SITE_OTHER): Payer: Medicare Other | Admitting: Interventional Cardiology

## 2017-04-04 ENCOUNTER — Encounter (INDEPENDENT_AMBULATORY_CARE_PROVIDER_SITE_OTHER): Payer: Self-pay

## 2017-04-04 ENCOUNTER — Encounter: Payer: Self-pay | Admitting: Interventional Cardiology

## 2017-04-04 VITALS — BP 168/68 | HR 69 | Ht 63.0 in | Wt 224.1 lb

## 2017-04-04 DIAGNOSIS — I4819 Other persistent atrial fibrillation: Secondary | ICD-10-CM

## 2017-04-04 DIAGNOSIS — I481 Persistent atrial fibrillation: Secondary | ICD-10-CM | POA: Diagnosis not present

## 2017-04-04 DIAGNOSIS — I10 Essential (primary) hypertension: Secondary | ICD-10-CM

## 2017-04-04 DIAGNOSIS — I350 Nonrheumatic aortic (valve) stenosis: Secondary | ICD-10-CM | POA: Diagnosis not present

## 2017-04-04 DIAGNOSIS — G4733 Obstructive sleep apnea (adult) (pediatric): Secondary | ICD-10-CM | POA: Diagnosis not present

## 2017-04-04 DIAGNOSIS — Z5181 Encounter for therapeutic drug level monitoring: Secondary | ICD-10-CM

## 2017-04-04 DIAGNOSIS — Z7901 Long term (current) use of anticoagulants: Secondary | ICD-10-CM

## 2017-04-04 MED ORDER — LOSARTAN POTASSIUM 50 MG PO TABS: 50.0000 mg | ORAL_TABLET | Freq: Every day | ORAL | 3 refills | Status: DC

## 2017-04-04 NOTE — Patient Instructions (Signed)
Medication Instructions:  1) STOP Benazepril 2) START Losartan 50mg  once daily  Labwork: None  Testing/Procedures: None  Follow-Up: Your physician recommends that you schedule a follow-up appointment in: 1 month with a PA or NP to monitor BP and check on cough.  Your physician wants you to follow-up in: 6 months with Dr. Tamala Julian.  You will receive a reminder letter in the mail two months in advance. If you don't receive a letter, please call our office to schedule the follow-up appointment.   Any Other Special Instructions Will Be Listed Below (If Applicable).     If you need a refill on your cardiac medications before your next appointment, please call your pharmacy.

## 2017-05-07 ENCOUNTER — Ambulatory Visit: Payer: Medicare Other | Admitting: Nurse Practitioner

## 2017-05-18 ENCOUNTER — Other Ambulatory Visit: Payer: Self-pay | Admitting: *Deleted

## 2017-05-18 MED ORDER — APIXABAN 5 MG PO TABS: 5.0000 mg | ORAL_TABLET | Freq: Two times a day (BID) | ORAL | 5 refills | Status: DC

## 2017-05-18 NOTE — Telephone Encounter (Signed)
Request received for Eliquis 5mg ; pt is 76 yrs old, wt-101.7kg, Crea-0.79 via [place] on 03/27/17, last seen by Dr. Tamala Julian on 04/04/17. Will send in refill request to requested Pharmacy.

## 2017-05-23 ENCOUNTER — Encounter (INDEPENDENT_AMBULATORY_CARE_PROVIDER_SITE_OTHER): Payer: Self-pay

## 2017-05-23 ENCOUNTER — Ambulatory Visit (INDEPENDENT_AMBULATORY_CARE_PROVIDER_SITE_OTHER): Payer: Medicare Other | Admitting: Nurse Practitioner

## 2017-05-23 ENCOUNTER — Encounter: Payer: Self-pay | Admitting: Nurse Practitioner

## 2017-05-23 VITALS — BP 150/60 | HR 70 | Ht 63.0 in | Wt 216.6 lb

## 2017-05-23 DIAGNOSIS — Z79899 Other long term (current) drug therapy: Secondary | ICD-10-CM | POA: Diagnosis not present

## 2017-05-23 DIAGNOSIS — I350 Nonrheumatic aortic (valve) stenosis: Secondary | ICD-10-CM | POA: Diagnosis not present

## 2017-05-23 DIAGNOSIS — R05 Cough: Secondary | ICD-10-CM

## 2017-05-23 DIAGNOSIS — I10 Essential (primary) hypertension: Secondary | ICD-10-CM | POA: Diagnosis not present

## 2017-05-23 DIAGNOSIS — I481 Persistent atrial fibrillation: Secondary | ICD-10-CM

## 2017-05-23 DIAGNOSIS — E669 Obesity, unspecified: Secondary | ICD-10-CM | POA: Diagnosis not present

## 2017-05-23 DIAGNOSIS — I4819 Other persistent atrial fibrillation: Secondary | ICD-10-CM

## 2017-05-23 DIAGNOSIS — T464X5A Adverse effect of angiotensin-converting-enzyme inhibitors, initial encounter: Secondary | ICD-10-CM

## 2017-05-23 LAB — BASIC METABOLIC PANEL
BUN/Creatinine Ratio: 18 (ref 12–28)
BUN: 16 mg/dL (ref 8–27)
CO2: 21 mmol/L (ref 20–29)
Calcium: 9.3 mg/dL (ref 8.7–10.3)
Chloride: 104 mmol/L (ref 96–106)
Creatinine, Ser: 0.88 mg/dL (ref 0.57–1.00)
GFR calc Af Amer: 74 mL/min/{1.73_m2} (ref >59)
GFR calc non Af Amer: 64 mL/min/{1.73_m2} (ref >59)
Glucose: 192 mg/dL — ABNORMAL HIGH (ref 65–99)
Potassium: 4.2 mmol/L (ref 3.5–5.2)
Sodium: 143 mmol/L (ref 134–144)

## 2017-05-23 LAB — CBC
Hematocrit: 33.3 % — ABNORMAL LOW (ref 34.0–46.6)
Hemoglobin: 10.7 g/dL — ABNORMAL LOW (ref 11.1–15.9)
MCH: 26 pg — ABNORMAL LOW (ref 26.6–33.0)
MCHC: 32.1 g/dL (ref 31.5–35.7)
MCV: 81 fL (ref 79–97)
Platelets: 208 10*3/uL (ref 150–379)
RBC: 4.12 x10E6/uL (ref 3.77–5.28)
RDW: 15.3 % (ref 12.3–15.4)
WBC: 9 10*3/uL (ref 3.4–10.8)

## 2017-05-23 NOTE — Patient Instructions (Addendum)
We will be checking the following labs today - BMET and CBC   Medication Instructions:    Continue with your current medicines.     Testing/Procedures To Be Arranged:  N/A  Follow-Up:   See Dr. Tamala Julian as planned later this year.      Other Special Instructions:   Keep a check on your blood pressure as you have been doing.     If you need a refill on your cardiac medications before your next appointment, please call your pharmacy.   Call the [place] office at [phone] if you have any questions, problems or concerns.

## 2017-05-23 NOTE — Progress Notes (Signed)
CARDIOLOGY OFFICE NOTE  Date:  05/23/2017    Doris Jefferson Date of Birth: 10/14/1940 Medical Record #[id number]  PCP:  [facility]  Cardiologist:  Doris Jefferson  Chief Complaint  Patient presents with  . Atrial Fibrillation    Work in visit - seen for Dr. Tamala Jefferson    History of Present Illness: Doris Jefferson is a 76 y.o. female who presents today for a work in visit. Seen for Dr. Tamala Jefferson.   She has persistent afib s/p hospitalization for tikosyn load. She had significant QT prolongation on 09/08/15 and torsades that required CPR/defibrillation.Ultimately started on amiodarone and maintaining normal sinus rhythm. Other issues include DM, HTN, AS,  OSA, obesity and HLD.   Seen here back in June - felt to be doing ok.   Comes in today. Here alone. She says she is here because of a med change - went from ACE to ARB - due to cough. This has improved significantly. Bp typically better at home - she monitors regularly.   Past Medical History:  Diagnosis Date  . Anemia   . Diabetes ([place])   . Heart murmur   . HTN (hypertension)   . Hyperlipidemia   . Obesity (BMI 30-39.9) 03/04/2014  . OSA (obstructive sleep apnea)    severe with AHI 31/hr  . PAF (paroxysmal atrial fibrillation) ([place])     Past Surgical History:  Procedure Laterality Date  . ABDOMINAL HYSTERECTOMY    . CARDIOVERSION N/A 12/19/2013   Procedure: CARDIOVERSION;  Surgeon: Sinclair Grooms, MD;  Location: [facility] ENDOSCOPY;  Service: Cardiovascular;  Laterality: N/A;  . CARDIOVERSION N/A 08/16/2015   Procedure: CARDIOVERSION;  Surgeon: Larey Dresser, MD;  Location: [place];  Service: Cardiovascular;  Laterality: N/A;  . CARDIOVERSION N/A 09/13/2015   Procedure: CARDIOVERSION;  Surgeon: Sueanne Margarita, MD;  Location: [facility] ENDOSCOPY;  Service: Cardiovascular;  Laterality: N/A;  . EYE SURGERY    . SPINE SURGERY       Medications: Current Meds  Medication Sig  . amiodarone (PACERONE) 200  MG tablet Take 1 tablet (200 mg total) by mouth daily.  [name] apixaban (ELIQUIS) 5 MG TABS tablet Take 1 tablet (5 mg total) by mouth 2 (two) times daily.  [name] buPROPion (WELLBUTRIN XL) 150 MG 24 hr tablet Take 150 mg by mouth 2 (two) times daily.  [name] diltiazem (CARDIZEM CD) 180 MG 24 hr capsule Take 180 mg by mouth daily.  [name] doxylamine, Sleep, (UNISOM) 25 MG tablet Take 25 mg by mouth at bedtime as needed for sleep.  . furosemide (LASIX) 20 MG tablet TAKE 1 TABLET BY MOUTH DAILY  . glimepiride (AMARYL) 4 MG tablet Take 4 mg by mouth daily with breakfast.  . Liraglutide (VICTOZA [place]) Inject 1.8 mg into the skin daily.   [name] losartan (COZAAR) 50 MG tablet Take 1 tablet (50 mg total) by mouth daily.  [name] MAGNESIUM-OXIDE 400 (241.3 Mg) MG tablet TAKE 1 TABLET(400 MG) BY MOUTH TWICE DAILY  . metFORMIN (GLUCOPHAGE) 1000 MG tablet Take 1,000 mg by mouth 2 (two) times daily with a meal.  . metoprolol succinate (TOPROL-XL) 25 MG 24 hr tablet TAKE 1 TABLET(25 MG) BY MOUTH TWICE DAILY  . rosuvastatin (CRESTOR) 20 MG tablet Take 20 mg by mouth daily.  [name] terazosin (HYTRIN) 5 MG capsule Take 5 mg by mouth at bedtime.  [name] tiZANidine (ZANAFLEX) 4 MG tablet Take one pill twice per day as needed for muscle spasm  . traMADol (  ULTRAM) 50 MG tablet Take 50 mg by mouth every 8 (eight) hours as needed for moderate pain (pinched nerve in back).      Allergies: Allergies  Allergen Reactions  . Minocin [Minocycline Hcl] Swelling and Other (See Comments)    Throat swelling  . Benazepril Cough  . Hydrochlorothiazide Itching  . Tape Rash    Social History: The patient  reports that she has never smoked. She has never used smokeless tobacco. She reports that she does not drink alcohol or use drugs.   Family History: The patient's family history includes Heart disease in her father; Hypertension in her brother and father; Melanoma in her mother.   Review of Systems: Please see the history of present illness.   Otherwise,  the review of systems is positive for none.   All other systems are reviewed and negative.   Physical Exam: VS:  BP (!) 150/60 (BP Location: Left Arm, Patient Position: Sitting, Cuff Size: Large)   Pulse 70   Ht 5\' 3"  (1.6 m)   Wt 216 lb 9.6 oz (98.2 kg)   BMI 38.37 kg/m  .  BMI Body mass index is 38.37 kg/m.  Wt Readings from Last 3 Encounters:  05/23/17 216 lb 9.6 oz (98.2 kg)  04/04/17 224 lb 1.9 oz (101.7 kg)  10/20/16 217 lb 3.2 oz (98.5 kg)    General: Pleasant. Obese. She is alert and in no acute distress.   HEENT: Normal.  Neck: Supple, no JVD, carotid bruits, or masses noted.  Cardiac: Regular rate and rhythm. Harsh outflow murmur. No significant edema.  Respiratory:  Lungs are clear to auscultation bilaterally with normal work of breathing.  GI: Soft and nontender.  MS: No deformity or atrophy. Gait and ROM intact.  Skin: Warm and dry. Color is normal.  Neuro:  Strength and sensation are intact and no gross focal deficits noted.  Psych: Alert, appropriate and with normal affect.   LABORATORY DATA:  EKG:  EKG is ordered today. This demonstrates NSR with ST and T wave changes. Unchanged.   Lab Results  Component Value Date   WBC 10.2 11/24/2015   HGB 11.5 (L) 11/24/2015   HCT 36.5 11/24/2015   PLT 185 11/24/2015   GLUCOSE 247 (H) 11/24/2015   ALT 34 (H) 03/02/2017   AST 39 03/02/2017   NA 141 11/24/2015   K 4.1 11/24/2015   CL 107 11/24/2015   CREATININE 0.97 11/24/2015   BUN 19 11/24/2015   CO2 20 (L) 11/24/2015   TSH 2.860 03/02/2017   INR 1.2 (H) 12/09/2013     BNP (last 3 results) No results for input(s): BNP in the last 8760 hours.  ProBNP (last 3 results) No results for input(s): PROBNP in the last 8760 hours.   Other Studies Reviewed Today:  2 Doppler echocardiogram November 2016:  Study Conclusions  - Left ventricle: The cavity size was normal. Wall thickness was normal. Systolic function was normal. The estimated  ejection fraction was in the range of 55% to 60%. Wall motion was normal; there were no regional wall motion abnormalities. Features are consistent with a pseudonormal left ventricular filling pattern, with concomitant abnormal relaxation and increased filling pressure (grade 2 diastolic dysfunction). - Aortic valve: Valve area (VTI): 1.09 cm^2. Valve area (Vmax): 0.99 cm^2. Valve area (Vmean): 1.07 cm^2. - Left atrium: The atrium was mildly dilated. - Right ventricle: The cavity size was moderately dilated. Systolic function was moderately reduced. - Right atrium: The atrium was moderately dilated. -  Pulmonary arteries: Systolic pressure was moderately increased. PA peak pressure: 59 mm Hg (S).  Assessment/Plan:  1. PAF - on amiodarone - holding in NSR.   2. HTN - stable outpatient control - no changes made today. Now on ARB in place of ACE due to cough - this has resolved. Allergy list updated.   3. AS - for repeat echo later this year. No cardinal symptoms.   4. Chronic anticoagulation - needs CBC and BMET today. No obvious problems noted.   5. HLD - on statin - labs by PCP  6. Obesity   Current medicines are reviewed with the patient today.  The patient does not have concerns regarding medicines other than what has been noted above.  The following changes have been made:  See above.  Labs/ tests ordered today include:    Orders Placed This Encounter  Procedures  . Basic metabolic panel  . CBC  . EKG 12-Lead     Disposition:   FU with Dr. Tamala Jefferson as planned later this year with repeat echo.   Patient is agreeable to this plan and will call if any problems develop in the interim.   SignedTruitt Merle, NP  05/23/2017 8:19 AM  [facility] [address] [place], [place]  [address] Phone: [phone] Fax: [phone]

## 2017-05-24 ENCOUNTER — Telehealth: Payer: Self-pay

## 2017-05-24 DIAGNOSIS — I4819 Other persistent atrial fibrillation: Secondary | ICD-10-CM

## 2017-05-24 NOTE — Telephone Encounter (Signed)
Informed patient of results and verbal understanding expressed.  Patient requests repeat CBC when she comes for ECHO early December. Patient agrees with treatment plan.

## 2017-05-24 NOTE — Telephone Encounter (Signed)
-----   Message from Burtis Junes, NP sent at 05/24/2017  6:55 AM EDT ----- Ok to report. Labs are stable - she is a little anemic - would continue on current regimen. Copy to her PCP. Repeat CBC in 3 months.

## 2017-06-13 IMAGING — CR DG CHEST 2V
2 series · 2 of 2 positions shown · non-contrast
Comparison: 12/12/2007

CLINICAL DATA: Chest tightness and congestion for 1-1/2 weeks.

EXAM:
CHEST  2 VIEW

[PA]
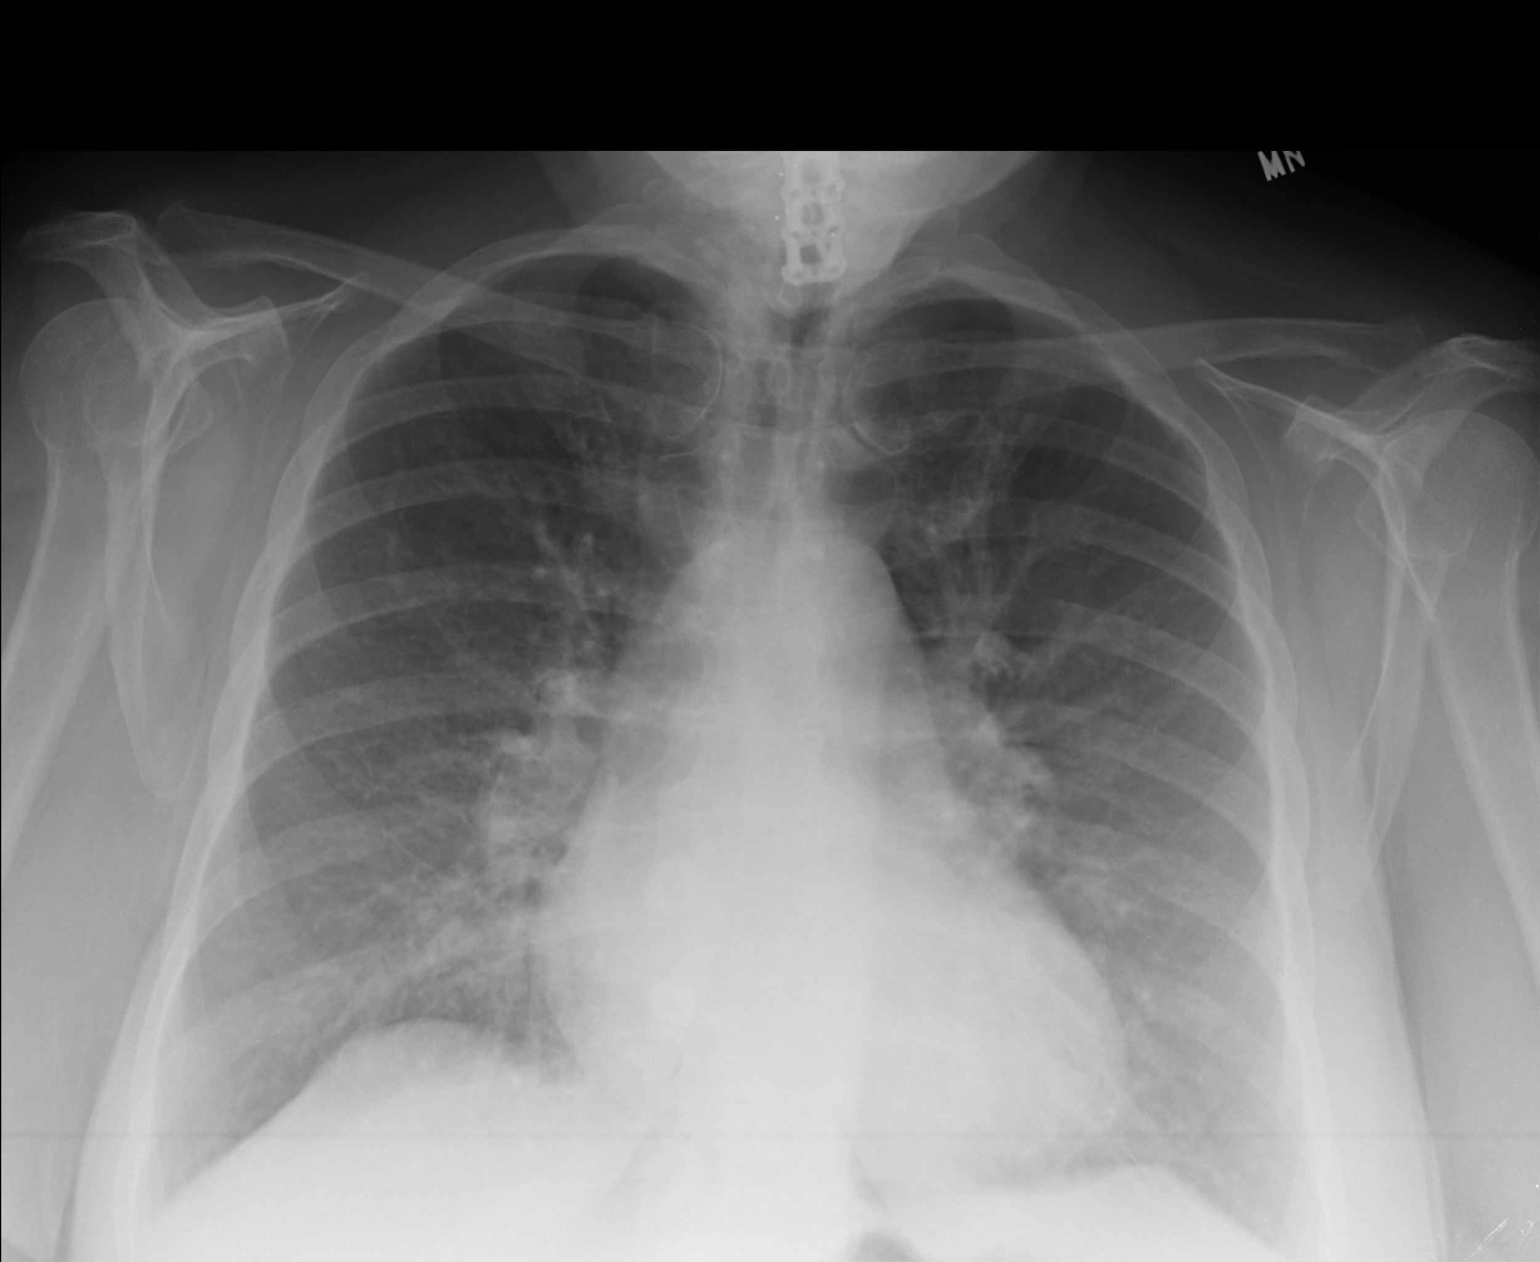

[lateral]
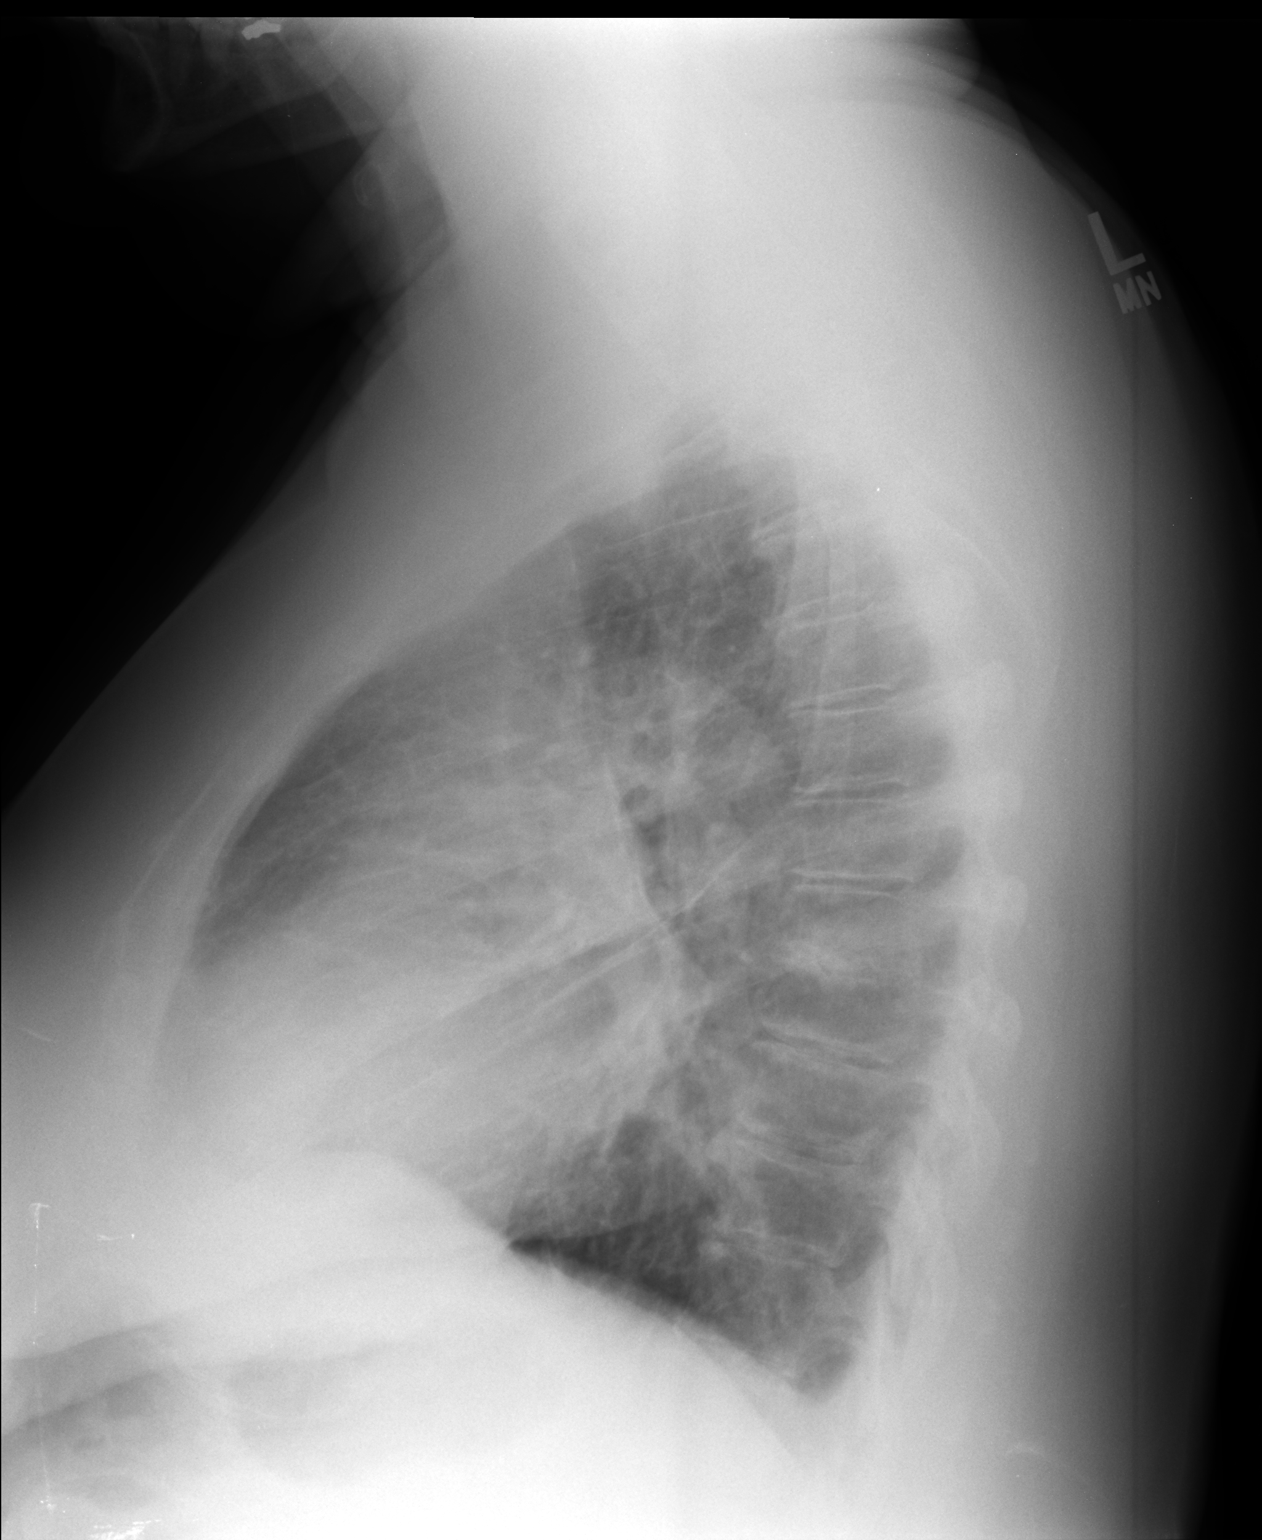

[2 of 2 positions shown; findings below may reference images not displayed]

FINDINGS: Cardiomediastinal silhouette is enlarged. Enlargement of the
diameter of the ascending aorta is also noted.

There is no evidence of focal airspace consolidation, pleural
effusion or pneumothorax. There is mild pulmonary vascular
congestion.

Osseous structures are without acute abnormality. Soft tissues are
grossly normal.
IMPRESSION: Enlargement of the cardiac silhouette and mild pulmonary vascular
congestion.

Enlargement of the diameter of the ascending aorta is also noted. If
vascular pathology is suspected, CT of the chest may be considered.

## 2017-06-13 IMAGING — CR DG CHEST 1V PORT
1 series · 1 of 1 positions shown · non-contrast
Comparison: 10/24/2010

CLINICAL DATA: Shortness of breath over the last 3 days. No chest
pain.

EXAM:
PORTABLE CHEST 1 VIEW

[AP]
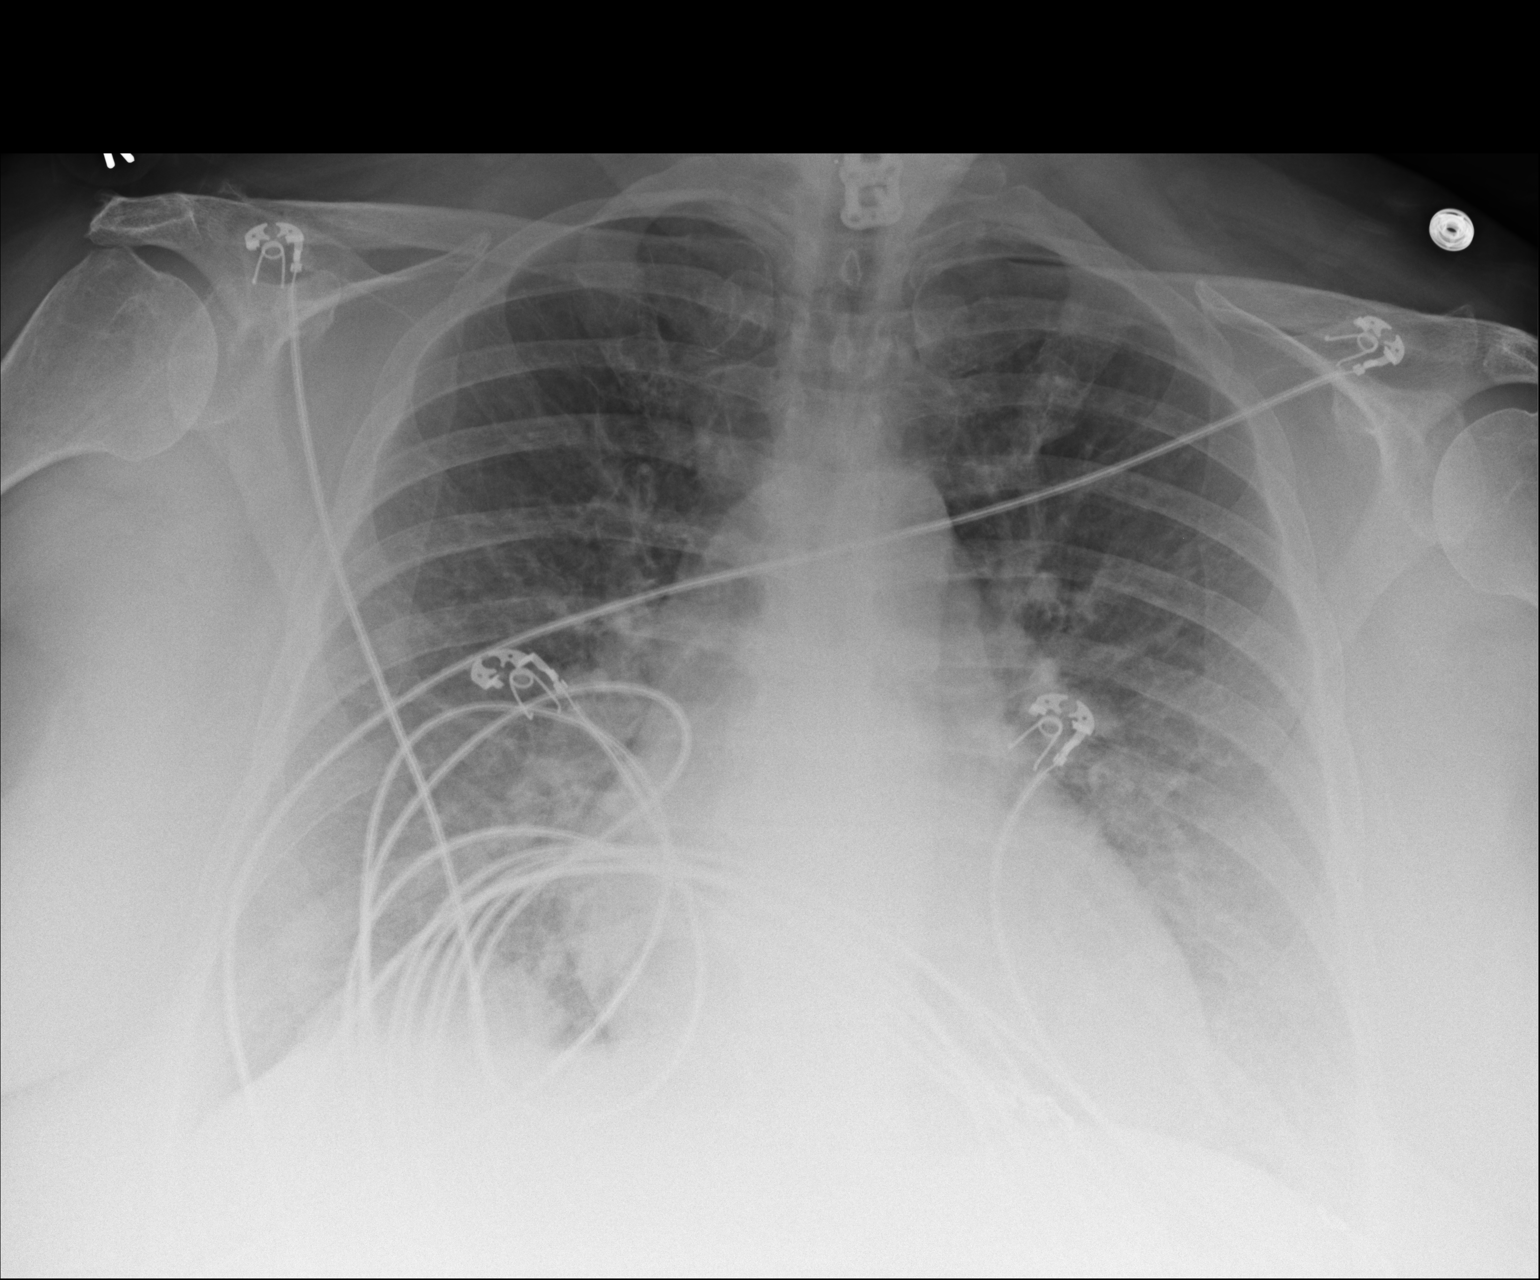

[1 of 1 positions shown; findings below may reference images not displayed]

FINDINGS: The cardiac silhouette is enlarged. The aorta is unfolded. There is
venous hypertension without frank edema. No effusions. No bony
abnormalities.
IMPRESSION: Cardiac enlargement with venous hypertension.

## 2017-09-07 ENCOUNTER — Ambulatory Visit
Admission: RE | Admit: 2017-09-07 | Discharge: 2017-09-07 | Disposition: A | Discharge status: Home / Self Care | Payer: Medicare Other | Source: Ambulatory Visit | Attending: Family Medicine | Admitting: Family Medicine

## 2017-09-07 ENCOUNTER — Other Ambulatory Visit: Payer: Self-pay | Admitting: Family Medicine

## 2017-09-07 DIAGNOSIS — R05 Cough: Secondary | ICD-10-CM

## 2017-09-07 DIAGNOSIS — R0989 Other specified symptoms and signs involving the circulatory and respiratory systems: Secondary | ICD-10-CM

## 2017-09-07 DIAGNOSIS — R059 Cough, unspecified: Secondary | ICD-10-CM

## 2017-09-11 ENCOUNTER — Other Ambulatory Visit: Payer: Self-pay | Admitting: Interventional Cardiology

## 2017-09-17 ENCOUNTER — Other Ambulatory Visit (HOSPITAL_COMMUNITY): Payer: Medicare Other

## 2017-09-17 ENCOUNTER — Other Ambulatory Visit: Payer: Medicare Other

## 2017-09-20 ENCOUNTER — Other Ambulatory Visit: Payer: Self-pay | Admitting: Interventional Cardiology

## 2017-09-20 MED ORDER — METOPROLOL SUCCINATE ER 25 MG PO TB24: ORAL_TABLET | ORAL | 2 refills | Status: DC

## 2017-09-20 NOTE — Telephone Encounter (Signed)
Pt's medication was sent to pt's pharmacy as requested. Confirmation received.  °

## 2017-09-21 ENCOUNTER — Other Ambulatory Visit: Payer: Medicare Other | Admitting: *Deleted

## 2017-09-21 ENCOUNTER — Ambulatory Visit (HOSPITAL_COMMUNITY): Payer: Medicare Other | Attending: Internal Medicine

## 2017-09-21 ENCOUNTER — Other Ambulatory Visit: Payer: Self-pay

## 2017-09-21 DIAGNOSIS — G4733 Obstructive sleep apnea (adult) (pediatric): Secondary | ICD-10-CM | POA: Insufficient documentation

## 2017-09-21 DIAGNOSIS — E785 Hyperlipidemia, unspecified: Secondary | ICD-10-CM | POA: Insufficient documentation

## 2017-09-21 DIAGNOSIS — I480 Paroxysmal atrial fibrillation: Secondary | ICD-10-CM | POA: Diagnosis not present

## 2017-09-21 DIAGNOSIS — D649 Anemia, unspecified: Secondary | ICD-10-CM | POA: Diagnosis not present

## 2017-09-21 DIAGNOSIS — E119 Type 2 diabetes mellitus without complications: Secondary | ICD-10-CM | POA: Insufficient documentation

## 2017-09-21 DIAGNOSIS — I350 Nonrheumatic aortic (valve) stenosis: Secondary | ICD-10-CM | POA: Diagnosis not present

## 2017-09-21 DIAGNOSIS — I10 Essential (primary) hypertension: Secondary | ICD-10-CM | POA: Insufficient documentation

## 2017-09-21 DIAGNOSIS — I4819 Other persistent atrial fibrillation: Secondary | ICD-10-CM

## 2017-09-22 LAB — CBC WITH DIFFERENTIAL/PLATELET
Basophils Absolute: 0 10*3/uL (ref 0.0–0.2)
Basos: 0 %
EOS (ABSOLUTE): 0.2 10*3/uL (ref 0.0–0.4)
Eos: 3 %
Hematocrit: 34.4 % (ref 34.0–46.6)
Hemoglobin: 10.5 g/dL — ABNORMAL LOW (ref 11.1–15.9)
Immature Grans (Abs): 0 10*3/uL (ref 0.0–0.1)
Immature Granulocytes: 0 %
Lymphocytes Absolute: 2 10*3/uL (ref 0.7–3.1)
Lymphs: 21 %
MCH: 25.7 pg — ABNORMAL LOW (ref 26.6–33.0)
MCHC: 30.5 g/dL — ABNORMAL LOW (ref 31.5–35.7)
MCV: 84 fL (ref 79–97)
Monocytes Absolute: 0.7 10*3/uL (ref 0.1–0.9)
Monocytes: 7 %
Neutrophils Absolute: 6.5 10*3/uL (ref 1.4–7.0)
Neutrophils: 69 %
Platelets: 190 10*3/uL (ref 150–379)
RBC: 4.09 x10E6/uL (ref 3.77–5.28)
RDW: 15.8 % — ABNORMAL HIGH (ref 12.3–15.4)
WBC: 9.4 10*3/uL (ref 3.4–10.8)

## 2017-09-26 NOTE — Progress Notes (Signed)
Cardiology Office Note    Date:  09/27/2017   ID:  Doris Jefferson, DOB 22-Aug-1941, MRN [id number]  PCP:  [name]  Cardiologist: Sinclair Grooms, MD   Chief Complaint  Patient presents with  . Atrial Fibrillation  . Cardiac Valve Problem    History of Present Illness:  Doris Jefferson is a 76 y.o. female who presents for follow-up of persistent afib s/p hospitalization for tikosyn load.She had significant QT prolongation on 09/08/15 and torsades that required CPR/defibrillation.  Ultimately started on amiodarone and maintaining normal sinus rhythm Also with asymptomatic aortic stenosis.   She is doing well.  She denies dyspnea, syncope, and chest pain.  She has had no blood in the urine or stool.  No evidence of lower extremity swelling or clinical orthopnea/PND.  Exertional tolerance is been stable.  She is quite happy with quality of life.  She denies transient neurological symptoms.   Past Medical History:  Diagnosis Date  . Anemia   . Diabetes ([place])   . Heart murmur   . HTN (hypertension)   . Hyperlipidemia   . Obesity (BMI 30-39.9) 03/04/2014  . OSA (obstructive sleep apnea)    severe with AHI 31/hr  . PAF (paroxysmal atrial fibrillation) ([place])     Past Surgical History:  Procedure Laterality Date  . ABDOMINAL HYSTERECTOMY    . CARDIOVERSION N/A 12/19/2013   Procedure: CARDIOVERSION;  Surgeon: Sinclair Grooms, MD;  [facility] END[facility]COPY;  Service: Cardiovascular;  Laterality: N/A;  . CARDIOVERSION N/A 08/16/2015   Procedure: CARDIOVERSION;  Surgeon: Larey Dresser, MD;  Location: [place];  Service: Cardiovascular;  Laterality: N/A;  . CARDIOVERSION N/A 09/13/2015   Procedure: CARDIOVERSION;  Surgeon: Sueanne Margarita, MD;  Location: [facility] ENDOSCOPY;  Service: Cardiovascular;  Laterality: N/A;  . EYE SURGERY    . SPINE SURGERY      Current Medications: Outpatient Medications Prior to Visit  Medication Sig Dispense Refill  .  amiodarone (PACERONE) 200 MG tablet Take 1 tablet (200 mg total) by mouth daily. 90 tablet 6  . apixaban (ELIQUIS) 5 MG TABS tablet Take 1 tablet (5 mg total) by mouth 2 (two) times daily. 60 tablet 5  . buPROPion (WELLBUTRIN XL) 150 MG 24 hr tablet Take 150 mg by mouth 2 (two) times daily.    [name] diltiazem (CARDIZEM CD) 180 MG 24 hr capsule Take 180 mg by mouth daily.    [name] doxylamine, Sleep, (UNISOM) 25 MG tablet Take 25 mg by mouth at bedtime as needed for sleep.    . furosemide (LASIX) 20 MG tablet TAKE 1 TABLET BY MOUTH DAILY 30 tablet 6  . glimepiride (AMARYL) 4 MG tablet Take 4 mg by mouth daily with breakfast.    . Liraglutide (VICTOZA Mertens) Inject 1.8 mg into the skin daily.     [name] MAGNESIUM-OXIDE 400 (241.3 Mg) MG tablet TAKE 1 TABLET BY MOUTH TWICE DAILY 60 tablet 8  . metFORMIN (GLUCOPHAGE) 1000 MG tablet Take 1,000 mg by mouth 2 (two) times daily with a meal.    . metoprolol succinate (TOPROL-XL) 25 MG 24 hr tablet TAKE 1 TABLET(25 MG) BY MOUTH TWICE DAILY 180 tablet 2  . rosuvastatin (CRESTOR) 20 MG tablet Take 20 mg by mouth daily.    [name] terazosin (HYTRIN) 5 MG capsule Take 5 mg by mouth at bedtime.    [name] losartan (COZAAR) 50 MG tablet Take 1 tablet (50 mg total) by mouth daily. 90 tablet  3  . tiZANidine (ZANAFLEX) 4 MG tablet Take one pill twice per day as needed for muscle spasm    . traMADol (ULTRAM) 50 MG tablet Take 50 mg by mouth every 8 (eight) hours as needed for moderate pain (pinched nerve in back).      No facility-administered medications prior to visit.      Allergies:   Minocin [minocycline hcl]; Benazepril; Hydrochlorothiazide; and Tape   Social History   Socioeconomic History  . Marital status: Married    Spouse name: None  . Number of children: None  . Years of education: None  . Highest education level: None  Social Needs  . Financial resource strain: None  . Food insecurity - worry: None  . Food insecurity - inability: None  . Transportation needs -  medical: None  . Transportation needs - non-medical: None  Occupational History  . None  Tobacco Use  . Smoking status: Never Smoker  . Smokeless tobacco: Never Used  Substance and Sexual Activity  . Alcohol use: No  . Drug use: No  . Sexual activity: No  Other Topics Concern  . None  Social History Narrative  . None     Family History:  The patient's family history includes Heart disease in her father; Hypertension in her brother and father; Melanoma in her mother.   ROS:   Please see the history of present illness.    Cough, hearing loss, vision disturbance. All other systems reviewed and are negative.   PHYSICAL EXAM:   VS:  BP 132/64   Pulse 74   Ht 5\' 3"  (1.6 m)   Wt 222 lb (100.7 kg)   SpO2 99%   BMI 39.33 kg/m    GEN: Well nourished, well developed, in no acute distress  HEENT: normal  Neck: no JVD, carotid bruits, or masses Cardiac: RRR; 2/6 systolic  murmur, with no rubs, or gallops,no edema. Respiratory:  clear to auscultation bilaterally, normal work of breathing GI: soft, nontender, nondistended, + BS MS: no deformity or atrophy  Skin: warm and dry, no rash Neuro:  Alert and Oriented x 3, Strength and sensation are intact Psych: euthymic mood, full affect  Wt Readings from Last 3 Encounters:  09/27/17 222 lb (100.7 kg)  05/23/17 216 lb 9.6 oz (98.2 kg)  04/04/17 224 lb 1.9 oz (101.7 kg)      Studies/Labs Reviewed:   EKG:  EKG  Not performed.  Recent Labs: 03/02/2017: ALT 34; TSH 2.860 05/23/2017: BUN 16; Creatinine, Ser 0.88; Potassium 4.2; Sodium 143 09/21/2017: Hemoglobin 10.5; Platelets 190   Lipid Panel No results found for: CHOL, TRIG, HDL, CHOLHDL, VLDL, LDLCALC, LDLDIRECT  Additional studies/ records that were reviewed today include:   2D Doppler echocardiogram 09/21/2017:  Study Conclusions   - Left ventricle: The cavity size was normal. Wall thickness was   increased in a pattern of mild LVH. Systolic function was normal.   The  estimated ejection fraction was in the range of 55% to 60%.   Wall motion was normal; there were no regional wall motion   abnormalities. Features are consistent with a pseudonormal left   ventricular filling pattern, with concomitant abnormal relaxation   and increased filling pressure (grade 2 diastolic dysfunction). - Aortic valve: Moderately calcified annulus. Moderately thickened,   moderately calcified leaflets. 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 - Left atrium: The atrium was mildly dilated. - Pulmonary arteries: Systolic pressure was moderately increased.   PA peak pressure: 60 mm Hg (S).  ASSESSMENT:    1. Moderate aortic stenosis   2. Persistent atrial fibrillation ([place])   3. Essential hypertension   4. OSA (obstructive sleep apnea)   5. Anticoagulation goal of INR 2 to 3   6. Long term current use of amiodarone      PLAN:  In order of problems listed above:  1. Moderate stenosis based on recent echocardiographic study.  Will update problem list. 2. Resolved normal sinus rhythm trolled on amiodarone therapy. 3. Excellent blood pressure control. 4. Continue CPAP. 5. Continue apixaban.  Monitor for evidence of bleeding in stool, urine, skin. 6. TSH and liver function test today to monitor amiodarone.  Return in 6 months for follow-up.  Repeat blood work at that time and consider chest x-ray.    Medication Adjustments/Labs and Tests Ordered: Current medicines are reviewed at length with the patient today.  Concerns regarding medicines are outlined above.  Medication changes, Labs and Tests ordered today are listed in the Patient Instructions below. Patient Instructions  Medication Instructions:  Your physician recommends that you continue on your current medications as directed. Please refer to the Current Medication list given to you today.  Labwork: TSH and liver today  Testing/Procedures: None  Follow-Up: Your physician wants you to follow-up in: 6 months with  Dr. Tamala Julian.  You will receive a reminder letter in the mail two months in advance. If you don't receive a letter, please call our office to schedule the follow-up appointment.   Any Other Special Instructions Will Be Listed Below (If Applicable).     If you need a refill on your cardiac medications before your next appointment, please call your pharmacy.      Signed, Sinclair Grooms, MD  09/27/2017 8:38 AM    [address], [place], [place]  [address] Phone: [phone]; Fax: [phone]

## 2017-09-27 ENCOUNTER — Ambulatory Visit (INDEPENDENT_AMBULATORY_CARE_PROVIDER_SITE_OTHER): Payer: Medicare Other | Admitting: Interventional Cardiology

## 2017-09-27 ENCOUNTER — Encounter: Payer: Self-pay | Admitting: Interventional Cardiology

## 2017-09-27 VITALS — BP 132/64 | HR 74 | Ht 63.0 in | Wt 222.0 lb

## 2017-09-27 DIAGNOSIS — I10 Essential (primary) hypertension: Secondary | ICD-10-CM | POA: Diagnosis not present

## 2017-09-27 DIAGNOSIS — I350 Nonrheumatic aortic (valve) stenosis: Secondary | ICD-10-CM | POA: Diagnosis not present

## 2017-09-27 DIAGNOSIS — Z79899 Other long term (current) drug therapy: Secondary | ICD-10-CM | POA: Diagnosis not present

## 2017-09-27 DIAGNOSIS — G4733 Obstructive sleep apnea (adult) (pediatric): Secondary | ICD-10-CM | POA: Diagnosis not present

## 2017-09-27 DIAGNOSIS — Z7901 Long term (current) use of anticoagulants: Secondary | ICD-10-CM

## 2017-09-27 DIAGNOSIS — Z5181 Encounter for therapeutic drug level monitoring: Secondary | ICD-10-CM | POA: Diagnosis not present

## 2017-09-27 DIAGNOSIS — I481 Persistent atrial fibrillation: Secondary | ICD-10-CM

## 2017-09-27 DIAGNOSIS — I4819 Other persistent atrial fibrillation: Secondary | ICD-10-CM

## 2017-09-27 LAB — HEPATIC FUNCTION PANEL
ALBUMIN: 3.9 g/dL (ref 3.5–4.8)
ALK PHOS: 71 IU/L (ref 39–117)
ALT: 53 IU/L — ABNORMAL HIGH (ref 0–32)
AST: 69 IU/L — ABNORMAL HIGH (ref 0–40)
BILIRUBIN TOTAL: 0.2 mg/dL (ref 0.0–1.2)
BILIRUBIN, DIRECT: 0.1 mg/dL (ref 0.00–0.40)
TOTAL PROTEIN: 5.9 g/dL — AB (ref 6.0–8.5)

## 2017-09-27 LAB — TSH: TSH: 3.15 u[IU]/mL (ref 0.450–4.500)

## 2017-09-27 NOTE — Patient Instructions (Signed)
Medication Instructions:  Your physician recommends that you continue on your current medications as directed. Please refer to the Current Medication list given to you today.  Labwork: TSH and liver today  Testing/Procedures: None  Follow-Up: Your physician wants you to follow-up in: 6 months with Dr. Smith.  You will receive a reminder letter in the mail two months in advance. If you don't receive a letter, please call our office to schedule the follow-up appointment.   Any Other Special Instructions Will Be Listed Below (If Applicable).     If you need a refill on your cardiac medications before your next appointment, please call your pharmacy.   

## 2017-10-01 ENCOUNTER — Telehealth: Payer: Self-pay | Admitting: *Deleted

## 2017-10-01 DIAGNOSIS — I10 Essential (primary) hypertension: Secondary | ICD-10-CM

## 2017-10-01 NOTE — Telephone Encounter (Signed)
Pt has been notified of lab results. Pt is agreeable to plan of care and repeat labs in 6 months 04/01/18. Will fax copy of labs to PCP. Pt thanked me for my call.

## 2017-10-01 NOTE — Telephone Encounter (Signed)
-----   Message from Belva Crome, MD sent at 09/29/2017  9:16 AM EST ----- Let the patient know labs are stable compared to historical. Repeat in 6 months. A copy will be sent to [place], [place]

## 2017-10-15 ENCOUNTER — Telehealth: Payer: Self-pay | Admitting: *Deleted

## 2017-10-15 NOTE — Telephone Encounter (Signed)
   [facility] Pre-operative Risk Assessment    Request for surgical clearance:  1. What type of surgery is being performed? colonoscopy for iron deficiency anemia  2. When is this surgery scheduled? 11/12/17   3. Are there any medications that need to be held prior to surgery and how long? Apixaban. See below 4.    5. Practice name and name of physician performing surgery? [facility]. [facility]  6. What is your office phone and fax number?  Ph: [phone], Fax: [phone]   7. Anesthesia type (None, local, MAC, general) no information  Can this patient temporarily stop the above medication before her procedure, or after the procedure if polyps are removed.   Stop date:  Restart date (if polyps removed):  Rodman Key 10/15/2017, 3:07 PM  _________________________________________________________________   (provider comments below)

## 2017-10-15 NOTE — Telephone Encounter (Signed)
   Primary Cardiologist: Sinclair Grooms, MD  Chart reviewed as part of pre-operative protocol coverage. Doris Jefferson was last seen on 09/27/17 by Dr. Tamala Julian. She has history of persistent atrial fib s/p hospitalization for Tikosyn, QT prolongation and torsades requiring CPR/defibrillation, moderate aortic stenosis, elevated PA pressure, obesity, OSA, HTN, hyperlipidemia, anemia. Do not see prior history of coronary disease. Seen by Dr. Tamala Julian recently with 2D echo 09/21/17 showed mild LVH, EF 29-24%, grade 2 diastolic dysfunction, moderate aortic stenosis, and moderately increased PA systolic pressure of 46KMMN. She was doing well at their recent visit with recommendation to observe for any progressive symptoms.  Given recent echo findings including progressive AS and pulmonary HTN, will reach out to Dr. Tamala Julian get his thoughts on clearance for the patient.  Dr. Tamala Julian, please route reply to P CV DIV PREOP. Will also reach out to pharmacy for input on apixaban.  Charlie Pitter, PA-C 10/15/2017, 3:54 PM

## 2017-10-15 NOTE — Telephone Encounter (Signed)
Cleared for the upcoming colonoscopy.Would also recommend SBE prophy.

## 2017-10-16 ENCOUNTER — Other Ambulatory Visit (HOSPITAL_COMMUNITY): Payer: Self-pay | Admitting: Interventional Cardiology

## 2017-10-16 ENCOUNTER — Other Ambulatory Visit (HOSPITAL_COMMUNITY): Payer: Self-pay | Admitting: Internal Medicine

## 2017-10-16 NOTE — Telephone Encounter (Signed)
Faxed clearance to requesting doctor.

## 2017-10-16 NOTE — Telephone Encounter (Signed)
Patient with diagnosis of Afib on Eliquis for anticoagulation.    Procedure: colonoscopy Date of procedure: 11/12/17  CHADS2-VASc score of  6 (CHF, HTN, AGE, DM2, stroke/tia x 2, CAD, AGE, female)  CrCl 7ml/min  Per office protocol, patient can hold Eliquis for 2 days prior to procedure.    Patient should restart Eliquis on the evening of procedure or day after, at discretion of procedure MD.   For SBE prophylaxis would recommend amoxicillin 2g 30-60 minutes before procedure.

## 2017-10-23 ENCOUNTER — Other Ambulatory Visit: Payer: Self-pay | Admitting: Gastroenterology

## 2017-10-29 ENCOUNTER — Encounter (HOSPITAL_COMMUNITY): Payer: Self-pay | Admitting: Emergency Medicine

## 2017-10-29 ENCOUNTER — Other Ambulatory Visit: Payer: Self-pay

## 2017-11-09 ENCOUNTER — Other Ambulatory Visit: Payer: Self-pay | Admitting: Gastroenterology

## 2017-11-12 ENCOUNTER — Other Ambulatory Visit: Payer: Self-pay | Admitting: Gastroenterology

## 2017-11-13 ENCOUNTER — Ambulatory Visit (HOSPITAL_COMMUNITY): Payer: Medicare Other | Admitting: Anesthesiology

## 2017-11-13 ENCOUNTER — Other Ambulatory Visit: Payer: Self-pay

## 2017-11-13 ENCOUNTER — Encounter (HOSPITAL_COMMUNITY): Payer: Self-pay | Admitting: Emergency Medicine

## 2017-11-13 ENCOUNTER — Ambulatory Visit (HOSPITAL_COMMUNITY)
Admission: RE | Admit: 2017-11-13 | Discharge: 2017-11-13 | Disposition: A | Discharge status: Home / Self Care | Payer: Medicare Other | Source: Ambulatory Visit | Attending: Gastroenterology | Admitting: Gastroenterology

## 2017-11-13 ENCOUNTER — Encounter (HOSPITAL_COMMUNITY)
Admission: RE | Disposition: A | Discharge status: Home / Self Care | Payer: Self-pay | Source: Ambulatory Visit | Attending: Gastroenterology

## 2017-11-13 DIAGNOSIS — K5730 Diverticulosis of large intestine without perforation or abscess without bleeding: Secondary | ICD-10-CM | POA: Insufficient documentation

## 2017-11-13 DIAGNOSIS — I10 Essential (primary) hypertension: Secondary | ICD-10-CM | POA: Insufficient documentation

## 2017-11-13 DIAGNOSIS — Z888 Allergy status to other drugs, medicaments and biological substances status: Secondary | ICD-10-CM | POA: Insufficient documentation

## 2017-11-13 DIAGNOSIS — G4733 Obstructive sleep apnea (adult) (pediatric): Secondary | ICD-10-CM | POA: Diagnosis not present

## 2017-11-13 DIAGNOSIS — R011 Cardiac murmur, unspecified: Secondary | ICD-10-CM | POA: Diagnosis not present

## 2017-11-13 DIAGNOSIS — E119 Type 2 diabetes mellitus without complications: Secondary | ICD-10-CM | POA: Insufficient documentation

## 2017-11-13 DIAGNOSIS — D125 Benign neoplasm of sigmoid colon: Secondary | ICD-10-CM | POA: Insufficient documentation

## 2017-11-13 DIAGNOSIS — D509 Iron deficiency anemia, unspecified: Secondary | ICD-10-CM | POA: Insufficient documentation

## 2017-11-13 DIAGNOSIS — E7800 Pure hypercholesterolemia, unspecified: Secondary | ICD-10-CM | POA: Diagnosis not present

## 2017-11-13 DIAGNOSIS — Z7901 Long term (current) use of anticoagulants: Secondary | ICD-10-CM | POA: Diagnosis not present

## 2017-11-13 DIAGNOSIS — K640 First degree hemorrhoids: Secondary | ICD-10-CM | POA: Insufficient documentation

## 2017-11-13 DIAGNOSIS — F329 Major depressive disorder, single episode, unspecified: Secondary | ICD-10-CM | POA: Diagnosis not present

## 2017-11-13 DIAGNOSIS — I4891 Unspecified atrial fibrillation: Secondary | ICD-10-CM | POA: Diagnosis not present

## 2017-11-13 DIAGNOSIS — K3189 Other diseases of stomach and duodenum: Secondary | ICD-10-CM | POA: Diagnosis not present

## 2017-11-13 DIAGNOSIS — Z7984 Long term (current) use of oral hypoglycemic drugs: Secondary | ICD-10-CM | POA: Diagnosis not present

## 2017-11-13 DIAGNOSIS — Z79899 Other long term (current) drug therapy: Secondary | ICD-10-CM | POA: Insufficient documentation

## 2017-11-13 DIAGNOSIS — K219 Gastro-esophageal reflux disease without esophagitis: Secondary | ICD-10-CM | POA: Insufficient documentation

## 2017-11-13 DIAGNOSIS — Z6839 Body mass index (BMI) 39.0-39.9, adult: Secondary | ICD-10-CM | POA: Insufficient documentation

## 2017-11-13 DIAGNOSIS — I350 Nonrheumatic aortic (valve) stenosis: Secondary | ICD-10-CM | POA: Insufficient documentation

## 2017-11-13 HISTORY — PX: ESOPHAGOGASTRODUODENOSCOPY (EGD) WITH PROPOFOL: SHX5813

## 2017-11-13 HISTORY — PX: COLONOSCOPY WITH PROPOFOL: SHX5780

## 2017-11-13 LAB — GLUCOSE, CAPILLARY: Glucose-Capillary: 179 mg/dL — ABNORMAL HIGH (ref 65–99)

## 2017-11-13 SURGERY — COLONOSCOPY WITH PROPOFOL
Anesthesia: Monitor Anesthesia Care

## 2017-11-13 MED ORDER — ONDANSETRON HCL 4 MG/2ML IJ SOLN
INTRAMUSCULAR | Status: DC | PRN
  Administered 2017-11-13: 4 mg via INTRAVENOUS

## 2017-11-13 MED ORDER — LACTATED RINGERS IV SOLN
INTRAVENOUS | Status: DC
  Administered 2017-11-13 (×2): via INTRAVENOUS

## 2017-11-13 MED ORDER — PROPOFOL 10 MG/ML IV BOLUS
INTRAVENOUS | Status: AC
  Filled 2017-11-13: qty 20

## 2017-11-13 MED ORDER — SODIUM CHLORIDE 0.9 % IV SOLN: INTRAVENOUS | Status: DC

## 2017-11-13 MED ORDER — PROPOFOL 10 MG/ML IV BOLUS
INTRAVENOUS | Status: AC
  Filled 2017-11-13: qty 40

## 2017-11-13 MED ORDER — SODIUM CHLORIDE 0.9 % IV SOLN
2.0000 g | Freq: Once | INTRAVENOUS | Status: AC
  Administered 2017-11-13: 2 g via INTRAVENOUS
  Filled 2017-11-13: qty 2000

## 2017-11-13 MED ORDER — PROPOFOL 10 MG/ML IV BOLUS
INTRAVENOUS | Status: DC | PRN
  Administered 2017-11-13 (×2): 10 mg via INTRAVENOUS
  Administered 2017-11-13: 30 mg via INTRAVENOUS

## 2017-11-13 MED ORDER — PROPOFOL 500 MG/50ML IV EMUL
INTRAVENOUS | Status: DC | PRN
  Administered 2017-11-13: 200 ug/kg/min via INTRAVENOUS

## 2017-11-13 MED ORDER — LIDOCAINE 2% (20 MG/ML) 5 ML SYRINGE
INTRAMUSCULAR | Status: DC | PRN
  Administered 2017-11-13: 60 mg via INTRAVENOUS

## 2017-11-13 SURGICAL SUPPLY — 24 items

## 2017-11-13 NOTE — Anesthesia Postprocedure Evaluation (Signed)
Anesthesia Post Note  Patient: Doris Jefferson  Procedure(s) Performed: COLONOSCOPY WITH PROPOFOL (N/A ) ESOPHAGOGASTRODUODENOSCOPY (EGD) WITH PROPOFOL (N/A )     Patient location during evaluation: Endoscopy Anesthesia Type: MAC Level of consciousness: awake and sedated Pain management: pain level controlled Vital Signs Assessment: post-procedure vital signs reviewed and stable Respiratory status: spontaneous breathing Cardiovascular status: stable Postop Assessment: no apparent nausea or vomiting Anesthetic complications: no    Last Vitals:  Vitals:   11/13/17 1020 11/13/17 1030  BP: (!) 154/57   Pulse: 68 70  Resp: (!) 21 19  Temp:    SpO2: 97% 97%    Last Pain:  Vitals:   11/13/17 1019  TempSrc: Oral   Pain Goal:                 [name],[name]

## 2017-11-13 NOTE — Discharge Instructions (Signed)

## 2017-11-13 NOTE — Op Note (Signed)
[facility] Patient Name: Doris Jefferson Procedure Date: 11/13/2017 MRN: [id number] Attending MD: Lear Ng , MD Date of Birth: 1941/05/27 CSN: [id number] Age: 77 Admit Type: Outpatient Procedure:                Colonoscopy Indications:              Last colonoscopy: date unknown, Iron deficiency                            anemia Providers:                Lear Ng, MD, Elmer Ramp. Tilden Dome, RN,                            Nevin Bloodgood, Technician, Virgia Land, CRNA Referring MD:              Medicines:                Propofol per Anesthesia, Monitored Anesthesia Care,                            Ampicillin 2 g IV Complications:            Scope trauma Estimated Blood Loss:     Estimated blood loss was minimal. Procedure:                Pre-Anesthesia Assessment:                           - Prior to the procedure, a History and Physical                            was performed, and patient medications and                            allergies were reviewed. The patient's tolerance of                            previous anesthesia was also reviewed. The risks                            and benefits of the procedure and the sedation                            options and risks were discussed with the patient.                            All questions were answered, and informed consent                            was obtained. Prior Anticoagulants: The patient has                            taken Eliquis (apixaban), last dose was 3 days  prior to procedure. ASA Grade Assessment: III - A                            patient with severe systemic disease. After                            reviewing the risks and benefits, the patient was                            deemed in satisfactory condition to undergo the                            procedure.                           After obtaining informed consent, the colonoscope           was passed under direct vision. Throughout the                            procedure, the patient's blood pressure, pulse, and                            oxygen saturations were monitored continuously. The                            EC-[id number] ([id number]) scope was introduced through                            the anus and advanced to the the cecum, identified                            by appendiceal orifice and ileocecal valve. The                            colonoscopy was extremely difficult due to                            significant looping and a tortuous colon.                            Successful completion of the procedure was aided by                            changing the patient to a supine position,                            straightening and shortening the scope to obtain                            bowel loop reduction and applying abdominal                            pressure. The patient tolerated the procedure well.  The quality of the bowel preparation was good                            except the cecum was fair. The ileocecal valve,                            appendiceal orifice, and rectum were photographed. Scope In: 9:32:00 AM Scope Out: 10:10:27 AM Scope Withdrawal Time: 0 hours 10 minutes 49 seconds  Total Procedure Duration: 0 hours 38 minutes 27 seconds  Findings:      The perianal and digital rectal examinations were normal.      A 4 mm polyp was found in the ascending colon. The polyp was sessile.       The polyp was removed with a hot snare. Resection was complete, but the       polyp tissue was not retrieved. Estimated blood loss: none.      Scattered small and large-mouthed diverticula were found in the sigmoid       colon.      A 8 mm polyp was found in the sigmoid colon. The polyp was sessile. The       polyp was removed with a hot snare. Resection and retrieval were       complete. Estimated blood loss: none.       Internal hemorrhoids were found during retroflexion. The hemorrhoids       were medium-sized and Grade I (internal hemorrhoids that do not       prolapse). Impression:               - One 4 mm polyp in the ascending colon, removed                            with a hot snare. Complete resection. Polyp tissue                            not retrieved.                           - Diverticulosis in the sigmoid colon.                           - One 8 mm polyp in the sigmoid colon, removed with                            a hot snare. Resected and retrieved.                           - Internal hemorrhoids. Moderate Sedation:      N/A- Per Anesthesia Care Recommendation:           - Await pathology results.                           - No aspirin, ibuprofen, naproxen, or other                            non-steroidal anti-inflammatory drugs for 2 weeks.                           -  Resume Eliquis (apixaban) at prior dose tomorrow.                            Refer to managing physician for further adjustment                            of therapy.                           - Repeat colonoscopy for surveillance based on                            pathology results.                           - Patient has a contact number available for                            emergencies. The signs and symptoms of potential                            delayed complications were discussed with the                            patient. Return to normal activities tomorrow.                            Written discharge instructions were provided to the                            patient.                           - High fiber diet. Procedure Code(s):        --- Professional ---                           [phone], Colonoscopy, flexible; with removal of                            tumor(s), polyp(s), or other lesion(s) by snare                            technique Diagnosis Code(s):        --- Professional ---                            D50.9, Iron deficiency anemia, unspecified                           D12.2, Benign neoplasm of ascending colon                           D12.5, Benign neoplasm of sigmoid colon                           K64.0, First  degree hemorrhoids                           K57.30, Diverticulosis of large intestine without                            perforation or abscess without bleeding CPT copyright 2016 American Medical Association. All rights reserved. The codes documented in this report are preliminary and upon coder review may  be revised to meet current compliance requirements. Lear Ng, MD 11/13/2017 10:26:15 AM This report has been signed electronically. Number of Addenda: 0

## 2017-11-13 NOTE — Interval H&P Note (Signed)
History and Physical Interval Note:  11/13/2017 9:07 AM  Doris Jefferson  has presented today for surgery, with the diagnosis of anemia  The various methods of treatment have been discussed with the patient and family. After consideration of risks, benefits and other options for treatment, the patient has consented to  Procedure(s) with comments: COLONOSCOPY WITH PROPOFOL (N/A) - ANTIBIOTIC NEEDED BEFORE PROCEDURE ESOPHAGOGASTRODUODENOSCOPY (EGD) WITH PROPOFOL (N/A) as a surgical intervention .  The patient's history has been reviewed, patient examined, no change in status, stable for surgery.  I have reviewed the patient's chart and labs.  Questions were answered to the patient's satisfaction.     [place] [facility].

## 2017-11-13 NOTE — Transfer of Care (Signed)
Immediate Anesthesia Transfer of Care Note  Patient: Doris Jefferson  Procedure(s) Performed: COLONOSCOPY WITH PROPOFOL (N/A ) ESOPHAGOGASTRODUODENOSCOPY (EGD) WITH PROPOFOL (N/A )  Patient Location: PACU  Anesthesia Type:MAC  Level of Consciousness: awake, alert  and oriented  Airway & Oxygen Therapy: Patient Spontanous Breathing and Patient connected to nasal cannula oxygen  Post-op Assessment: Report given to RN and Post -op Vital signs reviewed and stable  Post vital signs: Reviewed and stable  Last Vitals:  Vitals:   11/13/17 0759 11/13/17 0809  BP: (!) 205/59 (!) 170/48  Pulse: 72   Resp: 16   Temp: 36.8 C   SpO2: 99%     Last Pain:  Vitals:   11/13/17 0759  TempSrc: Oral         Complications: No apparent anesthesia complications

## 2017-11-13 NOTE — Anesthesia Preprocedure Evaluation (Signed)
Anesthesia Evaluation  Patient identified by MRN, date of birth, ID band Patient awake    Reviewed: Allergy & Precautions, NPO status   Airway Mallampati: I       Dental no notable dental hx. (+) Teeth Intact   Pulmonary sleep apnea ,    Pulmonary exam normal breath sounds clear to auscultation       Cardiovascular hypertension,  Rhythm:Regular Rate:Normal + Systolic murmurs    Neuro/Psych PSYCHIATRIC DISORDERS Depression negative neurological ROS     GI/Hepatic Neg liver ROS,   Endo/Other  diabetes, Oral Hypoglycemic AgentsMorbid obesity  Renal/GU negative Renal ROS     Musculoskeletal negative musculoskeletal ROS (+)   Abdominal (+) + obese,   Peds  Hematology  (+) anemia ,   Anesthesia Other Findings Doris Jefferson  ECHO COMPLETE WO IMAGING ENHANCING AGENT  Order# [id number]  Reading physician: Thayer Headings, MD Ordering physician: Belva Crome, MD Study date: 09/21/17 Result Notes for ECHOCARDIOGRAM COMPLETE   Notes recorded by Loren Racer, LPN on 42/59/5638 at 1:46 PM EST Informed pt of results. Pt verbalized understanding. ------  Notes recorded by Belva Crome, MD on 09/22/2017 at 11:15 AM EST Let the patient know there is moderate aortic stenosis and need to follow with yearly echocardiogram. Notify if faint, chest pain, or SOB. A copy will be sent to [facility] Result   Result status: Final result                          [facility]                        [address], [place] [address]                            [phone]  ------------------------------------------------------------------- Transthoracic Echocardiography  Patient:    Doris Jefferson, Doris Jefferson MR #:       [id number] Study Date: 09/21/2017 Gender:     F Age:        77 Height:     160 cm Weight:     98.2 kg BSA:        2.14  m^2 Pt. Status: Room:   Ivar Bury, MD  REFERRING    Belva Crome, MD  [place], M.D.  SONOGRAPHER  Cindy Hazy, RDCS  PERFORMING   [facility], Outpatient  cc:  ------------------------------------------------------------------- LV EF: 55% -   60%  ------------------------------------------------------------------- Indications:      I35.9 Aortic Valve Disorder.  ------------------------------------------------------------------- History:   PMH:  Acquired from the patient and from the patient&'s chart.  PMH:  Anemia. Murmur. Paroxysmal atrial fibrillation. Obstructive sleep apnea.  Risk factors:  Hypertension. Diabetes mellitus. Obese. Dyslipidemia.  ------------------------------------------------------------------- Study Conclusions  - Left ventricle: The cavity size was normal. Wall thickness was   increased in a pattern of mild LVH. Systolic function was normal.   The estimated ejection fraction was in the range of 55% to 60%.   Wall motion was normal; there were no regional wall motion   abnormalities. Features are consistent with a pseudonormal left   ventricular filling pattern, with concomitant abnormal relaxation  and increased filling pressure (grade 2 diastolic dysfunction). - Aortic valve: Moderately calcified annulus. Moderately thickened,   moderately calcified leaflets. 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 - Left atrium: The atrium was mildly dilated. - Pulmonary arteries: Systolic pressure was moderately increased.   PA peak pressure: 60 mm Hg (S).     Reproductive/Obstetrics                             Anesthesia Physical Anesthesia Plan  ASA: III  Anesthesia Plan: MAC   Post-op Pain Management:    Induction:   PONV Risk Score and Plan: 2 and Ondansetron  Airway Management Planned: Natural Airway, Nasal Cannula and Simple Face Mask  Additional Equipment:   Intra-op Plan:    Post-operative Plan:   Informed Consent: I have reviewed the patients History and Physical, chart, labs and discussed the procedure including the risks, benefits and alternatives for the proposed anesthesia with the patient or authorized representative who has indicated his/her understanding and acceptance.     Plan Discussed with: CRNA  Anesthesia Plan Comments:         Anesthesia Quick Evaluation

## 2017-11-13 NOTE — H&P (Signed)
Date of Initial H&P: 11/09/17  History reviewed, patient examined, no change in status, stable for surgery.

## 2017-11-13 NOTE — Op Note (Signed)
[facility] Patient Name: Doris Jefferson Procedure Date: 11/13/2017 MRN: [id number] Attending MD: Lear Ng , MD Date of Birth: 1941-04-25 CSN: [id number] Age: 77 Admit Type: Outpatient Procedure:                Upper GI endoscopy Indications:              Iron deficiency anemia Providers:                Lear Ng, MD, Elmer Ramp. Tilden Dome, RN,                            Nevin Bloodgood, Technician, Virgia Land, CRNA Referring MD:              Medicines:                Propofol per Anesthesia, Monitored Anesthesia Care Complications:            No immediate complications. Estimated Blood Loss:     Estimated blood loss was minimal. Procedure:                Pre-Anesthesia Assessment:                           - Prior to the procedure, a History and Physical                            was performed, and patient medications and                            allergies were reviewed. The patient's tolerance of                            previous anesthesia was also reviewed. The risks                            and benefits of the procedure and the sedation                            options and risks were discussed with the patient.                            All questions were answered, and informed consent                            was obtained. Prior Anticoagulants: The patient has                            taken Eliquis (apixaban), last dose was 3 days                            prior to procedure. ASA Grade Assessment: III - A                            patient with severe systemic disease. After  reviewing the risks and benefits, the patient was                            deemed in satisfactory condition to undergo the                            procedure.                           After obtaining informed consent, the endoscope was                            passed under direct vision. Throughout the   procedure, the patient's blood pressure, pulse, and                            oxygen saturations were monitored continuously. The                            [id number] [phone]) scope was introduced through the                            mouth, and advanced to the second part of duodenum.                            The upper GI endoscopy was accomplished without                            difficulty. The patient tolerated the procedure                            well. Scope In: Scope Out: Findings:      The examined esophagus was normal.      The Z-line was regular and was found 40 cm from the incisors.      Patchy mild inflammation characterized by congestion (edema), erosions       and erythema was found in the prepyloric region of the stomach. Biopsies       were taken with a cold forceps for histology. Estimated blood loss was       minimal.      The exam of the stomach was otherwise normal.      The examined duodenum was normal. Biopsies for histology were taken with       a cold forceps for evaluation of celiac disease. Estimated blood loss       was minimal. Impression:               - Normal esophagus.                           - Z-line regular, 40 cm from the incisors.                           - Acute gastritis. Biopsied.                           - Normal examined duodenum. Biopsied. Moderate Sedation:  N/A- Per Anesthesia Care Recommendation:           - Await pathology results.                           - Resume previous diet.                           - Post procedure medication orders were given. Procedure Code(s):        --- Professional ---                           [phone], Esophagogastroduodenoscopy, flexible,                            transoral; with biopsy, single or multiple Diagnosis Code(s):        --- Professional ---                           D50.9, Iron deficiency anemia, unspecified                           K29.00, Acute gastritis without bleeding CPT  copyright 2016 American Medical Association. All rights reserved. The codes documented in this report are preliminary and upon coder review may  be revised to meet current compliance requirements. Lear Ng, MD 11/13/2017 10:19:39 AM This report has been signed electronically. Number of Addenda: 0

## 2017-11-15 ENCOUNTER — Other Ambulatory Visit: Payer: Self-pay | Admitting: Interventional Cardiology

## 2017-11-15 NOTE — Telephone Encounter (Signed)
Eliquis 5mg  refill request received; pt is 77 yrs old, wt-100.7kg, Crea-0.88 on 05/23/2017, last seen by Dr. Tamala Julian on 09/27/17. Will send in refill to requested pharmacy.

## 2017-11-22 ENCOUNTER — Ambulatory Visit (INDEPENDENT_AMBULATORY_CARE_PROVIDER_SITE_OTHER): Payer: Medicare Other | Admitting: Cardiology

## 2017-11-22 ENCOUNTER — Encounter: Payer: Self-pay | Admitting: Cardiology

## 2017-11-22 VITALS — BP 170/62 | HR 72 | Ht 63.0 in | Wt 224.0 lb

## 2017-11-22 DIAGNOSIS — I10 Essential (primary) hypertension: Secondary | ICD-10-CM | POA: Diagnosis not present

## 2017-11-22 DIAGNOSIS — I350 Nonrheumatic aortic (valve) stenosis: Secondary | ICD-10-CM

## 2017-11-22 DIAGNOSIS — I481 Persistent atrial fibrillation: Secondary | ICD-10-CM

## 2017-11-22 DIAGNOSIS — I272 Pulmonary hypertension, unspecified: Secondary | ICD-10-CM | POA: Diagnosis not present

## 2017-11-22 DIAGNOSIS — I4819 Other persistent atrial fibrillation: Secondary | ICD-10-CM

## 2017-11-22 DIAGNOSIS — G4733 Obstructive sleep apnea (adult) (pediatric): Secondary | ICD-10-CM

## 2017-11-22 NOTE — Patient Instructions (Addendum)
Medication Instructions:  Your physician recommends that you continue on your current medications as directed. Please refer to the Current Medication list given to you today.  Labwork: Today for basic metabolic panel and complete blood count  Testing/Procedures: Your physician has requested that you have an echocardiogram 09/2018. Echocardiography is a painless test that uses sound waves to create images of your heart. It provides your doctor with information about the size and shape of your heart and how well your heart's chambers and valves are working. This procedure takes approximately one hour. There are no restrictions for this procedure.  Your physician has recommended that you have a pulmonary function test. Pulmonary Function Tests are a group of tests that measure how well air moves in and out of your lungs.   Follow-Up: Your physician wants you to follow-up in: 6 months with Dr. Radford Pax. You will receive a reminder letter in the mail two months in advance. If you don't receive a letter, please call our office to schedule the follow-up appointment.  Any Other Special Instructions Will Be Listed Below (If Applicable).    Thank you for choosing [facility], RN  [phone]  If you need a refill on your cardiac medications before your next appointment, please call your pharmacy.

## 2017-11-22 NOTE — Progress Notes (Signed)
Cardiology Office Note:    Date:  11/22/2017   ID:  Doris Jefferson, DOB 10-04-1941, MRN [id number]  PCP:  [name]  Cardiologist:  Sinclair Grooms, MD    Referring MD: Josefa Half*   Chief Complaint  Patient presents with  . Atrial Fibrillation  . Aortic Stenosis    History of Present Illness:    Doris Jefferson is a 77 y.o. female with a hx of persistent afib s/p tikosyn load. She had significant QT prolongation on 09/08/15 and torsades that required CPR/defibrillation.Ultimately started on amiodarone and has been maintaining normal sinus rhythm since then,  She alsohas asymptomatic aortic stenosis.   She is here today for followup and is doing well.  She denies any chest pain or pressure, SOB, DOE (except for rushing around), PND, orthopnea, dizziness, palpitations or syncope. She occasionally will have some LE edema but not bothersome.  She is compliant with her meds and is tolerating meds with no SE.  She is doing well with her CPAP device.  She tolerates the nasal mask and feels the pressure is adequate.  Since going on CPAP she feels rested in the am and has no significant daytime sleepiness.  She has had some  nasal dryness and nose bleeds recently with the dry cold weather.  Past Medical History:  Diagnosis Date  . Anemia   . Diabetes ([place])   . Heart murmur   . HTN (hypertension)   . Hyperlipidemia   . Obesity (BMI 30-39.9) 03/04/2014  . OSA (obstructive sleep apnea)    severe with AHI 31/hr  . PAF (paroxysmal atrial fibrillation) ([place])     Past Surgical History:  Procedure Laterality Date  . ABDOMINAL HYSTERECTOMY    . CARDIOVERSION N/A 12/19/2013   Procedure: CARDIOVERSION;  Surgeon: Sinclair Grooms, MD;  Location: [facility] ENDOSCOPY;  Service: Cardiovascular;  Laterality: N/A;  . CARDIOVERSION N/A 08/16/2015   Procedure: CARDIOVERSION;  Surgeon: Larey Dresser, MD;  Location: [place];  Service: Cardiovascular;   Laterality: N/A;  . CARDIOVERSION N/A 09/13/2015   Procedure: CARDIOVERSION;  Surgeon: Sueanne Margarita, MD;  Location: [place];  Service: Cardiovascular;  Laterality: N/A;  . COLONOSCOPY WITH PROPOFOL N/A 11/13/2017   Procedure: COLONOSCOPY WITH PROPOFOL;  Surgeon: Wilford Corner, MD;  Location: [facility] ENDOSCOPY;  Service: Endoscopy;  Laterality: N/A;  ANTIBIOTIC NEEDED BEFORE PROCEDURE  . ESOPHAGOGASTRODUODENOSCOPY (EGD) WITH PROPOFOL N/A 11/13/2017   Procedure: ESOPHAGOGASTRODUODENOSCOPY (EGD) WITH PROPOFOL;  Surgeon: Wilford Corner, MD;  Location: [facility] ENDOSCOPY;  Service: Endoscopy;  Laterality: N/A;  . EYE SURGERY    . SPINE SURGERY      Current Medications: Current Meds  Medication Sig  . acetaminophen (TYLENOL) 325 MG tablet Take 650 mg by mouth every 6 (six) hours as needed for moderate pain or headache.  [name] amiodarone (PACERONE) 200 MG tablet TAKE 1 TABLET(200 MG) BY MOUTH DAILY  . buPROPion (WELLBUTRIN XL) 300 MG 24 hr tablet Take 300 mg by mouth daily.   [name] diltiazem (CARDIZEM CD) 180 MG 24 hr capsule Take 180 mg by mouth daily.  [name] doxylamine, Sleep, (UNISOM) 25 MG tablet Take 25 mg by mouth at bedtime as needed for sleep.  [name] ELIQUIS 5 MG TABS tablet TAKE 1 TABLET(5 MG) BY MOUTH TWICE DAILY  . furosemide (LASIX) 20 MG tablet TAKE 1 TABLET BY MOUTH DAILY  . glimepiride (AMARYL) 4 MG tablet Take 4 mg by mouth daily with breakfast.  . liraglutide (VICTOZA) 18 MG/3ML  SOPN Inject 1.8 mg into the skin daily.   [name] MAGNESIUM-OXIDE 400 (241.3 Mg) MG tablet TAKE 1 TABLET BY MOUTH TWICE DAILY  . metFORMIN (GLUCOPHAGE) 1000 MG tablet Take 1,000 mg by mouth 2 (two) times daily with a meal.  . metoprolol succinate (TOPROL-XL) 25 MG 24 hr tablet TAKE 1 TABLET(25 MG) BY MOUTH TWICE DAILY  . rosuvastatin (CRESTOR) 20 MG tablet Take 20 mg by mouth daily.  [name] terazosin (HYTRIN) 5 MG capsule Take 5 mg by mouth at bedtime.     Allergies:   Minocin [minocycline hcl]; Benazepril; Hydrochlorothiazide;  and Tape   Social History   Socioeconomic History  . Marital status: Married    Spouse name: None  . Number of children: None  . Years of education: None  . Highest education level: None  Social Needs  . Financial resource strain: None  . Food insecurity - worry: None  . Food insecurity - inability: None  . Transportation needs - medical: None  . Transportation needs - non-medical: None  Occupational History  . None  Tobacco Use  . Smoking status: Never Smoker  . Smokeless tobacco: Never Used  Substance and Sexual Activity  . Alcohol use: No  . Drug use: No  . Sexual activity: No  Other Topics Concern  . None  Social History Narrative  . None     Family History: The patient's family history includes Heart disease in her father; Hypertension in her brother and father; Melanoma in her mother.  ROS:   Please see the history of present illness.    ROS  All other systems reviewed and negative.   EKGs/Labs/Other Studies Reviewed:    The following studies were reviewed today: none  EKG:  EKG is not ordered today.    Recent Labs: 05/23/2017: BUN 16; Creatinine, Ser 0.88; Potassium 4.2; Sodium 143 09/21/2017: Hemoglobin 10.5; Platelets 190 09/27/2017: ALT 53; TSH 3.150   Recent Lipid Panel No results found for: CHOL, TRIG, HDL, CHOLHDL, VLDL, LDLCALC, LDLDIRECT  Physical Exam:    VS:  BP (!) 170/62 (BP Location: Left Arm, Patient Position: Sitting, Cuff Size: Large)   Pulse 72   Ht 5\' 3"  (1.6 m)   Wt 224 lb (101.6 kg)   SpO2 97%   BMI 39.68 kg/m     Wt Readings from Last 3 Encounters:  11/22/17 224 lb (101.6 kg)  11/13/17 222 lb (100.7 kg)  09/27/17 222 lb (100.7 kg)     GEN:  Well nourished, well developed in no acute distress HEENT: Normal NECK: No JVD;  LYMPHATICS: No lymphadenopathy CARDIAC: RRR, no  rubs, gallops.  2/6 mid peaking systolic murmur at RUSB to LLSB and right carotid RESPIRATORY:  Clear to auscultation without rales, wheezing or  rhonchi  ABDOMEN: Soft, non-tender, non-distended MUSCULOSKELETAL:  No edema; No deformity  SKIN: Warm and dry NEUROLOGIC:  Alert and oriented x 3 PSYCHIATRIC:  Normal affect   ASSESSMENT:    1. Persistent atrial fibrillation ([place])   2. Essential hypertension   3. Moderate aortic stenosis   4. Pulmonary HTN ([place])   5. OSA (obstructive sleep apnea)    PLAN:    In order of problems listed above:  1.  Persistent atrial fibrillation - she is maintaining NSR.  She will continue on Amiodarone 200mg  daily, apixaban 5mg  BID, Cardizem CD 180mg  daily and Toprol XL 25mg  BID.  I will check a BMET and CBC.  She is up to date on her TSH (3.12 and ALT 53  in 09/2017).  She is due to PFTs with [facility].  2.  HTN - her BP is poorly controlled on exam today.  She says that she has white coat HTN and goes up when rushing around.  At home it runs 130-145/ 60's.  She will continue on Losartan 50mg  daily, Toprol XL 25mg  BID, Cardizem CD 180mg  daily.  3.  Moderate AS- echo 09/2017 showed a mean AVG of 41mmHg.  I will repeat an echo in 1 year.    4.  Moderate pulmonary HTN - PASP 20mmHg by echo 09/2017 - this is stable from 2016 and is likely from Group 3 from OSA.   5.  OSA - the patient is tolerating PAP therapy well without any problems. The PAP download was reviewed today and showed an AHI of 0.6/hr on 14 cm H2O with 100% compliance in using more than 4 hours nightly.  The patient has been using and benefiting from PAP use and will continue to benefit from therapy. I encouraged her to use nasal saline spray twice daily to help with nasal dryness.     Medication Adjustments/Labs and Tests Ordered: Current medicines are reviewed at length with the patient today.  Concerns regarding medicines are outlined above.  No orders of the defined types were placed in this encounter.  No orders of the defined types were placed in this encounter.   Signed, Fransico Him, MD  11/22/2017 8:58 AM    [place]

## 2017-11-23 ENCOUNTER — Encounter: Payer: Medicare Other | Admitting: Internal Medicine

## 2017-11-23 DIAGNOSIS — I4819 Other persistent atrial fibrillation: Secondary | ICD-10-CM

## 2017-11-23 LAB — PULMONARY FUNCTION TEST
DL/VA % pred: 109 %
DL/VA: 4.99 ml/min/mmHg/L
DLCO COR: 20.38 ml/min/mmHg
DLCO UNC % PRED: 79 %
DLCO UNC: 17.25 ml/min/mmHg
DLCO cor % pred: 94 %
FEF 25-75 PRE: 1.32 L/s
FEF 25-75 Post: 1.61 L/sec
FEF2575-%Change-Post: 21 %
FEF2575-%PRED-POST: 110 %
FEF2575-%PRED-PRE: 91 %
FEV1-%Change-Post: 3 %
FEV1-%Pred-Post: 87 %
FEV1-%Pred-Pre: 84 %
FEV1-PRE: 1.56 L
FEV1-Post: 1.63 L
FEV1FVC-%Change-Post: 2 %
FEV1FVC-%PRED-PRE: 105 %
FEV6-%CHANGE-POST: 0 %
FEV6-%Pred-Post: 85 %
FEV6-%Pred-Pre: 84 %
FEV6-POST: 2.01 L
FEV6-Pre: 2 L
FEV6FVC-%PRED-POST: 105 %
FEV6FVC-%Pred-Pre: 105 %
FVC-%Change-Post: 0 %
FVC-%Pred-Post: 80 %
FVC-%Pred-Pre: 80 %
FVC-Post: 2.01 L
FVC-Pre: 2 L
POST FEV1/FVC RATIO: 81 %
PRE FEV1/FVC RATIO: 78 %
Post FEV6/FVC ratio: 100 %
Pre FEV6/FVC Ratio: 100 %
RV % PRED: 107 %
RV: 2.41 L
TLC % pred: 96 %
TLC: 4.57 L

## 2017-11-23 LAB — BASIC METABOLIC PANEL
BUN/Creatinine Ratio: 20 (ref 12–28)
BUN: 15 mg/dL (ref 8–27)
CALCIUM: 8.8 mg/dL (ref 8.7–10.3)
CHLORIDE: 103 mmol/L (ref 96–106)
CO2: 21 mmol/L (ref 20–29)
Creatinine, Ser: 0.76 mg/dL (ref 0.57–1.00)
GFR calc non Af Amer: 76 mL/min/{1.73_m2} (ref >59)
GFR, EST AFRICAN AMERICAN: 88 mL/min/{1.73_m2} (ref >59)
Glucose: 193 mg/dL — ABNORMAL HIGH (ref 65–99)
Potassium: 4.1 mmol/L (ref 3.5–5.2)
Sodium: 141 mmol/L (ref 134–144)

## 2017-11-23 LAB — CBC
Hematocrit: 30.3 % — ABNORMAL LOW (ref 34.0–46.6)
Hemoglobin: 9.3 g/dL — ABNORMAL LOW (ref 11.1–15.9)
MCH: 25.1 pg — ABNORMAL LOW (ref 26.6–33.0)
MCHC: 30.7 g/dL — ABNORMAL LOW (ref 31.5–35.7)
MCV: 82 fL (ref 79–97)
PLATELETS: 202 10*3/uL (ref 150–379)
RBC: 3.71 x10E6/uL — AB (ref 3.77–5.28)
RDW: 15.6 % — ABNORMAL HIGH (ref 12.3–15.4)
WBC: 8 10*3/uL (ref 3.4–10.8)

## 2017-12-10 ENCOUNTER — Emergency Department (HOSPITAL_COMMUNITY): Payer: Medicare Other

## 2017-12-10 ENCOUNTER — Encounter (HOSPITAL_COMMUNITY): Payer: Self-pay

## 2017-12-10 ENCOUNTER — Other Ambulatory Visit: Payer: Self-pay

## 2017-12-10 ENCOUNTER — Emergency Department (HOSPITAL_COMMUNITY)
Admission: EM | Admit: 2017-12-10 | Discharge: 2017-12-10 | Disposition: A | Discharge status: Home / Self Care | Payer: Medicare Other | Attending: Emergency Medicine | Admitting: Emergency Medicine

## 2017-12-10 DIAGNOSIS — W010XXA Fall on same level from slipping, tripping and stumbling without subsequent striking against object, initial encounter: Secondary | ICD-10-CM | POA: Diagnosis not present

## 2017-12-10 DIAGNOSIS — Y999 Unspecified external cause status: Secondary | ICD-10-CM | POA: Insufficient documentation

## 2017-12-10 DIAGNOSIS — S0990XA Unspecified injury of head, initial encounter: Secondary | ICD-10-CM | POA: Diagnosis present

## 2017-12-10 DIAGNOSIS — S0083XA Contusion of other part of head, initial encounter: Secondary | ICD-10-CM | POA: Diagnosis not present

## 2017-12-10 DIAGNOSIS — S63641A Sprain of metacarpophalangeal joint of right thumb, initial encounter: Secondary | ICD-10-CM | POA: Insufficient documentation

## 2017-12-10 DIAGNOSIS — Z7984 Long term (current) use of oral hypoglycemic drugs: Secondary | ICD-10-CM | POA: Insufficient documentation

## 2017-12-10 DIAGNOSIS — Z79899 Other long term (current) drug therapy: Secondary | ICD-10-CM | POA: Diagnosis not present

## 2017-12-10 DIAGNOSIS — Y929 Unspecified place or not applicable: Secondary | ICD-10-CM | POA: Diagnosis not present

## 2017-12-10 DIAGNOSIS — I10 Essential (primary) hypertension: Secondary | ICD-10-CM | POA: Insufficient documentation

## 2017-12-10 DIAGNOSIS — Y939 Activity, unspecified: Secondary | ICD-10-CM | POA: Diagnosis not present

## 2017-12-10 DIAGNOSIS — E119 Type 2 diabetes mellitus without complications: Secondary | ICD-10-CM | POA: Diagnosis not present

## 2017-12-10 DIAGNOSIS — W19XXXA Unspecified fall, initial encounter: Secondary | ICD-10-CM

## 2017-12-10 LAB — COMPREHENSIVE METABOLIC PANEL
ALBUMIN: 3.1 g/dL — AB (ref 3.5–5.0)
ALK PHOS: 65 U/L (ref 38–126)
ALT: 80 U/L — ABNORMAL HIGH (ref 14–54)
ANION GAP: 14 (ref 5–15)
AST: 84 U/L — ABNORMAL HIGH (ref 15–41)
BUN: 18 mg/dL (ref 6–20)
CALCIUM: 8.5 mg/dL — AB (ref 8.9–10.3)
CO2: 19 mmol/L — AB (ref 22–32)
Chloride: 101 mmol/L (ref 101–111)
Creatinine, Ser: 0.98 mg/dL (ref 0.44–1.00)
GFR calc Af Amer: >60 mL/min (ref >60)
GFR calc non Af Amer: 54 mL/min — ABNORMAL LOW (ref >60)
GLUCOSE: 346 mg/dL — AB (ref 65–99)
Potassium: 4.6 mmol/L (ref 3.5–5.1)
SODIUM: 134 mmol/L — AB (ref 135–145)
Total Bilirubin: 0.7 mg/dL (ref 0.3–1.2)
Total Protein: 6 g/dL — ABNORMAL LOW (ref 6.5–8.1)

## 2017-12-10 LAB — PROTIME-INR
INR: 1.37
PROTHROMBIN TIME: 16.8 s — AB (ref 11.4–15.2)

## 2017-12-10 LAB — DIFFERENTIAL
BASOS ABS: 0 10*3/uL (ref 0.0–0.1)
Basophils Relative: 0 %
Eosinophils Absolute: 0 10*3/uL (ref 0.0–0.7)
Eosinophils Relative: 0 %
LYMPHS PCT: 11 %
Lymphs Abs: 1.7 10*3/uL (ref 0.7–4.0)
Monocytes Absolute: 0.6 10*3/uL (ref 0.1–1.0)
Monocytes Relative: 4 %
NEUTROS ABS: 13.9 10*3/uL — AB (ref 1.7–7.7)
NEUTROS PCT: 85 %

## 2017-12-10 LAB — CBC
HCT: 33.7 % — ABNORMAL LOW (ref 36.0–46.0)
Hemoglobin: 10.1 g/dL — ABNORMAL LOW (ref 12.0–15.0)
MCH: 24.3 pg — ABNORMAL LOW (ref 26.0–34.0)
MCHC: 30 g/dL (ref 30.0–36.0)
MCV: 81 fL (ref 78.0–100.0)
PLATELETS: 252 10*3/uL (ref 150–400)
RBC: 4.16 MIL/uL (ref 3.87–5.11)
RDW: 16 % — ABNORMAL HIGH (ref 11.5–15.5)
WBC: 16.3 10*3/uL — AB (ref 4.0–10.5)

## 2017-12-10 LAB — APTT: APTT: 29 s (ref 24–36)

## 2017-12-10 MED ORDER — HYDROCODONE-ACETAMINOPHEN 5-325 MG PO TABS: 1.0000 | ORAL_TABLET | ORAL | 0 refills | Status: DC | PRN

## 2017-12-10 NOTE — ED Provider Notes (Signed)
[facility] EMERGENCY DEPARTMENT Provider Note   CSN: [id number] Arrival date & time: 12/10/17  1632     History   Chief Complaint Chief Complaint  Patient presents with  . Fall  . unequal pupils    HPI Doris Jefferson is a 77 y.o. female.  Pt presents to the ED today s/p fall.  She said she lost her balance and tripped.  She did hit her head, but she denied loc.  She is on Eliquis for a hx of a.fib.  Pt denied loc.  She denies h/a.  She also c/o right wrist/thumb pain.  She also had a contusion to left knee, but is ambulatory w/o problems.      Past Medical History:  Diagnosis Date  . Anemia   . Diabetes ([place])   . Heart murmur   . HTN (hypertension)   . Hyperlipidemia   . Obesity (BMI 30-39.9) 03/04/2014  . OSA (obstructive sleep apnea)    severe with AHI 31/hr  . PAF (paroxysmal atrial fibrillation) [facility])     Patient Active Problem List   Diagnosis Date Noted  . Iron deficiency anemia, unspecified 11/13/2017  . Visit for monitoring Tikosyn therapy 09/06/2015  . Persistent atrial fibrillation ([place]) 06/23/2014  . Obesity (BMI 30-39.9) 03/04/2014  . HTN (hypertension)   . OSA (obstructive sleep apnea) 12/31/2013  . Anticoagulation goal of INR 2 to 3 11/17/2013  . Moderate aortic stenosis 11/10/2013  . Pulmonary HTN ([place]) 11/10/2013  . Diabetes mellitus ([place]) 11/10/2013    Past Surgical History:  Procedure Laterality Date  . ABDOMINAL HYSTERECTOMY    . CARDIOVERSION N/A 12/19/2013   Procedure: CARDIOVERSION;  Surgeon: Sinclair Grooms, MD;  Location: [facility] ENDOSCOPY;  Service: Cardiovascular;  Laterality: N/A;  . CARDIOVERSION N/A 08/16/2015   Procedure: CARDIOVERSION;  Surgeon: Larey Dresser, MD;  Location: [place];  Service: Cardiovascular;  Laterality: N/A;  . CARDIOVERSION N/A 09/13/2015   Procedure: CARDIOVERSION;  Surgeon: Sueanne Margarita, MD;  Location: [place];  Service: Cardiovascular;  Laterality: N/A;  . COLONOSCOPY WITH  PROPOFOL N/A 11/13/2017   Procedure: COLONOSCOPY WITH PROPOFOL;  Surgeon: Wilford Corner, MD;  Location: [facility] ENDOSCOPY;  Service: Endoscopy;  Laterality: N/A;  ANTIBIOTIC NEEDED BEFORE PROCEDURE  . ESOPHAGOGASTRODUODENOSCOPY (EGD) WITH PROPOFOL N/A 11/13/2017   Procedure: ESOPHAGOGASTRODUODENOSCOPY (EGD) WITH PROPOFOL;  Surgeon: Wilford Corner, MD;  Location: [facility] ENDOSCOPY;  Service: Endoscopy;  Laterality: N/A;  . EYE SURGERY    . SPINE SURGERY      OB History    No data available       Home Medications    Prior to Admission medications   Medication Sig Start Date End Date Taking? Authorizing Provider  acetaminophen (TYLENOL) 325 MG tablet Take 650 mg by mouth every 6 (six) hours as needed for moderate pain or headache.    [provider]  amiodarone (PACERONE) 200 MG tablet TAKE 1 TABLET(200 MG) BY MOUTH DAILY 10/16/17   Belva Crome, MD  buPROPion (WELLBUTRIN XL) 300 MG 24 hr tablet Take 300 mg by mouth daily.  01/11/16   [provider]  diltiazem (CARDIZEM CD) 180 MG 24 hr capsule Take 180 mg by mouth daily. 03/27/17   [provider]  doxylamine, Sleep, (UNISOM) 25 MG tablet Take 25 mg by mouth at bedtime as needed for sleep.    [provider]  ELIQUIS 5 MG TABS tablet TAKE 1 TABLET(5 MG) BY MOUTH TWICE DAILY 11/15/17   Daneen Schick  W, MD  furosemide (LASIX) 20 MG tablet TAKE 1 TABLET BY MOUTH DAILY 10/16/17   Belva Crome, MD  glimepiride (AMARYL) 4 MG tablet Take 4 mg by mouth daily with breakfast.    [provider]  HYDROcodone-acetaminophen (NORCO/VICODIN) 5-325 MG tablet Take 1 tablet by mouth every 4 (four) hours as needed. 12/10/17   Isla Pence, MD  liraglutide (VICTOZA) 18 MG/3ML SOPN Inject 1.8 mg into the skin daily.     [provider]  losartan (COZAAR) 50 MG tablet Take 1 tablet (50 mg total) by mouth daily. 04/04/17 11/01/17  Belva Crome, MD  MAGNESIUM-OXIDE 400 (241.3 Mg) MG tablet TAKE 1 TABLET BY MOUTH TWICE  DAILY 09/11/17   Belva Crome, MD  metFORMIN (GLUCOPHAGE) 1000 MG tablet Take 1,000 mg by mouth 2 (two) times daily with a meal.    [provider]  metoprolol succinate (TOPROL-XL) 25 MG 24 hr tablet TAKE 1 TABLET(25 MG) BY MOUTH TWICE DAILY 09/20/17   Belva Crome, MD  rosuvastatin (CRESTOR) 20 MG tablet Take 20 mg by mouth daily.    [provider]  terazosin (HYTRIN) 5 MG capsule Take 5 mg by mouth at bedtime.    [provider]    Family History Family History  Problem Relation Age of Onset  . Hypertension Father   . Heart disease Father   . Melanoma Mother   . Hypertension Brother     Social History Social History   Tobacco Use  . Smoking status: Never Smoker  . Smokeless tobacco: Never Used  Substance Use Topics  . Alcohol use: No  . Drug use: No     Allergies   Minocin [minocycline hcl]; Benazepril; Hydrochlorothiazide; and Tape   Review of Systems Review of Systems  HENT:       Bruising around right eye  Musculoskeletal:       Swelling to right thumb/wrist.  All other systems reviewed and are negative.    Physical Exam Updated Vital Signs BP (!) 174/49 (BP Location: Left Arm)   Pulse 72   Temp 98.4 F (36.9 C) (Oral)   Resp 18   Ht 5\' 3"  (1.6 m)   Wt 99.8 kg (220 lb)   SpO2 99%   BMI 38.97 kg/m   Physical Exam  Constitutional: She is oriented to person, place, and time. She appears well-developed and well-nourished.  HENT:  Right Ear: External ear normal.  Left Ear: External ear normal.  Nose: Nose normal.  Mouth/Throat: Oropharynx is clear and moist.  Bruising around right eye.   Eyes: Conjunctivae and EOM are normal.  No pain with EOM.  Mild pupil anisocoria.  Hx eye surgery.  Neck: Normal range of motion. Neck supple.  Cardiovascular: Normal rate, regular rhythm, normal heart sounds and intact distal pulses.  Pulmonary/Chest: Effort normal and breath sounds normal.  Abdominal: Soft. Bowel sounds are normal.    Musculoskeletal:       Legs: Swelling and ecchymosis to right thumb/wrist.  Decreased rom to 1st mcp joint  Neurological: She is alert and oriented to person, place, and time.  Skin: Skin is warm. Capillary refill takes less than 2 seconds.  Psychiatric: She has a normal mood and affect. Her behavior is normal. Judgment and thought content normal.  Nursing note and vitals reviewed.    ED Treatments / Results  Labs (all labs ordered are listed, but only abnormal results are displayed) Labs Reviewed  CBC - Abnormal; Notable for the following  components:      Result Value   WBC 16.3 (*)    Hemoglobin 10.1 (*)    HCT 33.7 (*)    MCH 24.3 (*)    RDW 16.0 (*)    All other components within normal limits  DIFFERENTIAL - Abnormal; Notable for the following components:   Neutro Abs 13.9 (*)    All other components within normal limits  PROTIME-INR  APTT  COMPREHENSIVE METABOLIC PANEL    EKG  EKG Interpretation None       Radiology Dg Wrist Complete Right  Result Date: 12/10/2017 CLINICAL DATA:  Fall, wrist pain EXAM: RIGHT WRIST - COMPLETE 3+ VIEW COMPARISON:  None. FINDINGS: Negative for fracture. Mild degenerative change of the base of the thumb. Mild thickening of the fifth metacarpal which may be due to chronic fracture IMPRESSION: Negative for acute fracture. Electronically Signed   By: Franchot Gallo M.D.   On: 12/10/2017 17:24   Ct Head Wo Contrast  Result Date: 12/10/2017 CLINICAL DATA:  Mechanical fall.  Swelling over the right eye. EXAM: CT HEAD WITHOUT CONTRAST CT MAXILLOFACIAL WITHOUT CONTRAST TECHNIQUE: Multidetector CT imaging of the head and maxillofacial structures were performed using the standard protocol without intravenous contrast. Multiplanar CT image reconstructions of the maxillofacial structures were also generated. COMPARISON:  MRI brain 07/11/2006. FINDINGS: CT HEAD FINDINGS Brain: A calcified meningioma overlying the right parietal cortex measures 13 mm,  minimally changed since 2007. A small on plaque calcification is present over the posterior left frontal lobe, likely a a second small meningioma, also present on the prior study. Moderate generalized atrophy and diffuse white matter disease is stable. No acute infarct, hemorrhage, or mass lesion is present. Ventricles are proportionate to the degree of atrophy. No significant extra-axial fluid collection is present. Vascular: Atherosclerotic calcifications are present within the cavernous internal carotid arteries and at the dural margin of both vertebral arteries. There is no hyperdense vessel. Skull: Calvarium is intact. No acute or healing fractures are present. No focal lytic or blastic lesions are evident. CT MAXILLOFACIAL FINDINGS Osseous: No acute or healing fractures are present. Osseous structures about the right orbit are intact. Nasal bones are intact. Orbits: Bilateral lens replacements are present. Globes and orbits are otherwise within normal limits. Sinuses: Fluid is present in the sphenoid sinuses bilaterally, right greater than left. No associated fracture is evident. Minimal mucosal thickening is present along the floor of the left maxillary sinus. No other fluid levels are present. Soft tissues: Soft tissue swelling is present over the right side of the face and orbit. The right supraorbital soft tissue hematoma is present. There are no underlying fractures. The zygomatic arch is intact. IMPRESSION: 1. Right periorbital soft tissue swelling and hematoma. 2. Soft tissue swelling over the right side of the face. 3. No underlying fracture. 4. Small fluid levels within the sphenoid sinus bilaterally without associated fracture. 5. Atrophy and white matter disease likely reflects the sequela of chronic microvascular ischemia. 6. No acute intracranial abnormality. 7. 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 Electronically Signed   By: San Morelle M.D.   On: 12/10/2017 17:26   Dg  Hand Complete Right  Result Date: 12/10/2017 CLINICAL DATA:  Fall, right wrist pain EXAM: RIGHT HAND - COMPLETE 3+ VIEW COMPARISON:  None. FINDINGS: Negative for fracture. Mild degenerative change base of thumb and first interphalangeal joint. Mild thickening of the fifth metacarpal which may be due to healed fracture. No erosion. IMPRESSION: Negative for fracture. Electronically Signed  By: Franchot Gallo M.D.   On: 12/10/2017 17:24   Ct Maxillofacial Wo Contrast  Result Date: 12/10/2017 CLINICAL DATA:  Mechanical fall.  Swelling over the right eye. EXAM: CT HEAD WITHOUT CONTRAST CT MAXILLOFACIAL WITHOUT CONTRAST TECHNIQUE: Multidetector CT imaging of the head and maxillofacial structures were performed using the standard protocol without intravenous contrast. Multiplanar CT image reconstructions of the maxillofacial structures were also generated. COMPARISON:  MRI brain 07/11/2006. FINDINGS: CT HEAD FINDINGS Brain: A calcified meningioma overlying the right parietal cortex measures 13 mm, minimally changed since 2007. A small on plaque calcification is present over the posterior left frontal lobe, likely a a second small meningioma, also present on the prior study. Moderate generalized atrophy and diffuse white matter disease is stable. No acute infarct, hemorrhage, or mass lesion is present. Ventricles are proportionate to the degree of atrophy. No significant extra-axial fluid collection is present. Vascular: Atherosclerotic calcifications are present within the cavernous internal carotid arteries and at the dural margin of both vertebral arteries. There is no hyperdense vessel. Skull: Calvarium is intact. No acute or healing fractures are present. No focal lytic or blastic lesions are evident. CT MAXILLOFACIAL FINDINGS Osseous: No acute or healing fractures are present. Osseous structures about the right orbit are intact. Nasal bones are intact. Orbits: Bilateral lens replacements are present. Globes and  orbits are otherwise within normal limits. Sinuses: Fluid is present in the sphenoid sinuses bilaterally, right greater than left. No associated fracture is evident. Minimal mucosal thickening is present along the floor of the left maxillary sinus. No other fluid levels are present. Soft tissues: Soft tissue swelling is present over the right side of the face and orbit. The right supraorbital soft tissue hematoma is present. There are no underlying fractures. The zygomatic arch is intact. IMPRESSION: 1. Right periorbital soft tissue swelling and hematoma. 2. Soft tissue swelling over the right side of the face. 3. No underlying fracture. 4. Small fluid levels within the sphenoid sinus bilaterally without associated fracture. 5. Atrophy and white matter disease likely reflects the sequela of chronic microvascular ischemia. 6. No acute intracranial abnormality. 7. 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 Electronically Signed   By: San Morelle M.D.   On: 12/10/2017 17:26    Procedures Procedures (including critical care time)  Medications Ordered in ED Medications - No data to display   Initial Impression / Assessment and Plan / ED Course  I have reviewed the triage vital signs and the nursing notes.  Pertinent labs & imaging results that were available during my care of the patient were reviewed by me and considered in my medical decision making (see chart for details).   Due to swelling and tenderness to base of right thumb, pt placed in a thumb spica splint.  Pt's injury occurred last night and she is neurologically intact.  Pt is stable for d/c.   Final Clinical Impressions(s) / ED Diagnoses   Final diagnoses:  Fall, initial encounter  Contusion of face, initial encounter  Sprain of metacarpophalangeal (MCP) joint of right thumb, initial encounter    ED Discharge Orders        Ordered    HYDROcodone-acetaminophen (NORCO/VICODIN) 5-325 MG tablet  Every 4 hours PRN       12/10/17 1737       Isla Pence, MD 12/10/17 1737

## 2017-12-10 NOTE — ED Notes (Signed)
Ortho paged. 

## 2017-12-10 NOTE — Progress Notes (Signed)
Orthopedic Tech Progress Note Patient Details:  Doris Jefferson [facility] 03-Mar-1941 [id number]  Ortho Devices Type of Ortho Device: Thumb velcro splint Ortho Device/Splint Interventions: Application   Post Interventions Patient Tolerated: Well Instructions Provided: Care of device   [place] Pink 12/10/2017, 5:48 PM

## 2017-12-10 NOTE — ED Notes (Signed)
Pt in imaging; radiology called and informed to bring her to B16.

## 2017-12-10 NOTE — ED Triage Notes (Addendum)
Pt from home with complaint of mechanical fall. Denied loc or dizziness. Pt has swelling to right eye and right wrist. No dizziness since fall and no visual changes. Pt noted to have smaller right pupil than the left. Hypertensive in triage, denies headache. Pupils are briskly reactive to light. Pt axox4.

## 2017-12-10 NOTE — ED Notes (Signed)
Coming from PCP office-states she fell yesterday-states pupils are unequal today-sending to ED for eval

## 2018-02-08 DIAGNOSIS — R42 Dizziness and giddiness: Secondary | ICD-10-CM | POA: Insufficient documentation

## 2018-03-17 ENCOUNTER — Other Ambulatory Visit: Payer: Self-pay | Admitting: Interventional Cardiology

## 2018-03-18 ENCOUNTER — Encounter: Payer: Self-pay | Admitting: Nurse Practitioner

## 2018-04-01 ENCOUNTER — Other Ambulatory Visit: Payer: Medicare Other | Admitting: *Deleted

## 2018-04-01 DIAGNOSIS — I10 Essential (primary) hypertension: Secondary | ICD-10-CM

## 2018-04-01 LAB — HEPATIC FUNCTION PANEL
ALK PHOS: 69 IU/L (ref 39–117)
ALT: 41 IU/L — AB (ref 0–32)
AST: 67 IU/L — ABNORMAL HIGH (ref 0–40)
Albumin: 4.1 g/dL (ref 3.5–4.8)
BILIRUBIN, DIRECT: 0.19 mg/dL (ref 0.00–0.40)
Bilirubin Total: 0.4 mg/dL (ref 0.0–1.2)
Total Protein: 6.1 g/dL (ref 6.0–8.5)

## 2018-04-02 ENCOUNTER — Ambulatory Visit: Payer: Medicare Other | Admitting: Nurse Practitioner

## 2018-04-02 NOTE — Progress Notes (Deleted)
CARDIOLOGY OFFICE NOTE  Date:  04/02/2018    Doris Jefferson Date of Birth: June 07, 1941 Medical Record #[id number]  PCP:  [facility]  Cardiologist:  Doris Jefferson & ***    No chief complaint on file.   History of Present Illness: Doris Jefferson is a 77 y.o. female who presents today for a ***  follow-up of persistent afib s/p hospitalization for tikosyn load.She had significant QT prolongation on 09/08/15 and torsades that required CPR/defibrillation.Ultimately started on amiodarone and maintaining normal sinus rhythmAlso with asymptomatic aortic stenosis.   She is doing well.  She denies dyspnea, syncope, and chest pain.  She has had no blood in the urine or stool.  No evidence of lower extremity swelling or clinical orthopnea/PND.  Exertional tolerance is been stable.  She is quite happy with quality of life.  She denies transient neurological symptoms.  Comes in today. Here with   Past Medical History:  Diagnosis Date  . Anemia   . Diabetes ([place])   . Heart murmur   . HTN (hypertension)   . Hyperlipidemia   . Obesity (BMI 30-39.9) 03/04/2014  . OSA (obstructive sleep apnea)    severe with AHI 31/hr  . PAF (paroxysmal atrial fibrillation) ([place])     Past Surgical History:  Procedure Laterality Date  . ABDOMINAL HYSTERECTOMY    . CARDIOVERSION N/A 12/19/2013   Procedure: CARDIOVERSION;  Surgeon: Sinclair Grooms, MD;  Location: [facility] ENDOSCOPY;  Service: Cardiovascular;  Laterality: N/A;  . CARDIOVERSION N/A 08/16/2015   Procedure: CARDIOVERSION;  Surgeon: Larey Dresser, MD;  Location: [place];  Service: Cardiovascular;  Laterality: N/A;  . CARDIOVERSION N/A 09/13/2015   Procedure: CARDIOVERSION;  Surgeon: Sueanne Margarita, MD;  Location: [place];  Service: Cardiovascular;  Laterality: N/A;  . COLONOSCOPY WITH PROPOFOL N/A 11/13/2017   Procedure: COLONOSCOPY WITH PROPOFOL;  Surgeon: Wilford Corner, MD;  Location: [facility] ENDOSCOPY;   Service: Endoscopy;  Laterality: N/A;  ANTIBIOTIC NEEDED BEFORE PROCEDURE  . ESOPHAGOGASTRODUODENOSCOPY (EGD) WITH PROPOFOL N/A 11/13/2017   Procedure: ESOPHAGOGASTRODUODENOSCOPY (EGD) WITH PROPOFOL;  Surgeon: Wilford Corner, MD;  Location: [facility] ENDOSCOPY;  Service: Endoscopy;  Laterality: N/A;  . EYE SURGERY    . SPINE SURGERY       Medications: No outpatient medications have been marked as taking for the 04/02/18 encounter (Appointment) with Burtis Junes, NP.     Allergies: Allergies  Allergen Reactions  . Minocin [Minocycline Hcl] Swelling and Other (See Comments)    Throat swelling  . Benazepril Cough  . Hydrochlorothiazide Itching  . Tape Rash    Social History: The patient  reports that she has never smoked. She has never used smokeless tobacco. She reports that she does not drink alcohol or use drugs.   Family History: The patient's ***family history includes Heart disease in her father; Hypertension in her brother and father; Melanoma in her mother.   Review of Systems: Please see the history of present illness.   Otherwise, the review of systems is positive for {NONE DEFAULTED:18576::"none"}.   All other systems are reviewed and negative.   Physical Exam: VS:  There were no vitals taken for this visit. Doris Jefferson  BMI There is no height or weight on file to calculate BMI.  Wt Readings from Last 3 Encounters:  12/10/17 220 lb (99.8 kg)  11/22/17 224 lb (101.6 kg)  11/13/17 222 lb (100.7 kg)    General: Pleasant. Well developed, well nourished and in no acute distress.  HEENT: Normal.  Neck: Supple, no JVD, carotid bruits, or masses noted.  Cardiac: ***Regular rate and rhythm. No murmurs, rubs, or gallops. No edema.  Respiratory:  Lungs are clear to auscultation bilaterally with normal work of breathing.  GI: Soft and nontender.  MS: No deformity or atrophy. Gait and ROM intact.  Skin: Warm and dry. Color is normal.  Neuro:  Strength and sensation are intact and no  gross focal deficits noted.  Psych: Alert, appropriate and with normal affect.   LABORATORY DATA:  EKG:  EKG {ACTION; IS/IS BUL:[id number]} ordered today. This demonstrates ***.  Lab Results  Component Value Date   WBC 16.3 (H) 12/10/2017   HGB 10.1 (L) 12/10/2017   HCT 33.7 (L) 12/10/2017   PLT 252 12/10/2017   GLUCOSE 346 (H) 12/10/2017   ALT 41 (H) 04/01/2018   AST 67 (H) 04/01/2018   NA 134 (L) 12/10/2017   K 4.6 12/10/2017   CL 101 12/10/2017   CREATININE 0.98 12/10/2017   BUN 18 12/10/2017   CO2 19 (L) 12/10/2017   TSH 3.150 09/27/2017   INR 1.37 12/10/2017     BNP (last 3 results) No results for input(s): BNP in the last 8760 hours.  ProBNP (last 3 results) No results for input(s): PROBNP in the last 8760 hours.   Other Studies Reviewed Today:   Assessment/Plan: 2D Doppler echocardiogram 09/21/2017:  Study Conclusions  - Left ventricle: The cavity size was normal. Wall thickness was increased in a pattern of mild LVH. Systolic function was normal. The estimated ejection fraction was in the range of 55% to 60%. Wall motion was normal; there were no regional wall motion abnormalities. Features are consistent with a pseudonormal left ventricular filling pattern, with concomitant abnormal relaxation and increased filling pressure (grade 2 diastolic dysfunction). - Aortic valve: Moderately calcified annulus. Moderately thickened, moderately calcified leaflets. 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 - Left atrium: The atrium was mildly dilated. - Pulmonary arteries: Systolic pressure was moderately increased. PA peak pressure: 60 mm Hg (S).    ASSESSMENT:    1. Moderate aortic stenosis   2. Persistent atrial fibrillation ([place])   3. Essential hypertension   4. OSA (obstructive sleep apnea)   5. Anticoagulation goal of INR 2 to 3   6. Long term current use of amiodarone      PLAN:  In order of problems listed above:  1. Moderate  stenosis based on recent echocardiographic study.  Will update problem list. 2. Resolved normal sinus rhythm trolled on amiodarone therapy. 3. Excellent blood pressure control. 4. Continue CPAP. 5. Continue apixaban.  Monitor for evidence of bleeding in stool, urine, skin. 6. TSH and liver function test today to monitor amiodarone.  Return in 6 months for follow-up.  Repeat blood work at that time and consider chest x-ray.  Current medicines are reviewed with the patient today.  The patient does not have concerns regarding medicines other than what has been noted above.  The following changes have been made:  See above.  Labs/ tests ordered today include:   No orders of the defined types were placed in this encounter.    Disposition:   FU with *** in {gen number 0-32:[id number]} {Days to years:10300}.   Patient is agreeable to this plan and will call if any problems develop in the interim.   SignedTruitt Merle, NP  04/02/2018 7:41 AM  [facility] [address] [place], [place]  [address] Phone: [phone] Fax: [phone])  [phone]        

## 2018-04-16 ENCOUNTER — Telehealth: Payer: Self-pay | Admitting: Oncology

## 2018-04-16 ENCOUNTER — Encounter: Payer: Self-pay | Admitting: Oncology

## 2018-04-16 NOTE — Telephone Encounter (Signed)
Pt has been scheduled to see Dr. Alen Blew on 7/26 at 11am. This is a hematology referral from Dr. Michail Sermon at [facility]. Pt aware to arrive 30 minutes early. Letter mailed.

## 2018-04-20 ENCOUNTER — Emergency Department (HOSPITAL_COMMUNITY): Payer: Medicare Other

## 2018-04-20 ENCOUNTER — Encounter (HOSPITAL_COMMUNITY): Payer: Self-pay

## 2018-04-20 ENCOUNTER — Other Ambulatory Visit: Payer: Self-pay

## 2018-04-20 ENCOUNTER — Emergency Department (HOSPITAL_COMMUNITY)
Admission: EM | Admit: 2018-04-20 | Discharge: 2018-04-20 | Disposition: A | Discharge status: Home / Self Care | Payer: Medicare Other | Attending: Emergency Medicine | Admitting: Emergency Medicine

## 2018-04-20 DIAGNOSIS — R079 Chest pain, unspecified: Secondary | ICD-10-CM | POA: Diagnosis present

## 2018-04-20 DIAGNOSIS — I10 Essential (primary) hypertension: Secondary | ICD-10-CM | POA: Insufficient documentation

## 2018-04-20 DIAGNOSIS — R0789 Other chest pain: Secondary | ICD-10-CM | POA: Diagnosis not present

## 2018-04-20 DIAGNOSIS — Z79899 Other long term (current) drug therapy: Secondary | ICD-10-CM | POA: Diagnosis not present

## 2018-04-20 DIAGNOSIS — E119 Type 2 diabetes mellitus without complications: Secondary | ICD-10-CM | POA: Diagnosis not present

## 2018-04-20 DIAGNOSIS — Z7901 Long term (current) use of anticoagulants: Secondary | ICD-10-CM | POA: Diagnosis not present

## 2018-04-20 LAB — BASIC METABOLIC PANEL
ANION GAP: 11 (ref 5–15)
BUN: 13 mg/dL (ref 8–23)
CALCIUM: 8.9 mg/dL (ref 8.9–10.3)
CO2: 22 mmol/L (ref 22–32)
Chloride: 108 mmol/L (ref 98–111)
Creatinine, Ser: 1.09 mg/dL — ABNORMAL HIGH (ref 0.44–1.00)
GFR calc Af Amer: 55 mL/min — ABNORMAL LOW (ref >60)
GFR, EST NON AFRICAN AMERICAN: 48 mL/min — AB (ref >60)
GLUCOSE: 185 mg/dL — AB (ref 70–99)
POTASSIUM: 4.2 mmol/L (ref 3.5–5.1)
SODIUM: 141 mmol/L (ref 135–145)

## 2018-04-20 LAB — CBC
HEMATOCRIT: 34.1 % — AB (ref 36.0–46.0)
Hemoglobin: 9.7 g/dL — ABNORMAL LOW (ref 12.0–15.0)
MCH: 22.6 pg — ABNORMAL LOW (ref 26.0–34.0)
MCHC: 28.4 g/dL — AB (ref 30.0–36.0)
MCV: 79.3 fL (ref 78.0–100.0)
Platelets: 182 10*3/uL (ref 150–400)
RBC: 4.3 MIL/uL (ref 3.87–5.11)
RDW: 17.2 % — AB (ref 11.5–15.5)
WBC: 9.2 10*3/uL (ref 4.0–10.5)

## 2018-04-20 LAB — I-STAT TROPONIN, ED: Troponin i, poc: 0 ng/mL (ref 0.00–0.08)

## 2018-04-20 NOTE — ED Provider Notes (Signed)
[facility] EMERGENCY DEPARTMENT Provider Note   CSN: [id number] Arrival date & time: 04/20/18  1358     History   Chief Complaint Chief Complaint  Patient presents with  . Chest Pain    HPI Doris Jefferson is a 77 y.o. female.  HPI  Patient w MMP p/w CP, sensation of generalized discomfort.  Onset is unclear, but perhaps a week or so ago.  Since onset the Sx have been persistent, though vague.  Discomfort is seemingly throughout the left upper chest, inconsistent, not clearly exertional, pleuritic or worse with coughing. No syncope, no lightheaded, no fever, no chills, no clear alleviating or exacerbating factors. He notes that symptoms are somewhat similar to prior episodes of bronchitis, though without ongoing respiratory difficulty. She is here with her husband who assists with the HPI.  Past Medical History:  Diagnosis Date  . Anemia   . Diabetes ([place])   . Heart murmur   . HTN (hypertension)   . Hyperlipidemia   . Obesity (BMI 30-39.9) 03/04/2014  . OSA (obstructive sleep apnea)    severe with AHI 31/hr  . PAF (paroxysmal atrial fibrillation) [facility])     Patient Active Problem List   Diagnosis Date Noted  . Iron deficiency anemia, unspecified 11/13/2017  . Visit for monitoring Tikosyn therapy 09/06/2015  . Persistent atrial fibrillation ([place]) 06/23/2014  . Obesity (BMI 30-39.9) 03/04/2014  . HTN (hypertension)   . OSA (obstructive sleep apnea) 12/31/2013  . Anticoagulation goal of INR 2 to 3 11/17/2013  . Moderate aortic stenosis 11/10/2013  . Pulmonary HTN ([place]) 11/10/2013  . Diabetes mellitus ([place]) 11/10/2013    Past Surgical History:  Procedure Laterality Date  . ABDOMINAL HYSTERECTOMY    . CARDIOVERSION N/A 12/19/2013   Procedure: CARDIOVERSION;  Surgeon: Sinclair Grooms, MD;  Location: [facility] ENDOSCOPY;  Service: Cardiovascular;  Laterality: N/A;  . CARDIOVERSION N/A 08/16/2015   Procedure: CARDIOVERSION;  Surgeon: Larey Dresser, MD;   Location: [place];  Service: Cardiovascular;  Laterality: N/A;  . CARDIOVERSION N/A 09/13/2015   Procedure: CARDIOVERSION;  Surgeon: Sueanne Margarita, MD;  Location: [place];  Service: Cardiovascular;  Laterality: N/A;  . COLONOSCOPY WITH PROPOFOL N/A 11/13/2017   Procedure: COLONOSCOPY WITH PROPOFOL;  Surgeon: Wilford Corner, MD;  Location: [facility] ENDOSCOPY;  Service: Endoscopy;  Laterality: N/A;  ANTIBIOTIC NEEDED BEFORE PROCEDURE  . ESOPHAGOGASTRODUODENOSCOPY (EGD) WITH PROPOFOL N/A 11/13/2017   Procedure: ESOPHAGOGASTRODUODENOSCOPY (EGD) WITH PROPOFOL;  Surgeon: Wilford Corner, MD;  Location: [facility] ENDOSCOPY;  Service: Endoscopy;  Laterality: N/A;  . EYE SURGERY    . SPINE SURGERY       OB History   None      Home Medications    Prior to Admission medications   Medication Sig Start Date End Date Taking? Authorizing Provider  acetaminophen (TYLENOL) 325 MG tablet Take 650 mg by mouth every 6 (six) hours as needed for moderate pain or headache.   Yes [provider]  amiodarone (PACERONE) 200 MG tablet TAKE 1 TABLET(200 MG) BY MOUTH DAILY 10/16/17  Yes Belva Crome, MD  buPROPion (WELLBUTRIN XL) 300 MG 24 hr tablet Take 300 mg by mouth daily.  01/11/16  Yes [provider]  diltiazem (CARDIZEM CD) 180 MG 24 hr capsule Take 180 mg by mouth daily. 03/27/17  Yes [provider]  doxylamine, Sleep, (UNISOM) 25 MG tablet Take 25 mg by mouth at bedtime as needed for sleep.   Yes [provider]  GQQPYPP 5  MG TABS tablet TAKE 1 TABLET(5 MG) BY MOUTH TWICE DAILY 11/15/17  Yes Belva Crome, MD  furosemide (LASIX) 20 MG tablet TAKE 1 TABLET BY MOUTH DAILY 10/16/17  Yes Belva Crome, MD  glimepiride (AMARYL) 4 MG tablet Take 4 mg by mouth daily with breakfast.   Yes [provider]  liraglutide (VICTOZA) 18 MG/3ML SOPN Inject 1.8 mg into the skin daily.    Yes [provider]  losartan (COZAAR) 50 MG tablet TAKE 1 TABLET(50 MG) BY MOUTH DAILY  03/18/18  Yes Belva Crome, MD  MAGNESIUM-OXIDE 400 (241.3 Mg) MG tablet TAKE 1 TABLET BY MOUTH TWICE DAILY 09/11/17  Yes Belva Crome, MD  metFORMIN (GLUCOPHAGE) 1000 MG tablet Take 1,000 mg by mouth 2 (two) times daily with a meal.   Yes [provider]  metoprolol succinate (TOPROL-XL) 25 MG 24 hr tablet TAKE 1 TABLET(25 MG) BY MOUTH TWICE DAILY 09/20/17  Yes Belva Crome, MD  rosuvastatin (CRESTOR) 20 MG tablet Take 20 mg by mouth daily.   Yes [provider]  terazosin (HYTRIN) 5 MG capsule Take 5 mg by mouth at bedtime.   Yes [provider]  HYDROcodone-acetaminophen (NORCO/VICODIN) 5-325 MG tablet Take 1 tablet by mouth every 4 (four) hours as needed. Patient not taking: Reported on 04/20/2018 12/10/17   Isla Pence, MD    Family History Family History  Problem Relation Age of Onset  . Hypertension Father   . Heart disease Father   . Melanoma Mother   . Hypertension Brother     Social History Social History   Tobacco Use  . Smoking status: Never Smoker  . Smokeless tobacco: Never Used  Substance Use Topics  . Alcohol use: No  . Drug use: No     Allergies   Minocin [minocycline hcl]; Benazepril; Hydrochlorothiazide; and Tape   Review of Systems Review of Systems  Constitutional:       Per HPI, otherwise negative  HENT:       Per HPI, otherwise negative  Respiratory:       Per HPI, otherwise negative  Cardiovascular:       Per HPI, otherwise negative  Gastrointestinal: Negative for vomiting.  Endocrine:       Negative aside from HPI  Genitourinary:       Neg aside from HPI   Musculoskeletal:       Per HPI, otherwise negative  Skin: Negative.   Neurological: Negative for syncope.     Physical Exam Updated Vital Signs BP (!) 153/44   Pulse 72   Temp 99.8 F (37.7 C) (Oral)   Resp 13   SpO2 99%   Physical Exam  Constitutional: She is oriented to person, place, and time. She appears well-developed and well-nourished.  No distress.  HENT:  Head: Normocephalic and atraumatic.  Eyes: Conjunctivae and EOM are normal.  Cardiovascular: Normal rate and regular rhythm.  Pulmonary/Chest: Effort normal and breath sounds normal. No stridor. No respiratory distress. She has no decreased breath sounds.  Abdominal: She exhibits no distension.  Musculoskeletal: She exhibits no edema.  Neurological: She is alert and oriented to person, place, and time. No cranial nerve deficit.  Skin: Skin is warm and dry.  Psychiatric: She has a normal mood and affect.  Nursing note and vitals reviewed.    ED Treatments / Results  Labs (all labs ordered are listed, but only abnormal results are displayed) Labs Reviewed  BASIC METABOLIC PANEL - Abnormal; Notable for the  following components:      Result Value   Glucose, Bld 185 (*)    Creatinine, Ser 1.09 (*)    GFR calc non Af Amer 48 (*)    GFR calc Af Amer 55 (*)    All other components within normal limits  CBC - Abnormal; Notable for the following components:   Hemoglobin 9.7 (*)    HCT 34.1 (*)    MCH 22.6 (*)    MCHC 28.4 (*)    RDW 17.2 (*)    All other components within normal limits  I-STAT TROPONIN, ED    EKG EKG Interpretation  Date/Time:  Saturday April 20 2018 14:03:12 EDT Ventricular Rate:  76 PR Interval:  168 QRS Duration: 110 QT Interval:  416 QTC Calculation: 468 R Axis:   -60 Text Interpretation:  Normal sinus rhythm Left axis deviation ST-t wave abnormality Artifact Abnormal ekg Confirmed by Carmin Muskrat [phone]) on 04/20/2018 3:48:30 PM   Radiology Dg Chest 2 View  Result Date: 04/20/2018 CLINICAL DATA:  Pt presents for evaluation of L sided chest pain. Pt reports pain started yesterday with L arm pain and progressed to chest. Pt reports mild coughing with green sputum x 2 days. Pt denies fever/chills. EXAM: CHEST - 2 VIEW COMPARISON:  09/07/2017 FINDINGS: The cardiac silhouette is normal in size and configuration. No mediastinal or hilar  masses. No evidence of adenopathy. Clear lungs.  No pleural effusion or pneumothorax. Skeletal structures are intact. There stable changes from an anterior cervical spine fusion. IMPRESSION: No active cardiopulmonary disease. Electronically Signed   By: Lajean Manes M.D.   On: 04/20/2018 14:47    Procedures Procedures (including critical care time)  Medications Ordered in ED Medications - No data to display   Initial Impression / Assessment and Plan / ED Course  I have reviewed the triage vital signs and the nursing notes.  Pertinent labs & imaging results that were available during my care of the patient were reviewed by me and considered in my medical decision making (see chart for details).   This patient presented with chest pain.  The evaluation here has been reassuring, with no evidence of ongoing coronary ischemia, infection or other acute new pathology.  Vitals and labs are consistent with the reassuring physical exam.  The patient is appropriate for further evaluation and management as an outpatient.  Final Clinical Impressions(s) / ED Diagnoses   Final diagnoses:  Atypical chest pain     Carmin Muskrat, MD 04/20/18 1702

## 2018-04-20 NOTE — Discharge Instructions (Addendum)
As discussed, your evaluation today has been largely reassuring.  But, it is important that you monitor your condition carefully, and do not hesitate to return to the ED if you develop new, or concerning changes in your condition. ? ?Otherwise, please follow-up with your physician for appropriate ongoing care. ? ?

## 2018-04-20 NOTE — ED Triage Notes (Signed)
Pt presents for evaluation of L sided chest pain. Pt reports pain started yesterday with L arm pain and progressed to chest. Pt reports mild coughing with green sputum x 2 days. Pt denies fever/chills.

## 2018-05-03 ENCOUNTER — Telehealth: Payer: Self-pay | Admitting: Oncology

## 2018-05-03 ENCOUNTER — Inpatient Hospital Stay: Payer: Medicare Other | Attending: Oncology | Admitting: Oncology

## 2018-05-03 ENCOUNTER — Inpatient Hospital Stay: Payer: Medicare Other

## 2018-05-03 VITALS — BP 138/35 | HR 80 | Temp 98.7°F | Resp 16 | Ht 63.0 in | Wt 212.1 lb

## 2018-05-03 DIAGNOSIS — D509 Iron deficiency anemia, unspecified: Secondary | ICD-10-CM | POA: Diagnosis present

## 2018-05-03 DIAGNOSIS — I10 Essential (primary) hypertension: Secondary | ICD-10-CM | POA: Diagnosis not present

## 2018-05-03 DIAGNOSIS — E119 Type 2 diabetes mellitus without complications: Secondary | ICD-10-CM | POA: Diagnosis not present

## 2018-05-03 LAB — IRON AND TIBC
Iron: 47 ug/dL (ref 41–142)
Saturation Ratios: 13 % — ABNORMAL LOW (ref 21–57)
TIBC: 361 ug/dL (ref 236–444)
UIBC: 314 ug/dL

## 2018-05-03 LAB — CBC WITH DIFFERENTIAL (CANCER CENTER ONLY)
Basophils Absolute: 0.1 10*3/uL (ref 0.0–0.1)
Basophils Relative: 1 %
EOS ABS: 0.2 10*3/uL (ref 0.0–0.5)
Eosinophils Relative: 2 %
HCT: 31.7 % — ABNORMAL LOW (ref 34.8–46.6)
Hemoglobin: 9.8 g/dL — ABNORMAL LOW (ref 11.6–15.9)
LYMPHS ABS: 2.1 10*3/uL (ref 0.9–3.3)
Lymphocytes Relative: 18 %
MCH: 22.8 pg — ABNORMAL LOW (ref 25.1–34.0)
MCHC: 30.9 g/dL — ABNORMAL LOW (ref 31.5–36.0)
MCV: 73.6 fL — ABNORMAL LOW (ref 79.5–101.0)
Monocytes Absolute: 0.9 10*3/uL (ref 0.1–0.9)
Monocytes Relative: 8 %
NEUTROS ABS: 8.5 10*3/uL — AB (ref 1.5–6.5)
Neutrophils Relative %: 71 %
Platelet Count: 180 10*3/uL (ref 145–400)
RBC: 4.3 MIL/uL (ref 3.70–5.45)
RDW: 18.7 % — ABNORMAL HIGH (ref 11.2–14.5)
WBC Count: 11.8 10*3/uL — ABNORMAL HIGH (ref 3.9–10.3)

## 2018-05-03 LAB — FERRITIN: FERRITIN: 23 ng/mL (ref 11–307)

## 2018-05-03 NOTE — Telephone Encounter (Signed)
Appointments scheduled AVS/Calendar printed per 7/26 los °

## 2018-05-03 NOTE — Progress Notes (Signed)
Reason for the request: Iron deficiency anemia  HPI: I was asked by Dr. Michail Sermon to evaluate Doris Jefferson for iron deficiency anemia.  She is a 77 year old woman with history of hypertension, diabetes and hyperlipidemia.  She was found to have anemia in May 2019 with a hemoglobin of 9.8 and MCV of 75.7.  Her RDW was elevated at 18.4.  She had an extensive GI work-up with Dr. Michail Sermon including endoscopy and colonoscopy as well as capsule endoscopy.  There is no clear-cut source of GI bleeding.  She was seen in the emergency department on April 20, 2018 for chest pain.  At that time chest x-ray did not show any abnormalities.  Repeat CBC showed a hemoglobin of 9.7, MCV of 79 count of 182.  Her last colonoscopy and endoscopy was done in February 2019 and duodenal biopsy showed no abnormalities in the duodenum or the stomach.  Tubular adenoma of the colon was negative for malignancy.  He reports that she has intermittent iron deficiency anemia for many years and has been on oral iron replacement that has been unsuccessful in the past.  She did require intramuscular IV iron injections as well.  Clinically, she reports her symptoms of excessive fatigue and occasional dizziness and tiredness.  She denies any chest pain or shortness of breath.  She does not report any headaches, blurry vision, syncope or seizures. Does not report any fevers, chills or sweats.  Does not report any cough, wheezing or hemoptysis.  Does not report any chest pain, palpitation, orthopnea or leg edema.  Does not report any nausea, vomiting or abdominal pain.  Does not report any constipation or diarrhea.  Does not report any skeletal complaints.    Does not report frequency, urgency or hematuria.  Does not report any skin rashes or lesions. Does not report any heat or cold intolerance.  Does not report any lymphadenopathy or petechiae.  Does not report any anxiety or depression.  Remaining review of systems is negative.    Past Medical  History:  Diagnosis Date  . Anemia   . Diabetes ([place])   . Heart murmur   . HTN (hypertension)   . Hyperlipidemia   . Obesity (BMI 30-39.9) 03/04/2014  . OSA (obstructive sleep apnea)    severe with AHI 31/hr  . PAF (paroxysmal atrial fibrillation) ([place])   :  Past Surgical History:  Procedure Laterality Date  . ABDOMINAL HYSTERECTOMY    . CARDIOVERSION N/A 12/19/2013   Procedure: CARDIOVERSION;  Surgeon: Sinclair Grooms, MD;  Location: [facility] ENDOSCOPY;  Service: Cardiovascular;  Laterality: N/A;  . CARDIOVERSION N/A 08/16/2015   Procedure: CARDIOVERSION;  Surgeon: Larey Dresser, MD;  Location: [place];  Service: Cardiovascular;  Laterality: N/A;  . CARDIOVERSION N/A 09/13/2015   Procedure: CARDIOVERSION;  Surgeon: Sueanne Margarita, MD;  Location: [place];  Service: Cardiovascular;  Laterality: N/A;  . COLONOSCOPY WITH PROPOFOL N/A 11/13/2017   Procedure: COLONOSCOPY WITH PROPOFOL;  Surgeon: Wilford Corner, MD;  Location: [facility] ENDOSCOPY;  Service: Endoscopy;  Laterality: N/A;  ANTIBIOTIC NEEDED BEFORE PROCEDURE  . ESOPHAGOGASTRODUODENOSCOPY (EGD) WITH PROPOFOL N/A 11/13/2017   Procedure: ESOPHAGOGASTRODUODENOSCOPY (EGD) WITH PROPOFOL;  Surgeon: Wilford Corner, MD;  Location: [facility] ENDOSCOPY;  Service: Endoscopy;  Laterality: N/A;  . EYE SURGERY    . SPINE SURGERY    :   Current Outpatient Medications:  .  acetaminophen (TYLENOL) 325 MG tablet, Take 650 mg by mouth every 6 (six) hours as needed for moderate pain or headache., Disp: ,  Rfl:  .  amiodarone (PACERONE) 200 MG tablet, TAKE 1 TABLET(200 MG) BY MOUTH DAILY, Disp: 90 tablet, Rfl: 3 .  buPROPion (WELLBUTRIN XL) 300 MG 24 hr tablet, Take 300 mg by mouth daily. , Disp: , Rfl:  .  diltiazem (CARDIZEM CD) 180 MG 24 hr capsule, Take 180 mg by mouth daily., Disp: , Rfl:  .  doxylamine, Sleep, (UNISOM) 25 MG tablet, Take 25 mg by mouth at bedtime as needed for sleep., Disp: , Rfl:  .  ELIQUIS 5 MG TABS tablet, TAKE 1 TABLET(5 MG)  BY MOUTH TWICE DAILY, Disp: 60 tablet, Rfl: 6 .  furosemide (LASIX) 20 MG tablet, TAKE 1 TABLET BY MOUTH DAILY, Disp: 90 tablet, Rfl: 3 .  glimepiride (AMARYL) 4 MG tablet, Take 4 mg by mouth daily with breakfast., Disp: , Rfl:  .  HYDROcodone-acetaminophen (NORCO/VICODIN) 5-325 MG tablet, Take 1 tablet by mouth every 4 (four) hours as needed. (Patient not taking: Reported on 04/20/2018), Disp: 10 tablet, Rfl: 0 .  liraglutide (VICTOZA) 18 MG/3ML SOPN, Inject 1.8 mg into the skin daily. , Disp: , Rfl:  .  losartan (COZAAR) 50 MG tablet, TAKE 1 TABLET(50 MG) BY MOUTH DAILY, Disp: 90 tablet, Rfl: 1 .  MAGNESIUM-OXIDE 400 (241.3 Mg) MG tablet, TAKE 1 TABLET BY MOUTH TWICE DAILY, Disp: 60 tablet, Rfl: 8 .  metFORMIN (GLUCOPHAGE) 1000 MG tablet, Take 1,000 mg by mouth 2 (two) times daily with a meal., Disp: , Rfl:  .  metoprolol succinate (TOPROL-XL) 25 MG 24 hr tablet, TAKE 1 TABLET(25 MG) BY MOUTH TWICE DAILY, Disp: 180 tablet, Rfl: 2 .  rosuvastatin (CRESTOR) 20 MG tablet, Take 20 mg by mouth daily., Disp: , Rfl:  .  terazosin (HYTRIN) 5 MG capsule, Take 5 mg by mouth at bedtime., Disp: , Rfl: :  Allergies  Allergen Reactions  . Minocin [Minocycline Hcl] Swelling and Other (See Comments)    Throat swelling  . Benazepril Cough  . Hydrochlorothiazide Itching  . Tape Rash  :  Family History  Problem Relation Age of Onset  . Hypertension Father   . Heart disease Father   . Melanoma Mother   . Hypertension Brother   :  Social History   Socioeconomic History  . Marital status: Married    Spouse name: Not on file  . Number of children: Not on file  . Years of education: Not on file  . Highest education level: Not on file  Occupational History  . Not on file  Social Needs  . Financial resource strain: Not on file  . Food insecurity:    Worry: Not on file    Inability: Not on file  . Transportation needs:    Medical: Not on file    Non-medical: Not on file  Tobacco Use  . Smoking  status: Never Smoker  . Smokeless tobacco: Never Used  Substance and Sexual Activity  . Alcohol use: No  . Drug use: No  . Sexual activity: Never  Lifestyle  . Physical activity:    Days per week: Not on file    Minutes per session: Not on file  . Stress: Not on file  Relationships  . Social connections:    Talks on phone: Not on file    Gets together: Not on file    Attends religious service: Not on file    Active member of club or organization: Not on file    Attends meetings of clubs or organizations: Not on file  Relationship status: Not on file  . Intimate partner violence:    Fear of current or ex partner: Not on file    Emotionally abused: Not on file    Physically abused: Not on file    Forced sexual activity: Not on file  Other Topics Concern  . Not on file  Social History Narrative  . Not on file  :  Pertinent items are noted in HPI.  Exam: Blood pressure (!) 138/35, pulse 80, temperature 98.7 F (37.1 C), temperature source Oral, resp. rate 16, height 5\' 3"  (1.6 m), weight 212 lb 1.6 oz (96.2 kg), SpO2 98 %.   General appearance: alert and cooperative appeared without distress. Head: atraumatic without any abnormalities. Eyes: conjunctivae/corneas clear. PERRL.  Sclera anicteric. Throat: lips, mucosa, and tongue normal; without oral thrush or ulcers. Resp: clear to auscultation bilaterally without rhonchi, wheezes or dullness to percussion. Cardio: regular rate and rhythm, S1, S2.  Systolic ejection murmur noted. GI: soft, non-tender; bowel sounds normal; no masses,  no organomegaly Skin: Skin color, texture, turgor normal. No rashes or lesions Lymph nodes: Cervical, supraclavicular, and axillary nodes normal. Neurologic: Grossly normal without any motor, sensory or deep tendon reflexes. Musculoskeletal: No joint deformity or effusion.  CBC    Component Value Date/Time   WBC 9.2 04/20/2018 1412   RBC 4.30 04/20/2018 1412   HGB 9.7 (L) 04/20/2018 1412    HGB 9.3 (L) 11/22/2017 0922   HCT 34.1 (L) 04/20/2018 1412   HCT 30.3 (L) 11/22/2017 0922   PLT 182 04/20/2018 1412   PLT 202 11/22/2017 0922   MCV 79.3 04/20/2018 1412   MCV 82 11/22/2017 0922   MCH 22.6 (L) 04/20/2018 1412   MCHC 28.4 (L) 04/20/2018 1412   RDW 17.2 (H) 04/20/2018 1412   RDW 15.6 (H) 11/22/2017 0922   LYMPHSABS 1.7 12/10/2017 1639   LYMPHSABS 2.0 09/21/2017 0830   MONOABS 0.6 12/10/2017 1639   EOSABS 0.0 12/10/2017 1639   EOSABS 0.2 09/21/2017 0830   BASOSABS 0.0 12/10/2017 1639   BASOSABS 0.0 09/21/2017 0830     Dg Chest 2 View  Result Date: 04/20/2018 CLINICAL DATA:  Pt presents for evaluation of L sided chest pain. Pt reports pain started yesterday with L arm pain and progressed to chest. Pt reports mild coughing with green sputum x 2 days. Pt denies fever/chills. EXAM: CHEST - 2 VIEW COMPARISON:  09/07/2017 FINDINGS: The cardiac silhouette is normal in size and configuration. No mediastinal or hilar masses. No evidence of adenopathy. Clear lungs.  No pleural effusion or pneumothorax. Skeletal structures are intact. There stable changes from an anterior cervical spine fusion. IMPRESSION: No active cardiopulmonary disease. Electronically Signed   By: Lajean Manes M.D.   On: 04/20/2018 14:47    Assessment and Plan:   77 year old woman with the following:  1.  Iron deficiency anemia documented on multiple occasions with a negative GI work-up completed this year including colonoscopy, endoscopy and capsule endoscopy.  Her most recent hemoglobin was 9.7 low MCV and elevated RDW.  The differential diagnosis was discussed today with the patient.  Her iron deficiency is likely related to poor absorption and potential microscopic blood losses related to her full dose anticoagulation.  Treatment options were reviewed today which includes oral iron replacement versus intravenous iron intermittently.  Risks and benefits of Feraheme infusion was reviewed today.   Complications include arthralgias, myalgias and rarely anaphylaxis was discussed.  After discussion today, she is opted to proceed with intravenous iron.  We will repeat iron studies today document iron deficiency.  Intravenous iron will be given in the form of Feraheme for a total of 1000 mg in split doses.  We will repeat iron studies in 6 months to ensure stability.  2.  Follow-up: We will be in 6 months to follow her progress.  30  minutes was spent with the patient face-to-face today.  More than 50% of time was dedicated to patient counseling, education and coordination of her care.   Thank you for the referral.  A copy of this consult has been forwarded to the requesting physician.

## 2018-05-08 ENCOUNTER — Encounter: Payer: Self-pay | Admitting: Nurse Practitioner

## 2018-05-08 ENCOUNTER — Ambulatory Visit (INDEPENDENT_AMBULATORY_CARE_PROVIDER_SITE_OTHER): Payer: Medicare Other | Admitting: Nurse Practitioner

## 2018-05-08 VITALS — BP 150/60 | HR 73 | Ht 63.0 in | Wt 211.8 lb

## 2018-05-08 DIAGNOSIS — I350 Nonrheumatic aortic (valve) stenosis: Secondary | ICD-10-CM

## 2018-05-08 DIAGNOSIS — I481 Persistent atrial fibrillation: Secondary | ICD-10-CM | POA: Diagnosis not present

## 2018-05-08 DIAGNOSIS — I10 Essential (primary) hypertension: Secondary | ICD-10-CM

## 2018-05-08 DIAGNOSIS — I4819 Other persistent atrial fibrillation: Secondary | ICD-10-CM

## 2018-05-08 DIAGNOSIS — E7849 Other hyperlipidemia: Secondary | ICD-10-CM

## 2018-05-08 DIAGNOSIS — Z79899 Other long term (current) drug therapy: Secondary | ICD-10-CM

## 2018-05-08 DIAGNOSIS — R0789 Other chest pain: Secondary | ICD-10-CM | POA: Diagnosis not present

## 2018-05-08 LAB — HEPATIC FUNCTION PANEL
ALT: 35 IU/L — ABNORMAL HIGH (ref 0–32)
AST: 33 IU/L (ref 0–40)
Albumin: 3.7 g/dL (ref 3.5–4.8)
Alkaline Phosphatase: 92 IU/L (ref 39–117)
Bilirubin Total: 0.5 mg/dL (ref 0.0–1.2)
Bilirubin, Direct: 0.2 mg/dL (ref 0.00–0.40)
Total Protein: 6 g/dL (ref 6.0–8.5)

## 2018-05-08 LAB — LIPID PANEL
Chol/HDL Ratio: 3.4 ratio (ref 0.0–4.4)
Cholesterol, Total: 145 mg/dL (ref 100–199)
HDL: 43 mg/dL (ref >39)
LDL Calculated: 72 mg/dL (ref 0–99)
Triglycerides: 152 mg/dL — ABNORMAL HIGH (ref 0–149)
VLDL Cholesterol Cal: 30 mg/dL (ref 5–40)

## 2018-05-08 LAB — TSH: TSH: 3 u[IU]/mL (ref 0.450–4.500)

## 2018-05-08 NOTE — Progress Notes (Signed)
CARDIOLOGY OFFICE NOTE  Date:  05/08/2018    Doris Jefferson Date of Birth: 07/18/1941 Medical Record #[id number]  PCP:  [name]  Cardiologist:  [place]    Chief Complaint  Patient presents with  . Chest Pain  . Atrial Fibrillation    Post ER visit - seen for Dr. Tamala Julian    History of Present Illness: [name] is a 77 y.o. female who presents today for a work in visit. Seen for Dr. Tamala Julian.   She has a history of persistent afib s/p hospitalization for tikosyn load. She had significant QT prolongation on 09/08/15 and torsades that required CPR/defibrillation.Ultimately started on amiodarone and maintaining normal sinus rhythm. Other issues include DM, HTN, AS, OSA, obesity and HLD.   Last seen by Dr. Tamala Julian in December of 2018 - felt to be doing well. Saw Dr. Radford Pax in February for her OSA/CPAP.   In the ER earlier this month - atypical chest pain - negative evaluation.   She has been referred to hematology for an iron deficiency anemia. She had had prior negative GI work up with Dr. Michail Sermon.   Comes in today. Here alone. She was found to have shingles after leaving the ER. This correlated with her chest pain. Now resolved and no more pain/blisters. She has had a bout of vertigo prior to that. Now she is "ok".  No more chest pain. Her breathing is ok - she has had some cough - especially after being outside. No syncope. She now feels like she is doing well and has no concerns. CXR from the ER visit was ok.   Past Medical History:  Diagnosis Date  . Anemia   . Diabetes ([place])   . Heart murmur   . HTN (hypertension)   . Hyperlipidemia   . Obesity (BMI 30-39.9) 03/04/2014  . OSA (obstructive sleep apnea)    severe with AHI 31/hr  . PAF (paroxysmal atrial fibrillation) ([place])     Past Surgical History:  Procedure Laterality Date  . ABDOMINAL HYSTERECTOMY    . CARDIOVERSION N/A 12/19/2013   Procedure: CARDIOVERSION;   Surgeon: Sinclair Grooms, MD;  Location: [facility] ENDOSCOPY;  Service: Cardiovascular;  Laterality: N/A;  . CARDIOVERSION N/A 08/16/2015   Procedure: CARDIOVERSION;  Surgeon: Larey Dresser, MD;  Location: [place];  Service: Cardiovascular;  Laterality: N/A;  . CARDIOVERSION N/A 09/13/2015   Procedure: CARDIOVERSION;  Surgeon: Sueanne Margarita, MD;  Location: [place];  Service: Cardiovascular;  Laterality: N/A;  . COLONOSCOPY WITH PROPOFOL N/A 11/13/2017   Procedure: COLONOSCOPY WITH PROPOFOL;  Surgeon: Wilford Corner, MD;  Location: [facility] ENDOSCOPY;  Service: Endoscopy;  Laterality: N/A;  ANTIBIOTIC NEEDED BEFORE PROCEDURE  . ESOPHAGOGASTRODUODENOSCOPY (EGD) WITH PROPOFOL N/A 11/13/2017   Procedure: ESOPHAGOGASTRODUODENOSCOPY (EGD) WITH PROPOFOL;  Surgeon: Wilford Corner, MD;  Location: [facility] ENDOSCOPY;  Service: Endoscopy;  Laterality: N/A;  . EYE SURGERY    . SPINE SURGERY       Medications: Current Meds  Medication Sig  . acetaminophen (TYLENOL) 325 MG tablet Take 650 mg by mouth every 6 (six) hours as needed for moderate pain or headache.  [name] amiodarone (PACERONE) 200 MG tablet TAKE 1 TABLET(200 MG) BY MOUTH DAILY  . buPROPion (WELLBUTRIN XL) 300 MG 24 hr tablet Take 300 mg by mouth daily.   [name] diltiazem (CARDIZEM CD) 180 MG 24 hr capsule Take 180 mg by mouth daily.  [name] doxylamine, Sleep, (UNISOM) 25 MG tablet Take 25  mg by mouth at bedtime as needed for sleep.  [name] ELIQUIS 5 MG TABS tablet TAKE 1 TABLET(5 MG) BY MOUTH TWICE DAILY  . fexofenadine (ALLEGRA) 60 MG tablet Take 60 mg by mouth daily.  . furosemide (LASIX) 20 MG tablet TAKE 1 TABLET BY MOUTH DAILY  . glimepiride (AMARYL) 4 MG tablet Take 4 mg by mouth daily with breakfast.  . liraglutide (VICTOZA) 18 MG/3ML SOPN Inject 1.8 mg into the skin daily.   [name] losartan (COZAAR) 50 MG tablet TAKE 1 TABLET(50 MG) BY MOUTH DAILY  . MAGNESIUM-OXIDE 400 (241.3 Mg) MG tablet TAKE 1 TABLET BY MOUTH TWICE DAILY  . metFORMIN (GLUCOPHAGE) 1000 MG  tablet Take 1,000 mg by mouth 2 (two) times daily with a meal.  . metoprolol succinate (TOPROL-XL) 25 MG 24 hr tablet TAKE 1 TABLET(25 MG) BY MOUTH TWICE DAILY  . rosuvastatin (CRESTOR) 20 MG tablet Take 20 mg by mouth daily.  [name] terazosin (HYTRIN) 5 MG capsule Take 5 mg by mouth at bedtime.     Allergies: Allergies  Allergen Reactions  . Minocin [Minocycline Hcl] Swelling and Other (See Comments)    THROAT SWELLING  . Benazepril Cough  . Hydrochlorothiazide Itching  . Tape Rash    Social History: The patient  reports that she has never smoked. She has never used smokeless tobacco. She reports that she does not drink alcohol or use drugs.   Family History: The patient's family history includes Heart disease in her father; Hypertension in her brother and father; Melanoma in her mother.   Review of Systems: Please see the history of present illness.   Otherwise, the review of systems is positive for none.   All other systems are reviewed and negative.   Physical Exam: VS:  BP (!) 150/60 (BP Location: Left Arm, Patient Position: Sitting, Cuff Size: Normal)   Pulse 73   Ht 5\' 3"  (1.6 m)   Wt 211 lb 12.8 oz (96.1 kg)   SpO2 98% Comment: at rest  BMI 37.52 kg/m  .  BMI Body mass index is 37.52 kg/m.  Wt Readings from Last 3 Encounters:  05/08/18 211 lb 12.8 oz (96.1 kg)  05/03/18 212 lb 1.6 oz (96.2 kg)  12/10/17 220 lb (99.8 kg)   Repeat BP by me is 130/60.  General: Pleasant. Elderly, obese female who is alert and in no acute distress.  Chronically pale in appearance.  HEENT: Normal.  Neck: Supple, no JVD, carotid bruits, or masses noted.  Cardiac: Regular rate and rhythm. Harsh outflow murmur. No edema.  Respiratory:  Lungs are clear to auscultation bilaterally with normal work of breathing.  GI: Soft and nontender.  MS: No deformity or atrophy. Gait and ROM intact.  Skin: Warm and dry. Color is pale.  Neuro:  Strength and sensation are intact and no gross focal  deficits noted.  Psych: Alert, appropriate and with normal affect.   LABORATORY DATA:  EKG:  EKG is not ordered today.  Lab Results  Component Value Date   WBC 11.8 (H) 05/03/2018   HGB 9.8 (L) 05/03/2018   HCT 31.7 (L) 05/03/2018   PLT 180 05/03/2018   GLUCOSE 185 (H) 04/20/2018   ALT 41 (H) 04/01/2018   AST 67 (H) 04/01/2018   NA 141 04/20/2018   K 4.2 04/20/2018   CL 108 04/20/2018   CREATININE 1.09 (H) 04/20/2018   BUN 13 04/20/2018   CO2 22 04/20/2018   TSH 3.150 09/27/2017   INR 1.37 12/10/2017  BNP (last 3 results) No results for input(s): BNP in the last 8760 hours.  ProBNP (last 3 results) No results for input(s): PROBNP in the last 8760 hours.   Other Studies Reviewed Today:   CXR FINDINGS 04/2018: The cardiac silhouette is normal in size and configuration. No mediastinal or hilar masses. No evidence of adenopathy.  Clear lungs.  No pleural effusion or pneumothorax.  Skeletal structures are intact. There stable changes from an anterior cervical spine fusion.  IMPRESSION: No active cardiopulmonary disease.   Electronically Signed   By: Lajean Manes M.D.   On: 04/20/2018 14:47    2D Doppler echocardiogram 09/21/2017:  Study Conclusions  - Left ventricle: The cavity size was normal. Wall thickness was increased in a pattern of mild LVH. Systolic function was normal. The estimated ejection fraction was in the range of 55% to 60%. Wall motion was normal; there were no regional wall motion abnormalities. Features are consistent with a pseudonormal left ventricular filling pattern, with concomitant abnormal relaxation and increased filling pressure (grade 2 diastolic dysfunction). - Aortic valve: Moderately calcified annulus. Moderately thickened, moderately calcified leaflets. 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 - Left atrium: The atrium was mildly dilated. - Pulmonary arteries: Systolic pressure was moderately  increased. PA peak pressure: 60 mm Hg (S).   Assessment & Plan:  1. Chest pain - resolved - more than likely this was from shingles. This has not recurred.   2. Moderate AS - no cardinal symptoms - she is reminded to what to be looking for.   3. Persistent AF - remains in sinus by exam. Remains on amiodarone.   4. High risk medicine - needs repeat LFTs and TSH today as her surveillance labs. She has had CXR as well.   5. HTN - recheck by me is fine. No changes made.   6. Chronic anticoagulation - no problems noted.   7. OSA - followed by Dr. Radford Pax  8. Iron deficiency anemia - she will be starting IV iron infusions.   9. HLD - on statin - no lipids noted - will check today along with LFTs.   Current medicines are reviewed with the patient today.  The patient does not have concerns regarding medicines other than what has been noted above.  The following changes have been made:  See above.  Labs/ tests ordered today include:    Orders Placed This Encounter  Procedures  . Lipid panel  . Hepatic function panel  . TSH  . EKG 12-Lead     Disposition:   FU with Dr. Tamala Julian as planned in December.   Patient is agreeable to this plan and will call if any problems develop in the interim.   SignedTruitt Merle, NP  05/08/2018 8:18 AM  [facility] [address] [place], [place]  [address] Phone: [phone] Fax: [phone]

## 2018-05-08 NOTE — Patient Instructions (Addendum)
We will be checking the following labs today - LFTs and TSh   Medication Instructions:    Continue with your current medicines.     Testing/Procedures To Be Arranged:  N/A  Follow-Up:   See Dr. Tamala Julian in December as planned    Other Special Instructions:   N/A    If you need a refill on your cardiac medications before your next appointment, please call your pharmacy.   Call the [place] office at [phone] if you have any questions, problems or concerns.

## 2018-05-10 ENCOUNTER — Inpatient Hospital Stay: Payer: Medicare Other | Attending: Oncology

## 2018-05-10 VITALS — BP 131/41 | HR 78 | Temp 98.7°F | Resp 16

## 2018-05-10 DIAGNOSIS — D508 Other iron deficiency anemias: Secondary | ICD-10-CM

## 2018-05-10 DIAGNOSIS — D509 Iron deficiency anemia, unspecified: Secondary | ICD-10-CM | POA: Diagnosis present

## 2018-05-10 MED ORDER — FERUMOXYTOL INJECTION 510 MG/17 ML
510.0000 mg | Freq: Once | INTRAVENOUS | Status: AC
  Administered 2018-05-10: 510 mg via INTRAVENOUS
  Filled 2018-05-10: qty 17

## 2018-05-10 MED ORDER — SODIUM CHLORIDE 0.9 % IV SOLN
Freq: Once | INTRAVENOUS | Status: AC
  Administered 2018-05-10: 14:00:00 via INTRAVENOUS
  Filled 2018-05-10: qty 250

## 2018-05-17 ENCOUNTER — Inpatient Hospital Stay: Payer: Medicare Other

## 2018-05-17 VITALS — BP 140/49 | HR 64 | Temp 98.9°F | Resp 16

## 2018-05-17 DIAGNOSIS — D509 Iron deficiency anemia, unspecified: Secondary | ICD-10-CM | POA: Diagnosis not present

## 2018-05-17 DIAGNOSIS — D508 Other iron deficiency anemias: Secondary | ICD-10-CM

## 2018-05-17 MED ORDER — FERUMOXYTOL INJECTION 510 MG/17 ML
510.0000 mg | Freq: Once | INTRAVENOUS | Status: AC
  Administered 2018-05-17: 510 mg via INTRAVENOUS
  Filled 2018-05-17: qty 17

## 2018-05-17 MED ORDER — SODIUM CHLORIDE 0.9 % IV SOLN
Freq: Once | INTRAVENOUS | Status: AC
  Administered 2018-05-17: 15:00:00 via INTRAVENOUS
  Filled 2018-05-17: qty 250

## 2018-05-20 ENCOUNTER — Other Ambulatory Visit: Payer: Self-pay | Admitting: Interventional Cardiology

## 2018-05-20 NOTE — Telephone Encounter (Signed)
Pt is a 77 yr old female who saw Tera Helper., NP on 05/08/18. Weight at that visit was 96.1Kg. Last noted SCr is 1.09 from 04/20/18. Will refill Eliquis 5mg  BID.

## 2018-05-27 ENCOUNTER — Telehealth: Payer: Self-pay | Admitting: *Deleted

## 2018-05-27 ENCOUNTER — Ambulatory Visit (INDEPENDENT_AMBULATORY_CARE_PROVIDER_SITE_OTHER): Payer: Medicare Other | Admitting: Cardiology

## 2018-05-27 ENCOUNTER — Encounter: Payer: Self-pay | Admitting: Cardiology

## 2018-05-27 VITALS — BP 126/72 | HR 76 | Ht 63.0 in | Wt 211.0 lb

## 2018-05-27 DIAGNOSIS — G4733 Obstructive sleep apnea (adult) (pediatric): Secondary | ICD-10-CM | POA: Diagnosis not present

## 2018-05-27 DIAGNOSIS — I10 Essential (primary) hypertension: Secondary | ICD-10-CM

## 2018-05-27 DIAGNOSIS — E669 Obesity, unspecified: Secondary | ICD-10-CM | POA: Diagnosis not present

## 2018-05-27 NOTE — Progress Notes (Signed)
Cardiology Office Note:    Date:  05/27/2018   ID:  Doris Jefferson, DOB [birth year], MRN [id number]  PCP:  [name]  Cardiologist:  Sinclair Grooms, MD    Referring MD: Josefa Half*   Chief Complaint  Patient presents with  . Sleep Apnea  . Hypertension    History of Present Illness:    Doris Jefferson is a 77 y.o. female with a hx of  PAF, pulmonary HTN, HTN, obesity and OSA on CPAP.  She is doing well with her CPAP device.  She tolerates the mask and feels the pressure is adequate.  Since going on CPAP she feels rested in the am and has no significant daytime sleepiness.  She denies any significant mouth or nasal dryness or nasal congestion.  She does not think that he snores.     Past Medical History:  Diagnosis Date  . Anemia   . Diabetes ([place])   . Heart murmur   . HTN (hypertension)   . Hyperlipidemia   . Obesity (BMI 30-39.9) 03/04/2014  . OSA (obstructive sleep apnea)    severe with AHI 31/hr  . PAF (paroxysmal atrial fibrillation) ([place])     Past Surgical History:  Procedure Laterality Date  . ABDOMINAL HYSTERECTOMY    . CARDIOVERSION N/A 12/19/2013   Procedure: CARDIOVERSION;  Surgeon: Sinclair Grooms, MD;  Location: [facility] ENDOSCOPY;  Service: Cardiovascular;  Laterality: N/A;  . CARDIOVERSION N/A 08/16/2015   Procedure: CARDIOVERSION;  Surgeon: Larey Dresser, MD;  Location: [place];  Service: Cardiovascular;  Laterality: N/A;  . CARDIOVERSION N/A 09/13/2015   Procedure: CARDIOVERSION;  Surgeon: Sueanne Margarita, MD;  Location: [place];  Service: Cardiovascular;  Laterality: N/A;  . COLONOSCOPY WITH PROPOFOL N/A 11/13/2017   Procedure: COLONOSCOPY WITH PROPOFOL;  Surgeon: Wilford Corner, MD;  Location: [facility] ENDOSCOPY;  Service: Endoscopy;  Laterality: N/A;  ANTIBIOTIC NEEDED BEFORE PROCEDURE  . ESOPHAGOGASTRODUODENOSCOPY (EGD) WITH PROPOFOL N/A 11/13/2017   Procedure: ESOPHAGOGASTRODUODENOSCOPY (EGD) WITH PROPOFOL;   Surgeon: Wilford Corner, MD;  Location: [facility] ENDOSCOPY;  Service: Endoscopy;  Laterality: N/A;  . EYE SURGERY    . SPINE SURGERY      Current Medications: Current Meds  Medication Sig  . acetaminophen (TYLENOL) 325 MG tablet Take 650 mg by mouth every 6 (six) hours as needed for moderate pain or headache.  [name] amiodarone (PACERONE) 200 MG tablet TAKE 1 TABLET(200 MG) BY MOUTH DAILY  . buPROPion (WELLBUTRIN XL) 300 MG 24 hr tablet Take 300 mg by mouth daily.   [name] diltiazem (CARDIZEM CD) 180 MG 24 hr capsule Take 180 mg by mouth daily.  [name] doxylamine, Sleep, (UNISOM) 25 MG tablet Take 25 mg by mouth at bedtime as needed for sleep.  [name] ELIQUIS 5 MG TABS tablet TAKE 1 TABLET(5 MG) BY MOUTH TWICE DAILY  . fexofenadine (ALLEGRA) 60 MG tablet Take 60 mg by mouth daily.  . furosemide (LASIX) 20 MG tablet TAKE 1 TABLET BY MOUTH DAILY  . glimepiride (AMARYL) 4 MG tablet Take 4 mg by mouth daily with breakfast.  . liraglutide (VICTOZA) 18 MG/3ML SOPN Inject 1.8 mg into the skin daily.   [name] losartan (COZAAR) 50 MG tablet TAKE 1 TABLET(50 MG) BY MOUTH DAILY  . MAGNESIUM-OXIDE 400 (241.3 Mg) MG tablet TAKE 1 TABLET BY MOUTH TWICE DAILY  . metFORMIN (GLUCOPHAGE) 1000 MG tablet Take 1,000 mg by mouth 2 (two) times daily with a meal.  . metoprolol succinate (TOPROL-XL) 25 MG  24 hr tablet TAKE 1 TABLET(25 MG) BY MOUTH TWICE DAILY  . rosuvastatin (CRESTOR) 20 MG tablet Take 20 mg by mouth daily.  [name] terazosin (HYTRIN) 5 MG capsule Take 5 mg by mouth at bedtime.     Allergies:   Minocin [minocycline hcl]; Benazepril; Hydrochlorothiazide; and Tape   Social History   Socioeconomic History  . Marital status: Married    Spouse name: Not on file  . Number of children: Not on file  . Years of education: Not on file  . Highest education level: Not on file  Occupational History  . Not on file  Social Needs  . Financial resource strain: Not on file  . Food insecurity:    Worry: Not on file    Inability: Not  on file  . Transportation needs:    Medical: Not on file    Non-medical: Not on file  Tobacco Use  . Smoking status: Never Smoker  . Smokeless tobacco: Never Used  Substance and Sexual Activity  . Alcohol use: No  . Drug use: No  . Sexual activity: Never  Lifestyle  . Physical activity:    Days per week: Not on file    Minutes per session: Not on file  . Stress: Not on file  Relationships  . Social connections:    Talks on phone: Not on file    Gets together: Not on file    Attends religious service: Not on file    Active member of club or organization: Not on file    Attends meetings of clubs or organizations: Not on file    Relationship status: Not on file  Other Topics Concern  . Not on file  Social History Narrative  . Not on file     Family History: The patient's family history includes Heart disease in her father; Hypertension in her brother and father; Melanoma in her mother.  ROS:   Please see the history of present illness.    ROS  All other systems reviewed and negative.   EKGs/Labs/Other Studies Reviewed:    The following studies were reviewed today: PAP download  EKG:  EKG is not ordered today.   Recent Labs: 04/20/2018: BUN 13; Creatinine, Ser 1.09; Potassium 4.2; Sodium 141 05/03/2018: Hemoglobin 9.8; Platelet Count 180 05/08/2018: ALT 35; TSH 3.000   Recent Lipid Panel    Component Value Date/Time   CHOL 145 05/08/2018 0820   TRIG 152 (H) 05/08/2018 0820   HDL 43 05/08/2018 0820   CHOLHDL 3.4 05/08/2018 0820   LDLCALC 72 05/08/2018 0820    Physical Exam:    VS:  BP 126/72   Pulse 76   Ht 5\' 3"  (1.6 m)   Wt 211 lb (95.7 kg)   SpO2 (!) 72%   BMI 37.38 kg/m     Wt Readings from Last 3 Encounters:  05/27/18 211 lb (95.7 kg)  05/08/18 211 lb 12.8 oz (96.1 kg)  05/03/18 212 lb 1.6 oz (96.2 kg)     GEN:  Well nourished, well developed in no acute distress HEENT: Normal NECK: No JVD; No carotid bruits LYMPHATICS: No  lymphadenopathy CARDIAC: RRR, no rubs, gallops.  2/6 SM at RUSB to LLSB RESPIRATORY:  Clear to auscultation without rales, wheezing or rhonchi  ABDOMEN: Soft, non-tender, non-distended MUSCULOSKELETAL:  No edema; No deformity  SKIN: Warm and dry NEUROLOGIC:  Alert and oriented x 3 PSYCHIATRIC:  Normal affect   ASSESSMENT:    1. OSA (obstructive sleep apnea)   2.  Essential hypertension   3. Obesity (BMI 30-39.9)    PLAN:   214 In order of problems listed above:  1.  OSA - the patient is tolerating PAP therapy well without any problems. The PAP download was reviewed today and showed an AHI of 0.4/hr on 14 cm H2O with 100% compliance in using more than 4 hours nightly.  The patient has been using and benefiting from PAP use and will continue to benefit from therapy.   2.  Hypertension -BP is well controlled on exam today.  She will continue on losartan 50 mg daily, Toprol-XL 25 mg daily, Cardizem CD 180 mg daily and Hytrin 5 mg nightly.  Creatinine was normal at 0.98 and potassium 4.6 on 12/10/2017  2.  Obesity - I have encouraged her to get into a routine exercise program and cut back on carbs and portions.    Medication Adjustments/Labs and Tests Ordered: Current medicines are reviewed at length with the patient today.  Concerns regarding medicines are outlined above.  No orders of the defined types were placed in this encounter.  No orders of the defined types were placed in this encounter.   Signed, Fransico Him, MD  05/27/2018 8:19 AM    [place]

## 2018-05-27 NOTE — Patient Instructions (Signed)
Medication Instructions:  Your physician recommends that you continue on your current medications as directed. Please refer to the Current Medication list given to you today.   Labwork: None ordered   Testing/Procedures: None ordered   Follow-Up: Your physician wants you to follow-up in: 12 months with Dr. Mallie Snooks will receive a reminder letter in the mail two months in advance. If you don't receive a letter, please call our office to schedule the follow-up appointment.   Any Other Special Instructions Will Be Listed Below (If Applicable).     If you need a refill on your cardiac medications before your next appointment, please call your pharmacy.

## 2018-05-27 NOTE — Telephone Encounter (Signed)
-----   Message from Dionicio Stall, RN sent at 05/27/2018 12:39 PM EDT ----- Regarding: needs CPAP supplies Sorry.   Lacey Jensen

## 2018-05-27 NOTE — Telephone Encounter (Signed)
Cpap supplies ordered and faxed to [facility]. Cpap supplies..mask, headgear, cushions, filters, heated tubing, water chamber

## 2018-06-13 ENCOUNTER — Other Ambulatory Visit: Payer: Self-pay | Admitting: Interventional Cardiology

## 2018-06-17 ENCOUNTER — Other Ambulatory Visit: Payer: Self-pay | Admitting: Interventional Cardiology

## 2018-07-11 ENCOUNTER — Encounter: Payer: Self-pay | Admitting: Interventional Cardiology

## 2018-09-08 ENCOUNTER — Other Ambulatory Visit: Payer: Self-pay | Admitting: Interventional Cardiology

## 2018-09-16 ENCOUNTER — Other Ambulatory Visit: Payer: Self-pay

## 2018-09-16 ENCOUNTER — Encounter (INDEPENDENT_AMBULATORY_CARE_PROVIDER_SITE_OTHER): Payer: Self-pay

## 2018-09-16 ENCOUNTER — Ambulatory Visit (HOSPITAL_COMMUNITY): Payer: Medicare Other | Attending: Cardiology

## 2018-09-16 DIAGNOSIS — I4819 Other persistent atrial fibrillation: Secondary | ICD-10-CM | POA: Diagnosis present

## 2018-09-16 DIAGNOSIS — I350 Nonrheumatic aortic (valve) stenosis: Secondary | ICD-10-CM

## 2018-09-16 MED ORDER — PERFLUTREN LIPID MICROSPHERE
1.0000 mL | INTRAVENOUS | Status: AC | PRN
  Administered 2018-09-16: 2 mL via INTRAVENOUS

## 2018-09-17 ENCOUNTER — Telehealth: Payer: Self-pay

## 2018-09-17 DIAGNOSIS — I272 Pulmonary hypertension, unspecified: Secondary | ICD-10-CM

## 2018-09-17 NOTE — Telephone Encounter (Signed)
Spoke with the patient, she expressed understanding about her echo results. She accepted Dr. Theodosia Blender recommendations and is schedule on 12/12 with Jory Sims for cath workup with Dr. Aundra Dubin. The patient accepted getting a V/Q scan at the hospital. Sending message to [facility] to schedule.

## 2018-09-17 NOTE — Telephone Encounter (Signed)
-----   Message from Sueanne Margarita, MD sent at 09/16/2018  3:21 PM EST ----- Echo showed normal LVF with mild to moderate AS, moderate MR and TR and severe pulmonary HTN.  Since last echo her PASP has significantly increased.  Patient needs to be brought into office with extender to get set up for right heart cath for further assessment.  She has OSA but this is well treated and therefore pulmonary HTN should not have increased. DLCO on recent PFTs was normal.  Please get a VQ scan to rule out PE.  I would like right heart cath to be with Dr. Aundra Dubin.

## 2018-09-18 NOTE — H&P (View-Only) (Signed)
Cardiology Office Note   Date:  09/19/2018   ID:  VALENA IVANOV, DOB [birth year], MRN [id number]  PCP:  Aletha Halim., PA-C  Cardiologist:  [place]   Chief Complaint  Patient presents with  . Atrial Fibrillation  . Sleep Apnea  . Aortic Stenosis     History of Present Illness: Doris Jefferson is a 77 y.o. female who presents for ongoing assessment and management of hypertension, PAF (DCCV in 2016), with history of OSA on CPAP, Type II diabetes, HL and obesity. Was last seen by Turner for OSA on 05/27/2018 and was doing well, with no complaints.   A repeat echocardiogram revealed that had mild to moderate AS, moderate TR and severe pulmonary HTN. PASP had significantly increased from prior echo (78 mmHg S), with Grade 2 diastolic dysfunction.  Dr.Turner asked that the patient be seen to discuss and set up cardiac cath, RHC and LHC.   She comes today without any new symptoms. She continues to have occasional dyspnea. She denies chest pain, with mild pressure on rare occasions. She is compliant with CPAP. She has also been scheduled for a VQ lung scan   Past Medical History:  Diagnosis Date  . Anemia   . Diabetes ([place])   . Heart murmur   . HTN (hypertension)   . Hyperlipidemia   . Obesity (BMI 30-39.9) 03/04/2014  . OSA (obstructive sleep apnea)    severe with AHI 31/hr  . PAF (paroxysmal atrial fibrillation) ([place])     Past Surgical History:  Procedure Laterality Date  . ABDOMINAL HYSTERECTOMY    . CARDIOVERSION N/A 12/19/2013   Procedure: CARDIOVERSION;  Surgeon: Sinclair Grooms, MD;  Location[facility] ENDOSCOPY;  Service: Cardiovascular;  Laterality: N/A;  . CARDIOVERSION N/A 08/16/2015   Procedure: CARDIOVERSION;  Surgeon: Larey Dresser, MD;  Location: [place];  Service: Cardiovascular;  Laterality: N/A;  . CARDIOVERSION N/A 09/13/2015   Procedure: CARDIOVERSION;  Surgeon: Sueanne Margarita, MD;  Location: [place];  Service: Cardiovascular;  Laterality: N/A;  .  COLONOSCOPY WITH PROPOFOL N/A 11/13/2017   Procedure: COLONOSCOPY WITH PROPOFOL;  Surgeon: Wilford Corner, MD;  Location: [facility] ENDOSCOPY;  Service: Endoscopy;  Laterality: N/A;  ANTIBIOTIC NEEDED BEFORE PROCEDURE  . ESOPHAGOGASTRODUODENOSCOPY (EGD) WITH PROPOFOL N/A 11/13/2017   Procedure: ESOPHAGOGASTRODUODENOSCOPY (EGD) WITH PROPOFOL;  Surgeon: Wilford Corner, MD;  Location: [facility] ENDOSCOPY;  Service: Endoscopy;  Laterality: N/A;  . EYE SURGERY    . SPINE SURGERY       Current Outpatient Medications  Medication Sig Dispense Refill  . acetaminophen (TYLENOL) 325 MG tablet Take 650 mg by mouth every 6 (six) hours as needed for moderate pain or headache.    [name] amiodarone (PACERONE) 200 MG tablet TAKE 1 TABLET(200 MG) BY MOUTH DAILY 90 tablet 3  . buPROPion (WELLBUTRIN XL) 300 MG 24 hr tablet Take 300 mg by mouth daily.     [name] diltiazem (CARDIZEM CD) 180 MG 24 hr capsule Take 180 mg by mouth daily.    [name] doxylamine, Sleep, (UNISOM) 25 MG tablet Take 25 mg by mouth at bedtime as needed for sleep.    [name] ELIQUIS 5 MG TABS tablet TAKE 1 TABLET(5 MG) BY MOUTH TWICE DAILY 60 tablet 10  . fexofenadine (ALLEGRA) 60 MG tablet Take 60 mg by mouth daily.    . furosemide (LASIX) 20 MG tablet TAKE 1 TABLET BY MOUTH DAILY 90 tablet 3  . glimepiride (AMARYL) 4 MG tablet Take 4 mg by mouth daily with breakfast.    .  liraglutide (VICTOZA) 18 MG/3ML SOPN Inject 1.8 mg into the skin daily.     [name] losartan (COZAAR) 50 MG tablet Take one tablet by mouth by daily 90 tablet 0  . MAGNESIUM-OXIDE 400 (241.3 Mg) MG tablet TAKE 1 TABLET BY MOUTH TWICE DAILY 180 tablet 3  . metFORMIN (GLUCOPHAGE) 1000 MG tablet Take 1,000 mg by mouth 2 (two) times daily with a meal.    . metoprolol succinate (TOPROL-XL) 25 MG 24 hr tablet TAKE 1 TABLET(25 MG) BY MOUTH TWICE DAILY 180 tablet 3  . rosuvastatin (CRESTOR) 20 MG tablet Take 20 mg by mouth daily.    [name] terazosin (HYTRIN) 5 MG capsule Take 5 mg by mouth at bedtime.     No current  facility-administered medications for this visit.     Allergies:   Minocin [minocycline hcl]; Benazepril; Hydrochlorothiazide; and Tape    Social History:  The patient  reports that she has never smoked. She has never used smokeless tobacco. She reports that she does not drink alcohol or use drugs.   Family History:  The patient's family history includes Heart disease in her father; Hypertension in her brother and father; Melanoma in her mother.    ROS: All other systems are reviewed and negative. Unless otherwise mentioned in H&P    PHYSICAL EXAM: VS:  BP (!) 152/62   Pulse 70   Ht 5\' 3"  (1.6 m)   Wt 209 lb (94.8 kg)   BMI 37.02 kg/m  , BMI Body mass index is 37.02 kg/m. GEN: Well nourished, well developed, in no acute distress HEENT: normal Neck: no JVD, carotid bruits, or masses Cardiac: RRR; 2/6 holosystolic murmur, heard at both right sternal border and at the apex.No, rubs, or gallops,no edema  Respiratory:  Clear to auscultation bilaterally, normal work of breathing GI: soft, nontender, nondistended, + BS MS: no deformity or atrophy Skin: warm and dry, no rash Neuro:  Strength and sensation are intact Psych: euthymic mood, full affect   EKG: NSR rate of 70 bpm, with LAFB, and non-specific T-wave abnormalities.   Recent Labs: 04/20/2018: BUN 13; Creatinine, Ser 1.09; Potassium 4.2; Sodium 141 05/03/2018: Hemoglobin 9.8; Platelet Count 180 05/08/2018: ALT 35; TSH 3.000    Lipid Panel    Component Value Date/Time   CHOL 145 05/08/2018 0820   TRIG 152 (H) 05/08/2018 0820   HDL 43 05/08/2018 0820   CHOLHDL 3.4 05/08/2018 0820   LDLCALC 72 05/08/2018 0820      Wt Readings from Last 3 Encounters:  09/19/18 209 lb (94.8 kg)  05/27/18 211 lb (95.7 kg)  05/08/18 211 lb 12.8 oz (96.1 kg)      Other studies Reviewed: Echocardiogram 09/26/18  Left ventricle: The cavity size was normal. Systolic function was   normal. The estimated ejection fraction was in the  range of 55%   to 60%. Constrast utilized. Wall motion was normal; there were no   regional wall motion abnormalities. Features are consistent with   a pseudonormal left ventricular filling pattern, with concomitant   abnormal relaxation and increased filling pressure (grade 2   diastolic dysfunction). - Aortic valve: There was mild to moderate stenosis. Peak velocity   (S): 290 cm/s. Mean gradient (S): 17 mm Hg. - Mitral valve: There was moderate regurgitation. - Left atrium: The atrium was mildly dilated. - Right ventricle: The cavity size was mildly dilated. Wall   thickness was normal. - Tricuspid valve: There was mild regurgitation. - Pulmonary arteries: Systolic pressure was severely increased.  PA   peak pressure: 78 mm Hg (S).  ASSESSMENT AND PLAN:  1. Pulmonary HTN: She has been scheduled for RHC and LHC as the request of Dr. Radford Pax, which will be completed on Tuesday, December 17, as first case with  Dr. Ellwood Sayers.  I have discussed the procedure and reason for proceeding per Dr. Theodosia Blender review of her echo.   The patient understands that risks include but are not limited to stroke (1 in 1000), death (1 in 51), kidney failure [usually temporary] (1 in 500), bleeding (1 in 200), allergic reaction [possibly serious] (1 in 200), and agrees to proceed.   2. AoV stenosis: LHC will help to define pressures and gradients and give definitive evaluation of her coronary anatomy, with multiple CVRF to include age, obesity, HTN, HL.  3. OSA: She is to continue on CPAP as directed.  4. Hypercholesterolemia: Continue on Crestor.    Current medicines are reviewed at length with the patient today.  Will hold diabetic meds, Eliquis (for two days-stop on 09/22/2018), and ARB prior to the cardiac cath.   Labs/ tests ordered today include: Cardiac cath  Phill Myron. West Pugh, ANP, AACC   09/19/2018 12:41 PM    [place] [address] Office  [phone] Fax [phone]

## 2018-09-18 NOTE — Progress Notes (Signed)
Cardiology Office Note   Date:  09/19/2018   ID:  KAZZANDRA DESAULNIERS, DOB 1941-06-10, MRN [id number]  PCP:  Aletha Halim., PA-C  Cardiologist:  [place]   Chief Complaint  Patient presents with  . Atrial Fibrillation  . Sleep Apnea  . Aortic Stenosis     History of Present Illness: Doris Jefferson is a 77 y.o. female who presents for ongoing assessment and management of hypertension, PAF (DCCV in 2016), with history of OSA on CPAP, Type II diabetes, HL and obesity. Was last seen by Turner for OSA on 05/27/2018 and was doing well, with no complaints.   A repeat echocardiogram revealed that had mild to moderate AS, moderate TR and severe pulmonary HTN. PASP had significantly increased from prior echo (78 mmHg S), with Grade 2 diastolic dysfunction.  Dr.Turner asked that the patient be seen to discuss and set up cardiac cath, RHC and LHC.   She comes today without any new symptoms. She continues to have occasional dyspnea. She denies chest pain, with mild pressure on rare occasions. She is compliant with CPAP. She has also been scheduled for a VQ lung scan   Past Medical History:  Diagnosis Date  . Anemia   . Diabetes ([place])   . Heart murmur   . HTN (hypertension)   . Hyperlipidemia   . Obesity (BMI 30-39.9) 03/04/2014  . OSA (obstructive sleep apnea)    severe with AHI 31/hr  . PAF (paroxysmal atrial fibrillation) ([place])     Past Surgical History:  Procedure Laterality Date  . ABDOMINAL HYSTERECTOMY    . CARDIOVERSION N/A 12/19/2013   Procedure: CARDIOVERSION;  Surgeon: Sinclair Grooms, MD;  Location[facility] ENDOSCOPY;  Service: Cardiovascular;  Laterality: N/A;  . CARDIOVERSION N/A 08/16/2015   Procedure: CARDIOVERSION;  Surgeon: Larey Dresser, MD;  Location: [place];  Service: Cardiovascular;  Laterality: N/A;  . CARDIOVERSION N/A 09/13/2015   Procedure: CARDIOVERSION;  Surgeon: Sueanne Margarita, MD;  Location: [place];  Service: Cardiovascular;  Laterality: N/A;  .  COLONOSCOPY WITH PROPOFOL N/A 11/13/2017   Procedure: COLONOSCOPY WITH PROPOFOL;  Surgeon: Wilford Corner, MD;  Location: [facility] ENDOSCOPY;  Service: Endoscopy;  Laterality: N/A;  ANTIBIOTIC NEEDED BEFORE PROCEDURE  . ESOPHAGOGASTRODUODENOSCOPY (EGD) WITH PROPOFOL N/A 11/13/2017   Procedure: ESOPHAGOGASTRODUODENOSCOPY (EGD) WITH PROPOFOL;  Surgeon: Wilford Corner, MD;  Location: [facility] ENDOSCOPY;  Service: Endoscopy;  Laterality: N/A;  . EYE SURGERY    . SPINE SURGERY       Current Outpatient Medications  Medication Sig Dispense Refill  . acetaminophen (TYLENOL) 325 MG tablet Take 650 mg by mouth every 6 (six) hours as needed for moderate pain or headache.    [name] amiodarone (PACERONE) 200 MG tablet TAKE 1 TABLET(200 MG) BY MOUTH DAILY 90 tablet 3  . buPROPion (WELLBUTRIN XL) 300 MG 24 hr tablet Take 300 mg by mouth daily.     [name] diltiazem (CARDIZEM CD) 180 MG 24 hr capsule Take 180 mg by mouth daily.    [name] doxylamine, Sleep, (UNISOM) 25 MG tablet Take 25 mg by mouth at bedtime as needed for sleep.    [name] ELIQUIS 5 MG TABS tablet TAKE 1 TABLET(5 MG) BY MOUTH TWICE DAILY 60 tablet 10  . fexofenadine (ALLEGRA) 60 MG tablet Take 60 mg by mouth daily.    . furosemide (LASIX) 20 MG tablet TAKE 1 TABLET BY MOUTH DAILY 90 tablet 3  . glimepiride (AMARYL) 4 MG tablet Take 4 mg by mouth daily with breakfast.    .  liraglutide (VICTOZA) 18 MG/3ML SOPN Inject 1.8 mg into the skin daily.     [name] losartan (COZAAR) 50 MG tablet Take one tablet by mouth by daily 90 tablet 0  . MAGNESIUM-OXIDE 400 (241.3 Mg) MG tablet TAKE 1 TABLET BY MOUTH TWICE DAILY 180 tablet 3  . metFORMIN (GLUCOPHAGE) 1000 MG tablet Take 1,000 mg by mouth 2 (two) times daily with a meal.    . metoprolol succinate (TOPROL-XL) 25 MG 24 hr tablet TAKE 1 TABLET(25 MG) BY MOUTH TWICE DAILY 180 tablet 3  . rosuvastatin (CRESTOR) 20 MG tablet Take 20 mg by mouth daily.    [name] terazosin (HYTRIN) 5 MG capsule Take 5 mg by mouth at bedtime.     No current  facility-administered medications for this visit.     Allergies:   Minocin [minocycline hcl]; Benazepril; Hydrochlorothiazide; and Tape    Social History:  The patient  reports that she has never smoked. She has never used smokeless tobacco. She reports that she does not drink alcohol or use drugs.   Family History:  The patient's family history includes Heart disease in her father; Hypertension in her brother and father; Melanoma in her mother.    ROS: All other systems are reviewed and negative. Unless otherwise mentioned in H&P    PHYSICAL EXAM: VS:  BP (!) 152/62   Pulse 70   Ht 5\' 3"  (1.6 m)   Wt 209 lb (94.8 kg)   BMI 37.02 kg/m  , BMI Body mass index is 37.02 kg/m. GEN: Well nourished, well developed, in no acute distress HEENT: normal Neck: no JVD, carotid bruits, or masses Cardiac: RRR; 2/6 holosystolic murmur, heard at both right sternal border and at the apex.No, rubs, or gallops,no edema  Respiratory:  Clear to auscultation bilaterally, normal work of breathing GI: soft, nontender, nondistended, + BS MS: no deformity or atrophy Skin: warm and dry, no rash Neuro:  Strength and sensation are intact Psych: euthymic mood, full affect   EKG: NSR rate of 70 bpm, with LAFB, and non-specific T-wave abnormalities.   Recent Labs: 04/20/2018: BUN 13; Creatinine, Ser 1.09; Potassium 4.2; Sodium 141 05/03/2018: Hemoglobin 9.8; Platelet Count 180 05/08/2018: ALT 35; TSH 3.000    Lipid Panel    Component Value Date/Time   CHOL 145 05/08/2018 0820   TRIG 152 (H) 05/08/2018 0820   HDL 43 05/08/2018 0820   CHOLHDL 3.4 05/08/2018 0820   LDLCALC 72 05/08/2018 0820      Wt Readings from Last 3 Encounters:  09/19/18 209 lb (94.8 kg)  05/27/18 211 lb (95.7 kg)  05/08/18 211 lb 12.8 oz (96.1 kg)      Other studies Reviewed: Echocardiogram 09-Oct-2018  Left ventricle: The cavity size was normal. Systolic function was   normal. The estimated ejection fraction was in the  range of 55%   to 60%. Constrast utilized. Wall motion was normal; there were no   regional wall motion abnormalities. Features are consistent with   a pseudonormal left ventricular filling pattern, with concomitant   abnormal relaxation and increased filling pressure (grade 2   diastolic dysfunction). - Aortic valve: There was mild to moderate stenosis. Peak velocity   (S): 290 cm/s. Mean gradient (S): 17 mm Hg. - Mitral valve: There was moderate regurgitation. - Left atrium: The atrium was mildly dilated. - Right ventricle: The cavity size was mildly dilated. Wall   thickness was normal. - Tricuspid valve: There was mild regurgitation. - Pulmonary arteries: Systolic pressure was severely increased.  PA   peak pressure: 78 mm Hg (S).  ASSESSMENT AND PLAN:  1. Pulmonary HTN: She has been scheduled for RHC and LHC as the request of Dr. Radford Pax, which will be completed on Tuesday, December 17, as first case with  Dr. Ellwood Sayers.  I have discussed the procedure and reason for proceeding per Dr. Theodosia Blender review of her echo.   The patient understands that risks include but are not limited to stroke (1 in 1000), death (1 in 101), kidney failure [usually temporary] (1 in 500), bleeding (1 in 200), allergic reaction [possibly serious] (1 in 200), and agrees to proceed.   2. AoV stenosis: LHC will help to define pressures and gradients and give definitive evaluation of her coronary anatomy, with multiple CVRF to include age, obesity, HTN, HL.  3. OSA: She is to continue on CPAP as directed.  4. Hypercholesterolemia: Continue on Crestor.    Current medicines are reviewed at length with the patient today.  Will hold diabetic meds, Eliquis (for two days-stop on 09/22/2018), and ARB prior to the cardiac cath.   Labs/ tests ordered today include: Cardiac cath  Phill Myron. West Pugh, ANP, AACC   09/19/2018 12:41 PM    [place] [address] Office  [phone] Fax [phone]

## 2018-09-19 ENCOUNTER — Ambulatory Visit (INDEPENDENT_AMBULATORY_CARE_PROVIDER_SITE_OTHER): Payer: Medicare Other | Admitting: Adult Health

## 2018-09-19 ENCOUNTER — Encounter: Payer: Self-pay | Admitting: Adult Health

## 2018-09-19 VITALS — BP 152/62 | HR 70 | Ht 63.0 in | Wt 209.0 lb

## 2018-09-19 DIAGNOSIS — I350 Nonrheumatic aortic (valve) stenosis: Secondary | ICD-10-CM

## 2018-09-19 DIAGNOSIS — R931 Abnormal findings on diagnostic imaging of heart and coronary circulation: Secondary | ICD-10-CM

## 2018-09-19 DIAGNOSIS — Z79899 Other long term (current) drug therapy: Secondary | ICD-10-CM

## 2018-09-19 DIAGNOSIS — I272 Pulmonary hypertension, unspecified: Secondary | ICD-10-CM

## 2018-09-19 DIAGNOSIS — I480 Paroxysmal atrial fibrillation: Secondary | ICD-10-CM

## 2018-09-19 NOTE — Patient Instructions (Addendum)
Doris Jefferson  09/19/2018  You are scheduled for a Cardiac Catheterization on Tuesday, December 17 with Dr. Larae Grooms. 1. Please arrive at the [facility] (Main Entrance A) at [facility]: [address] [place], [place] [address] at 5:30 AM (This time is two hours before your procedure to ensure your preparation). Free valet parking service is available.  Special note: Every effort is made to have your procedure done on time. Please understand that emergencies sometimes delay scheduled procedures. 2. Diet: Do not eat solid foods after midnight.  You may have clear liquids until 5am upon the day of the procedure.  3. Labs: You will need to have blood drawn on Thursday, December 12 at [place], [place] Open: 8am - 5pm (Lunch 12:30 - 1:30)   Phone: [phone]. You do not need to be fasting.  4. Medication instructions in preparation for your procedure: Stop taking Eliquis (Apixiban) on Sunday, December 15. Stop taking, Lasix (Furosemide)  Tuesday, December 17, Hold Metformin, Glimperide and Victoza the morning of the procedure  On the morning of your procedure, take any morning medicines NOT listed above.  You may use sips of water only.  5. Plan for one night stay--bring personal belongings. 6. Bring a current list of your medications and current insurance cards. 7. You MUST have a responsible person to drive you home. 8. Someone MUST be with you the first 24 hours after you arrive home or your discharge will be delayed. 9. Please wear clothes that are easy to get on and off and wear slip-on shoes.  Thank you for allowing [place] to care for you!   -- [facility]

## 2018-09-20 LAB — BASIC METABOLIC PANEL
BUN/Creatinine Ratio: 16 (ref 12–28)
BUN: 15 mg/dL (ref 8–27)
CO2: 21 mmol/L (ref 20–29)
Calcium: 9.1 mg/dL (ref 8.7–10.3)
Chloride: 101 mmol/L (ref 96–106)
Creatinine, Ser: 0.91 mg/dL (ref 0.57–1.00)
GFR calc Af Amer: 70 mL/min/{1.73_m2} (ref >59)
GFR calc non Af Amer: 61 mL/min/{1.73_m2} (ref >59)
Glucose: 268 mg/dL — ABNORMAL HIGH (ref 65–99)
Potassium: 4.3 mmol/L (ref 3.5–5.2)
SODIUM: 144 mmol/L (ref 134–144)

## 2018-09-20 LAB — CBC
Hematocrit: 36.4 % (ref 34.0–46.6)
Hemoglobin: 11.7 g/dL (ref 11.1–15.9)
MCH: 28.7 pg (ref 26.6–33.0)
MCHC: 32.1 g/dL (ref 31.5–35.7)
MCV: 89 fL (ref 79–97)
PLATELETS: 149 10*3/uL — AB (ref 150–450)
RBC: 4.08 x10E6/uL (ref 3.77–5.28)
RDW: 12.9 % (ref 12.3–15.4)
WBC: 7.2 10*3/uL (ref 3.4–10.8)

## 2018-09-23 ENCOUNTER — Telehealth: Payer: Self-pay | Admitting: *Deleted

## 2018-09-23 ENCOUNTER — Ambulatory Visit: Payer: Medicare Other | Admitting: Interventional Cardiology

## 2018-09-23 NOTE — Telephone Encounter (Addendum)
Pt contacted pre-catheterization scheduled at [facility] for: Tuesday September 24, 2018 7:30 AM Verified arrival time and place: [place] Entrance A at: 5:30 AM  No solid food after midnight prior to cath, clear liquids until 5 AM day of procedure. Contrast allergy: no  Hold: Eliquis-Stop 09/22/18 until post procedure. Metformin-day of procedure and 48 hours post procedure. Victoza-AM of procedure. Glimepiride-AM of procedure. Furosemide-AM of procedure.   Except hold medications AM meds can be  taken pre-cath with sip of water including: ASA 81 mg  Confirm patient has responsible person to drive home post procedure and for 24 hours after you arrive home.  LMTCB to review instructions with patient.

## 2018-09-23 NOTE — Telephone Encounter (Signed)
Reviewed instructions with patient, she verbalized understanding, thanked me for call

## 2018-09-24 ENCOUNTER — Ambulatory Visit (HOSPITAL_COMMUNITY): Payer: Medicare Other

## 2018-09-24 ENCOUNTER — Other Ambulatory Visit: Payer: Self-pay

## 2018-09-24 ENCOUNTER — Encounter (HOSPITAL_COMMUNITY): Payer: Self-pay | Admitting: General Practice

## 2018-09-24 ENCOUNTER — Inpatient Hospital Stay (HOSPITAL_COMMUNITY): Payer: Medicare Other

## 2018-09-24 ENCOUNTER — Inpatient Hospital Stay (HOSPITAL_COMMUNITY)
Admission: RE | Admit: 2018-09-24 | Discharge: 2018-10-09 | DRG: 216 | Disposition: A | Discharge status: Home Health | Payer: Medicare Other | Attending: Interventional Cardiology | Admitting: Interventional Cardiology

## 2018-09-24 ENCOUNTER — Encounter (HOSPITAL_COMMUNITY)
Admission: RE | Disposition: A | Discharge status: Home Health | Payer: Self-pay | Source: Home / Self Care | Attending: Interventional Cardiology

## 2018-09-24 DIAGNOSIS — I10 Essential (primary) hypertension: Secondary | ICD-10-CM | POA: Diagnosis not present

## 2018-09-24 DIAGNOSIS — I25118 Atherosclerotic heart disease of native coronary artery with other forms of angina pectoris: Secondary | ICD-10-CM

## 2018-09-24 DIAGNOSIS — I350 Nonrheumatic aortic (valve) stenosis: Secondary | ICD-10-CM | POA: Diagnosis present

## 2018-09-24 DIAGNOSIS — T501X5A Adverse effect of loop [high-ceiling] diuretics, initial encounter: Secondary | ICD-10-CM | POA: Diagnosis not present

## 2018-09-24 DIAGNOSIS — I2582 Chronic total occlusion of coronary artery: Secondary | ICD-10-CM | POA: Diagnosis present

## 2018-09-24 DIAGNOSIS — I361 Nonrheumatic tricuspid (valve) insufficiency: Secondary | ICD-10-CM | POA: Diagnosis not present

## 2018-09-24 DIAGNOSIS — R931 Abnormal findings on diagnostic imaging of heart and coronary circulation: Secondary | ICD-10-CM

## 2018-09-24 DIAGNOSIS — Z01810 Encounter for preprocedural cardiovascular examination: Secondary | ICD-10-CM | POA: Diagnosis not present

## 2018-09-24 DIAGNOSIS — R0682 Tachypnea, not elsewhere classified: Secondary | ICD-10-CM | POA: Diagnosis not present

## 2018-09-24 DIAGNOSIS — E1165 Type 2 diabetes mellitus with hyperglycemia: Secondary | ICD-10-CM | POA: Diagnosis present

## 2018-09-24 DIAGNOSIS — E119 Type 2 diabetes mellitus without complications: Secondary | ICD-10-CM

## 2018-09-24 DIAGNOSIS — I110 Hypertensive heart disease with heart failure: Secondary | ICD-10-CM | POA: Diagnosis present

## 2018-09-24 DIAGNOSIS — I959 Hypotension, unspecified: Secondary | ICD-10-CM | POA: Diagnosis not present

## 2018-09-24 DIAGNOSIS — I480 Paroxysmal atrial fibrillation: Secondary | ICD-10-CM

## 2018-09-24 DIAGNOSIS — D72829 Elevated white blood cell count, unspecified: Secondary | ICD-10-CM | POA: Diagnosis not present

## 2018-09-24 DIAGNOSIS — I25810 Atherosclerosis of coronary artery bypass graft(s) without angina pectoris: Secondary | ICD-10-CM | POA: Diagnosis not present

## 2018-09-24 DIAGNOSIS — Z6841 Body Mass Index (BMI) 40.0 and over, adult: Secondary | ICD-10-CM | POA: Diagnosis not present

## 2018-09-24 DIAGNOSIS — I2721 Secondary pulmonary arterial hypertension: Secondary | ICD-10-CM | POA: Diagnosis not present

## 2018-09-24 DIAGNOSIS — E7800 Pure hypercholesterolemia, unspecified: Secondary | ICD-10-CM | POA: Diagnosis present

## 2018-09-24 DIAGNOSIS — Z951 Presence of aortocoronary bypass graft: Secondary | ICD-10-CM

## 2018-09-24 DIAGNOSIS — G4733 Obstructive sleep apnea (adult) (pediatric): Secondary | ICD-10-CM | POA: Diagnosis not present

## 2018-09-24 DIAGNOSIS — I5033 Acute on chronic diastolic (congestive) heart failure: Secondary | ICD-10-CM | POA: Diagnosis present

## 2018-09-24 DIAGNOSIS — E669 Obesity, unspecified: Secondary | ICD-10-CM

## 2018-09-24 DIAGNOSIS — E861 Hypovolemia: Secondary | ICD-10-CM | POA: Diagnosis not present

## 2018-09-24 DIAGNOSIS — I272 Pulmonary hypertension, unspecified: Secondary | ICD-10-CM

## 2018-09-24 DIAGNOSIS — Z9689 Presence of other specified functional implants: Secondary | ICD-10-CM

## 2018-09-24 DIAGNOSIS — R0600 Dyspnea, unspecified: Secondary | ICD-10-CM

## 2018-09-24 DIAGNOSIS — E1169 Type 2 diabetes mellitus with other specified complication: Secondary | ICD-10-CM | POA: Diagnosis not present

## 2018-09-24 DIAGNOSIS — Z90710 Acquired absence of both cervix and uterus: Secondary | ICD-10-CM

## 2018-09-24 DIAGNOSIS — I200 Unstable angina: Secondary | ICD-10-CM | POA: Diagnosis not present

## 2018-09-24 DIAGNOSIS — F329 Major depressive disorder, single episode, unspecified: Secondary | ICD-10-CM | POA: Diagnosis not present

## 2018-09-24 DIAGNOSIS — I4819 Other persistent atrial fibrillation: Secondary | ICD-10-CM | POA: Diagnosis not present

## 2018-09-24 DIAGNOSIS — Z79899 Other long term (current) drug therapy: Secondary | ICD-10-CM

## 2018-09-24 DIAGNOSIS — E785 Hyperlipidemia, unspecified: Secondary | ICD-10-CM | POA: Diagnosis present

## 2018-09-24 DIAGNOSIS — Z8249 Family history of ischemic heart disease and other diseases of the circulatory system: Secondary | ICD-10-CM

## 2018-09-24 DIAGNOSIS — D62 Acute posthemorrhagic anemia: Secondary | ICD-10-CM | POA: Diagnosis not present

## 2018-09-24 DIAGNOSIS — R0609 Other forms of dyspnea: Secondary | ICD-10-CM | POA: Diagnosis not present

## 2018-09-24 DIAGNOSIS — E876 Hypokalemia: Secondary | ICD-10-CM | POA: Diagnosis not present

## 2018-09-24 DIAGNOSIS — N170 Acute kidney failure with tubular necrosis: Secondary | ICD-10-CM | POA: Diagnosis not present

## 2018-09-24 DIAGNOSIS — Z952 Presence of prosthetic heart valve: Secondary | ICD-10-CM | POA: Diagnosis not present

## 2018-09-24 DIAGNOSIS — Z09 Encounter for follow-up examination after completed treatment for conditions other than malignant neoplasm: Secondary | ICD-10-CM

## 2018-09-24 DIAGNOSIS — F32A Depression, unspecified: Secondary | ICD-10-CM

## 2018-09-24 DIAGNOSIS — Z794 Long term (current) use of insulin: Secondary | ICD-10-CM

## 2018-09-24 DIAGNOSIS — J90 Pleural effusion, not elsewhere classified: Secondary | ICD-10-CM

## 2018-09-24 DIAGNOSIS — Z7984 Long term (current) use of oral hypoglycemic drugs: Secondary | ICD-10-CM

## 2018-09-24 DIAGNOSIS — D696 Thrombocytopenia, unspecified: Secondary | ICD-10-CM | POA: Diagnosis not present

## 2018-09-24 DIAGNOSIS — I5022 Chronic systolic (congestive) heart failure: Secondary | ICD-10-CM

## 2018-09-24 DIAGNOSIS — I4891 Unspecified atrial fibrillation: Secondary | ICD-10-CM | POA: Diagnosis not present

## 2018-09-24 DIAGNOSIS — Z7901 Long term (current) use of anticoagulants: Secondary | ICD-10-CM | POA: Diagnosis not present

## 2018-09-24 DIAGNOSIS — K219 Gastro-esophageal reflux disease without esophagitis: Secondary | ICD-10-CM | POA: Diagnosis present

## 2018-09-24 DIAGNOSIS — I2510 Atherosclerotic heart disease of native coronary artery without angina pectoris: Principal | ICD-10-CM | POA: Diagnosis present

## 2018-09-24 DIAGNOSIS — Z85828 Personal history of other malignant neoplasm of skin: Secondary | ICD-10-CM

## 2018-09-24 DIAGNOSIS — R197 Diarrhea, unspecified: Secondary | ICD-10-CM | POA: Diagnosis not present

## 2018-09-24 DIAGNOSIS — Z006 Encounter for examination for normal comparison and control in clinical research program: Secondary | ICD-10-CM

## 2018-09-24 HISTORY — DX: Obstructive sleep apnea (adult) (pediatric): G47.33

## 2018-09-24 HISTORY — DX: Type 2 diabetes mellitus without complications: E11.9

## 2018-09-24 HISTORY — DX: Gastro-esophageal reflux disease without esophagitis: K21.9

## 2018-09-24 HISTORY — PX: CARDIAC CATHETERIZATION: SHX172

## 2018-09-24 HISTORY — DX: Unspecified malignant neoplasm of skin, unspecified: C44.90

## 2018-09-24 HISTORY — DX: Depression, unspecified: F32.A

## 2018-09-24 HISTORY — DX: Dependence on other enabling machines and devices: Z99.89

## 2018-09-24 HISTORY — DX: Iron deficiency anemia, unspecified: D50.9

## 2018-09-24 HISTORY — DX: Major depressive disorder, single episode, unspecified: F32.9

## 2018-09-24 HISTORY — PX: RIGHT/LEFT HEART CATH AND CORONARY ANGIOGRAPHY: CATH118266

## 2018-09-24 HISTORY — DX: Pneumonia, unspecified organism: J18.9

## 2018-09-24 LAB — POCT I-STAT 3, ART BLOOD GAS (G3+)
Bicarbonate: 25.8 mmol/L (ref 20.0–28.0)
O2 Saturation: 92 %
TCO2: 27 mmol/L (ref 22–32)
pCO2 arterial: 44.2 mmHg (ref 32.0–48.0)
pH, Arterial: 7.374 (ref 7.350–7.450)
pO2, Arterial: 67 mmHg — ABNORMAL LOW (ref 83.0–108.0)

## 2018-09-24 LAB — POCT I-STAT 3, VENOUS BLOOD GAS (G3P V)
ACID-BASE DEFICIT: 1 mmol/L (ref 0.0–2.0)
Acid-base deficit: 1 mmol/L (ref 0.0–2.0)
Bicarbonate: 24.8 mmol/L (ref 20.0–28.0)
Bicarbonate: 24.8 mmol/L (ref 20.0–28.0)
O2 Saturation: 64 %
O2 Saturation: 64 %
TCO2: 26 mmol/L (ref 22–32)
TCO2: 26 mmol/L (ref 22–32)
pCO2, Ven: 43 mmHg — ABNORMAL LOW (ref 44.0–60.0)
pCO2, Ven: 43.7 mmHg — ABNORMAL LOW (ref 44.0–60.0)
pH, Ven: 7.37 (ref 7.250–7.430)
pH, Ven: 7.363 (ref 7.250–7.430)
pO2, Ven: 34 mmHg (ref 32.0–45.0)
pO2, Ven: 35 mmHg (ref 32.0–45.0)

## 2018-09-24 LAB — GLUCOSE, CAPILLARY
GLUCOSE-CAPILLARY: 148 mg/dL — AB (ref 70–99)
Glucose-Capillary: 198 mg/dL — ABNORMAL HIGH (ref 70–99)
Glucose-Capillary: 107 mg/dL — ABNORMAL HIGH (ref 70–99)
Glucose-Capillary: 181 mg/dL — ABNORMAL HIGH (ref 70–99)

## 2018-09-24 SURGERY — RIGHT/LEFT HEART CATH AND CORONARY ANGIOGRAPHY
Anesthesia: LOCAL

## 2018-09-24 MED ORDER — GLIMEPIRIDE 4 MG PO TABS
4.0000 mg | ORAL_TABLET | Freq: Every day | ORAL | Status: DC
  Administered 2018-09-25 – 2018-09-26 (×2): 4 mg via ORAL
  Filled 2018-09-24 (×2): qty 1

## 2018-09-24 MED ORDER — LIRAGLUTIDE 18 MG/3ML ~~LOC~~ SOPN: 1.8000 mg | PEN_INJECTOR | Freq: Every day | SUBCUTANEOUS | Status: DC

## 2018-09-24 MED ORDER — APIXABAN 5 MG PO TABS: 5.0000 mg | ORAL_TABLET | Freq: Two times a day (BID) | ORAL | 10 refills | Status: DC

## 2018-09-24 MED ORDER — ROSUVASTATIN CALCIUM 20 MG PO TABS
20.0000 mg | ORAL_TABLET | Freq: Every day | ORAL | Status: DC
  Administered 2018-09-25 – 2018-10-09 (×14): 20 mg via ORAL
  Filled 2018-09-24 (×3): qty 1
  Filled 2018-09-24: qty 4
  Filled 2018-09-24 (×10): qty 1

## 2018-09-24 MED ORDER — VERAPAMIL HCL 2.5 MG/ML IV SOLN
INTRAVENOUS | Status: DC | PRN
  Administered 2018-09-24: 10 mL via INTRA_ARTERIAL

## 2018-09-24 MED ORDER — FENTANYL CITRATE (PF) 100 MCG/2ML IJ SOLN
INTRAMUSCULAR | Status: DC | PRN
  Administered 2018-09-24 (×2): 25 ug via INTRAVENOUS

## 2018-09-24 MED ORDER — FUROSEMIDE 20 MG PO TABS
20.0000 mg | ORAL_TABLET | Freq: Every day | ORAL | Status: DC
  Administered 2018-09-25 – 2018-09-26 (×2): 20 mg via ORAL
  Filled 2018-09-24 (×2): qty 1

## 2018-09-24 MED ORDER — MIDAZOLAM HCL 2 MG/2ML IJ SOLN
INTRAMUSCULAR | Status: AC
  Filled 2018-09-24: qty 2

## 2018-09-24 MED ORDER — HEPARIN SODIUM (PORCINE) 1000 UNIT/ML IJ SOLN
INTRAMUSCULAR | Status: DC | PRN
  Administered 2018-09-24: 4500 [IU] via INTRAVENOUS

## 2018-09-24 MED ORDER — HYDRALAZINE HCL 20 MG/ML IJ SOLN
10.0000 mg | Freq: Four times a day (QID) | INTRAMUSCULAR | Status: DC | PRN
  Administered 2018-09-24: 18:00:00 10 mg via INTRAVENOUS
  Filled 2018-09-24 (×2): qty 1

## 2018-09-24 MED ORDER — AMIODARONE HCL 200 MG PO TABS
200.0000 mg | ORAL_TABLET | Freq: Every day | ORAL | Status: DC
  Administered 2018-09-25 – 2018-09-26 (×2): 200 mg via ORAL
  Filled 2018-09-24 (×2): qty 1

## 2018-09-24 MED ORDER — SODIUM CHLORIDE 0.9% FLUSH: 3.0000 mL | Freq: Two times a day (BID) | INTRAVENOUS | Status: DC

## 2018-09-24 MED ORDER — SODIUM CHLORIDE 0.9 % IV SOLN: 250.0000 mL | INTRAVENOUS | Status: DC | PRN

## 2018-09-24 MED ORDER — ASPIRIN 81 MG PO CHEW
81.0000 mg | CHEWABLE_TABLET | Freq: Every day | ORAL | Status: DC
  Administered 2018-09-25 – 2018-09-26 (×2): 81 mg via ORAL
  Filled 2018-09-24 (×2): qty 1

## 2018-09-24 MED ORDER — LOSARTAN POTASSIUM 50 MG PO TABS
50.0000 mg | ORAL_TABLET | Freq: Every day | ORAL | Status: DC
  Administered 2018-09-25 – 2018-09-26 (×2): 50 mg via ORAL
  Filled 2018-09-24 (×2): qty 1

## 2018-09-24 MED ORDER — SODIUM CHLORIDE 0.9% FLUSH: 3.0000 mL | INTRAVENOUS | Status: DC | PRN

## 2018-09-24 MED ORDER — FENTANYL CITRATE (PF) 100 MCG/2ML IJ SOLN
INTRAMUSCULAR | Status: AC
  Filled 2018-09-24: qty 2

## 2018-09-24 MED ORDER — HYDRALAZINE HCL 20 MG/ML IJ SOLN
INTRAMUSCULAR | Status: AC
  Filled 2018-09-24: qty 1

## 2018-09-24 MED ORDER — ACETAMINOPHEN 325 MG PO TABS: 650.0000 mg | ORAL_TABLET | ORAL | Status: DC | PRN

## 2018-09-24 MED ORDER — TERAZOSIN HCL 5 MG PO CAPS
5.0000 mg | ORAL_CAPSULE | Freq: Every day | ORAL | Status: DC
  Administered 2018-09-24 – 2018-09-26 (×3): 5 mg via ORAL
  Filled 2018-09-24 (×3): qty 1

## 2018-09-24 MED ORDER — LORATADINE 10 MG PO TABS
10.0000 mg | ORAL_TABLET | Freq: Every day | ORAL | Status: DC
  Administered 2018-09-25 – 2018-10-09 (×14): 10 mg via ORAL
  Filled 2018-09-24 (×14): qty 1

## 2018-09-24 MED ORDER — METOPROLOL SUCCINATE ER 25 MG PO TB24
25.0000 mg | ORAL_TABLET | Freq: Two times a day (BID) | ORAL | Status: DC
  Administered 2018-09-24 – 2018-09-26 (×5): 25 mg via ORAL
  Filled 2018-09-24 (×5): qty 1

## 2018-09-24 MED ORDER — BUPROPION HCL ER (XL) 300 MG PO TB24
300.0000 mg | ORAL_TABLET | Freq: Every day | ORAL | Status: DC
  Administered 2018-09-25 – 2018-10-09 (×14): 300 mg via ORAL
  Filled 2018-09-24: qty 1
  Filled 2018-09-24: qty 2
  Filled 2018-09-24: qty 1
  Filled 2018-09-24 (×2): qty 2
  Filled 2018-09-24 (×2): qty 1
  Filled 2018-09-24: qty 2
  Filled 2018-09-24: qty 1
  Filled 2018-09-24: qty 2
  Filled 2018-09-24: qty 1
  Filled 2018-09-24 (×3): qty 2
  Filled 2018-09-24: qty 1

## 2018-09-24 MED ORDER — ONDANSETRON HCL 4 MG/2ML IJ SOLN: 4.0000 mg | Freq: Four times a day (QID) | INTRAMUSCULAR | Status: DC | PRN

## 2018-09-24 MED ORDER — HYDRALAZINE HCL 20 MG/ML IJ SOLN
10.0000 mg | Freq: Once | INTRAMUSCULAR | Status: AC
  Administered 2018-09-24: 10 mg via INTRAVENOUS

## 2018-09-24 MED ORDER — SODIUM CHLORIDE 0.9 % WEIGHT BASED INFUSION: 1.0000 mL/kg/h | INTRAVENOUS | Status: DC

## 2018-09-24 MED ORDER — VERAPAMIL HCL 2.5 MG/ML IV SOLN
INTRAVENOUS | Status: AC
  Filled 2018-09-24: qty 2

## 2018-09-24 MED ORDER — DILTIAZEM HCL ER COATED BEADS 180 MG PO CP24
180.0000 mg | ORAL_CAPSULE | Freq: Every day | ORAL | Status: DC
  Administered 2018-09-25 – 2018-09-26 (×2): 180 mg via ORAL
  Filled 2018-09-24 (×2): qty 1

## 2018-09-24 MED ORDER — ASPIRIN 81 MG PO CHEW
81.0000 mg | CHEWABLE_TABLET | ORAL | Status: AC
  Administered 2018-09-24: 81 mg via ORAL

## 2018-09-24 MED ORDER — SODIUM CHLORIDE 0.9 % WEIGHT BASED INFUSION
3.0000 mL/kg/h | INTRAVENOUS | Status: DC
  Administered 2018-09-24: 3 mL/kg/h via INTRAVENOUS

## 2018-09-24 MED ORDER — SODIUM CHLORIDE 0.9 % IV SOLN: INTRAVENOUS | Status: AC

## 2018-09-24 MED ORDER — HEPARIN (PORCINE) 25000 UT/250ML-% IV SOLN
1100.0000 [IU]/h | INTRAVENOUS | Status: DC
  Administered 2018-09-24: 18:00:00 850 [IU]/h via INTRAVENOUS
  Administered 2018-09-25: 950 [IU]/h via INTRAVENOUS
  Administered 2018-09-26: 22:00:00 1100 [IU]/h via INTRAVENOUS
  Filled 2018-09-24 (×4): qty 250

## 2018-09-24 MED ORDER — ASPIRIN 81 MG PO CHEW
CHEWABLE_TABLET | ORAL | Status: AC
  Administered 2018-09-24: 81 mg via ORAL
  Filled 2018-09-24: qty 1

## 2018-09-24 MED ORDER — MIDAZOLAM HCL 2 MG/2ML IJ SOLN
INTRAMUSCULAR | Status: DC | PRN
  Administered 2018-09-24 (×2): 1 mg via INTRAVENOUS

## 2018-09-24 MED ORDER — IOHEXOL 350 MG/ML SOLN
INTRAVENOUS | Status: DC | PRN
  Administered 2018-09-24: 85 mL via INTRAVENOUS

## 2018-09-24 MED ORDER — MILRINONE LACTATE IN DEXTROSE 20-5 MG/100ML-% IV SOLN
0.1250 ug/kg/min | INTRAVENOUS | Status: DC
  Administered 2018-09-24 – 2018-09-26 (×3): 0.125 ug/kg/min via INTRAVENOUS
  Filled 2018-09-24 (×5): qty 100

## 2018-09-24 MED ORDER — METFORMIN HCL 1000 MG PO TABS: 1000.0000 mg | ORAL_TABLET | Freq: Two times a day (BID) | ORAL | Status: DC

## 2018-09-24 MED ORDER — LIDOCAINE HCL (PF) 1 % IJ SOLN
INTRAMUSCULAR | Status: DC | PRN
  Administered 2018-09-24 (×2): 2 mL via INTRADERMAL

## 2018-09-24 MED ORDER — ACETAMINOPHEN 325 MG PO TABS: 650.0000 mg | ORAL_TABLET | Freq: Four times a day (QID) | ORAL | Status: DC | PRN

## 2018-09-24 MED ORDER — HEPARIN (PORCINE) IN NACL 1000-0.9 UT/500ML-% IV SOLN
INTRAVENOUS | Status: DC | PRN
  Administered 2018-09-24 (×2): 500 mL

## 2018-09-24 MED ORDER — HEPARIN (PORCINE) IN NACL 1000-0.9 UT/500ML-% IV SOLN
INTRAVENOUS | Status: AC
  Filled 2018-09-24: qty 1000

## 2018-09-24 MED ORDER — SODIUM CHLORIDE 0.9% FLUSH
3.0000 mL | Freq: Two times a day (BID) | INTRAVENOUS | Status: DC
  Administered 2018-09-25: 09:00:00 3 mL via INTRAVENOUS

## 2018-09-24 MED ORDER — LIDOCAINE HCL (PF) 1 % IJ SOLN
INTRAMUSCULAR | Status: AC
  Filled 2018-09-24: qty 30

## 2018-09-24 SURGICAL SUPPLY — 13 items
CATH 5FR JL3.5 JR4 ANG PIG MP (CATHETERS) ×2 IMPLANT
CATH BALLN WEDGE 5F 110CM (CATHETERS) ×2 IMPLANT
CATH INFINITI 5FR JL4 (CATHETERS) ×2 IMPLANT
DEVICE RAD COMP TR BAND LRG (VASCULAR PRODUCTS) ×2 IMPLANT
GLIDESHEATH SLEND SS 6F .021 (SHEATH) ×2 IMPLANT
GUIDEWIRE INQWIRE 1.5J.035X260 (WIRE) ×1 IMPLANT
INQWIRE 1.5J .035X260CM (WIRE) ×2
KIT HEART LEFT (KITS) ×2 IMPLANT
PACK CARDIAC CATHETERIZATION (CUSTOM PROCEDURE TRAY) ×2 IMPLANT
SHEATH GLIDE SLENDER 4/5FR (SHEATH) ×2 IMPLANT
SHEATH PROBE COVER 6X72 (BAG) ×2 IMPLANT
TRANSDUCER W/STOPCOCK (MISCELLANEOUS) ×2 IMPLANT
TUBING CIL FLEX 10 FLL-RA (TUBING) ×2 IMPLANT

## 2018-09-24 NOTE — Discharge Instructions (Addendum)

## 2018-09-24 NOTE — Progress Notes (Signed)
[facility] consult called

## 2018-09-24 NOTE — Consult Note (Addendum)
Advanced Heart Failure Team Consult Note   Primary Physician: Aletha Halim., PA-C PCP-Cardiologist:  Sinclair Grooms, MD  Reason for Consultation: Pre-CABG optimization  HPI:    Doris Jefferson is seen today for evaluation of Pre-CABG optimization at the request of Dr. Irish Lack.   Doris Jefferson is a 77 y.o. female with Chronic diastolic CHF, HTN, CAD, PAF, h/o OSA on CPAP, DM 2, HLD, moderate AS, and obesity.   Seen in [facility] clinic 09/19/18. Complained of occasional dyspnea. Denied chest pain. Note says that patient had mild "pressure" on occasion, but when asked about this, patient states she has never had any. Reports compliance with CPAP. No changes to meds made at that clinic with plans for cath. Scheduled for VQ scan, but not yet done.   She presented today for planned [facility] which showed severely calcific three vessel disease, moderate Pulmonary HTN, and compensated hemodynamics as below. Plan for admission for TCTS evaluation to CABG Pertinent labs on admission include (most recent 09/19/18, admission labs pending) Cr 0.91, K 4.3, Hgb 11.7, Plts 149.   Overall, she is feeling good. Up front, she states she does not want to be here over Christmas, and if possible, she would like to come back for CABG after the holidays. She was her USOH op to admit. She denies SOB with ADLs or grocery shopping with a Cart. She does have SOB walking around [employer] without a cart, or walking up steps. She denies CP, dizziness, lightheadedness or tachypalpitations, even with history of Afib. She doesn't take BP very often at home, but thinks it usually runs 130-140s. No orthopnea or PND. She takes lasix 20 mg daily, and hasn't needed any extra.   Her BP has been elevated since cath.   Echo 09/16/18 LVEF 55-60%, Grade 2 DD, Mild/Mod AS, Mod MR, mild LAE, RV mild dilated, Mild TR, PA peak pressure 79 mm Hg.   [facility] 09/24/18  Ost RCA to Prox RCA lesion is 75% stenosed.  Ost Cx to Prox Cx lesion  is 100% stenosed.  Dist LM lesion is 80% stenosed.  Ost 1st Diag lesion is 75% stenosed.  Ost Ramus lesion is 75% stenosed.  Mild to moderate aortic stenosis (Mean gradient 20 mm Hg) Prox LAD lesion is 50% stenosed. Hemodynamics (mmHg) RA mean 8 RV 76/1 PA 76/25 (45) PCWP 16 AO 146/55 LV 166/12 PVR 4.9 WU Cardiac Output (Fick) 5.86 Cardiac Index (Fick) 2.99  Review of Systems: [y] = yes, [ ]  = no   General: Weight gain [ ] ; Weight loss [ ] ; Anorexia [ ] ; Fatigue [ ] ; Fever [ ] ; Chills [ ] ; Weakness [ ]   Cardiac: Chest pain/pressure [ ] ; Resting SOB [ ] ; Exertional SOB [y]; Orthopnea [ ] ; Pedal Edema [ ] ; Palpitations [ ] ; Syncope [ ] ; Presyncope [ ] ; Paroxysmal nocturnal dyspnea[ ]   Pulmonary: Cough [ ] ; Wheezing[ ] ; Hemoptysis[ ] ; Sputum [ ] ; Snoring [ ]   GI: Vomiting[ ] ; Dysphagia[ ] ; Melena[ ] ; Hematochezia [ ] ; Heartburn[ ] ; Abdominal pain [ ] ; Constipation [ ] ; Diarrhea [ ] ; BRBPR [ ]   GU: Hematuria[ ] ; Dysuria [ ] ; Nocturia[ ]   Vascular: Pain in legs with walking [ ] ; Pain in feet with lying flat [ ] ; Non-healing sores [ ] ; Stroke [ ] ; TIA [ ] ; Slurred speech [ ] ;  Neuro: Headaches[ ] ; Vertigo[ ] ; Seizures[ ] ; Paresthesias[ ] ;Blurred vision [ ] ; Diplopia [ ] ; Vision changes [ ]   Ortho/Skin: Arthritis [y]; Joint pain [y]; Muscle  pain [ ] ; Joint swelling [ ] ; Back Pain [ ] ; Rash [ ]   Psych: Depression[ ] ; Anxiety[ ]   Heme: Bleeding problems [ ] ; Clotting disorders [ ] ; Anemia [ ]   Endocrine: Diabetes [ ] ; Thyroid dysfunction[ ]   Home Medications Prior to Admission medications   Medication Sig Start Date End Date Taking? Authorizing Provider  acetaminophen (TYLENOL) 325 MG tablet Take 650 mg by mouth every 6 (six) hours as needed for moderate pain or headache.   Yes [provider]  amiodarone (PACERONE) 200 MG tablet TAKE 1 TABLET(200 MG) BY MOUTH DAILY Patient taking differently: Take 200 mg by mouth daily.  10/16/17  Yes Belva Crome, MD  buPROPion (WELLBUTRIN  XL) 300 MG 24 hr tablet Take 300 mg by mouth daily.  01/11/16  Yes [provider]  diltiazem (CARDIZEM CD) 180 MG 24 hr capsule Take 180 mg by mouth daily. 03/27/17  Yes [provider]  doxylamine, Sleep, (UNISOM) 25 MG tablet Take 25 mg by mouth at bedtime as needed for sleep.   Yes [provider]  ELIQUIS 5 MG TABS tablet TAKE 1 TABLET(5 MG) BY MOUTH TWICE DAILY Patient taking differently: Take 5 mg by mouth 2 (two) times daily.  05/20/18  Yes Belva Crome, MD  fexofenadine (ALLEGRA) 60 MG tablet Take 60 mg by mouth daily as needed for allergies.    Yes [provider]  furosemide (LASIX) 20 MG tablet TAKE 1 TABLET BY MOUTH DAILY Patient taking differently: Take 20 mg by mouth daily.  10/16/17  Yes Belva Crome, MD  glimepiride (AMARYL) 4 MG tablet Take 4 mg by mouth daily with breakfast.   Yes [provider]  liraglutide (VICTOZA) 18 MG/3ML SOPN Inject 1.8 mg into the skin daily.    Yes [provider]  losartan (COZAAR) 50 MG tablet Take one tablet by mouth by daily Patient taking differently: Take 50 mg by mouth daily. Take one tablet by mouth by daily 09/11/18  Yes Belva Crome, MD  MAGNESIUM-OXIDE 400 (241.3 Mg) MG tablet TAKE 1 TABLET BY MOUTH TWICE DAILY Patient taking differently: Take 400 mg by mouth 2 (two) times daily.  06/18/18  Yes Belva Crome, MD  metFORMIN (GLUCOPHAGE) 1000 MG tablet Take 1,000 mg by mouth 2 (two) times daily with a meal.   Yes [provider]  metoprolol succinate (TOPROL-XL) 25 MG 24 hr tablet TAKE 1 TABLET(25 MG) BY MOUTH TWICE DAILY Patient taking differently: Take 25 mg by mouth 2 (two) times daily.  06/13/18  Yes Belva Crome, MD  rosuvastatin (CRESTOR) 20 MG tablet Take 20 mg by mouth daily.   Yes [provider]  terazosin (HYTRIN) 5 MG capsule Take 5 mg by mouth at bedtime.   Yes [provider]    Past Medical History: Past Medical History:  Diagnosis Date  .  Anemia   . Diabetes ([place])   . Heart murmur   . HTN (hypertension)   . Hyperlipidemia   . Obesity (BMI 30-39.9) 03/04/2014  . OSA (obstructive sleep apnea)    severe with AHI 31/hr  . PAF (paroxysmal atrial fibrillation) ([place])     Past Surgical History: Past Surgical History:  Procedure Laterality Date  . ABDOMINAL HYSTERECTOMY    . CARDIOVERSION N/A 12/19/2013   Procedure: CARDIOVERSION;  Surgeon: Sinclair Grooms, MD;  Location: [facility] ENDOSCOPY;  Service: Cardiovascular;  Laterality: N/A;  . CARDIOVERSION N/A 08/16/2015   Procedure: CARDIOVERSION;  Surgeon: Elby Showers  Aundra Dubin, MD;  Location: [place];  Service: Cardiovascular;  Laterality: N/A;  . CARDIOVERSION N/A 09/13/2015   Procedure: CARDIOVERSION;  Surgeon: Sueanne Margarita, MD;  Location: [place];  Service: Cardiovascular;  Laterality: N/A;  . COLONOSCOPY WITH PROPOFOL N/A 11/13/2017   Procedure: COLONOSCOPY WITH PROPOFOL;  Surgeon: Wilford Corner, MD;  Location: [facility] ENDOSCOPY;  Service: Endoscopy;  Laterality: N/A;  ANTIBIOTIC NEEDED BEFORE PROCEDURE  . ESOPHAGOGASTRODUODENOSCOPY (EGD) WITH PROPOFOL N/A 11/13/2017   Procedure: ESOPHAGOGASTRODUODENOSCOPY (EGD) WITH PROPOFOL;  Surgeon: Wilford Corner, MD;  Location: [facility] ENDOSCOPY;  Service: Endoscopy;  Laterality: N/A;  . EYE SURGERY    . SPINE SURGERY      Family History: Family History  Problem Relation Age of Onset  . Hypertension Father   . Heart disease Father   . Melanoma Mother   . Hypertension Brother     Social History: Social History   Socioeconomic History  . Marital status: Married    Spouse name: Not on file  . Number of children: Not on file  . Years of education: Not on file  . Highest education level: Not on file  Occupational History  . Not on file  Social Needs  . Financial resource strain: Not on file  . Food insecurity:    Worry: Not on file    Inability: Not on file  . Transportation needs:    Medical: Not on file    Non-medical: Not  on file  Tobacco Use  . Smoking status: Never Smoker  . Smokeless tobacco: Never Used  Substance and Sexual Activity  . Alcohol use: No  . Drug use: No  . Sexual activity: Never  Lifestyle  . Physical activity:    Days per week: Not on file    Minutes per session: Not on file  . Stress: Not on file  Relationships  . Social connections:    Talks on phone: Not on file    Gets together: Not on file    Attends religious service: Not on file    Active member of club or organization: Not on file    Attends meetings of clubs or organizations: Not on file    Relationship status: Not on file  Other Topics Concern  . Not on file  Social History Narrative  . Not on file    Allergies:  Allergies  Allergen Reactions  . Minocin [Minocycline Hcl] Swelling and Other (See Comments)    THROAT SWELLING  . Benazepril Cough  . Hydrochlorothiazide Itching  . Tape Rash    Objective:    Vital Signs:   Temp:  [98.7 F (37.1 C)] 98.7 F (37.1 C) (12/17 0548) Pulse Rate:  [0-69] 0 (12/17 0829) Resp:  [8-24] 9 (12/17 0829) BP: (138-187)/(54-82) 158/82 (12/17 0829) SpO2:  [97 %-100 %] 98 % (12/17 0829) Weight:  [93.9 kg] 93.9 kg (12/17 0548)    Weight change: Filed Weights   09/24/18 0548  Weight: 93.9 kg    Intake/Output:  No intake or output data in the 24 hours ending 09/24/18 0955    Physical Exam    General:  Elderly appearing. NAD.  HEENT: normal Neck: supple. JVP ~8-9 cm. Carotids 2+ bilat; no bruits. No lymphadenopathy or thyromegaly appreciated. Cor: PMI nondisplaced. Regular rate & rhythm. No rubs, gallops or murmurs. Lungs: clear Abdomen: soft, nontender, nondistended. No hepatosplenomegaly. No bruits or masses. Good bowel sounds. Extremities: no cyanosis, clubbing, rash, or edema. Neuro: alert & orientedx3, cranial nerves grossly intact. moves all 4 extremities  w/o difficulty. Affect pleasant  Telemetry   NSR 60-70s, personally reviewed.   EKG    09/19/18  NSR 70 bpm, personally reviewed  Labs   Basic Metabolic Panel: Recent Labs  Lab 09/19/18 1053  NA 144  K 4.3  CL 101  CO2 21  GLUCOSE 268*  BUN 15  CREATININE 0.91  CALCIUM 9.1    Liver Function Tests: No results for input(s): AST, ALT, ALKPHOS, BILITOT, PROT, ALBUMIN in the last 168 hours. No results for input(s): LIPASE, AMYLASE in the last 168 hours. No results for input(s): AMMONIA in the last 168 hours.  CBC: Recent Labs  Lab 09/19/18 1053  WBC 7.2  HGB 11.7  HCT 36.4  MCV 89  PLT 149*    Cardiac Enzymes: No results for input(s): CKTOTAL, CKMB, CKMBINDEX, TROPONINI in the last 168 hours.  BNP: BNP (last 3 results) No results for input(s): BNP in the last 8760 hours.  ProBNP (last 3 results) No results for input(s): PROBNP in the last 8760 hours.   CBG: Recent Labs  Lab 09/24/18 0555 09/24/18 0847  GLUCAP 198* 148*    Coagulation Studies: No results for input(s): LABPROT, INR in the last 72 hours.   Imaging    No results found.   Medications:     Current Medications: . sodium chloride flush  3 mL Intravenous Q12H     Infusions: . sodium chloride    . sodium chloride 75 mL/hr at 09/24/18 0901  . sodium chloride 1 mL/kg/hr (09/24/18 0720)       Patient Profile   LAMEES GABLE is a 77 y.o. female with Chronic diastolic CHF, HTN, CAD, PAF, h/o OSA on CPAP, DM 2, HLD, moderate AS, and obesity.   Asked to see by [facility] for optimization prior to CABG.  Assessment/Plan   1. Severe 3v CAD - As cath above. Severe with 75% RCA, 100% Cx, 80% LM and LAD 50% stenosed - TCTS to see for CABG consideration.  - Continue statin.  2. Chronic diastolic CHF - Echo 70/1/77 LVEF 55-60%, Grade 2 DD, Mild/Mod AS, Mod MR, mild LAE, RV mild dilated, Mild TR, PA peak pressure 79 mm Hg.  - Volume status stable by cath - Continue lasix 20 mg daily - Resume losartan. - Continue Toprol XL 25 mg BID  3. Moderate Pulmonary HTN - By cath as above  with PA pressure PA 76/25 mm/Hg (Mean 45)  - Likely WHO Group III in setting of OSA +/- component of Group II with diastolic CHF. Suspect unlikely to benefit from pulmonary vasodilators.  - Would recommend continued CPAP titration if needed, and weight loss.   4. PAF - NSR this admit - Will need to watch for post-op Afib. Follow on tele.  - Continue diltiazem 180 mg daily. - Continue amiodarone 200 mg daily - Holding eliquis with plans for surgery.  5. HTN - Meds as above.   6. OSA on CPAP - Continue CPAP.   7. DM 2 - Sliding scale while in house.   8. Obesity - Body mass index is 36.67 kg/m.  - Encouraged weight loss.   Medication concerns reviewed with patient and pharmacy team. Barriers identified: None at this time.   Length of Stay: 0  Annamaria Helling  09/24/2018, 9:55 AM  Advanced Heart Failure Team Pager [phone] (M-F; 7a - 4p)  Please contact [facility] for night-coverage after hours (4p -7a ) and weekends on [email or web address]  Patient seen and examined with the  above-signed Engineer, building services. I personally reviewed laboratory data, imaging studies and relevant notes. I independently examined the patient and formulated the important aspects of the plan. I have edited the note to reflect any of my changes or salient points. I have personally discussed the plan with the patient and/or family.  77 y/o woman with morbid obesity, OSA, chronic diastolic HF, mild to moderate AS, PAF and DM2. Recently underwent echo to reassess AoV. Cath arranged for today which showed severe 3vCAD, mild to moderate AS and moderate PAH.   At baseline she is relatively functional - able to all ADLs and go to store as long as she holds onto the cart. No syncope or presyncope. Wears CPAP religiously.   On exam she is comfortable in chair. Quite hypertensive with SBP 200 JVP 8 Carotids 2+ with bilateral bruits Cor RRR 2/6 AS s2 moderately decreased.  Lungs  CTA Ab obese NT/ND Ext warm no edema  Overall seems to be acceptable risk for CABG/AVR +/- [place]. She does have moderate PAH which is out of proportion to her left-sided pressures. However RV normal on echo and cardiac output [preserved. Suspect this is related to obesity and OSA. On tele her rhythm looks like it may be a slow atrial tach (versus prominent U waves) - will repeat ECG.   She has been referred for inpatient surgery but is adamant that she does not want to stay and wants to deal with this after the holidays as her family is coming in from various parts of the [place]. I suspect she is likely stable for this.   We discussed possible addition of sildenafil but I think the better plan may be to have her see TCTS as outpatient. Once approved for surgery would bring her in a day or two early for RHC and we can optimize pre-cath with milrinone.   Glori Bickers, MD  12:42 PM

## 2018-09-24 NOTE — Progress Notes (Signed)
HTNsive, given IV hydralazine, metoprolol for 2200 given early.  Left hand infusing milrinone and heparin, has some redness, contacted card PA to examine.  IV site very positional, IV team requested to place a second site.

## 2018-09-24 NOTE — Consult Note (Addendum)
[address]       [place] [address]             [phone]        Mck[facility] O [facility] Medical Record #[id number] Date of Birth: March 30, 1941  Referring: Dr. Irish Lack, MD Primary Care: Aletha Halim., PA-C Primary Cardiologist:Henry Nicholes Stairs III, MD  Chief Complaint: Patient presents today for right and left cardiac catheterization as recent repeat echo showed worsening PASP   Reason for consultation: Moderate to severe aortic stenosis and coronary artery disease (significant left main disease)  History of Present Illness:     This is a 77 year old Caucasian female with a past medical history of hypertension, hyperlipidemia, OSA, diabetes mellitus, obesity atrial fibrillation, aortic stenosis, and pulmonary hypertension who presents today for a right and left heart catheterization. The patient had a recent echo done 09/16/2018 which showed a LVEF 55-60%,  Grade 2 diastolic dysfunction, mild to moderate aortic stenosis, moderate MR, mild TR, and severely increased PA peak pressure to 78 mm Hg. Cardiac catheterization done showed severe calcific 3 vessel disease (80% left main disease included) and mild aortic stenosis. Dr. Haroldine Laws was consulted to address elevated PA pressure. Per Dr. Haroldine Laws, patient overall seems to be acceptable risk for CABG/AVR +/- Maze. She does have moderate PAH, which is out of proportion to her left-sided pressures. However,RV normal on echo and cardiac output preserved. Dr. Haroldine Laws suspecst this is related to obesity and OSA. Patient states she is able to do her activities of daily living. She only has shortness of breath when she exerts herself I.le. cleaning. She uses a cart to help steady herself in the grocery store and at [place]. She denies chest pain, nausea, diaphoresis, or syncope. Patient wishes to return for heart surgery after the holidays.  We will await cardiology recommendation as to whether patient will have heart  surgery on this admission or whether it will be in January of 2020.  Current Activity/ Functional Status: Patient is independent with mobility/ambulation, transfers, ADL's, IADL's.   Zubrod Score: At the time of surgery this patient's most appropriate activity status/level should be described as: []     0    Normal activity, no symptoms [x]     1    Restricted in physical strenuous activity but ambulatory, able to do out light work []     2    Ambulatory and capable of self care, unable to do work activities, up and about                 more than 50%  Of the time                            []     3    Only limited self care, in bed greater than 50% of waking hours []     4    Completely disabled, no self care, confined to bed or chair []     5    Moribund  Past Medical History:  Diagnosis Date  . Anemia   . Diabetes ([place])   . Heart murmur   . HTN (hypertension)   . Hyperlipidemia   . Obesity (BMI 30-39.9) 03/04/2014  . OSA (obstructive sleep apnea)    severe with AHI 31/hr  . PAF (paroxysmal atrial fibrillation) ([place])     Past Surgical History:  Procedure Laterality Date  . ABDOMINAL HYSTERECTOMY    .  CARDIOVERSION N/A 12/19/2013   Procedure: CARDIOVERSION;  Surgeon: Sinclair Grooms, MD;  Location: [facility] ENDOSCOPY;  Service: Cardiovascular;  Laterality: N/A;  . CARDIOVERSION N/A 08/16/2015   Procedure: CARDIOVERSION;  Surgeon: Larey Dresser, MD;  Location: [facility];  Service: Cardiovascular;  Laterality: N/A;  . CARDIOVERSION N/A 09/13/2015   Procedure: CARDIOVERSION;  Surgeon: Sueanne Margarita, MD;  Location: [facility] [facility];  Service: Cardiovascular;  Laterality: N/A;  . COLONOSCOPY WITH PROPOFOL N/A 11/13/2017   Procedure: COLONOSCOPY WITH PROPOFOL;  Surgeon: Wilford Corner, MD;  Location: [facility] ENDOSCOPY;  Service: Endoscopy;  Laterality: N/A;  ANTIBIOTIC NEEDED BEFORE PROCEDURE  . ESOPHAGOGASTRODUODENOSCOPY (EGD) WITH PROPOFOL N/A 11/13/2017   Procedure: ESOPHAGOGASTRODUODENOSCOPY  (EGD) WITH PROPOFOL;  Surgeon: Wilford Corner, MD;  Location: [facility] ENDOSCOPY;  Service: Endoscopy;  Laterality: N/A;  . EYE SURGERY    . SPINE SURGERY      Social History   Tobacco Use  Smoking Status Never Smoker  Smokeless Tobacco Never Used    Social History   Substance and Sexual Activity  Alcohol Use No  She has been married for 77 years and has 3 children. One lives in [place], one in [place], and one in [place].   Allergies  Allergen Reactions  . Minocin [Minocycline Hcl] Swelling and Other (See Comments)    THROAT SWELLING  . Benazepril Cough  . Hydrochlorothiazide Itching  . Tape Rash    Current Facility-Administered Medications  Medication Dose Route Frequency Provider Last Rate Last Dose  . 0.9 %  sodium chloride infusion  250 mL Intravenous PRN Lendon Colonel, NP      . 0.9 %  sodium chloride infusion   Intravenous Continuous Jettie Booze, MD 75 mL/hr at 09/24/18 0901    . 0.9% sodium chloride infusion  1 mL/kg/hr Intravenous Continuous Lendon Colonel, NP 94.8 mL/hr at 09/24/18 0720 1 mL/kg/hr at 09/24/18 0720  . amiodarone (PACERONE) tablet 200 mg  200 mg Oral Daily Jettie Booze, MD      . diltiazem (CARDIZEM CD) 24 hr capsule 180 mg  180 mg Oral Daily Jettie Booze, MD      . Derrill Memo ON 09/25/2018] furosemide (LASIX) tablet 20 mg  20 mg Oral Daily Jettie Booze, MD      . heparin ADULT infusion 100 units/mL (25000 units/264mL sodium chloride 0.45%)  850 Units/hr Intravenous Continuous Jettie Booze, MD      . liraglutide (VICTOZA) SOPN 1.8 mg  1.8 mg Subcutaneous Daily Larae Grooms S, MD      . loratadine (CLARITIN) tablet 10 mg  10 mg Oral Daily Jettie Booze, MD      . losartan (COZAAR) tablet 50 mg  50 mg Oral Daily Jettie Booze, MD      . metoprolol succinate (TOPROL-XL) 24 hr tablet 25 mg  25 mg Oral BID Larae Grooms S, MD      . rosuvastatin (CRESTOR) tablet 20 mg  20 mg Oral Daily  Larae Grooms S, MD      . sodium chloride flush (NS) 0.9 % injection 3 mL  3 mL Intravenous Q12H Lendon Colonel, NP      . sodium chloride flush (NS) 0.9 % injection 3 mL  3 mL Intravenous PRN Lendon Colonel, NP        Medications Prior to Admission  Medication Sig Dispense Refill Last Dose  . acetaminophen (TYLENOL) 325 MG tablet Take 650 mg by mouth every 6 (six) hours as needed  for moderate pain or headache.   Taking  . amiodarone (PACERONE) 200 MG tablet TAKE 1 TABLET(200 MG) BY MOUTH DAILY (Patient taking differently: Take 200 mg by mouth daily. ) 90 tablet 3 09/24/2018 at 0430  . buPROPion (WELLBUTRIN XL) 300 MG 24 hr tablet Take 300 mg by mouth daily.    09/24/2018 at 0430  . diltiazem (CARDIZEM CD) 180 MG 24 hr capsule Take 180 mg by mouth daily.   09/24/2018 at 0430  . doxylamine, Sleep, (UNISOM) 25 MG tablet Take 25 mg by mouth at bedtime as needed for sleep.   09/23/2018 at 2200  . fexofenadine (ALLEGRA) 60 MG tablet Take 60 mg by mouth daily as needed for allergies.    09/23/2018 at Unknown time  . furosemide (LASIX) 20 MG tablet TAKE 1 TABLET BY MOUTH DAILY (Patient taking differently: Take 20 mg by mouth daily. ) 90 tablet 3 09/23/2018 at Unknown time  . glimepiride (AMARYL) 4 MG tablet Take 4 mg by mouth daily with breakfast.   09/23/2018 at Unknown time  . liraglutide (VICTOZA) 18 MG/3ML SOPN Inject 1.8 mg into the skin daily.    09/23/2018 at Unknown time  . losartan (COZAAR) 50 MG tablet Take one tablet by mouth by daily (Patient taking differently: Take 50 mg by mouth daily. Take one tablet by mouth by daily) 90 tablet 0 09/24/2018 at 0430  . MAGNESIUM-OXIDE 400 (241.3 Mg) MG tablet TAKE 1 TABLET BY MOUTH TWICE DAILY (Patient taking differently: Take 400 mg by mouth 2 (two) times daily. ) 180 tablet 3 09/24/2018 at 0430  . metoprolol succinate (TOPROL-XL) 25 MG 24 hr tablet TAKE 1 TABLET(25 MG) BY MOUTH TWICE DAILY (Patient taking differently: Take 25 mg by mouth  2 (two) times daily. ) 180 tablet 3 09/24/2018 at 0430  . rosuvastatin (CRESTOR) 20 MG tablet Take 20 mg by mouth daily.   09/23/2018 at 2100  . terazosin (HYTRIN) 5 MG capsule Take 5 mg by mouth at bedtime.   09/23/2018 at 2100  . [DISCONTINUED] ELIQUIS 5 MG TABS tablet TAKE 1 TABLET(5 MG) BY MOUTH TWICE DAILY (Patient taking differently: Take 5 mg by mouth 2 (two) times daily. ) 60 tablet 10 09/21/2018 at 2130  . [DISCONTINUED] metFORMIN (GLUCOPHAGE) 1000 MG tablet Take 1,000 mg by mouth 2 (two) times daily with a meal.   09/23/2018 at Unknown time    Family History  Problem Relation Age of Onset  . Hypertension, Oat cell, heart disease Father   . Melanoma Mother   . Hypertension Brother   Heart disease, MI, heart surgery                    Brother  Review of Systems:      Cardiac Review of Systems: Y or  [ Y   ]= no  Chest Pain [  N  ]  Resting SOB [ N  ] Exertional SOB  [Y  ]     Pedal Edema [ N  ]    yncope  [ N ]   Presyncope [ N  ]  General Review of Systems: [Y] = yes [  ]=no Constitional:  fatigue [ Y ]; nausea Aqua.Slicker  ]; night sweats [ N ]; fever [ N ]; or chills [ N ]  Dental: Last Dentist visit: 2 times per year  Eye : Amaurosis fugax[ N ]; Resp: cough [N  ];  wheezing[N  ];  hemoptysis[  N];  GI: vomiting[ N ];  dysphagia[  ]; melena[  ];  hematochezia Aqua.Slicker  ];  GU:  hematuria[ N ];   dysuria Aqua.Slicker  ];               Skin:   peripheral edema[N  ];  Neuro: TIA[ N ];   stroke[ N ];  vertigo[ N ];  seizures[N  ];     Psych:depression[N]; anxiety[ N ];  Endocrine: diabetes[ Y ];  thyroid dysfunction[ N ];                     Physical Exam: BP (!) 203/48   Pulse 61   Temp 98.7 F (37.1 C) (Oral)   Resp 20   Ht 5\' 3"  (1.6 m)   Wt 93.9 kg   SpO2 100%   BMI 36.67 kg/m    General appearance: alert, cooperative and no distress Head: Normocephalic, without obvious abnormality, atraumatic Neck: supple, symmetrical,  trachea midline Resp: clear to auscultation bilaterally Cardio: RRR, Grade III/VI systolic murmur that radiates to the carotid arteries GI: Soft, non tender, obese, bowel souds present Extremities: Trace LE edema, feet warm bilaterally Neurologic: Grossly normal  Diagnostic Studies & Laboratory data: Echo done on 09/16/2018:                            [facility]                        [address], [place] [address]                            [phone]  ------------------------------------------------------------------- Transthoracic Echocardiography  Patient:    Azlyn, Wingler MR #:       [id number] Study Date: 09/16/2018 Gender:     F Age:        15 Height:     160 cm Weight:     95.7 kg BSA:        2.11 m^2 Pt. Status: Room:   ORDERING     Fransico Him, MD  REFERRING    Fransico Him, MD  ATTENDING    Candee Furbish, M.D.  SONOGRAPHER  Cindy Hazy, RDCS  PERFORMING   [facility], Outpatient  cc:  ------------------------------------------------------------------- LV EF: 55% -   60%  ------------------------------------------------------------------- Indications:      I35.0 Aortic Stenosis.  ECHO WITH DEFINITY.  ------------------------------------------------------------------- History:   PMH:  Acquired from the patient and from the patient&'s chart.  PMH:  Anemia. Murmur. Obstructive sleep apnea. Paroxysmal atrial fibrillation.  Risk factors:  Hypertension. Diabetes mellitus. Obese. Dyslipidemia.  ------------------------------------------------------------------- Study Conclusions  - Left ventricle: The cavity size was normal. Systolic function was   normal. The estimated ejection fraction was in the range of 55%   to 60%. Constrast utilized. Wall motion was normal; there were no   regional wall motion abnormalities. Features are consistent with   a pseudonormal left ventricular filling pattern, with  concomitant   abnormal relaxation and increased filling pressure (grade 2   diastolic dysfunction). - Aortic  valve: There was mild to moderate stenosis. Peak velocity   (S): 290 cm/s. Mean gradient (S): 17 mm Hg. - Mitral valve: There was moderate regurgitation. - Left atrium: The atrium was mildly dilated. - Right ventricle: The cavity size was mildly dilated. Wall   thickness was normal. - Tricuspid valve: There was mild regurgitation. - Pulmonary arteries: Systolic pressure was severely increased. PA   peak pressure: 78 mm Hg (S).  ------------------------------------------------------------------- Study data:   Study status:  Routine.  Procedure:  The patient reported no pain pre or post test. Transthoracic echocardiography for left ventricular function evaluation, for right ventricular function evaluation, and for assessment of valvular function. Image quality was adequate.  Study completion:  There were no complications.          Transthoracic echocardiography.  M-mode, complete 2D, spectral Doppler, and color Doppler.  Birthdate: Patient birthdate: 01-14-1941.  Age:  Patient is 77 yr old.  Sex: Gender: female.    BMI: 37.4 kg/m^2.  Blood pressure:     126/72 Patient status:  Outpatient.  Study date:  Study date: 09/16/2018. Study time: 08:47 AM.  Location:  [facility]  -------------------------------------------------------------------  ------------------------------------------------------------------- Left ventricle:  The cavity size was normal. Systolic function was normal. The estimated ejection fraction was in the range of 55% to 60%. Constrast utilized. Wall motion was normal; there were no regional wall motion abnormalities. Features are consistent with a pseudonormal left ventricular filling pattern, with concomitant abnormal relaxation and increased filling pressure (grade 2 diastolic  dysfunction).  ------------------------------------------------------------------- Aortic valve:  Poorly visualized.  Trileaflet; moderately thickened, moderately calcified leaflets. Mobility was not restricted.  Doppler:   There was mild to moderate stenosis. There was no regurgitation.    VTI ratio of LVOT to aortic valve: 0.35. Valve area (VTI): 0.88 cm^2. Indexed valve area (VTI): 0.42 cm^2/m^2. Peak velocity ratio of LVOT to aortic valve: 0.32. Valve area (Vmax): 0.81 cm^2. Indexed valve area (Vmax): 0.38 cm^2/m^2. Mean velocity ratio of LVOT to aortic valve: 0.35. Valve area (Vmean): 0.88 cm^2. Indexed valve area (Vmean): 0.42 cm^2/m^2. Mean gradient (S): 17 mm Hg. Peak gradient (S): 34 mm Hg.  ------------------------------------------------------------------- Aorta:  Aortic root: The aortic root was normal in size.  ------------------------------------------------------------------- Mitral valve:   Moderately thickened, moderately calcified leaflets anterior greater than posterior. Mobility was not restricted. Doppler:  Transvalvular velocity was within the normal range. There was no evidence for stenosis. There was moderate regurgitation. Peak gradient (D): 11 mm Hg.  ------------------------------------------------------------------- Left atrium:  The atrium was mildly dilated.  ------------------------------------------------------------------- Right ventricle:  The cavity size was mildly dilated. Wall thickness was normal. Systolic function was normal.  ------------------------------------------------------------------- Pulmonic valve:   Poorly visualized.  Structurally normal valve. Cusp separation was normal.  Doppler:  Transvalvular velocity was within the normal range. There was no evidence for stenosis. There was no regurgitation.  ------------------------------------------------------------------- Tricuspid valve:   Structurally normal valve.     Doppler: Transvalvular velocity was within the normal range. There was mild regurgitation.  ------------------------------------------------------------------- Pulmonary artery:   The main pulmonary artery was normal-sized. Systolic pressure was severely increased.  ------------------------------------------------------------------- Right atrium:  The atrium was normal in size.  ------------------------------------------------------------------- Pericardium:  There was no pericardial effusion.  ------------------------------------------------------------------- Systemic veins: Inferior vena cava: The vessel was normal in size.     RIGHT/LEFT HEART CATH AND CORONARY ANGIOGRAPHY  Conclusion     Ost RCA to Prox RCA lesion is 75% stenosed.  Ost Cx to Prox Cx lesion is 100%  stenosed.  Dist LM lesion is 80% stenosed.  Ost 1st Diag lesion is 75% stenosed.  Ost Ramus lesion is 75% stenosed.  Prox LAD lesion is 50% stenosed.  The left ventricular systolic function is normal.  LV end diastolic pressure is normal.  The left ventricular ejection fraction is 55-65% by visual estimate.  There is mild aortic valve stenosis. Mean gradient 20 mm Hg.  Hemodynamic findings consistent with moderate pulmonary hypertension.  Ao sat 92%, PA sat 64%, mean PA pressure 45 mm Hg; mean PCWP 16 mm Hg; CO 5.86 L/min; CI 2.99   Severe calcific three vessel CAD.  Mild aortic stenosis.  Plan for cardiac surgery consult.  CHF service to address PA pressures.  D/w Dr. Haroldine Laws.   Start heparin 8 hours post sheath pull given that she has been on Eliquis.   Diagnostic  Dominance: Right  Left Main  Dist LM lesion 80% stenosed  Dist LM lesion is 80% stenosed. The lesion is calcified.  Left Anterior Descending  Prox LAD lesion 50% stenosed  Prox LAD lesion is 50% stenosed.  First Diagonal Branch  Ost 1st Diag lesion 75% stenosed  Ost 1st Diag lesion is 75% stenosed.  Ramus  Intermedius  Ost Ramus lesion 75% stenosed  Ost Ramus lesion is 75% stenosed.  Left Circumflex  Collaterals  Dist Cx filled by collaterals from Post Atrio.    Ost Cx to Prox Cx lesion 100% stenosed  Ost Cx to Prox Cx lesion is 100% stenosed.  Third Obtuse Marginal Branch  Collaterals  3rd Mrg filled by collaterals from 2nd Diag.    Right Coronary Artery  Ost RCA to Prox RCA lesion 75% stenosed  Ost RCA to Prox RCA lesion is 75% stenosed. The lesion is severely calcified. pressure dampening with catheter engagement  Intervention   No interventions have been documented.  Right Heart   Right Heart Pressures Hemodynamic findings consistent with moderate pulmonary hypertension. Ao sat 92%, PA sat 64%, mean PA pressure 45 mm Hg; mean PCWP 16 mm Hg; CO 5.86 L/min; CI 2.99  Wall Motion   Resting                Recent Radiology Findings:   No results found.   I have independently reviewed the above radiologic studies and discussed with the patient   Recent Lab Findings: Lab Results  Component Value Date   WBC 7.2 09/19/2018   HGB 11.7 09/19/2018   HCT 36.4 09/19/2018   PLT 149 (L) 09/19/2018   GLUCOSE 268 (H) 09/19/2018   CHOL 145 05/08/2018   TRIG 152 (H) 05/08/2018   HDL 43 05/08/2018   LDLCALC 72 05/08/2018   ALT 35 (H) 05/08/2018   AST 33 05/08/2018   NA 144 09/19/2018   K 4.3 09/19/2018   CL 101 09/19/2018   CREATININE 0.91 09/19/2018   BUN 15 09/19/2018   CO2 21 09/19/2018   TSH 3.000 05/08/2018   INR 1.37 12/10/2017   Assessment / Plan:   1. Coronary artery disease, aortic valve stenosis-would benefit from CABG, +/-AVR, (need to determine severity of aortic stenosis) +/- MAZE and LA clip. Surgery tentatively scheduled for Friday. 2. History of atrial fibrillation-has been cardioverted 3 times in the past. On Amiodarone 200 mg daily, Diltiazem CD 180 mg daily, and Apixaban 3. History of Diabetes mellitus-on Glimepiride 4 mg daily and Metformin 1000 mg bid,  and Liraglutide daily. 3. History of hypertension-on Toprol XL 25 mg daily 4. History of hyperlipidemia-on Crestor at  20 mg at hs 5. History of obesity 6. History of OSA-on CPAP 7. Chronic diastolic heart failure-on Lasix 20 mg daily and Losartan 50 mg daily at home. Will ask Dr. Haroldine Laws to try to optimize prior to surgery. Patient will be put on Milrinone drip as discussed with heart failure.   I  spent 15 minutes counseling the patient face to face.   Lars Pinks PA-C 09/24/2018 12:47 PM  Patient has been personally seen by me, symptoms are reviewed, I personally reviewed her echocardiograms and cardiac cath.  The patient has symptomatic coronary artery disease there is some discrepancy between the degree of her aortic stenosis by cath and echocardiogram, but I suspect is significant and contributing to her symptoms with concomitant coronary artery disease.  I have discussed with her proceeding with coronary artery bypass grafting, replacement of aortic valve with tissue valve, atrial clipping and possible maze with pulmonary isolation, she is agreeable.  Tentative Friday.  Grace Isaac MD      [address] [place], [address] Office [phone]   [place]

## 2018-09-24 NOTE — Progress Notes (Deleted)
Site area: RFA Site Prior to Removal:  Level 0 Pressure Applied For:25 min Manual:   yes Patient Status During Pull:  stable Post Pull Site:  Level 0 Post Pull Instructions Given:  yes Post Pull Pulses Present: palpable Dressing Applied:  clear Bedrest begins @ 1150 till 1550 Comments:

## 2018-09-24 NOTE — Interval H&P Note (Signed)
Cath Lab Visit (complete for each Cath Lab visit)  Clinical Evaluation Leading to the Procedure:   ACS: No.  Non-ACS:    Anginal Classification: CCS III  Anti-ischemic medical therapy: Minimal Therapy (1 class of medications)  Non-Invasive Test Results: Intermediate-risk stress test findings: cardiac mortality 1-3%/year; severe pulmonary HTN by echo  Prior CABG: No previous CABG    Right and left heart cath; aortic stenosis  History and Physical Interval Note:  09/24/2018 7:27 AM  Doris Jefferson  has presented today for surgery, with the diagnosis of abnormal echo, Aortic valve stenosis  The various methods of treatment have been discussed with the patient and family. After consideration of risks, benefits and other options for treatment, the patient has consented to  Procedure(s): RIGHT/LEFT HEART CATH AND CORONARY ANGIOGRAPHY (N/A) as a surgical intervention .  The patient's history has been reviewed, patient examined, no change in status, stable for surgery.  I have reviewed the patient's chart and labs.  Questions were answered to the patient's satisfaction.     Larae Grooms

## 2018-09-24 NOTE — Progress Notes (Signed)
ANTICOAGULATION CONSULT NOTE - Initial Consult  Pharmacy Consult for heparin Indication: chest pain/ACS  Allergies  Allergen Reactions  . Minocin [Minocycline Hcl] Swelling and Other (See Comments)    THROAT SWELLING  . Benazepril Cough  . Hydrochlorothiazide Itching  . Tape Rash    Patient Measurements: Height: 5\' 3"  (160 cm) Weight: 207 lb (93.9 kg) IBW/kg (Calculated) : 52.4 Heparin Dosing Weight: 74 kg  Vital Signs: Temp: 98.7 F (37.1 C) (12/17 0548) Temp Source: Oral (12/17 0548) BP: 203/48 (12/17 1145) Pulse Rate: 61 (12/17 1145)  Labs: No results for input(s): HGB, HCT, PLT, APTT, LABPROT, INR, HEPARINUNFRC, HEPRLOWMOCWT, CREATININE, CKTOTAL, CKMB, TROPONINI in the last 72 hours.  Estimated Creatinine Clearance: 56.4 mL/min (by C-G formula based on SCr of 0.91 mg/dL).   Medical History: Past Medical History:  Diagnosis Date  . Anemia   . Diabetes ([place])   . Heart murmur   . HTN (hypertension)   . Hyperlipidemia   . Obesity (BMI 30-39.9) 03/04/2014  . OSA (obstructive sleep apnea)    severe with AHI 31/hr  . PAF (paroxysmal atrial fibrillation) ([facility])     Medications:  Scheduled:  . amiodarone  200 mg Oral Daily  . diltiazem  180 mg Oral Daily  . [START ON 09/25/2018] furosemide  20 mg Oral Daily  . liraglutide  1.8 mg Subcutaneous Daily  . loratadine  10 mg Oral Daily  . losartan  50 mg Oral Daily  . metoprolol succinate  25 mg Oral BID  . rosuvastatin  20 mg Oral Daily  . sodium chloride flush  3 mL Intravenous Q12H    Assessment: 86 yof on apixaban PTA for Afib (last dose on 12/14), found to have severe 3 vessel CAD on cath - awaiting CTVS consult.   Plan to start heparin 8 hours post-sheath removal (documented on 12/17@0828 ). No s/sx of bleeding.  Will order baseline aPTT and heparin level - if correlate, given ~72 hrs since last dose, monitor heparin level.  Goal of Therapy:  Heparin level 0.3-0.7 units/ml aPTT 66-102 seconds Monitor  platelets by anticoagulation protocol: Yes   Plan:  Start heparin infusion at 850 units/hr on 12/17 at 1630 Check anti-Xa level in 8 hours and daily while on heparin Continue to monitor H&H and platelets  Antonietta Jewel, PharmD, [place] Clinical Pharmacist  Pager: [phone] Phone: [phone] 09/24/2018,12:46 PM

## 2018-09-24 NOTE — Progress Notes (Signed)
Pre CABG evaluation completed. Please see preliminary notes on CV PROC under chart review. Aubrey Blackard H Dustin Bumbaugh(RDMS RVT) 09/24/18 5:31 PM

## 2018-09-24 NOTE — Progress Notes (Signed)
TR BAND REMOVAL   LOCATION:    right radial  DEFLATED PER PROTOCOL:    Yes.    TIME BAND OFF / DRESSING APPLIED:    1130am  Gauze and tegaderm   SITE UPON ARRIVAL:    Level 0  SITE AFTER BAND REMOVAL:    Level 0  CIRCULATION SENSATION AND MOVEMENT:    Within Normal Limits   Yes.    COMMENTS:   Care instruction given to pt.

## 2018-09-24 NOTE — Progress Notes (Signed)
RT at bedside to talk to pt abut CPAP.  Pt stated she has slept without her CPAP before.  RT offered a CPAP for tonight.  Pt stated, her husband will be bringing her CPAP from home in the morning and she would rather wait to wear her CPAP tomorrow night.  Pt is on RA, sp02 is 98%. No AD noted.

## 2018-09-25 ENCOUNTER — Encounter (HOSPITAL_COMMUNITY): Payer: Self-pay | Admitting: Interventional Cardiology

## 2018-09-25 DIAGNOSIS — I272 Pulmonary hypertension, unspecified: Secondary | ICD-10-CM

## 2018-09-25 DIAGNOSIS — I200 Unstable angina: Secondary | ICD-10-CM

## 2018-09-25 LAB — GLUCOSE, CAPILLARY
GLUCOSE-CAPILLARY: 242 mg/dL — AB (ref 70–99)
Glucose-Capillary: 174 mg/dL — ABNORMAL HIGH (ref 70–99)
Glucose-Capillary: 153 mg/dL — ABNORMAL HIGH (ref 70–99)
Glucose-Capillary: 213 mg/dL — ABNORMAL HIGH (ref 70–99)

## 2018-09-25 LAB — CBC
HCT: 35.3 % — ABNORMAL LOW (ref 36.0–46.0)
HEMOGLOBIN: 11.2 g/dL — AB (ref 12.0–15.0)
MCH: 28.7 pg (ref 26.0–34.0)
MCHC: 31.7 g/dL (ref 30.0–36.0)
MCV: 90.5 fL (ref 80.0–100.0)
Platelets: 131 10*3/uL — ABNORMAL LOW (ref 150–400)
RBC: 3.9 MIL/uL (ref 3.87–5.11)
RDW: 14 % (ref 11.5–15.5)
WBC: 8.3 10*3/uL (ref 4.0–10.5)
nRBC: 0 % (ref 0.0–0.2)

## 2018-09-25 LAB — URINALYSIS, ROUTINE W REFLEX MICROSCOPIC
Bacteria, UA: NONE SEEN
Bilirubin Urine: NEGATIVE
Glucose, UA: >=500 mg/dL — AB
Hgb urine dipstick: NEGATIVE
Ketones, ur: NEGATIVE mg/dL
Leukocytes, UA: NEGATIVE
Nitrite: NEGATIVE
Protein, ur: NEGATIVE mg/dL
Specific Gravity, Urine: 1.027 (ref 1.005–1.030)
pH: 5 (ref 5.0–8.0)

## 2018-09-25 LAB — BASIC METABOLIC PANEL
Anion gap: 9 (ref 5–15)
BUN: 14 mg/dL (ref 8–23)
CO2: 22 mmol/L (ref 22–32)
Calcium: 8.8 mg/dL — ABNORMAL LOW (ref 8.9–10.3)
Chloride: 111 mmol/L (ref 98–111)
Creatinine, Ser: 0.82 mg/dL (ref 0.44–1.00)
GFR calc non Af Amer: >60 mL/min (ref >60)
Glucose, Bld: 155 mg/dL — ABNORMAL HIGH (ref 70–99)
Potassium: 3.5 mmol/L (ref 3.5–5.1)
SODIUM: 142 mmol/L (ref 135–145)

## 2018-09-25 LAB — APTT: aPTT: 46 seconds — ABNORMAL HIGH (ref 24–36)

## 2018-09-25 LAB — HEPARIN LEVEL (UNFRACTIONATED)
HEPARIN UNFRACTIONATED: 0.34 [IU]/mL (ref 0.30–0.70)
HEPARIN UNFRACTIONATED: 0.23 [IU]/mL — AB (ref 0.30–0.70)

## 2018-09-25 MED ORDER — ~~LOC~~ CARDIAC SURGERY, PATIENT & FAMILY EDUCATION
Freq: Once | Status: AC
  Administered 2018-09-25: 1
  Filled 2018-09-25: qty 1

## 2018-09-25 MED ORDER — POTASSIUM CHLORIDE CRYS ER 20 MEQ PO TBCR
40.0000 meq | EXTENDED_RELEASE_TABLET | Freq: Once | ORAL | Status: AC
  Administered 2018-09-25: 40 meq via ORAL
  Filled 2018-09-25: qty 2

## 2018-09-25 MED ORDER — INSULIN ASPART 100 UNIT/ML ~~LOC~~ SOLN
0.0000 [IU] | Freq: Three times a day (TID) | SUBCUTANEOUS | Status: DC
  Administered 2018-09-25: 17:00:00 3 [IU] via SUBCUTANEOUS
  Administered 2018-09-25: 11:00:00 5 [IU] via SUBCUTANEOUS
  Administered 2018-09-26: 07:00:00 3 [IU] via SUBCUTANEOUS
  Administered 2018-09-26 (×2): 5 [IU] via SUBCUTANEOUS
  Administered 2018-09-27: 06:00:00 3 [IU] via SUBCUTANEOUS

## 2018-09-25 MED ORDER — MOVING RIGHT ALONG BOOK
Freq: Once | Status: AC
  Administered 2018-09-25: 1
  Filled 2018-09-25: qty 1

## 2018-09-25 MED ORDER — AMLODIPINE BESYLATE 5 MG PO TABS
5.0000 mg | ORAL_TABLET | Freq: Every day | ORAL | Status: DC
  Administered 2018-09-25 – 2018-09-26 (×2): 5 mg via ORAL
  Filled 2018-09-25 (×2): qty 1

## 2018-09-25 NOTE — Progress Notes (Signed)
.  MYA

## 2018-09-25 NOTE — Progress Notes (Addendum)
Advanced Heart Failure Rounding Note  PCP-Cardiologist: Sinclair Grooms, MD   Subjective:    Feeling good today. Remains asymptomatic. Cr and K stable. On milrinone 0.25 mcg/kg/min to optimize pulmonary pressures for surgery. SBP improved to 130s with addition of her home medications.   Plan for CABG/AVR +/- Maze tentatively 09/27/18  [facility] 09/24/18  Ost RCA to Prox RCA lesion is 75% stenosed.  Ost Cx to Prox Cx lesion is 100% stenosed.  Dist LM lesion is 80% stenosed.  Ost 1st Diag lesion is 75% stenosed.  Ost Ramus lesion is 75% stenosed.  Mild to moderate aortic stenosis (Mean gradient 20 mm Hg) Prox LAD lesion is 50% stenosed. Hemodynamics (mmHg) RA mean 8 RV 76/1 PA 76/25 (45) PCWP 16 AO 146/55 LV 166/12 PVR 4.9 WU Cardiac Output (Fick) 5.86 Cardiac Index (Fick) 2.99  Objective:   Weight Range: 92.1 kg Body mass index is 35.97 kg/m.   Vital Signs:   Temp:  [97.6 F (36.4 C)-99.1 F (37.3 C)] 98.6 F (37 C) (12/18 0741) Pulse Rate:  [59-73] 65 (12/18 0741) Resp:  [15-28] 18 (12/18 0741) BP: (132-204)/(38-67) 145/38 (12/18 0741) SpO2:  [97 %-100 %] 100 % (12/18 0741) Weight:  [92.1 kg] 92.1 kg (12/18 0439) Last BM Date: 09/22/18  Weight change: Filed Weights   09/24/18 0548 09/25/18 0439  Weight: 93.9 kg 92.1 kg    Intake/Output:   Intake/Output Summary (Last 24 hours) at 09/25/2018 0931 Last data filed at 09/25/2018 0902 Gross per 24 hour  Intake 1378.28 ml  Output 1700 ml  Net -321.72 ml      Physical Exam    General:  Well appearing. No resp difficulty HEENT: Normal Neck: Supple. JVP 6-7 cm. Carotids 2+ bilat; no bruits. No lymphadenopathy or thyromegaly appreciated. Cor: PMI nondisplaced. Regular rate & rhythm. No rubs, gallops or murmurs. Lungs: Clear Abdomen: Soft, nontender, nondistended. No hepatosplenomegaly. No bruits or masses. Good bowel sounds. Extremities: No cyanosis, clubbing, rash, or edema. Neuro: Alert &  orientedx3, cranial nerves grossly intact. moves all 4 extremities w/o difficulty. Affect pleasant   Telemetry   NSR 60-70s, personally reviewed.   EKG    09/24/18 NSR 68 bpm, personally reviewed.  Labs    CBC Recent Labs    09/25/18 0010  WBC 8.3  HGB 11.2*  HCT 35.3*  MCV 90.5  PLT 637*   Basic Metabolic Panel Recent Labs    09/25/18 0010  NA 142  K 3.5  CL 111  CO2 22  GLUCOSE 155*  BUN 14  CREATININE 0.82  CALCIUM 8.8*   Liver Function Tests No results for input(s): AST, ALT, ALKPHOS, BILITOT, PROT, ALBUMIN in the last 72 hours. No results for input(s): LIPASE, AMYLASE in the last 72 hours. Cardiac Enzymes No results for input(s): CKTOTAL, CKMB, CKMBINDEX, TROPONINI in the last 72 hours.  BNP: BNP (last 3 results) No results for input(s): BNP in the last 8760 hours.  ProBNP (last 3 results) No results for input(s): PROBNP in the last 8760 hours.   D-Dimer No results for input(s): DDIMER in the last 72 hours. Hemoglobin A1C No results for input(s): HGBA1C in the last 72 hours. Fasting Lipid Panel No results for input(s): CHOL, HDL, LDLCALC, TRIG, CHOLHDL, LDLDIRECT in the last 72 hours. Thyroid Function Tests No results for input(s): TSH, T4TOTAL, T3FREE, THYROIDAB in the last 72 hours.  Invalid input(s): FREET3  Other results:   Imaging    Vas US Doppler Pre Cabg  Result  Date: 09/25/2018 PREOPERATIVE VASCULAR EVALUATION  Indications:      Pre CABG evaluation. Limitations:      Focal calcification casts shadowing Comparison Study: No prior study available Performing Technologist: Landry Mellow, [place] RDMS RVT  Examination Guidelines: A complete evaluation includes B-mode imaging, spectral Doppler, color Doppler, and power Doppler as needed of all accessible portions of each vessel. Bilateral testing is considered an integral part of a complete examination. Limited examinations for reoccurring indications may be performed as noted.  Right Carotid  Findings: +----------+--------+--------+--------+------------------+--------+           PSV cm/sEDV cm/sStenosisDescribe          Comments +----------+--------+--------+--------+------------------+--------+ CCA Prox  73      14                                         +----------+--------+--------+--------+------------------+--------+ CCA Distal74      11                                         +----------+--------+--------+--------+------------------+--------+ ICA Prox  73      17      1-39%   focal and calcific         +----------+--------+--------+--------+------------------+--------+ ICA Mid   77      17                                         +----------+--------+--------+--------+------------------+--------+ ICA Distal89      19                                         +----------+--------+--------+--------+------------------+--------+ ECA       109     10                                         +----------+--------+--------+--------+------------------+--------+ Portions of this table do not appear on this page. +----------+--------+-------+--------+------------+           PSV cm/sEDV cmsDescribeArm Pressure +----------+--------+-------+--------+------------+ Subclavian63                     202          +----------+--------+-------+--------+------------+ +---------+--------+--+--------+--+---------+ VertebralPSV cm/s65EDV cm/s14Antegrade +---------+--------+--+--------+--+---------+ Left Carotid Findings: +----------+--------+--------+--------+------------------+--------+           PSV cm/sEDV cm/sStenosisDescribe          Comments +----------+--------+--------+--------+------------------+--------+ CCA Prox  58      13                                         +----------+--------+--------+--------+------------------+--------+ CCA Distal48      11                                          +----------+--------+--------+--------+------------------+--------+ ICA Prox  55      12  1-39%   focal and calcific         +----------+--------+--------+--------+------------------+--------+ ICA Mid   66      15                                         +----------+--------+--------+--------+------------------+--------+ ICA Distal113     25                                         +----------+--------+--------+--------+------------------+--------+ ECA       106     11                                         +----------+--------+--------+--------+------------------+--------+ +----------+--------+--------+--------+------------+ SubclavianPSV cm/sEDV cm/sDescribeArm Pressure +----------+--------+--------+--------+------------+           71                      199          +----------+--------+--------+--------+------------+ +---------+--------+--+--------+--+---------+ VertebralPSV cm/s52EDV cm/s13Antegrade +---------+--------+--+--------+--+---------+  ABI Findings: +--------+------------------+-----+---------+--------+ Right   Rt Pressure (mmHg)IndexWaveform Comment  +--------+------------------+-----+---------+--------+ [id number]                    triphasic         +--------+------------------+-----+---------+--------+ PTA     234               1.16 biphasic          +--------+------------------+-----+---------+--------+ DP      204               1.01 triphasic         +--------+------------------+-----+---------+--------+ +--------+------------------+-----+---------+-------+ Left    Lt Pressure (mmHg)IndexWaveform Comment +--------+------------------+-----+---------+-------+ [id number]                    triphasic        +--------+------------------+-----+---------+-------+ PTA     230               1.14 triphasic        +--------+------------------+-----+---------+-------+ DP      158               0.78 biphasic          +--------+------------------+-----+---------+-------+ +-------+---------------+----------------+ ABI/TBIToday's ABI/TBIPrevious ABI/TBI +-------+---------------+----------------+ Right  1.16                            +-------+---------------+----------------+ Left   1.14                            +-------+---------------+----------------+  Right Doppler Findings: +--------+--------+-----+---------+--------+ Site    PressureIndexDoppler  Comments +--------+--------+-----+---------+--------+ [id number]          triphasic         +--------+--------+-----+---------+--------+ Forearm              biphasic          +--------+--------+-----+---------+--------+ Radial               biphasic          +--------+--------+-----+---------+--------+  Left Doppler Findings: +--------+--------+-----+---------+--------+ Site    PressureIndexDoppler  Comments +--------+--------+-----+---------+--------+ [id number]  triphasic         +--------+--------+-----+---------+--------+ Radial               biphasic          +--------+--------+-----+---------+--------+ Ulnar                biphasic          +--------+--------+-----+---------+--------+  Summary: Right Carotid: Velocities in the right ICA are consistent with a 1-39% stenosis. Left Carotid: Velocities in the left ICA are consistent with a 1-39% stenosis. Vertebrals: Bilateral vertebral arteries demonstrate antegrade flow. Right ABI: Resting right ankle-brachial index is within normal range. No evidence of significant right lower extremity arterial disease. Left ABI: Resting left ankle-brachial index is within normal range. No evidence of significant left lower extremity arterial disease. Right Upper Extremity: Doppler waveforms remain within normal limits with right radial compression. Doppler waveforms decrease >50% with right ulnar compression. Left Upper Extremity: Doppler waveforms remain within  normal limits with left radial compression. Doppler waveforms decrease 50% with left ulnar compression.  Electronically signed by Deitra Mayo MD on 09/25/2018 at 6:03:49 AM.    Final       Medications:     Scheduled Medications: . amiodarone  200 mg Oral Daily  . aspirin  81 mg Oral Daily  . buPROPion  300 mg Oral Daily  . diltiazem  180 mg Oral Daily  . furosemide  20 mg Oral Daily  . glimepiride  4 mg Oral Q breakfast  . loratadine  10 mg Oral Daily  . losartan  50 mg Oral Daily  . metoprolol succinate  25 mg Oral BID  . potassium chloride  40 mEq Oral Once  . rosuvastatin  20 mg Oral Daily  . sodium chloride flush  3 mL Intravenous Q12H  . terazosin  5 mg Oral QHS     Infusions: . sodium chloride    . heparin 950 Units/hr (09/25/18 0837)  . milrinone 0.125 mcg/kg/min (09/25/18 0902)     PRN Medications:  sodium chloride, acetaminophen, hydrALAZINE, ondansetron (ZOFRAN) IV, sodium chloride flush    Patient Profile   Doris Jefferson is a 77 y.o. female with Chronic diastolic CHF, HTN, CAD, PAF, h/o OSA on CPAP, DM 2, HLD, moderate AS, and obesity.   Asked to see by [facility] for optimization prior to CABG.  Assessment/Plan   1. Severe 3v CAD - As cath above. Severe with 75% RCA, 100% Cx, 80% LM and LAD 50% stenosed - TCTS following for CABG/AVR +/- Maze tentatively 09/27/18.  - Continue statin.  2. Chronic diastolic CHF - Echo 83/6/62 LVEF 55-60%, Grade 2 DD, Mild/Mod AS, Mod MR, mild LAE, RV mild dilated, Mild TR, PA peak pressure 79 mm Hg.  - Volume status stable.  - Continue lasix 20 mg daily - Continue losartan 50 mg daily. Consider increase, but on discussion with Dr. Haroldine Laws may want to preserve as much renal function as possible leading up to surgery.  If BP > 150, consider increase.  - Continue Toprol XL 25 mg BID  3. Moderate Pulmonary HTN - By cath as above with PA pressure PA 76/25 mm/Hg (Mean 45)  - Likely WHO Group III in setting of OSA  +/- component of Group II with diastolic CHF. Suspect unlikely to benefit from pulmonary vasodilators.  - Would recommend continued CPAP titration if needed, and weight loss.  - Have added milrinone in attempt to lower pressures and optimize prior to CABG/AVR +/- Maze this week.  4. PAF - Remains in NSR.  - Will need to watch for post-op Afib. Follow on tele.  - Continue diltiazem 180 mg daily. - Continue amiodarone 200 mg daily - Holding eliquis with plans for surgery.  5. HTN - Meds as above.  - Continue terazosin 5 mg qhs.   6. OSA on CPAP - Continue CPAP.   7. DM 2 - Sliding scale while in house.   8. Obesity - Body mass index is 35.97 kg/m.  - Encouraged weight loss post surgery.   Medication concerns reviewed with patient and pharmacy team. Barriers identified: None.   Length of Stay: 1  Doris Jefferson  09/25/2018, 9:31 AM  Advanced Heart Failure Team Pager [phone] (M-F; 7a - 4p)  Please contact [facility] for night-coverage after hours (4p -7a ) and weekends on [email or web address]   Patient seen with PA, agree with the above note.   She feels good today, no complaints.  Remains on milrinone 0.125.   On exam, no JVD.  No edema.  3/6 systolic crescendo-decrescendo murmur RUSB with clear S2.    Severe 3VD with moderate aortic stenosis and history of paroxysmal atrial fibrillation.  - Plan CABG + bioprosthetic AVR + Maze on Friday.  - Continue heparin gtt for now while off Eliquis.   Severe pulmonary hypertension noted on RHC pre-op.  Suspect group 2 + 3 (diastolic CHF, OHS/OSA) PH, started on IV milrinone 0.125.  Probably not a good candidate for long-term use of selective pulmonary vasodilators as unlikely group 1 PH.  She had a negative V/Q scan not long ago.   She is not volume overloaded, does not need Lasix.   [name] 09/25/2018 2:15 PM

## 2018-09-25 NOTE — Progress Notes (Signed)
[place] for heparin (Apixaban on hold) Indication: chest pain/ACS, awaiting CABG/AVR  Allergies  Allergen Reactions  . Minocin [Minocycline Hcl] Swelling and Other (See Comments)    THROAT SWELLING  . Benazepril Cough  . Hydrochlorothiazide Itching  . Tape Rash    Patient Measurements: Height: 5\' 3"  (160 cm) Weight: 207 lb (93.9 kg) IBW/kg (Calculated) : 52.4 Heparin Dosing Weight: 74 kg  Vital Signs: Temp: 98.3 F (36.8 C) (12/17 1917) Temp Source: Oral (12/17 1917) BP: 137/39 (12/17 1917) Pulse Rate: 71 (12/17 2000)  Labs: Recent Labs    09/24/18 2349 09/25/18 0010  HGB  --  11.2*  HCT  --  35.3*  PLT  --  131*  APTT 46*  --   HEPARINUNFRC 0.23*  --   CREATININE  --  0.82    Estimated Creatinine Clearance: 62.6 mL/min (by C-G formula based on SCr of 0.82 mg/dL).   Medical History: Past Medical History:  Diagnosis Date  . Depression   . GERD (gastroesophageal reflux disease)   . Heart murmur   . HTN (hypertension)   . Hyperlipidemia   . Iron deficiency anemia   . Obesity (BMI 30-39.9) 03/04/2014  . OSA on CPAP    severe with AHI 31/hr  . PAF (paroxysmal atrial fibrillation) ([place])   . Pneumonia    "twice; years ago" (09/24/2018)  . Skin cancer    "left neck; burned off"  . Type II diabetes mellitus (H[facility])     Medications:  Scheduled:  . amiodarone  200 mg Oral Daily  . aspirin  81 mg Oral Daily  . buPROPion  300 mg Oral Daily  . diltiazem  180 mg Oral Daily  . furosemide  20 mg Oral Daily  . glimepiride  4 mg Oral Q breakfast  . loratadine  10 mg Oral Daily  . losartan  50 mg Oral Daily  . metoprolol succinate  25 mg Oral BID  . rosuvastatin  20 mg Oral Daily  . sodium chloride flush  3 mL Intravenous Q12H  . terazosin  5 mg Oral QHS    Assessment: 30 yof on apixaban PTA for Afib (last dose on 12/14), found to have severe 3 vessel CAD on cath - awaiting CTVS consult.   Plan to start heparin 8 hours  post-sheath removal (documented on 12/17@0828 ). No s/sx of bleeding.  Will order baseline aPTT and heparin level - if correlate, given ~72 hrs since last dose, monitor heparin level.  12/18 AM update: heparin level is low this AM, aPTT and heparin level correlate, will use heparin level only to dose, tentative plans for CABG/AVR on Friday, no issues per RN  Goal of Therapy:  Heparin level 0.3-0.7 units/mL Monitor platelets by anticoagulation protocol: Yes   Plan:  Inc heparin to 950 units/hr Re-check heparin level at [place], PharmD, [place] Pharmacist Phone: [phone]

## 2018-09-25 NOTE — Research (Signed)
[place] study protocol reviewed with patient and family. Questions encouraged and answered. ICF and pamphlet left for review. Research will follow up.

## 2018-09-25 NOTE — Progress Notes (Signed)
[address]       [place] [address]             [phone]                 1 Day Post-Op Procedure(s) (LRB): RIGHT/LEFT HEART CATH AND CORONARY ANGIOGRAPHY (N/A)  LOS: 1 day   Subjective: Up to chair , feels well today    Objective: Vital signs in last 24 hours: Patient Vitals for the past 24 hrs:  BP Temp Temp src Pulse Resp SpO2 Weight  09/25/18 1559 (!) 167/51 - - 62 18 99 % -  09/25/18 1557 (!) 167/51 97.9 F (36.6 C) Oral 64 (!) 23 99 % -  09/25/18 1423 (!) 143/47 98.1 F (36.7 C) Oral 62 (!) 23 98 % -  09/25/18 1044 (!) 176/51 98.1 F (36.7 C) Oral 63 (!) 27 100 % -  09/25/18 0741 (!) 145/38 98.6 F (37 C) Oral 65 18 100 % -  09/25/18 0439 (!) 132/44 97.6 F (36.4 C) Oral 68 20 99 % 92.1 kg  09/24/18 2000 - - - 71 15 98 % -  09/24/18 1917 (!) 137/39 98.3 F (36.8 C) Oral 73 18 99 % -    Filed Weights   09/24/18 0548 09/25/18 0439  Weight: 93.9 kg 92.1 kg    Hemodynamic parameters for last 24 hours:    Intake/Output from previous day: 12/17 0701 - 12/18 0700 In: 872.1 [P.O.:720; I.V.:152.1] Out: 1700 [Urine:1700] Intake/Output this shift: Total I/O In: 798.1 [P.O.:720; I.V.:78.1] Out: 600 [Urine:600]  Scheduled Meds: . amiodarone  200 mg Oral Daily  . amLODipine  5 mg Oral Daily  . aspirin  81 mg Oral Daily  . buPROPion  300 mg Oral Daily  . diltiazem  180 mg Oral Daily  . furosemide  20 mg Oral Daily  . glimepiride  4 mg Oral Q breakfast  . insulin aspart  0-15 Units Subcutaneous TID WC  . loratadine  10 mg Oral Daily  . losartan  50 mg Oral Daily  . metoprolol succinate  25 mg Oral BID  . rosuvastatin  20 mg Oral Daily  . sodium chloride flush  3 mL Intravenous Q12H  . terazosin  5 mg Oral QHS   Continuous Infusions: . sodium chloride    . heparin 950 Units/hr (09/25/18 1300)  . milrinone 0.125 mcg/kg/min (09/25/18 1300)   PRN Meds:.sodium chloride, acetaminophen, hydrALAZINE, ondansetron (ZOFRAN) IV, sodium  chloride flush  General appearance: alert, cooperative and no distress Neurologic: intact Heart: systolic murmur: holosystolic 3/6, crescendo at 2nd left intercostal space Lungs: diminished breath sounds bibasilar Abdomen: soft, non-tender; bowel sounds normal; no masses,  no organomegaly Extremities: extremities normal, atraumatic, no cyanosis or edema and Homans sign is negative, no sign of DVT  Lab Results: CBC: Recent Labs    09/25/18 0010  WBC 8.3  HGB 11.2*  HCT 35.3*  PLT 131*   BMET:  Recent Labs    09/25/18 0010  NA 142  K 3.5  CL 111  CO2 22  GLUCOSE 155*  BUN 14  CREATININE 0.82  CALCIUM 8.8*    PT/INR: No results for input(s): LABPROT, INR in the last 72 hours.   Radiology Vas US Doppler Pre Cabg  Result Date: 09/25/2018 PREOPERATIVE VASCULAR EVALUATION  Indications:      Pre CABG evaluation. Limitations:      Focal calcification casts shadowing Comparison Study: No prior study available Performing Technologist: Landry Mellow,  [place] RDMS RVT  Examination Guidelines: A complete evaluation includes B-mode imaging, spectral Doppler, color Doppler, and power Doppler as needed of all accessible portions of each vessel. Bilateral testing is considered an integral part of a complete examination. Limited examinations for reoccurring indications may be performed as noted.  Right Carotid Findings: +----------+--------+--------+--------+------------------+--------+           PSV cm/sEDV cm/sStenosisDescribe          Comments +----------+--------+--------+--------+------------------+--------+ CCA Prox  73      14                                         +----------+--------+--------+--------+------------------+--------+ CCA Distal74      11                                         +----------+--------+--------+--------+------------------+--------+ ICA Prox  73      17      1-39%   focal and calcific          +----------+--------+--------+--------+------------------+--------+ ICA Mid   77      17                                         +----------+--------+--------+--------+------------------+--------+ ICA Distal89      19                                         +----------+--------+--------+--------+------------------+--------+ ECA       109     10                                         +----------+--------+--------+--------+------------------+--------+ Portions of this table do not appear on this page. +----------+--------+-------+--------+------------+           PSV cm/sEDV cmsDescribeArm Pressure +----------+--------+-------+--------+------------+ Subclavian63                     202          +----------+--------+-------+--------+------------+ +---------+--------+--+--------+--+---------+ VertebralPSV cm/s65EDV cm/s14Antegrade +---------+--------+--+--------+--+---------+ Left Carotid Findings: +----------+--------+--------+--------+------------------+--------+           PSV cm/sEDV cm/sStenosisDescribe          Comments +----------+--------+--------+--------+------------------+--------+ CCA Prox  58      13                                         +----------+--------+--------+--------+------------------+--------+ CCA Distal48      11                                         +----------+--------+--------+--------+------------------+--------+ ICA Prox  55      12      1-39%   focal and calcific         +----------+--------+--------+--------+------------------+--------+ ICA Mid   66      15                                         +----------+--------+--------+--------+------------------+--------+  ICA Distal113     25                                         +----------+--------+--------+--------+------------------+--------+ ECA       106     11                                          +----------+--------+--------+--------+------------------+--------+ +----------+--------+--------+--------+------------+ SubclavianPSV cm/sEDV cm/sDescribeArm Pressure +----------+--------+--------+--------+------------+           71                      199          +----------+--------+--------+--------+------------+ +---------+--------+--+--------+--+---------+ VertebralPSV cm/s52EDV cm/s13Antegrade +---------+--------+--+--------+--+---------+  ABI Findings: +--------+------------------+-----+---------+--------+ Right   Rt Pressure (mmHg)IndexWaveform Comment  +--------+------------------+-----+---------+--------+ [id number]                    triphasic         +--------+------------------+-----+---------+--------+ PTA     234               1.16 biphasic          +--------+------------------+-----+---------+--------+ DP      204               1.01 triphasic         +--------+------------------+-----+---------+--------+ +--------+------------------+-----+---------+-------+ Left    Lt Pressure (mmHg)IndexWaveform Comment +--------+------------------+-----+---------+-------+ [id number]                    triphasic        +--------+------------------+-----+---------+-------+ PTA     230               1.14 triphasic        +--------+------------------+-----+---------+-------+ DP      158               0.78 biphasic         +--------+------------------+-----+---------+-------+ +-------+---------------+----------------+ ABI/TBIToday's ABI/TBIPrevious ABI/TBI +-------+---------------+----------------+ Right  1.16                            +-------+---------------+----------------+ Left   1.14                            +-------+---------------+----------------+  Right Doppler Findings: +--------+--------+-----+---------+--------+ Site    PressureIndexDoppler  Comments +--------+--------+-----+---------+--------+ [id number]           triphasic         +--------+--------+-----+---------+--------+ Forearm              biphasic          +--------+--------+-----+---------+--------+ Radial               biphasic          +--------+--------+-----+---------+--------+  Left Doppler Findings: +--------+--------+-----+---------+--------+ Site    PressureIndexDoppler  Comments +--------+--------+-----+---------+--------+ [id number]          triphasic         +--------+--------+-----+---------+--------+ Radial               biphasic          +--------+--------+-----+---------+--------+ Ulnar                biphasic          +--------+--------+-----+---------+--------+  Summary: Right Carotid: Velocities in the right ICA are consistent with a 1-39% stenosis. Left Carotid: Velocities in the left ICA are consistent with a 1-39% stenosis. Vertebrals: Bilateral vertebral arteries demonstrate antegrade flow. Right ABI: Resting right ankle-brachial index is within normal range. No evidence of significant right lower extremity arterial disease. Left ABI: Resting left ankle-brachial index is within normal range. No evidence of significant left lower extremity arterial disease. Right Upper Extremity: Doppler waveforms remain within normal limits with right radial compression. Doppler waveforms decrease >50% with right ulnar compression. Left Upper Extremity: Doppler waveforms remain within normal limits with left radial compression. Doppler waveforms decrease 50% with left ulnar compression.  Electronically signed by Deitra Mayo MD on 09/25/2018 at 6:03:49 AM.    Final      Assessment/Plan: S/P Procedure(s) (LRB): RIGHT/LEFT HEART CATH AND CORONARY ANGIOGRAPHY (N/A)  Plan, CABG, AVR with tissue valve , Atrial Clip, poss MAZE - reviewed with patient and her husband. She is willing to proceed. Tentative Friday. Has not had chest xray since July when she presented to ER with chest pain radiating to arm and sob .  Will check pre op    Grace Isaac MD 09/25/2018 4:58 PM
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
## 2018-09-25 NOTE — Progress Notes (Signed)
[phone] Discussed with pt the importance of IS and walking after surgery. Gave IS and pt able to get to 1500 ml correctly. Discussed staying in the tube and sternal precautions and gave handout. Wrote down how to view pre op video. Gave care guide and OHS booklet. Pt stated her husband would be with her after discharge to assist in care. 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 Will follow up after surgery. Graylon Good RN BSN 09/25/2018 9:22 AM

## 2018-09-26 ENCOUNTER — Inpatient Hospital Stay (HOSPITAL_COMMUNITY): Payer: Medicare Other

## 2018-09-26 LAB — CBC
HCT: 33.8 % — ABNORMAL LOW (ref 36.0–46.0)
Hemoglobin: 10.7 g/dL — ABNORMAL LOW (ref 12.0–15.0)
MCH: 29 pg (ref 26.0–34.0)
MCHC: 31.7 g/dL (ref 30.0–36.0)
MCV: 91.6 fL (ref 80.0–100.0)
NRBC: 0 % (ref 0.0–0.2)
Platelets: 134 10*3/uL — ABNORMAL LOW (ref 150–400)
RBC: 3.69 MIL/uL — AB (ref 3.87–5.11)
RDW: 14 % (ref 11.5–15.5)
WBC: 6.7 10*3/uL (ref 4.0–10.5)

## 2018-09-26 LAB — COMPREHENSIVE METABOLIC PANEL
ALT: 34 U/L (ref 0–44)
AST: 39 U/L (ref 15–41)
Albumin: 3.4 g/dL — ABNORMAL LOW (ref 3.5–5.0)
Alkaline Phosphatase: 56 U/L (ref 38–126)
Anion gap: 12 (ref 5–15)
BUN: 14 mg/dL (ref 8–23)
CO2: 21 mmol/L — ABNORMAL LOW (ref 22–32)
Calcium: 8.7 mg/dL — ABNORMAL LOW (ref 8.9–10.3)
Chloride: 110 mmol/L (ref 98–111)
Creatinine, Ser: 0.92 mg/dL (ref 0.44–1.00)
GFR calc Af Amer: >60 mL/min (ref >60)
GFR calc non Af Amer: >60 mL/min (ref >60)
Glucose, Bld: 152 mg/dL — ABNORMAL HIGH (ref 70–99)
Potassium: 3.6 mmol/L (ref 3.5–5.1)
Sodium: 143 mmol/L (ref 135–145)
Total Bilirubin: 0.8 mg/dL (ref 0.3–1.2)
Total Protein: 5.7 g/dL — ABNORMAL LOW (ref 6.5–8.1)

## 2018-09-26 LAB — GLUCOSE, CAPILLARY
Glucose-Capillary: 170 mg/dL — ABNORMAL HIGH (ref 70–99)
Glucose-Capillary: 201 mg/dL — ABNORMAL HIGH (ref 70–99)
Glucose-Capillary: 202 mg/dL — ABNORMAL HIGH (ref 70–99)
Glucose-Capillary: 146 mg/dL — ABNORMAL HIGH (ref 70–99)

## 2018-09-26 LAB — APTT: aPTT: 55 seconds — ABNORMAL HIGH (ref 24–36)

## 2018-09-26 LAB — SURGICAL PCR SCREEN
MRSA, PCR: NEGATIVE
Staphylococcus aureus: NEGATIVE

## 2018-09-26 LAB — PROTIME-INR
INR: 1.21
Prothrombin Time: 15.2 seconds (ref 11.4–15.2)

## 2018-09-26 LAB — HEPARIN LEVEL (UNFRACTIONATED)
HEPARIN UNFRACTIONATED: 0.26 [IU]/mL — AB (ref 0.30–0.70)
Heparin Unfractionated: 0.22 IU/mL — ABNORMAL LOW (ref 0.30–0.70)

## 2018-09-26 LAB — ABO/RH: ABO/RH(D): A POS

## 2018-09-26 MED ORDER — DEXMEDETOMIDINE HCL IN NACL 400 MCG/100ML IV SOLN
0.1000 ug/kg/h | INTRAVENOUS | Status: AC
  Administered 2018-09-27: .7 ug/kg/h via INTRAVENOUS
  Filled 2018-09-26: qty 100

## 2018-09-26 MED ORDER — TRANEXAMIC ACID 1000 MG/10ML IV SOLN
1.5000 mg/kg/h | INTRAVENOUS | Status: AC
  Administered 2018-09-27: 1.5 mg/kg/h via INTRAVENOUS
  Filled 2018-09-26: qty 25

## 2018-09-26 MED ORDER — DOPAMINE-DEXTROSE 3.2-5 MG/ML-% IV SOLN
0.0000 ug/kg/min | INTRAVENOUS | Status: DC
  Filled 2018-09-26: qty 250

## 2018-09-26 MED ORDER — EPINEPHRINE PF 1 MG/ML IJ SOLN
0.0000 ug/min | INTRAVENOUS | Status: DC
  Filled 2018-09-26: qty 4

## 2018-09-26 MED ORDER — PLASMA-LYTE 148 IV SOLN
INTRAVENOUS | Status: AC
  Administered 2018-09-27: 500 mL
  Filled 2018-09-26: qty 2.5

## 2018-09-26 MED ORDER — TRANEXAMIC ACID (OHS) PUMP PRIME SOLUTION
2.0000 mg/kg | INTRAVENOUS | Status: DC
  Filled 2018-09-26: qty 1.84

## 2018-09-26 MED ORDER — BISACODYL 5 MG PO TBEC: 5.0000 mg | DELAYED_RELEASE_TABLET | Freq: Once | ORAL | Status: DC

## 2018-09-26 MED ORDER — CHLORHEXIDINE GLUCONATE CLOTH 2 % EX PADS
6.0000 | MEDICATED_PAD | Freq: Once | CUTANEOUS | Status: AC
  Administered 2018-09-26: 22:00:00 6 via TOPICAL

## 2018-09-26 MED ORDER — SODIUM CHLORIDE 0.9 % IV SOLN
1.5000 g | INTRAVENOUS | Status: AC
  Administered 2018-09-27: 1.5 g via INTRAVENOUS
  Administered 2018-09-27: .75 g via INTRAVENOUS
  Filled 2018-09-26: qty 1.5

## 2018-09-26 MED ORDER — CHLORHEXIDINE GLUCONATE 4 % EX LIQD
CUTANEOUS | Status: AC
  Administered 2018-09-27: 4
  Filled 2018-09-26: qty 120

## 2018-09-26 MED ORDER — CHLORHEXIDINE GLUCONATE 0.12 % MT SOLN
15.0000 mL | Freq: Once | OROMUCOSAL | Status: AC
  Administered 2018-09-27: 06:00:00 15 mL via OROMUCOSAL
  Filled 2018-09-26: qty 15

## 2018-09-26 MED ORDER — TRANEXAMIC ACID (OHS) BOLUS VIA INFUSION
15.0000 mg/kg | INTRAVENOUS | Status: AC
  Administered 2018-09-27: 1381.5 mg via INTRAVENOUS
  Filled 2018-09-26: qty 1382

## 2018-09-26 MED ORDER — POTASSIUM CHLORIDE 2 MEQ/ML IV SOLN
80.0000 meq | INTRAVENOUS | Status: DC
  Filled 2018-09-26: qty 40

## 2018-09-26 MED ORDER — POTASSIUM CHLORIDE CRYS ER 20 MEQ PO TBCR
40.0000 meq | EXTENDED_RELEASE_TABLET | Freq: Once | ORAL | Status: AC
  Administered 2018-09-26: 40 meq via ORAL
  Filled 2018-09-26: qty 2

## 2018-09-26 MED ORDER — NOREPINEPHRINE BITARTRATE 1 MG/ML IV SOLN
0.0000 ug/min | INTRAVENOUS | Status: DC
  Filled 2018-09-26: qty 4

## 2018-09-26 MED ORDER — METOPROLOL TARTRATE 12.5 MG HALF TABLET
12.5000 mg | ORAL_TABLET | Freq: Once | ORAL | Status: AC
  Administered 2018-09-27: 06:00:00 12.5 mg via ORAL
  Filled 2018-09-26: qty 1

## 2018-09-26 MED ORDER — INSULIN REGULAR(HUMAN) IN NACL 100-0.9 UT/100ML-% IV SOLN
INTRAVENOUS | Status: AC
  Administered 2018-09-27: 2.6 [IU]/h via INTRAVENOUS
  Filled 2018-09-26: qty 100

## 2018-09-26 MED ORDER — TEMAZEPAM 15 MG PO CAPS
15.0000 mg | ORAL_CAPSULE | Freq: Once | ORAL | Status: DC | PRN
  Filled 2018-09-26 (×2): qty 1

## 2018-09-26 MED ORDER — CHLORHEXIDINE GLUCONATE 4 % EX LIQD
CUTANEOUS | Status: DC
  Administered 2018-09-27: 4 via TOPICAL

## 2018-09-26 MED ORDER — MILRINONE LACTATE IN DEXTROSE 20-5 MG/100ML-% IV SOLN
0.3000 ug/kg/min | INTRAVENOUS | Status: DC
  Filled 2018-09-26: qty 100

## 2018-09-26 MED ORDER — NITROGLYCERIN IN D5W 200-5 MCG/ML-% IV SOLN
2.0000 ug/min | INTRAVENOUS | Status: DC
  Filled 2018-09-26: qty 250

## 2018-09-26 MED ORDER — SODIUM CHLORIDE 0.9 % IV SOLN
INTRAVENOUS | Status: DC
  Filled 2018-09-26: qty 30

## 2018-09-26 MED ORDER — VANCOMYCIN HCL 10 G IV SOLR
1500.0000 mg | INTRAVENOUS | Status: AC
  Administered 2018-09-27: 1500 mg via INTRAVENOUS
  Filled 2018-09-26: qty 1500

## 2018-09-26 MED ORDER — SODIUM CHLORIDE 0.9 % IV SOLN
750.0000 mg | INTRAVENOUS | Status: DC
  Filled 2018-09-26: qty 750

## 2018-09-26 MED ORDER — MAGNESIUM SULFATE 50 % IJ SOLN
40.0000 meq | INTRAMUSCULAR | Status: DC
  Filled 2018-09-26: qty 9.85

## 2018-09-26 MED ORDER — PHENYLEPHRINE HCL-NACL 20-0.9 MG/250ML-% IV SOLN
30.0000 ug/min | INTRAVENOUS | Status: DC
  Filled 2018-09-26: qty 250
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
## 2018-09-26 NOTE — Progress Notes (Signed)
[address]       [place] [address]             [phone]                 2 Days Post-Op Procedure(s) (LRB): RIGHT/LEFT HEART CATH AND CORONARY ANGIOGRAPHY (N/A)  LOS: 2 days   Subjective: Patient  alert neurologically intact, feels well today no complaints   Objective: Vital signs in last 24 hours: Patient Vitals for the past 24 hrs:  BP Temp Temp src Pulse Resp SpO2  09/26/18 1602 (!) 169/56 98.2 F (36.8 C) Oral 65 (!) 25 99 %  09/26/18 1210 (!) 154/61 98 F (36.7 C) Oral 67 20 98 %  09/26/18 0708 (!) 125/46 98.1 F (36.7 C) Oral 65 18 96 %  09/26/18 0649 (!) 143/34 98.2 F (36.8 C) Oral 62 (!) 27 97 %  09/25/18 2007 (!) 155/53 98.1 F (36.7 C) Oral 65 20 99 %  09/25/18 1758 - 98.5 F (36.9 C) Oral - - -    Filed Weights   09/24/18 0548 09/25/18 0439  Weight: 93.9 kg 92.1 kg    Hemodynamic parameters for last 24 hours:    Intake/Output from previous day: 12/18 0701 - 12/19 0700 In: 1726.5 [P.O.:1440; I.V.:286.5] Out: 1000 [Urine:1000] Intake/Output this shift: No intake/output data recorded.  Scheduled Meds: . amiodarone  200 mg Oral Daily  . amLODipine  5 mg Oral Daily  . aspirin  81 mg Oral Daily  . buPROPion  300 mg Oral Daily  . diltiazem  180 mg Oral Daily  . furosemide  20 mg Oral Daily  . glimepiride  4 mg Oral Q breakfast  . [START ON 09/27/2018] heparin-papaverine-plasmalyte irrigation   Irrigation To OR  . insulin aspart  0-15 Units Subcutaneous TID WC  . [START ON 09/27/2018] insulin   Intravenous To OR  . loratadine  10 mg Oral Daily  . losartan  50 mg Oral Daily  . [START ON 09/27/2018] magnesium sulfate  40 mEq Other To OR  . metoprolol succinate  25 mg Oral BID  . [START ON 09/27/2018] phenylephrine  30-200 mcg/min Intravenous To OR  . [START ON 09/27/2018] potassium chloride  80 mEq Other To OR  . rosuvastatin  20 mg Oral Daily  . sodium chloride flush  3 mL Intravenous Q12H  . terazosin  5 mg Oral QHS    . [START ON 09/27/2018] tranexamic acid  15 mg/kg Intravenous To OR  . [START ON 09/27/2018] tranexamic acid  2 mg/kg Intracatheter To OR   Continuous Infusions: . sodium chloride    . [START ON 09/27/2018] cefUROXime (ZINACEF)  IV    . [START ON 09/27/2018] cefUROXime (ZINACEF)  IV    . [START ON 09/27/2018] dexmedetomidine    . [START ON 09/27/2018] DOPamine    . [START ON 09/27/2018] epinephrine    . [START ON 09/27/2018] heparin 30,000 units/NS 1000 mL solution for CELLSAVER    . heparin 1,050 Units/hr (09/26/18 0717)  . milrinone 0.125 mcg/kg/min (09/26/18 1559)  . [START ON 09/27/2018] milrinone    . [START ON 09/27/2018] nitroGLYCERIN    . [START ON 09/27/2018] norepinephrine (LEVOPHED) Adult infusion    . [START ON 09/27/2018] tranexamic acid (CYKLOKAPRON) infusion ([facility])    . [START ON 09/27/2018] vancomycin     PRN Meds:.sodium chloride, acetaminophen, hydrALAZINE, ondansetron (ZOFRAN) IV, sodium chloride flush  General appearance: alert and cooperative Neurologic: intact Heart: regular rate and  rhythm, S1, S2 normal, no murmur, click, rub or gallop Lungs: diminished breath sounds bibasilar Abdomen: soft, non-tender; bowel sounds normal; no masses,  no organomegaly Extremities: extremities normal, atraumatic, no cyanosis or edema and Homans sign is negative, no sign of DVT  Lab Results: CBC: Recent Labs    09/25/18 0010 09/26/18 0411  WBC 8.3 6.7  HGB 11.2* 10.7*  HCT 35.3* 33.8*  PLT 131* 134*   BMET:  Recent Labs    09/25/18 0010 09/26/18 0411  NA 142 143  K 3.5 3.6  CL 111 110  CO2 22 21*  GLUCOSE 155* 152*  BUN 14 14  CREATININE 0.82 0.92  CALCIUM 8.8* 8.7*    PT/INR:  Recent Labs    09/26/18 0411  LABPROT 15.2  INR 1.21     Radiology Dg Chest 2 View  Result Date: 09/26/2018 CLINICAL DATA:  Preop evaluation for upcoming heart surgery EXAM: CHEST - 2 VIEW COMPARISON:  04/20/2018 FINDINGS: Cardiac shadow is within normal limits. The lungs  are well aerated bilaterally. No focal infiltrate or effusion is seen. Degenerative changes of the thoracic spine are noted. Postsurgical changes in the cervical spine are seen. IMPRESSION: No acute abnormality noted. Electronically Signed   By: Inez Catalina M.D.   On: 09/26/2018 07:15   Vas US Doppler Pre Cabg  Result Date: 09/25/2018 PREOPERATIVE VASCULAR EVALUATION  Indications:      Pre CABG evaluation. Limitations:      Focal calcification casts shadowing Comparison Study: No prior study available Performing Technologist: Landry Mellow, Hongying RDMS RVT  Examination Guidelines: A complete evaluation includes B-mode imaging, spectral Doppler, color Doppler, and power Doppler as needed of all accessible portions of each vessel. Bilateral testing is considered an integral part of a complete examination. Limited examinations for reoccurring indications may be performed as noted.  Right Carotid Findings: +----------+--------+--------+--------+------------------+--------+           PSV cm/sEDV cm/sStenosisDescribe          Comments +----------+--------+--------+--------+------------------+--------+ CCA Prox  73      14                                         +----------+--------+--------+--------+------------------+--------+ CCA Distal74      11                                         +----------+--------+--------+--------+------------------+--------+ ICA Prox  73      17      1-39%   focal and calcific         +----------+--------+--------+--------+------------------+--------+ ICA Mid   77      17                                         +----------+--------+--------+--------+------------------+--------+ ICA Distal89      19                                         +----------+--------+--------+--------+------------------+--------+ ECA       109     10                                         +----------+--------+--------+--------+------------------+--------+  Portions of  this table do not appear on this page. +----------+--------+-------+--------+------------+           PSV cm/sEDV cmsDescribeArm Pressure +----------+--------+-------+--------+------------+ Subclavian63                     202          +----------+--------+-------+--------+------------+ +---------+--------+--+--------+--+---------+ VertebralPSV cm/s65EDV cm/s14Antegrade +---------+--------+--+--------+--+---------+ Left Carotid Findings: +----------+--------+--------+--------+------------------+--------+           PSV cm/sEDV cm/sStenosisDescribe          Comments +----------+--------+--------+--------+------------------+--------+ CCA Prox  58      13                                         +----------+--------+--------+--------+------------------+--------+ CCA Distal48      11                                         +----------+--------+--------+--------+------------------+--------+ ICA Prox  55      12      1-39%   focal and calcific         +----------+--------+--------+--------+------------------+--------+ ICA Mid   66      15                                         +----------+--------+--------+--------+------------------+--------+ ICA Distal113     25                                         +----------+--------+--------+--------+------------------+--------+ ECA       106     11                                         +----------+--------+--------+--------+------------------+--------+ +----------+--------+--------+--------+------------+ SubclavianPSV cm/sEDV cm/sDescribeArm Pressure +----------+--------+--------+--------+------------+           71                      199          +----------+--------+--------+--------+------------+ +---------+--------+--+--------+--+---------+ VertebralPSV cm/s52EDV cm/s13Antegrade +---------+--------+--+--------+--+---------+  ABI Findings:  +--------+------------------+-----+---------+--------+ Right   Rt Pressure (mmHg)IndexWaveform Comment  +--------+------------------+-----+---------+--------+ Brachial202                    triphasic         +--------+------------------+-----+---------+--------+ PTA     234               1.16 biphasic          +--------+------------------+-----+---------+--------+ DP      204               1.01 triphasic         +--------+------------------+-----+---------+--------+ +--------+------------------+-----+---------+-------+ Left    Lt Pressure (mmHg)IndexWaveform Comment +--------+------------------+-----+---------+-------+ [id number]                    triphasic        +--------+------------------+-----+---------+-------+ PTA     230               1.14 triphasic        +--------+------------------+-----+---------+-------+  DP      158               0.78 biphasic         +--------+------------------+-----+---------+-------+ +-------+---------------+----------------+ ABI/TBIToday's ABI/TBIPrevious ABI/TBI +-------+---------------+----------------+ Right  1.16                            +-------+---------------+----------------+ Left   1.14                            +-------+---------------+----------------+  Right Doppler Findings: +--------+--------+-----+---------+--------+ Site    PressureIndexDoppler  Comments +--------+--------+-----+---------+--------+ [id number]          triphasic         +--------+--------+-----+---------+--------+ Forearm              biphasic          +--------+--------+-----+---------+--------+ Radial               biphasic          +--------+--------+-----+---------+--------+  Left Doppler Findings: +--------+--------+-----+---------+--------+ Site    PressureIndexDoppler  Comments +--------+--------+-----+---------+--------+ [id number]          triphasic          +--------+--------+-----+---------+--------+ Radial               biphasic          +--------+--------+-----+---------+--------+ Ulnar                biphasic          +--------+--------+-----+---------+--------+  Summary: Right Carotid: Velocities in the right ICA are consistent with a 1-39% stenosis. Left Carotid: Velocities in the left ICA are consistent with a 1-39% stenosis. Vertebrals: Bilateral vertebral arteries demonstrate antegrade flow. Right ABI: Resting right ankle-brachial index is within normal range. No evidence of significant right lower extremity arterial disease. Left ABI: Resting left ankle-brachial index is within normal range. No evidence of significant left lower extremity arterial disease. Right Upper Extremity: Doppler waveforms remain within normal limits with right radial compression. Doppler waveforms decrease >50% with right ulnar compression. Left Upper Extremity: Doppler waveforms remain within normal limits with left radial compression. Doppler waveforms decrease 50% with left ulnar compression.  Electronically signed by Deitra Mayo MD on 09/25/2018 at 6:03:49 AM.    Final      Assessment/Plan: S/P Procedure(s) (LRB): RIGHT/LEFT HEART CATH AND CORONARY ANGIOGRAPHY (N/A)  Plan to proceed with coronary artery bypass grafting for ostial right and left main coronary obstruction, and aortic valve replacement with tissue valve for aortic stenosis at least moderate.  We will place an atrial clip and consider maze with pulmonary vein isolation.  Risks and options in the disease process of been discussed with the patient and her husband in detail.  Expectations from surgery have been discussed.  The patient is willing to proceed  The goals risks and alternatives of the planned surgical procedure coronary artery bypass, aortic valve replacement, atrial clip, possible maze have been discussed with the patient in detail. The risks of the procedure including death,  infection, stroke, myocardial infarction, bleeding, blood transfusion have all been discussed specifically.  I have quoted Harl Bowie Mcmanus a 5 % of perioperative mortality and a complication rate as high as 50 %. The patient's questions have been answered.[facility] Winget is willing  to proceed with the planned procedure.  Grace Isaac MD 09/26/2018 5:24 PM

## 2018-09-26 NOTE — Progress Notes (Addendum)
Advanced Heart Failure Rounding Note  PCP-Cardiologist: Sinclair Grooms, MD   Subjective:    On milrinone 0.125 mcg/kg/min to reduce pulmonary pressures. Plan for CABG/AVR +/- Maze tentatively 09/27/18  Feeling good today. Remains asymptomatic. Good appetite. No questions about tomorrows procedure at this time.   [facility] 09/24/18  Ost RCA to Prox RCA lesion is 75% stenosed.  Ost Cx to Prox Cx lesion is 100% stenosed.  Dist LM lesion is 80% stenosed.  Ost 1st Diag lesion is 75% stenosed.  Ost Ramus lesion is 75% stenosed.  Mild to moderate aortic stenosis (Mean gradient 20 mm Hg) Prox LAD lesion is 50% stenosed. Hemodynamics (mmHg) RA mean 8 RV 76/1 PA 76/25 (45) PCWP 16 AO 146/55 LV 166/12 PVR 4.9 WU Cardiac Output (Fick) 5.86 Cardiac Index (Fick) 2.99  Objective:   Weight Range: 92.1 kg Body mass index is 35.97 kg/m.   Vital Signs:   Temp:  [97.9 F (36.6 C)-98.5 F (36.9 C)] 98.1 F (36.7 C) (12/19 0708) Pulse Rate:  [62-65] 65 (12/19 0708) Resp:  [18-27] 18 (12/19 0708) BP: (125-176)/(34-53) 125/46 (12/19 0708) SpO2:  [96 %-100 %] 96 % (12/19 0708) Last BM Date: 09/25/18  Weight change: Filed Weights   09/24/18 0548 09/25/18 0439  Weight: 93.9 kg 92.1 kg    Intake/Output:   Intake/Output Summary (Last 24 hours) at 09/26/2018 0942 Last data filed at 09/26/2018 0700 Gross per 24 hour  Intake 1220.26 ml  Output 1000 ml  Net 220.26 ml      Physical Exam    General: Well appearing. No resp difficulty. HEENT: Normal Neck: Supple. JVP 6-7 cm. Carotids 2+ bilat; no bruits. No thyromegaly or nodule noted. Cor: PMI nondisplaced. RRR, 2/6 crescendo Decrescendo with radiation into carotids.  Lungs: CTAB, normal effort. Abdomen: Soft, non-tender, non-distended, no HSM. No bruits or masses. +BS  Extremities: No cyanosis, clubbing, or rash. R and LLE no edema.  Neuro: Alert & orientedx3, cranial nerves grossly intact. moves all 4 extremities  w/o difficulty. Affect pleasant   Telemetry   NSR 60-70s, personally reviewed.   EKG    No new tracings.    Labs    CBC Recent Labs    09/25/18 0010 09/26/18 0411  WBC 8.3 6.7  HGB 11.2* 10.7*  HCT 35.3* 33.8*  MCV 90.5 91.6  PLT 131* 916*   Basic Metabolic Panel Recent Labs    09/25/18 0010 09/26/18 0411  NA 142 143  K 3.5 3.6  CL 111 110  CO2 22 21*  GLUCOSE 155* 152*  BUN 14 14  CREATININE 0.82 0.92  CALCIUM 8.8* 8.7*   Liver Function Tests Recent Labs    09/26/18 0411  AST 39  ALT 34  ALKPHOS 56  BILITOT 0.8  PROT 5.7*  ALBUMIN 3.4*   No results for input(s): LIPASE, AMYLASE in the last 72 hours. Cardiac Enzymes No results for input(s): CKTOTAL, CKMB, CKMBINDEX, TROPONINI in the last 72 hours.  BNP: BNP (last 3 results) No results for input(s): BNP in the last 8760 hours.  ProBNP (last 3 results) No results for input(s): PROBNP in the last 8760 hours.   D-Dimer No results for input(s): DDIMER in the last 72 hours. Hemoglobin A1C No results for input(s): HGBA1C in the last 72 hours. Fasting Lipid Panel No results for input(s): CHOL, HDL, LDLCALC, TRIG, CHOLHDL, LDLDIRECT in the last 72 hours. Thyroid Function Tests No results for input(s): TSH, T4TOTAL, T3FREE, THYROIDAB in the last 72 hours.  Invalid input(s): FREET3  Other results:   Imaging    Dg Chest 2 View  Result Date: 09/26/2018 CLINICAL DATA:  Preop evaluation for upcoming heart surgery EXAM: CHEST - 2 VIEW COMPARISON:  04/20/2018 FINDINGS: Cardiac shadow is within normal limits. The lungs are well aerated bilaterally. No focal infiltrate or effusion is seen. Degenerative changes of the thoracic spine are noted. Postsurgical changes in the cervical spine are seen. IMPRESSION: No acute abnormality noted. Electronically Signed   By: Inez Catalina M.D.   On: 09/26/2018 07:15     Medications:     Scheduled Medications: . amiodarone  200 mg Oral Daily  . amLODipine  5 mg  Oral Daily  . aspirin  81 mg Oral Daily  . buPROPion  300 mg Oral Daily  . diltiazem  180 mg Oral Daily  . furosemide  20 mg Oral Daily  . glimepiride  4 mg Oral Q breakfast  . insulin aspart  0-15 Units Subcutaneous TID WC  . loratadine  10 mg Oral Daily  . losartan  50 mg Oral Daily  . metoprolol succinate  25 mg Oral BID  . rosuvastatin  20 mg Oral Daily  . sodium chloride flush  3 mL Intravenous Q12H  . terazosin  5 mg Oral QHS    Infusions: . sodium chloride    . heparin 1,050 Units/hr (09/26/18 0717)  . milrinone 0.125 mcg/kg/min (09/26/18 0600)    PRN Medications: sodium chloride, acetaminophen, hydrALAZINE, ondansetron (ZOFRAN) IV, sodium chloride flush    Patient Profile   Doris Jefferson is a 77 y.o. female with Chronic diastolic CHF, HTN, CAD, PAF, h/o OSA on CPAP, DM 2, HLD, moderate AS, and obesity.   Asked to see by [facility] for optimization prior to CABG.  Assessment/Plan   1. Severe 3v CAD - As cath above. Severe with 75% RCA, 100% Cx, 80% LM and LAD 50% stenosed - TCTS following for CABG/AVR +/- Maze tentatively 09/27/18.  - Continue statin.   2. Chronic diastolic CHF - Echo 11/15/35 LVEF 55-60%, Grade 2 DD, Mild/Mod AS, Mod MR, mild LAE, RV mild dilated, Mild TR, PA peak pressure 79 mm Hg.  - Volume status stable.  - Continue lasix 20 mg daily - Continue losartan 50 mg daily.  - Continue Toprol XL 25 mg BID  3. 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 Suspect unlikely to benefit from pulmonary vasodilators.  - Would recommend continued CPAP titration if needed, and weight loss.  - Have added milrinone in attempt to lower pressures and optimize prior to CABG/AVR +/- Maze tentatively tomorrow.   4. PAF - Remains in NSR by Tele and EKG this admit.  - Will need to watch for post-op Afib. Follow on tele.  - Continue  diltiazem 180 mg daily. - Continue amiodarone 200 mg daily - Holding eliquis with plans for surgery. Covering with heparin.   5. HTN - Meds as above.  - BP has improved on home regimen. 120s this am. Can consider amlodipine as needed. Would not increase ARB prior to CABG.  - Continue terazosin 5 mg qhs.   6. OSA on CPAP - Continue CPAP.    7. DM 2 - Sliding scale while in house.   8. Obesity - Body mass index is 35.97 kg/m.  - Encouraged weight loss post surgery.  Doing well this am. Pt is likely as optimized as she can be for surgery tomorrow.   Medication concerns reviewed with patient and pharmacy team. Barriers identified: None  Length of Stay: 2  Shirley Friar, PA-C  09/26/2018, 9:42 AM  Advanced Heart Failure Team Pager [phone] (M-F; 7a - 4p)  Please contact [facility] for night-coverage after hours (4p -7a ) and weekends on [email or web address]  Patient seen and examined with the above-signed Advanced Practice Provider and/or Housestaff. I personally reviewed laboratory data, imaging studies and relevant notes. I independently examined the patient and formulated the important aspects of the plan. I have edited the note to reflect any of my changes or salient points. I have personally discussed the plan with the patient and/or family.  Doing well. Tolerating milrinone without difficulty. No CP or HF symptoms. Echos reviewed with Dr. [name]. She has moderate AS. Would recommend AVR at time of CABG. Surgery tomorrow.   Glori Bickers, MD  5:20 PM

## 2018-09-26 NOTE — Progress Notes (Signed)
[place] for heparin (Apixaban on hold) Indication: chest pain/ACS, awaiting CABG/AVR  Allergies  Allergen Reactions  . Minocin [Minocycline Hcl] Swelling and Other (See Comments)    THROAT SWELLING  . Benazepril Cough  . Hydrochlorothiazide Itching  . Tape Rash    Patient Measurements: Height: 5\' 3"  (160 cm) Weight: 203 lb 0.7 oz (92.1 kg) IBW/kg (Calculated) : 52.4 Heparin Dosing Weight: 74 kg  Vital Signs: Temp: 98 F (36.7 C) (12/19 1210) Temp Source: Oral (12/19 1210) BP: 154/61 (12/19 1210) Pulse Rate: 67 (12/19 1210)  Labs: Recent Labs    09/24/18 2349 09/25/18 0010 09/25/18 1042 09/26/18 0411 09/26/18 1348  HGB  --  11.2*  --  10.7*  --   HCT  --  35.3*  --  33.8*  --   PLT  --  131*  --  134*  --   APTT 46*  --   --  55*  --   LABPROT  --   --   --  15.2  --   INR  --   --   --  1.21  --   HEPARINUNFRC 0.23*  --  0.34 0.22* 0.26*  CREATININE  --  0.82  --  0.92  --     Estimated Creatinine Clearance: 55.2 mL/min (by C-G formula based on SCr of 0.92 mg/dL).   Medical History: Past Medical History:  Diagnosis Date  . Depression   . GERD (gastroesophageal reflux disease)   . Heart murmur   . HTN (hypertension)   . Hyperlipidemia   . Iron deficiency anemia   . Obesity (BMI 30-39.9) 03/04/2014  . OSA on CPAP    severe with AHI 31/hr  . PAF (paroxysmal atrial fibrillation) ([place])   . Pneumonia    "twice; years ago" (09/24/2018)  . Skin cancer    "left neck; burned off"  . Type II diabetes mellitus ([facility])     Medications:  Scheduled:  . amiodarone  200 mg Oral Daily  . amLODipine  5 mg Oral Daily  . aspirin  81 mg Oral Daily  . buPROPion  300 mg Oral Daily  . diltiazem  180 mg Oral Daily  . furosemide  20 mg Oral Daily  . glimepiride  4 mg Oral Q breakfast  . [START ON 09/27/2018] heparin-papaverine-plasmalyte irrigation   Irrigation To OR  . insulin aspart  0-15 Units Subcutaneous TID WC  . [START ON  09/27/2018] insulin   Intravenous To OR  . loratadine  10 mg Oral Daily  . losartan  50 mg Oral Daily  . [START ON 09/27/2018] magnesium sulfate  40 mEq Other To OR  . metoprolol succinate  25 mg Oral BID  . [START ON 09/27/2018] phenylephrine  30-200 mcg/min Intravenous To OR  . [START ON 09/27/2018] potassium chloride  80 mEq Other To OR  . rosuvastatin  20 mg Oral Daily  . sodium chloride flush  3 mL Intravenous Q12H  . terazosin  5 mg Oral QHS  . [START ON 09/27/2018] tranexamic acid  15 mg/kg Intravenous To OR  . [START ON 09/27/2018] tranexamic acid  2 mg/kg Intracatheter To OR    Assessment: 22 yof on apixaban PTA for Afib (last dose on 12/14), found to have severe 3 vessel CAD on cath. Pt started on IV heparin bridge in anticipation of CABG.   Heparin level slightly below goal, but level drawn ~2 hr early so suspect is will accumulate further. Will increase  slightly and recheck tomorrow.  Goal of Therapy:  Heparin level 0.3-0.7 units/mL Monitor platelets by anticoagulation protocol: Yes   Plan:  -Increase heparin to 1100 units/hr -Daily heparin level and CBC  Doris Jefferson, PharmD, BCPS Clinical Pharmacist [phone] Please check AMION for all [facility] numbers 09/26/2018

## 2018-09-27 ENCOUNTER — Encounter (HOSPITAL_COMMUNITY): Payer: Self-pay | Admitting: Registered Nurse

## 2018-09-27 ENCOUNTER — Inpatient Hospital Stay (HOSPITAL_COMMUNITY): Payer: Medicare Other

## 2018-09-27 ENCOUNTER — Inpatient Hospital Stay (HOSPITAL_COMMUNITY)
Admission: RE | Disposition: A | Discharge status: Home Health | Payer: Self-pay | Source: Home / Self Care | Attending: Interventional Cardiology

## 2018-09-27 ENCOUNTER — Inpatient Hospital Stay (HOSPITAL_COMMUNITY): Payer: Medicare Other | Admitting: Certified Registered Nurse Anesthetist

## 2018-09-27 DIAGNOSIS — Z952 Presence of prosthetic heart valve: Secondary | ICD-10-CM

## 2018-09-27 DIAGNOSIS — Z951 Presence of aortocoronary bypass graft: Secondary | ICD-10-CM

## 2018-09-27 HISTORY — PX: AORTIC VALVE REPLACEMENT: SHX41

## 2018-09-27 HISTORY — PX: TEE WITHOUT CARDIOVERSION: SHX5443

## 2018-09-27 HISTORY — PX: CORONARY ARTERY BYPASS GRAFT: SHX141

## 2018-09-27 HISTORY — PX: CLIPPING OF ATRIAL APPENDAGE: SHX5773

## 2018-09-27 LAB — APTT: aPTT: 31 seconds (ref 24–36)

## 2018-09-27 LAB — POCT I-STAT, CHEM 8
BUN: 12 mg/dL (ref 8–23)
BUN: 11 mg/dL (ref 8–23)
BUN: 10 mg/dL (ref 8–23)
BUN: 11 mg/dL (ref 8–23)
BUN: 11 mg/dL (ref 8–23)
BUN: 12 mg/dL (ref 8–23)
BUN: 11 mg/dL (ref 8–23)
BUN: 12 mg/dL (ref 8–23)
CALCIUM ION: 1.24 mmol/L (ref 1.15–1.40)
CALCIUM ION: 1.14 mmol/L — AB (ref 1.15–1.40)
CHLORIDE: 110 mmol/L (ref 98–111)
CHLORIDE: 107 mmol/L (ref 98–111)
CHLORIDE: 108 mmol/L (ref 98–111)
CREATININE: 0.5 mg/dL (ref 0.44–1.00)
Calcium, Ion: 1.22 mmol/L (ref 1.15–1.40)
Calcium, Ion: 0.98 mmol/L — ABNORMAL LOW (ref 1.15–1.40)
Calcium, Ion: 1.12 mmol/L — ABNORMAL LOW (ref 1.15–1.40)
Calcium, Ion: 1.15 mmol/L (ref 1.15–1.40)
Calcium, Ion: 1.17 mmol/L (ref 1.15–1.40)
Calcium, Ion: 1.21 mmol/L (ref 1.15–1.40)
Chloride: 106 mmol/L (ref 98–111)
Chloride: 105 mmol/L (ref 98–111)
Chloride: 106 mmol/L (ref 98–111)
Chloride: 104 mmol/L (ref 98–111)
Chloride: 111 mmol/L (ref 98–111)
Creatinine, Ser: 0.6 mg/dL (ref 0.44–1.00)
Creatinine, Ser: 0.6 mg/dL (ref 0.44–1.00)
Creatinine, Ser: 0.5 mg/dL (ref 0.44–1.00)
Creatinine, Ser: 0.5 mg/dL (ref 0.44–1.00)
Creatinine, Ser: 0.5 mg/dL (ref 0.44–1.00)
Creatinine, Ser: 0.5 mg/dL (ref 0.44–1.00)
Creatinine, Ser: 0.6 mg/dL (ref 0.44–1.00)
Glucose, Bld: 188 mg/dL — ABNORMAL HIGH (ref 70–99)
Glucose, Bld: 126 mg/dL — ABNORMAL HIGH (ref 70–99)
Glucose, Bld: 100 mg/dL — ABNORMAL HIGH (ref 70–99)
Glucose, Bld: 134 mg/dL — ABNORMAL HIGH (ref 70–99)
Glucose, Bld: 162 mg/dL — ABNORMAL HIGH (ref 70–99)
Glucose, Bld: 177 mg/dL — ABNORMAL HIGH (ref 70–99)
Glucose, Bld: 171 mg/dL — ABNORMAL HIGH (ref 70–99)
Glucose, Bld: 160 mg/dL — ABNORMAL HIGH (ref 70–99)
HCT: 31 % — ABNORMAL LOW (ref 36.0–46.0)
HCT: 26 % — ABNORMAL LOW (ref 36.0–46.0)
HCT: 24 % — ABNORMAL LOW (ref 36.0–46.0)
HCT: 24 % — ABNORMAL LOW (ref 36.0–46.0)
HCT: 29 % — ABNORMAL LOW (ref 36.0–46.0)
HCT: 27 % — ABNORMAL LOW (ref 36.0–46.0)
HCT: 33 % — ABNORMAL LOW (ref 36.0–46.0)
HEMATOCRIT: 22 % — AB (ref 36.0–46.0)
HEMOGLOBIN: 10.5 g/dL — AB (ref 12.0–15.0)
Hemoglobin: 8.8 g/dL — ABNORMAL LOW (ref 12.0–15.0)
Hemoglobin: 7.5 g/dL — ABNORMAL LOW (ref 12.0–15.0)
Hemoglobin: 8.2 g/dL — ABNORMAL LOW (ref 12.0–15.0)
Hemoglobin: 8.2 g/dL — ABNORMAL LOW (ref 12.0–15.0)
Hemoglobin: 9.9 g/dL — ABNORMAL LOW (ref 12.0–15.0)
Hemoglobin: 9.2 g/dL — ABNORMAL LOW (ref 12.0–15.0)
Hemoglobin: 11.2 g/dL — ABNORMAL LOW (ref 12.0–15.0)
POTASSIUM: 3.9 mmol/L (ref 3.5–5.1)
POTASSIUM: 3.9 mmol/L (ref 3.5–5.1)
Potassium: 4.1 mmol/L (ref 3.5–5.1)
Potassium: 4 mmol/L (ref 3.5–5.1)
Potassium: 4.1 mmol/L (ref 3.5–5.1)
Potassium: 4.1 mmol/L (ref 3.5–5.1)
Potassium: 5.1 mmol/L (ref 3.5–5.1)
Potassium: 4.1 mmol/L (ref 3.5–5.1)
SODIUM: 143 mmol/L (ref 135–145)
Sodium: 144 mmol/L (ref 135–145)
Sodium: 143 mmol/L (ref 135–145)
Sodium: 140 mmol/L (ref 135–145)
Sodium: 141 mmol/L (ref 135–145)
Sodium: 143 mmol/L (ref 135–145)
Sodium: 144 mmol/L (ref 135–145)
Sodium: 143 mmol/L (ref 135–145)
TCO2: 28 mmol/L (ref 22–32)
TCO2: 25 mmol/L (ref 22–32)
TCO2: 28 mmol/L (ref 22–32)
TCO2: 28 mmol/L (ref 22–32)
TCO2: 29 mmol/L (ref 22–32)
TCO2: 27 mmol/L (ref 22–32)
TCO2: 24 mmol/L (ref 22–32)
TCO2: 26 mmol/L (ref 22–32)

## 2018-09-27 LAB — POCT I-STAT 3, ART BLOOD GAS (G3+)
ACID-BASE DEFICIT: 2 mmol/L (ref 0.0–2.0)
Acid-Base Excess: 3 mmol/L — ABNORMAL HIGH (ref 0.0–2.0)
Acid-base deficit: 1 mmol/L (ref 0.0–2.0)
Acid-base deficit: 4 mmol/L — ABNORMAL HIGH (ref 0.0–2.0)
Acid-base deficit: 3 mmol/L — ABNORMAL HIGH (ref 0.0–2.0)
BICARBONATE: 26.5 mmol/L (ref 20.0–28.0)
Bicarbonate: 25.1 mmol/L (ref 20.0–28.0)
Bicarbonate: 23.3 mmol/L (ref 20.0–28.0)
Bicarbonate: 22.4 mmol/L (ref 20.0–28.0)
Bicarbonate: 23 mmol/L (ref 20.0–28.0)
O2 Saturation: 100 %
O2 Saturation: 100 %
O2 Saturation: 100 %
O2 Saturation: 99 %
O2 Saturation: 98 %
PH ART: 7.293 — AB (ref 7.350–7.450)
Patient temperature: 36.2
Patient temperature: 36.5
TCO2: 26 mmol/L (ref 22–32)
TCO2: 28 mmol/L (ref 22–32)
TCO2: 25 mmol/L (ref 22–32)
TCO2: 24 mmol/L (ref 22–32)
TCO2: 24 mmol/L (ref 22–32)
pCO2 arterial: 45.6 mmHg (ref 32.0–48.0)
pCO2 arterial: 33.4 mmHg (ref 32.0–48.0)
pCO2 arterial: 42.5 mmHg (ref 32.0–48.0)
pCO2 arterial: 45.9 mmHg (ref 32.0–48.0)
pCO2 arterial: 42.4 mmHg (ref 32.0–48.0)
pH, Arterial: 7.348 — ABNORMAL LOW (ref 7.350–7.450)
pH, Arterial: 7.508 — ABNORMAL HIGH (ref 7.350–7.450)
pH, Arterial: 7.347 — ABNORMAL LOW (ref 7.350–7.450)
pH, Arterial: 7.339 — ABNORMAL LOW (ref 7.350–7.450)
pO2, Arterial: 438 mmHg — ABNORMAL HIGH (ref 83.0–108.0)
pO2, Arterial: 370 mmHg — ABNORMAL HIGH (ref 83.0–108.0)
pO2, Arterial: 192 mmHg — ABNORMAL HIGH (ref 83.0–108.0)
pO2, Arterial: 182 mmHg — ABNORMAL HIGH (ref 83.0–108.0)
pO2, Arterial: 114 mmHg — ABNORMAL HIGH (ref 83.0–108.0)

## 2018-09-27 LAB — CBC
HCT: 36.4 % (ref 36.0–46.0)
HCT: 37.2 % (ref 36.0–46.0)
HCT: 36.9 % (ref 36.0–46.0)
Hemoglobin: 11.5 g/dL — ABNORMAL LOW (ref 12.0–15.0)
Hemoglobin: 11.7 g/dL — ABNORMAL LOW (ref 12.0–15.0)
Hemoglobin: 11.7 g/dL — ABNORMAL LOW (ref 12.0–15.0)
MCH: 29.2 pg (ref 26.0–34.0)
MCH: 28.5 pg (ref 26.0–34.0)
MCH: 28.4 pg (ref 26.0–34.0)
MCHC: 31.6 g/dL (ref 30.0–36.0)
MCHC: 31.5 g/dL (ref 30.0–36.0)
MCHC: 31.7 g/dL (ref 30.0–36.0)
MCV: 92.4 fL (ref 80.0–100.0)
MCV: 90.5 fL (ref 80.0–100.0)
MCV: 89.6 fL (ref 80.0–100.0)
Platelets: 148 10*3/uL — ABNORMAL LOW (ref 150–400)
Platelets: UNDETERMINED 10*3/uL (ref 150–400)
Platelets: UNDETERMINED 10*3/uL (ref 150–400)
RBC: 3.94 MIL/uL (ref 3.87–5.11)
RBC: 4.11 MIL/uL (ref 3.87–5.11)
RBC: 4.12 MIL/uL (ref 3.87–5.11)
RDW: 14.1 % (ref 11.5–15.5)
RDW: 14.6 % (ref 11.5–15.5)
RDW: 15.1 % (ref 11.5–15.5)
WBC: 8.1 10*3/uL (ref 4.0–10.5)
WBC: 11.8 10*3/uL — ABNORMAL HIGH (ref 4.0–10.5)
WBC: 15.4 10*3/uL — ABNORMAL HIGH (ref 4.0–10.5)
nRBC: 0 % (ref 0.0–0.2)
nRBC: 0 % (ref 0.0–0.2)
nRBC: 0 % (ref 0.0–0.2)

## 2018-09-27 LAB — POCT I-STAT 4, (NA,K, GLUC, HGB,HCT)
Glucose, Bld: 107 mg/dL — ABNORMAL HIGH (ref 70–99)
HCT: 32 % — ABNORMAL LOW (ref 36.0–46.0)
Hemoglobin: 10.9 g/dL — ABNORMAL LOW (ref 12.0–15.0)
Potassium: 4 mmol/L (ref 3.5–5.1)
SODIUM: 145 mmol/L (ref 135–145)

## 2018-09-27 LAB — BASIC METABOLIC PANEL
Anion gap: 10 (ref 5–15)
BUN: 13 mg/dL (ref 8–23)
CO2: 22 mmol/L (ref 22–32)
CREATININE: 1.01 mg/dL — AB (ref 0.44–1.00)
Calcium: 9 mg/dL (ref 8.9–10.3)
Chloride: 110 mmol/L (ref 98–111)
GFR calc Af Amer: >60 mL/min (ref >60)
GFR calc non Af Amer: 54 mL/min — ABNORMAL LOW (ref >60)
Glucose, Bld: 196 mg/dL — ABNORMAL HIGH (ref 70–99)
Potassium: 4.2 mmol/L (ref 3.5–5.1)
Sodium: 142 mmol/L (ref 135–145)

## 2018-09-27 LAB — MAGNESIUM: Magnesium: 2.9 mg/dL — ABNORMAL HIGH (ref 1.7–2.4)

## 2018-09-27 LAB — PROTIME-INR
INR: 1.47
Prothrombin Time: 17.6 seconds — ABNORMAL HIGH (ref 11.4–15.2)

## 2018-09-27 LAB — GLUCOSE, CAPILLARY
Glucose-Capillary: 161 mg/dL — ABNORMAL HIGH (ref 70–99)
Glucose-Capillary: 86 mg/dL (ref 70–99)
Glucose-Capillary: 103 mg/dL — ABNORMAL HIGH (ref 70–99)
Glucose-Capillary: 118 mg/dL — ABNORMAL HIGH (ref 70–99)
Glucose-Capillary: 123 mg/dL — ABNORMAL HIGH (ref 70–99)
Glucose-Capillary: 113 mg/dL — ABNORMAL HIGH (ref 70–99)
Glucose-Capillary: 117 mg/dL — ABNORMAL HIGH (ref 70–99)

## 2018-09-27 LAB — PREPARE RBC (CROSSMATCH)

## 2018-09-27 LAB — CREATININE, SERUM
Creatinine, Ser: 0.72 mg/dL (ref 0.44–1.00)
GFR calc Af Amer: >60 mL/min (ref >60)
GFR calc non Af Amer: >60 mL/min (ref >60)

## 2018-09-27 LAB — HEMOGLOBIN AND HEMATOCRIT, BLOOD
HCT: 31.8 % — ABNORMAL LOW (ref 36.0–46.0)
Hemoglobin: 10 g/dL — ABNORMAL LOW (ref 12.0–15.0)

## 2018-09-27 LAB — PLATELET COUNT: Platelets: 126 10*3/uL — ABNORMAL LOW (ref 150–400)

## 2018-09-27 LAB — HEPARIN LEVEL (UNFRACTIONATED): Heparin Unfractionated: 0.44 IU/mL (ref 0.30–0.70)

## 2018-09-27 SURGERY — CORONARY ARTERY BYPASS GRAFTING (CABG)
Anesthesia: General | Site: Chest

## 2018-09-27 MED ORDER — FAMOTIDINE IN NACL 20-0.9 MG/50ML-% IV SOLN
20.0000 mg | Freq: Two times a day (BID) | INTRAVENOUS | Status: AC
  Administered 2018-09-27 (×2): 20 mg via INTRAVENOUS
  Filled 2018-09-27: qty 50

## 2018-09-27 MED ORDER — SODIUM CHLORIDE 0.45 % IV SOLN
INTRAVENOUS | Status: DC | PRN
  Administered 2018-09-27: 20 mL/h via INTRAVENOUS

## 2018-09-27 MED ORDER — PHENYLEPHRINE HCL 10 MG/ML IJ SOLN
INTRAMUSCULAR | Status: DC | PRN
  Administered 2018-09-27 (×10): 80 ug via INTRAVENOUS

## 2018-09-27 MED ORDER — ALBUMIN HUMAN 5 % IV SOLN
250.0000 mL | INTRAVENOUS | Status: AC | PRN
  Administered 2018-09-27: 12.5 g via INTRAVENOUS

## 2018-09-27 MED ORDER — SODIUM CHLORIDE 0.9% FLUSH: 10.0000 mL | INTRAVENOUS | Status: DC | PRN

## 2018-09-27 MED ORDER — ONDANSETRON HCL 4 MG/2ML IJ SOLN: 4.0000 mg | Freq: Four times a day (QID) | INTRAMUSCULAR | Status: DC | PRN

## 2018-09-27 MED ORDER — METOCLOPRAMIDE HCL 5 MG/ML IJ SOLN
10.0000 mg | Freq: Four times a day (QID) | INTRAMUSCULAR | Status: AC
  Administered 2018-09-27 – 2018-09-28 (×4): 10 mg via INTRAVENOUS
  Filled 2018-09-27 (×4): qty 2

## 2018-09-27 MED ORDER — SUCCINYLCHOLINE CHLORIDE 20 MG/ML IJ SOLN
INTRAMUSCULAR | Status: DC | PRN
  Administered 2018-09-27: 120 mg via INTRAVENOUS

## 2018-09-27 MED ORDER — PROTAMINE SULFATE 10 MG/ML IV SOLN
INTRAVENOUS | Status: DC | PRN
  Administered 2018-09-27 (×5): 50 mg via INTRAVENOUS
  Administered 2018-09-27: 5 mg via INTRAVENOUS
  Administered 2018-09-27: 25 mg via INTRAVENOUS
  Administered 2018-09-27: 50 mg via INTRAVENOUS

## 2018-09-27 MED ORDER — VECURONIUM BROMIDE 10 MG IV SOLR
INTRAVENOUS | Status: AC
  Filled 2018-09-27: qty 20

## 2018-09-27 MED ORDER — TRAMADOL HCL 50 MG PO TABS
50.0000 mg | ORAL_TABLET | ORAL | Status: DC | PRN
  Administered 2018-09-28 – 2018-09-30 (×3): 50 mg via ORAL
  Administered 2018-10-02: 100 mg via ORAL
  Filled 2018-09-27: qty 2
  Filled 2018-09-27 (×2): qty 1
  Filled 2018-09-27: qty 2

## 2018-09-27 MED ORDER — VECURONIUM BROMIDE 10 MG IV SOLR
INTRAVENOUS | Status: DC | PRN
  Administered 2018-09-27 (×2): 10 mg via INTRAVENOUS

## 2018-09-27 MED ORDER — GLYCOPYRROLATE PF 0.2 MG/ML IJ SOSY
PREFILLED_SYRINGE | INTRAMUSCULAR | Status: DC | PRN
  Administered 2018-09-27: .2 mg via INTRAVENOUS

## 2018-09-27 MED ORDER — OXYCODONE HCL 5 MG PO TABS
5.0000 mg | ORAL_TABLET | ORAL | Status: DC | PRN
  Administered 2018-09-28 – 2018-09-29 (×7): 10 mg via ORAL
  Administered 2018-09-29: 5 mg via ORAL
  Administered 2018-09-30 (×2): 10 mg via ORAL
  Administered 2018-09-30 – 2018-10-01 (×2): 5 mg via ORAL
  Filled 2018-09-27: qty 2
  Filled 2018-09-27: qty 1
  Filled 2018-09-27 (×2): qty 2
  Filled 2018-09-27: qty 1
  Filled 2018-09-27: qty 2
  Filled 2018-09-27: qty 1
  Filled 2018-09-27 (×5): qty 2

## 2018-09-27 MED ORDER — HEPARIN SODIUM (PORCINE) 1000 UNIT/ML IJ SOLN
INTRAMUSCULAR | Status: DC | PRN
  Administered 2018-09-27: 33000 [IU] via INTRAVENOUS

## 2018-09-27 MED ORDER — LIDOCAINE 2% (20 MG/ML) 5 ML SYRINGE
INTRAMUSCULAR | Status: DC | PRN
  Administered 2018-09-27: 100 mg via INTRAVENOUS

## 2018-09-27 MED ORDER — PHENYLEPHRINE HCL-NACL 20-0.9 MG/250ML-% IV SOLN
0.0000 ug/min | INTRAVENOUS | Status: DC
  Administered 2018-09-27: 50 ug/min via INTRAVENOUS
  Administered 2018-09-28: 25 ug/min via INTRAVENOUS
  Administered 2018-09-29: 35 ug/min via INTRAVENOUS
  Filled 2018-09-27 (×4): qty 250

## 2018-09-27 MED ORDER — ROCURONIUM BROMIDE 50 MG/5ML IV SOSY
PREFILLED_SYRINGE | INTRAVENOUS | Status: AC
  Filled 2018-09-27: qty 5

## 2018-09-27 MED ORDER — METOPROLOL TARTRATE 25 MG/10 ML ORAL SUSPENSION: 12.5000 mg | Freq: Two times a day (BID) | ORAL | Status: DC

## 2018-09-27 MED ORDER — LACTATED RINGERS IV SOLN
INTRAVENOUS | Status: DC | PRN
  Administered 2018-09-27: 08:00:00 via INTRAVENOUS

## 2018-09-27 MED ORDER — EPHEDRINE 5 MG/ML INJ
INTRAVENOUS | Status: AC
  Filled 2018-09-27: qty 10

## 2018-09-27 MED ORDER — ASPIRIN EC 325 MG PO TBEC: 325.0000 mg | DELAYED_RELEASE_TABLET | Freq: Every day | ORAL | Status: DC

## 2018-09-27 MED ORDER — SODIUM CHLORIDE (PF) 0.9 % IJ SOLN
OROMUCOSAL | Status: DC | PRN
  Administered 2018-09-27 (×3): 4 mL via TOPICAL

## 2018-09-27 MED ORDER — MORPHINE SULFATE (PF) 2 MG/ML IV SOLN
1.0000 mg | INTRAVENOUS | Status: DC | PRN
  Administered 2018-09-27 – 2018-09-28 (×2): 2 mg via INTRAVENOUS
  Administered 2018-09-28: 4 mg via INTRAVENOUS
  Administered 2018-09-28 – 2018-09-29 (×4): 2 mg via INTRAVENOUS
  Filled 2018-09-27 (×6): qty 1
  Filled 2018-09-27: qty 2

## 2018-09-27 MED ORDER — PHENYLEPHRINE 40 MCG/ML (10ML) SYRINGE FOR IV PUSH (FOR BLOOD PRESSURE SUPPORT)
PREFILLED_SYRINGE | INTRAVENOUS | Status: AC
  Filled 2018-09-27: qty 10

## 2018-09-27 MED ORDER — 0.9 % SODIUM CHLORIDE (POUR BTL) OPTIME
TOPICAL | Status: DC | PRN
  Administered 2018-09-27: 6000 mL

## 2018-09-27 MED ORDER — SODIUM BICARBONATE 8.4 % IV SOLN
50.0000 meq | Freq: Once | INTRAVENOUS | Status: AC
  Administered 2018-09-27: 50 meq via INTRAVENOUS

## 2018-09-27 MED ORDER — LACTATED RINGERS IV SOLN
INTRAVENOUS | Status: DC | PRN
  Administered 2018-09-27 (×2): via INTRAVENOUS

## 2018-09-27 MED ORDER — SODIUM CHLORIDE 0.9 % IV SOLN
INTRAVENOUS | Status: DC
  Administered 2018-09-30 – 2018-10-03 (×2): via INTRAVENOUS

## 2018-09-27 MED ORDER — SODIUM CHLORIDE 0.9 % IV SOLN
1.5000 g | Freq: Two times a day (BID) | INTRAVENOUS | Status: AC
  Administered 2018-09-27 – 2018-09-29 (×4): 1.5 g via INTRAVENOUS
  Filled 2018-09-27 (×4): qty 1.5

## 2018-09-27 MED ORDER — DEXMEDETOMIDINE HCL IN NACL 400 MCG/100ML IV SOLN
0.0000 ug/kg/h | INTRAVENOUS | Status: DC
  Administered 2018-09-27: 0.5 ug/kg/h via INTRAVENOUS
  Administered 2018-09-28: 0.7 ug/kg/h via INTRAVENOUS
  Filled 2018-09-27 (×2): qty 100

## 2018-09-27 MED ORDER — DOPAMINE-DEXTROSE 3.2-5 MG/ML-% IV SOLN: 3.0000 ug/kg/min | INTRAVENOUS | Status: DC

## 2018-09-27 MED ORDER — LACTATED RINGERS IV SOLN: INTRAVENOUS | Status: DC

## 2018-09-27 MED ORDER — SODIUM CHLORIDE 0.9 % IV SOLN: 250.0000 mL | INTRAVENOUS | Status: DC

## 2018-09-27 MED ORDER — ASPIRIN 81 MG PO CHEW: 324.0000 mg | CHEWABLE_TABLET | Freq: Every day | ORAL | Status: DC

## 2018-09-27 MED ORDER — ACETAMINOPHEN 650 MG RE SUPP
650.0000 mg | Freq: Once | RECTAL | Status: AC
  Administered 2018-09-27: 650 mg via RECTAL

## 2018-09-27 MED ORDER — INSULIN REGULAR BOLUS VIA INFUSION
0.0000 [IU] | Freq: Three times a day (TID) | INTRAVENOUS | Status: DC
  Filled 2018-09-27: qty 10

## 2018-09-27 MED ORDER — METOPROLOL TARTRATE 5 MG/5ML IV SOLN: 2.5000 mg | INTRAVENOUS | Status: DC | PRN

## 2018-09-27 MED ORDER — ORAL CARE MOUTH RINSE
15.0000 mL | OROMUCOSAL | Status: DC
  Administered 2018-09-27 – 2018-09-28 (×7): 15 mL via OROMUCOSAL

## 2018-09-27 MED ORDER — ARTIFICIAL TEARS OPHTHALMIC OINT
TOPICAL_OINTMENT | OPHTHALMIC | Status: DC | PRN
  Administered 2018-09-27: 1 via OPHTHALMIC

## 2018-09-27 MED ORDER — ACETAMINOPHEN 160 MG/5ML PO SOLN: 650.0000 mg | Freq: Once | ORAL | Status: AC

## 2018-09-27 MED ORDER — PROPOFOL 10 MG/ML IV BOLUS
INTRAVENOUS | Status: AC
  Filled 2018-09-27: qty 20

## 2018-09-27 MED ORDER — CHLORHEXIDINE GLUCONATE 0.12% ORAL RINSE (MEDLINE KIT)
15.0000 mL | Freq: Two times a day (BID) | OROMUCOSAL | Status: DC
  Administered 2018-09-27 – 2018-09-28 (×2): 15 mL via OROMUCOSAL

## 2018-09-27 MED ORDER — CHLORHEXIDINE GLUCONATE 0.12 % MT SOLN
15.0000 mL | OROMUCOSAL | Status: AC
  Administered 2018-09-27: 15 mL via OROMUCOSAL

## 2018-09-27 MED ORDER — SODIUM CHLORIDE (PF) 0.9 % IJ SOLN
INTRAMUSCULAR | Status: AC
  Filled 2018-09-27: qty 20

## 2018-09-27 MED ORDER — ARTIFICIAL TEARS OPHTHALMIC OINT
TOPICAL_OINTMENT | OPHTHALMIC | Status: AC
  Filled 2018-09-27: qty 3.5

## 2018-09-27 MED ORDER — POTASSIUM CHLORIDE 10 MEQ/50ML IV SOLN: 10.0000 meq | INTRAVENOUS | Status: AC

## 2018-09-27 MED ORDER — FENTANYL CITRATE (PF) 250 MCG/5ML IJ SOLN
INTRAMUSCULAR | Status: AC
  Filled 2018-09-27: qty 30

## 2018-09-27 MED ORDER — ALBUMIN HUMAN 5 % IV SOLN
INTRAVENOUS | Status: DC | PRN
  Administered 2018-09-27: 14:00:00 via INTRAVENOUS

## 2018-09-27 MED ORDER — HEMOSTATIC AGENTS (NO CHARGE) OPTIME
TOPICAL | Status: DC | PRN
  Administered 2018-09-27 (×3): 1 via TOPICAL

## 2018-09-27 MED ORDER — MIDAZOLAM HCL 5 MG/5ML IJ SOLN
INTRAMUSCULAR | Status: DC | PRN
  Administered 2018-09-27: 1 mg via INTRAVENOUS
  Administered 2018-09-27 (×2): 2 mg via INTRAVENOUS
  Administered 2018-09-27 (×2): 1 mg via INTRAVENOUS

## 2018-09-27 MED ORDER — ONDANSETRON HCL 4 MG/2ML IJ SOLN
INTRAMUSCULAR | Status: AC
  Filled 2018-09-27: qty 2

## 2018-09-27 MED ORDER — SODIUM CHLORIDE 0.9 % IV SOLN
INTRAVENOUS | Status: DC | PRN
  Administered 2018-09-27: 20 ug/min via INTRAVENOUS

## 2018-09-27 MED ORDER — MILRINONE LACTATE IN DEXTROSE 20-5 MG/100ML-% IV SOLN
0.1250 ug/kg/min | INTRAVENOUS | Status: DC
  Administered 2018-09-27: 0.3 ug/kg/min via INTRAVENOUS
  Administered 2018-09-28 – 2018-10-02 (×5): 0.125 ug/kg/min via INTRAVENOUS
  Administered 2018-10-03 (×2): 0.25 ug/kg/min via INTRAVENOUS
  Administered 2018-10-04: 0.125 ug/kg/min via INTRAVENOUS
  Filled 2018-09-27 (×8): qty 100

## 2018-09-27 MED ORDER — DOCUSATE SODIUM 100 MG PO CAPS
200.0000 mg | ORAL_CAPSULE | Freq: Every day | ORAL | Status: DC
  Administered 2018-09-28 – 2018-10-02 (×5): 200 mg via ORAL
  Filled 2018-09-27 (×5): qty 2

## 2018-09-27 MED ORDER — PANTOPRAZOLE SODIUM 40 MG PO TBEC
40.0000 mg | DELAYED_RELEASE_TABLET | Freq: Every day | ORAL | Status: DC
  Administered 2018-09-29 – 2018-10-09 (×11): 40 mg via ORAL
  Filled 2018-09-27 (×11): qty 1

## 2018-09-27 MED ORDER — ACETAMINOPHEN 160 MG/5ML PO SOLN
1000.0000 mg | Freq: Four times a day (QID) | ORAL | Status: AC
  Administered 2018-09-27 – 2018-09-28 (×2): 1000 mg
  Filled 2018-09-27 (×2): qty 40.6

## 2018-09-27 MED ORDER — MIDAZOLAM HCL (PF) 10 MG/2ML IJ SOLN
INTRAMUSCULAR | Status: AC
  Filled 2018-09-27: qty 2

## 2018-09-27 MED ORDER — SODIUM CHLORIDE 0.9% FLUSH
3.0000 mL | INTRAVENOUS | Status: DC | PRN
  Administered 2018-10-04: 3 mL via INTRAVENOUS
  Filled 2018-09-27: qty 3

## 2018-09-27 MED ORDER — LACTATED RINGERS IV SOLN
INTRAVENOUS | Status: DC
  Administered 2018-09-28: 02:00:00 via INTRAVENOUS

## 2018-09-27 MED ORDER — NITROGLYCERIN IN D5W 200-5 MCG/ML-% IV SOLN: 0.0000 ug/min | INTRAVENOUS | Status: DC

## 2018-09-27 MED ORDER — ONDANSETRON HCL 4 MG/2ML IJ SOLN
INTRAMUSCULAR | Status: DC | PRN
  Administered 2018-09-27: 4 mg via INTRAVENOUS

## 2018-09-27 MED ORDER — DOPAMINE-DEXTROSE 3.2-5 MG/ML-% IV SOLN
INTRAVENOUS | Status: DC | PRN
  Administered 2018-09-27: 3 ug/kg/min via INTRAVENOUS

## 2018-09-27 MED ORDER — VANCOMYCIN HCL IN DEXTROSE 1-5 GM/200ML-% IV SOLN
1000.0000 mg | Freq: Once | INTRAVENOUS | Status: AC
  Administered 2018-09-27: 1000 mg via INTRAVENOUS
  Filled 2018-09-27: qty 200

## 2018-09-27 MED ORDER — METOPROLOL TARTRATE 12.5 MG HALF TABLET
12.5000 mg | ORAL_TABLET | Freq: Two times a day (BID) | ORAL | Status: DC
  Administered 2018-09-29 – 2018-10-01 (×5): 12.5 mg via ORAL
  Filled 2018-09-27 (×6): qty 1

## 2018-09-27 MED ORDER — PROPOFOL 10 MG/ML IV BOLUS
INTRAVENOUS | Status: DC | PRN
  Administered 2018-09-27: 70 mg via INTRAVENOUS

## 2018-09-27 MED ORDER — MIDAZOLAM HCL 2 MG/2ML IJ SOLN
2.0000 mg | INTRAMUSCULAR | Status: DC | PRN
  Filled 2018-09-27: qty 2

## 2018-09-27 MED ORDER — ROCURONIUM BROMIDE 100 MG/10ML IV SOLN
INTRAVENOUS | Status: DC | PRN
  Administered 2018-09-27 (×3): 50 mg via INTRAVENOUS

## 2018-09-27 MED ORDER — SODIUM CHLORIDE 0.9% FLUSH
10.0000 mL | Freq: Two times a day (BID) | INTRAVENOUS | Status: DC
  Administered 2018-09-28 – 2018-09-30 (×2): 10 mL
  Administered 2018-10-01: 30 mL

## 2018-09-27 MED ORDER — SODIUM CHLORIDE 0.9% FLUSH
3.0000 mL | Freq: Two times a day (BID) | INTRAVENOUS | Status: DC
  Administered 2018-09-28 – 2018-10-08 (×15): 3 mL via INTRAVENOUS

## 2018-09-27 MED ORDER — LACTATED RINGERS IV SOLN: 500.0000 mL | Freq: Once | INTRAVENOUS | Status: DC | PRN

## 2018-09-27 MED ORDER — INSULIN REGULAR(HUMAN) IN NACL 100-0.9 UT/100ML-% IV SOLN: INTRAVENOUS | Status: DC

## 2018-09-27 MED ORDER — BISACODYL 10 MG RE SUPP: 10.0000 mg | Freq: Every day | RECTAL | Status: DC

## 2018-09-27 MED ORDER — MAGNESIUM SULFATE 4 GM/100ML IV SOLN
4.0000 g | Freq: Once | INTRAVENOUS | Status: AC
  Administered 2018-09-27: 4 g via INTRAVENOUS
  Filled 2018-09-27: qty 100

## 2018-09-27 MED ORDER — ACETAMINOPHEN 500 MG PO TABS
1000.0000 mg | ORAL_TABLET | Freq: Four times a day (QID) | ORAL | Status: AC
  Administered 2018-09-28 – 2018-10-02 (×15): 1000 mg via ORAL
  Filled 2018-09-27 (×17): qty 2

## 2018-09-27 MED ORDER — CHLORHEXIDINE GLUCONATE CLOTH 2 % EX PADS
6.0000 | MEDICATED_PAD | Freq: Every day | CUTANEOUS | Status: DC
  Administered 2018-09-27 – 2018-09-29 (×3): 6 via TOPICAL

## 2018-09-27 MED ORDER — FENTANYL CITRATE (PF) 250 MCG/5ML IJ SOLN
INTRAMUSCULAR | Status: DC | PRN
  Administered 2018-09-27: 150 ug via INTRAVENOUS
  Administered 2018-09-27 (×2): 50 ug via INTRAVENOUS
  Administered 2018-09-27: 100 ug via INTRAVENOUS
  Administered 2018-09-27 (×2): 150 ug via INTRAVENOUS
  Administered 2018-09-27: 100 ug via INTRAVENOUS
  Administered 2018-09-27: 25 ug via INTRAVENOUS
  Administered 2018-09-27: 100 ug via INTRAVENOUS
  Administered 2018-09-27: 150 ug via INTRAVENOUS
  Administered 2018-09-27: 25 ug via INTRAVENOUS
  Administered 2018-09-27: 250 ug via INTRAVENOUS
  Administered 2018-09-27: 200 ug via INTRAVENOUS

## 2018-09-27 MED ORDER — LIDOCAINE 2% (20 MG/ML) 5 ML SYRINGE
INTRAMUSCULAR | Status: AC
  Filled 2018-09-27: qty 5

## 2018-09-27 MED ORDER — EPHEDRINE SULFATE 50 MG/ML IJ SOLN
INTRAMUSCULAR | Status: DC | PRN
  Administered 2018-09-27: 5 mg via INTRAVENOUS
  Administered 2018-09-27: 10 mg via INTRAVENOUS
  Administered 2018-09-27 (×2): 5 mg via INTRAVENOUS
  Administered 2018-09-27: 10 mg via INTRAVENOUS
  Administered 2018-09-27: 5 mg via INTRAVENOUS
  Administered 2018-09-27 (×2): 10 mg via INTRAVENOUS
  Administered 2018-09-27: 5 mg via INTRAVENOUS

## 2018-09-27 MED ORDER — BISACODYL 5 MG PO TBEC
10.0000 mg | DELAYED_RELEASE_TABLET | Freq: Every day | ORAL | Status: DC
  Administered 2018-09-28 – 2018-10-02 (×5): 10 mg via ORAL
  Filled 2018-09-27 (×7): qty 2

## 2018-09-27 MED ORDER — SODIUM CHLORIDE (PF) 0.9 % IJ SOLN
INTRAMUSCULAR | Status: AC
  Filled 2018-09-27: qty 10

## 2018-09-27 SURGICAL SUPPLY — 99 items
ADAPTER CARDIO PERF ANTE/RETRO (ADAPTER) ×3 IMPLANT
APPLICATOR COTTON TIP 6IN STRL (MISCELLANEOUS) IMPLANT
ATRICLIP EXCLUSION VLAA 35 (Miscellaneous) ×3 IMPLANT
BAG DECANTER FOR FLEXI CONT (MISCELLANEOUS) ×3 IMPLANT
BANDAGE ACE 4X5 VEL STRL LF (GAUZE/BANDAGES/DRESSINGS) ×3 IMPLANT
BANDAGE ACE 6X5 VEL STRL LF (GAUZE/BANDAGES/DRESSINGS) ×3 IMPLANT
BANDAGE ELASTIC 4 VELCRO ST LF (GAUZE/BANDAGES/DRESSINGS) ×3 IMPLANT
BANDAGE ELASTIC 6 VELCRO ST LF (GAUZE/BANDAGES/DRESSINGS) ×3 IMPLANT
BLADE STERNUM SYSTEM 6 (BLADE) ×3 IMPLANT
BLADE SURG 15 STRL LF DISP TIS (BLADE) ×2 IMPLANT
BLADE SURG 15 STRL SS (BLADE) ×1
BNDG GAUZE ELAST 4 BULKY (GAUZE/BANDAGES/DRESSINGS) ×3 IMPLANT
BOOT SUTURE AID YELLOW STND (SUTURE) IMPLANT
CABLE PACING FASLOC BLUE (MISCELLANEOUS) ×3 IMPLANT
CANISTER SUCT 3000ML PPV (MISCELLANEOUS) ×3 IMPLANT
CANNULA GUNDRY RCSP 15FR (MISCELLANEOUS) ×3 IMPLANT
CARDIOBLATE CARDIAC ABLATION (MISCELLANEOUS)
CATH CPB KIT GERHARDT (MISCELLANEOUS) ×3 IMPLANT
CATH HEART VENT LEFT (CATHETERS) ×2 IMPLANT
CATH THORACIC 28FR (CATHETERS) ×3 IMPLANT
CATH/SQUID NICHOLS JEHLE COR (CATHETERS) IMPLANT
COVER WAND RF STERILE (DRAPES) ×3 IMPLANT
CRADLE DONUT ADULT HEAD (MISCELLANEOUS) ×3 IMPLANT
DERMABOND ADVANCED (GAUZE/BANDAGES/DRESSINGS) ×1
DERMABOND ADVANCED .7 DNX12 (GAUZE/BANDAGES/DRESSINGS) ×2 IMPLANT
DEVICE CARDIOBLATE CARDIAC ABL (MISCELLANEOUS) IMPLANT
DEVICE EXCLUSIN ATRCLP VLAA 35 (Miscellaneous) ×2 IMPLANT
DRAIN CHANNEL 28F RND 3/8 FF (WOUND CARE) ×3 IMPLANT
DRAPE CARDIOVASCULAR INCISE (DRAPES) ×1
DRAPE SLUSH/WARMER DISC (DRAPES) ×3 IMPLANT
DRAPE SRG 135X102X78XABS (DRAPES) ×2 IMPLANT
DRESSING PEEL AND PLAC PRVNA20 (GAUZE/BANDAGES/DRESSINGS) ×2 IMPLANT
DRSG AQUACEL AG ADV 3.5X14 (GAUZE/BANDAGES/DRESSINGS) ×3 IMPLANT
DRSG PEEL AND PLACE PREVENA 20 (GAUZE/BANDAGES/DRESSINGS) ×3
ELECT BLADE 4.0 EZ CLEAN MEGAD (MISCELLANEOUS) ×3
ELECT CAUTERY BLADE 6.4 (BLADE) ×3 IMPLANT
ELECT REM PT RETURN 9FT ADLT (ELECTROSURGICAL) ×6
ELECTRODE BLDE 4.0 EZ CLN MEGD (MISCELLANEOUS) ×2 IMPLANT
ELECTRODE REM PT RTRN 9FT ADLT (ELECTROSURGICAL) ×4 IMPLANT
FELT TEFLON 1X6 (MISCELLANEOUS) ×9 IMPLANT
GAUZE SPONGE 4X4 12PLY STRL (GAUZE/BANDAGES/DRESSINGS) ×6 IMPLANT
GAUZE SPONGE 4X4 12PLY STRL LF (GAUZE/BANDAGES/DRESSINGS) ×6 IMPLANT
GLOVE BIO SURGEON STRL SZ 6.5 (GLOVE) ×9 IMPLANT
GOWN STRL REUS W/ TWL LRG LVL3 (GOWN DISPOSABLE) ×20 IMPLANT
GOWN STRL REUS W/TWL LRG LVL3 (GOWN DISPOSABLE) ×10
HEMOSTAT POWDER SURGIFOAM 1G (HEMOSTASIS) ×9 IMPLANT
HEMOSTAT SURGICEL 2X14 (HEMOSTASIS) ×3 IMPLANT
INSERT FOGARTY XLG (MISCELLANEOUS) IMPLANT
KIT BASIN OR (CUSTOM PROCEDURE TRAY) ×3 IMPLANT
KIT CATH SUCT 8FR (CATHETERS) ×3 IMPLANT
KIT SUCTION CATH 14FR (SUCTIONS) ×6 IMPLANT
KIT TURNOVER KIT B (KITS) ×3 IMPLANT
KIT VASOVIEW HEMOPRO 2 VH 4000 (KITS) ×3 IMPLANT
LEAD PACING MYOCARDI (MISCELLANEOUS) ×3 IMPLANT
LINE VENT (MISCELLANEOUS) ×3 IMPLANT
MARKER GRAFT CORONARY BYPASS (MISCELLANEOUS) ×9 IMPLANT
NS IRRIG 1000ML POUR BTL (IV SOLUTION) ×18 IMPLANT
PACK E OPEN HEART (SUTURE) ×3 IMPLANT
PACK OPEN HEART (CUSTOM PROCEDURE TRAY) ×3 IMPLANT
PAD ARMBOARD 7.5X6 YLW CONV (MISCELLANEOUS) ×6 IMPLANT
PAD ELECT DEFIB RADIOL ZOLL (MISCELLANEOUS) ×3 IMPLANT
PENCIL BUTTON HOLSTER BLD 10FT (ELECTRODE) ×3 IMPLANT
PROBE CRYO2-ABLATION MALLABLE (MISCELLANEOUS) IMPLANT
PUNCH AORTIC ROT 4.0MM RCL 40 (MISCELLANEOUS) ×3 IMPLANT
SET CARDIOPLEGIA MPS 5001102 (MISCELLANEOUS) ×3 IMPLANT
SPONGE LAP 18X18 RF (DISPOSABLE) ×9 IMPLANT
SUT BONE WAX W31G (SUTURE) ×3 IMPLANT
SUT ETHIBON 2 0 V 52N 30 (SUTURE) ×12 IMPLANT
SUT MNCRL AB 4-0 PS2 18 (SUTURE) ×3 IMPLANT
SUT PROLENE 3 0 RB 1 (SUTURE) ×3 IMPLANT
SUT PROLENE 3 0 SH 1 (SUTURE) ×3 IMPLANT
SUT PROLENE 3 0 SH1 36 (SUTURE) ×9 IMPLANT
SUT PROLENE 4 0 RB 1 (SUTURE) ×3
SUT PROLENE 4 0 TF (SUTURE) ×6 IMPLANT
SUT PROLENE 4-0 RB1 .5 CRCL 36 (SUTURE) ×6 IMPLANT
SUT PROLENE 5 0 C 1 36 (SUTURE) ×3 IMPLANT
SUT PROLENE 6 0 C 1 30 (SUTURE) ×3 IMPLANT
SUT PROLENE 6 0 CC (SUTURE) ×12 IMPLANT
SUT PROLENE 7 0 BV 1 (SUTURE) ×3 IMPLANT
SUT PROLENE 7 0 BV1 MDA (SUTURE) ×3 IMPLANT
SUT PROLENE 7.0 RB 3 (SUTURE) ×9 IMPLANT
SUT PROLENE 8 0 BV175 6 (SUTURE) ×15 IMPLANT
SUT SILK 3 0SH CR/8 30 (SUTURE) ×3 IMPLANT
SUT STEEL 6MS V (SUTURE) ×3 IMPLANT
SUT STEEL SZ 6 DBL 3X14 BALL (SUTURE) ×3 IMPLANT
SUT VIC AB 1 CTX 18 (SUTURE) ×6 IMPLANT
SUT VIC AB 2-0 CT1 27 (SUTURE) ×1
SUT VIC AB 2-0 CT1 TAPERPNT 27 (SUTURE) ×2 IMPLANT
SYSTEM SAHARA CHEST DRAIN ATS (WOUND CARE) ×3 IMPLANT
TAPE CLOTH SURG 4X10 WHT LF (GAUZE/BANDAGES/DRESSINGS) ×3 IMPLANT
TOWEL GREEN STERILE (TOWEL DISPOSABLE) ×3 IMPLANT
TOWEL GREEN STERILE FF (TOWEL DISPOSABLE) ×3 IMPLANT
TRAY FOLEY SLVR 16FR TEMP STAT (SET/KITS/TRAYS/PACK) ×3 IMPLANT
TUBE SUCT INTRACARD DLP 20F (MISCELLANEOUS) ×3 IMPLANT
TUBING INSUFFLATION (TUBING) ×3 IMPLANT
UNDERPAD 30X30 (UNDERPADS AND DIAPERS) ×3 IMPLANT
VALVE MAGNA EASE 21MM (Prosthesis & Implant Heart) ×3 IMPLANT
VENT LEFT HEART 12002 (CATHETERS) ×3
WATER STERILE IRR 1000ML POUR (IV SOLUTION) ×6 IMPLANT

## 2018-09-27 NOTE — Progress Notes (Signed)
      [address]       [place] [address]             [phone]     Pre Procedure note for inpatients:   Doris Jefferson has been scheduled for Procedure(s): CORONARY ARTERY BYPASS GRAFTING (CABG) (N/A) AORTIC VALVE REPLACEMENT (AVR) (N/A) CLIPPING OF ATRIAL APPENDAGE (N/A) POSSIBLE MAZE (N/A) TRANSESOPHAGEAL ECHOCARDIOGRAM (TEE) (N/A) today. The various methods of treatment have been discussed with the patient. After consideration of the risks, benefits and treatment options the patient has consented to the planned procedure.   The patient has been seen and labs reviewed. There are no changes in the patient's condition to prevent proceeding with the planned procedure today.  Recent labs:  Lab Results  Component Value Date   WBC 8.1 09/27/2018   HGB 11.5 (L) 09/27/2018   HCT 36.4 09/27/2018   PLT 148 (L) 09/27/2018   GLUCOSE 196 (H) 09/27/2018   CHOL 145 05/08/2018   TRIG 152 (H) 05/08/2018   HDL 43 05/08/2018   LDLCALC 72 05/08/2018   ALT 34 09/26/2018   AST 39 09/26/2018   NA 142 09/27/2018   K 4.2 09/27/2018   CL 110 09/27/2018   CREATININE 1.01 (H) 09/27/2018   BUN 13 09/27/2018   CO2 22 09/27/2018   TSH 3.000 05/08/2018   INR 1.21 09/26/2018    Grace Isaac, MD 09/27/2018 7:17 AM

## 2018-09-27 NOTE — Anesthesia Procedure Notes (Signed)
Central Venous Catheter Insertion Performed by: Nolon Nations, MD, anesthesiologist Start/End12/20/2019 7:15 AM, 09/27/2018 7:25 AM Patient location: Pre-op. Preanesthetic checklist: patient identified, IV checked, site marked, risks and benefits discussed, surgical consent, monitors and equipment checked, pre-op evaluation, timeout performed and anesthesia consent Hand hygiene performed  and maximum sterile barriers used  PA cath was placed.Swan type:thermodilution PA Cath depth:43 Procedure performed without using ultrasound guided technique. Attempts: 1 Patient tolerated the procedure well with no immediate complications.

## 2018-09-27 NOTE — Anesthesia Procedure Notes (Signed)
Procedure Name: Intubation Date/Time: 09/27/2018 8:00 AM Performed by: Jearld Pies, CRNA Pre-anesthesia Checklist: Patient identified, Emergency Drugs available, Suction available and Patient being monitored Patient Re-evaluated:Patient Re-evaluated prior to induction Oxygen Delivery Method: Circle System Utilized Preoxygenation: Pre-oxygenation with 100% oxygen Induction Type: IV induction and Rapid sequence Ventilation: Mask ventilation without difficulty Laryngoscope Size: Glidescope and 3 Grade View: Grade I Tube type: Oral Tube size: 8.0 mm Number of attempts: 1 Airway Equipment and Method: Stylet and Oral airway Placement Confirmation: ETT inserted through vocal cords under direct vision,  positive ETCO2 and breath sounds checked- equal and bilateral Secured at: 21 cm Tube secured with: Tape Dental Injury: Teeth and Oropharynx as per pre-operative assessment

## 2018-09-27 NOTE — Anesthesia Postprocedure Evaluation (Signed)
Anesthesia Post Note  Patient: Doris Jefferson  Procedure(s) Performed: CORONARY ARTERY BYPASS GRAFTING (CABG) x three, using left internal mammary artery and right leg greater saphenous vein harvested endoscopically (N/A Chest) AORTIC VALVE REPLACEMENT (AVR) USING MAGNA EASE SIZE 21 MM (N/A Chest) CLIPPING OF ATRIAL APPENDAGE USING ATRICURE FLEXV SIZE 35MM (N/A Chest) TRANSESOPHAGEAL ECHOCARDIOGRAM (TEE) (N/A )     Patient location during evaluation: SICU Anesthesia Type: General Level of consciousness: patient remains intubated per anesthesia plan Pain management: pain level controlled Vital Signs Assessment: post-procedure vital signs reviewed and stable Respiratory status: patient remains intubated per anesthesia plan Cardiovascular status: stable Postop Assessment: no apparent nausea or vomiting Anesthetic complications: no    Last Vitals:  Vitals:   09/27/18 1815 09/27/18 1830  BP: (!) 111/54 (!) 95/42  Pulse: 89 89  Resp: 14 15  Temp: (!) 36.3 C (!) 36.3 C  SpO2: 100% 100%    Last Pain:  Vitals:   09/27/18 0518  TempSrc: Oral  PainSc:                  Cristian Grieves

## 2018-09-27 NOTE — Progress Notes (Signed)
Nitric started in OR.

## 2018-09-27 NOTE — Anesthesia Procedure Notes (Addendum)
Central Venous Catheter Insertion Performed by: Nolon Nations, MD, anesthesiologist Start/End12/20/2019 6:25 AM, 09/27/2018 7:15 AM Patient location: Pre-op. Preanesthetic checklist: patient identified, IV checked, site marked, risks and benefits discussed, surgical consent, monitors and equipment checked, pre-op evaluation, timeout performed and anesthesia consent Position: Trendelenburg Lidocaine 1% used for infiltration and patient sedated Hand hygiene performed , maximum sterile barriers used  and Seldinger technique used Catheter size: 8.5 Fr Total catheter length 10. Central line was placed.Sheath introducer Swan type:thermodilution PA Cath depth:43 Procedure performed using ultrasound guided technique. Ultrasound Notes:anatomy identified, needle tip was noted to be adjacent to the nerve/plexus identified, no ultrasound evidence of intravascular and/or intraneural injection and image(s) printed for medical record Attempts: 3 Following insertion, line sutured, dressing applied and Biopatch. Post procedure assessment: blood return through all ports, free fluid flow and no air  Patient tolerated the procedure well with no immediate complications. Additional procedure comments: Attempted RIJ placement x 2. Wire would not thread further than ~ 10 cm. Moved to left. Wire freely mobile. Introducer with dilator passed ~3/4 the way in, but met with pronounced resistance. Wire remained freely mobile. Dilator passed without introducer catheter without resistance and wire remained mobile. Catheter placement attempted again, but again resistance. Dilator removed. Catheter intravascular. Able to aspirate and flush, but catheter would not advance. At this point I discussed with Dr. Nyoka Cowden and Dr. [name]. Several minutes passed and Dr. Ermalene Postin suggested attempting to advance the introducer with the dilator again. This time I was able to advance with only minimal resistance. Aspiration and flush without  issue and PA catheter placement was uneventful. [name]

## 2018-09-27 NOTE — Progress Notes (Signed)
Per Dr. [name], can start weaning nitric at midnight; once down to 5 ppm then okay to proceed with rapid ventilator wean. Will continue to closely monitor.  Sherlie Ban, RN

## 2018-09-27 NOTE — Anesthesia Preprocedure Evaluation (Addendum)
Anesthesia Evaluation  Patient identified by MRN, date of birth, ID band Patient awake    Reviewed: Allergy & Precautions, Patient's Chart, lab work & pertinent test results  Airway Mallampati: II  TM Distance: >3 FB     Dental   Pulmonary sleep apnea , pneumonia,    breath sounds clear to auscultation       Cardiovascular hypertension, + CAD and + DOE  + Valvular Problems/Murmurs  Rhythm:Regular Rate:Normal     Neuro/Psych    GI/Hepatic Neg liver ROS, GERD  ,  Endo/Other  diabetes  Renal/GU negative Renal ROS     Musculoskeletal   Abdominal   Peds  Hematology   Anesthesia Other Findings   Reproductive/Obstetrics                             Anesthesia Physical Anesthesia Plan  ASA: III  Anesthesia Plan: General   Post-op Pain Management:    Induction: Intravenous  PONV Risk Score and Plan: 3 and Treatment may vary due to age or medical condition  Airway Management Planned: Oral ETT  Additional Equipment:   Intra-op Plan:   Post-operative Plan: Post-operative intubation/ventilation  Informed Consent: I have reviewed the patients History and Physical, chart, labs and discussed the procedure including the risks, benefits and alternatives for the proposed anesthesia with the patient or authorized representative who has indicated his/her understanding and acceptance.   Dental advisory given  Plan Discussed with: CRNA and Anesthesiologist  Anesthesia Plan Comments:         Anesthesia Quick Evaluation

## 2018-09-27 NOTE — Transfer of Care (Signed)
Immediate Anesthesia Transfer of Care Note  Patient: Doris Jefferson  Procedure(s) Performed: CORONARY ARTERY BYPASS GRAFTING (CABG) x three, using left internal mammary artery and right leg greater saphenous vein harvested endoscopically (N/A Chest) AORTIC VALVE REPLACEMENT (AVR) USING MAGNA EASE SIZE 21 MM (N/A Chest) CLIPPING OF ATRIAL APPENDAGE USING ATRICURE FLEXV SIZE 35MM (N/A Chest) TRANSESOPHAGEAL ECHOCARDIOGRAM (TEE) (N/A )  Patient Location: ICU  Anesthesia Type:General  Level of Consciousness: Patient remains intubated per anesthesia plan  Airway & Oxygen Therapy: Patient remains intubated per anesthesia plan and Patient placed on Ventilator (see vital sign flow sheet for setting)   RT at bedside, Nitric oxide via inhalational port - 20 ppm maintained, SIMV mode maintained, 480 Vt, 12/5, 100% FiO2 - ventilation confirmed.  Post-op Assessment: Report given to RN and Post -op Vital signs reviewed and stable  Post vital signs: Reviewed and stable   Last Vitals:  Vitals Value Taken Time  BP 118/43 09/27/2018  3:45 PM  Temp 36.3 C 09/27/2018  3:54 PM  Pulse 90 AOO paced 09/27/2018  3:54 PM  Resp 12 09/27/2018  3:54 PM  SpO2 100 % 09/27/2018  3:54 PM  Vitals shown include unvalidated device data.  Last Pain:  Vitals:   09/27/18 0518  TempSrc: Oral  PainSc:          Complications: No apparent anesthesia complications

## 2018-09-27 NOTE — Progress Notes (Signed)
EVENING ROUNDS NOTE :     [address]       [place] [address]             [phone]                 Day of Surgery Procedure(s) (LRB): CORONARY ARTERY BYPASS GRAFTING (CABG) x three, using left internal mammary artery and right leg greater saphenous vein harvested endoscopically (N/A) AORTIC VALVE REPLACEMENT (AVR) USING MAGNA EASE SIZE 21 MM (N/A) CLIPPING OF ATRIAL APPENDAGE USING ATRICURE FLEXV SIZE 35MM (N/A) TRANSESOPHAGEAL ECHOCARDIOGRAM (TEE) (N/A)  Total Length of Stay:  LOS: 3 days  BP (!) 96/45 (BP Location: Left Arm)   Pulse 89   Temp 97.9 F (36.6 C) (Core)   Resp 14   Ht 5\' 3"  (1.6 m)   Wt 94 kg   SpO2 100%   BMI 36.71 kg/m   .Intake/Output      12/20 0701 - 12/21 0700   I.V. (mL/kg) 2815.5 (30)   Blood 650   NG/GT 30   IV Piggyback 464.6   Total Intake(mL/kg) 3960.1 (42.1)   Urine (mL/kg/hr) 1375 (1)   Drains 0   Blood 800   Chest Tube 100   Total Output 2275   Net +1685.1         . sodium chloride 20 mL/hr at 09/27/18 2000  . [START ON 09/28/2018] sodium chloride    . sodium chloride 20 mL/hr (09/27/18 1540)  . albumin human 12.5 g (09/27/18 1715)  . cefUROXime (ZINACEF)  IV Stopped (09/27/18 1851)  . dexmedetomidine (PRECEDEX) IV infusion 0.3 mcg/kg/hr (09/27/18 2000)  . DOPamine 3 mcg/kg/min (09/27/18 2000)  . famotidine (PEPCID) IV Stopped (09/27/18 1625)  . insulin 2.3 mL/hr at 09/27/18 2000  . lactated ringers    . lactated ringers    . lactated ringers 20 mL/hr at 09/27/18 2000  . magnesium sulfate 20 mL/hr at 09/27/18 2000  . milrinone 0.3 mcg/kg/min (09/27/18 2000)  . nitroGLYCERIN Stopped (09/27/18 1540)  . phenylephrine (NEO-SYNEPHRINE) Adult infusion 50 mcg/min (09/27/18 2106)  . vancomycin 1,000 mg (09/27/18 2112)     Lab Results  Component Value Date   WBC 11.8 (H) 09/27/2018   HGB 11.7 (L) 09/27/2018   HCT 37.2 09/27/2018   PLT PLATELET CLUMPS NOTED ON SMEAR, UNABLE TO ESTIMATE 09/27/2018   GLUCOSE  171 (H) 09/27/2018   CHOL 145 05/08/2018   TRIG 152 (H) 05/08/2018   HDL 43 05/08/2018   LDLCALC 72 05/08/2018   ALT 34 09/26/2018   AST 39 09/26/2018   NA 144 09/27/2018   K 4.1 09/27/2018   CL 108 09/27/2018   CREATININE 0.50 09/27/2018   BUN 11 09/27/2018   CO2 22 09/27/2018   TSH 3.000 05/08/2018   INR 1.47 09/27/2018   On NO,  Not bleeding Waking up , moves and extremities to command  CI>2.0 Wean vent and NO as tolerated    Grace Isaac MD  Beeper [phone] Office [phone] 09/27/2018 9:25 PM

## 2018-09-27 NOTE — Progress Notes (Signed)
2D Echocardiogram has been performed.  Doris Jefferson 09/27/2018, 8:58 AM

## 2018-09-27 NOTE — Brief Op Note (Signed)
      [address]       [place] [address]             [phone]       09/27/2018  9:25 PM  PATIENT:  Doris Jefferson  77 y.o. female  PRE-OPERATIVE DIAGNOSIS:  CAD AS  POST-OPERATIVE DIAGNOSIS:  CAD AS  PROCEDURE:  Procedure(s):  CORONARY ARTERY BYPASS GRAFTING x 3 -LIMA to LAD -SVG to OM -SVG to RCA  ENDOSCOPIC HARVEST OF GREATER SAPHENOUS VEIN -Right Lung  AORTIC VALVE REPLACEMENT  -21 mm Edwards Magna Ease Pericardial Tissue Valve  CLIPPING OF ATRIAL APPENDAGE  -Atricure 35 FlexV ProClip  TRANSESOPHAGEAL ECHOCARDIOGRAM (TEE) (N/A)  SURGEON:  Surgeon(s) and Role:    * Grace Isaac, MD - Primary  PHYSICIAN ASSISTANT: Erin Barrett PA-C  ANESTHESIA:   general  EBL:  800 mL   BLOOD ADMINISTERED: 2U PRBC and CELLSAVER  DRAINS: Left Pleural Chest Tube, Mediastinal Chest Drains   LOCAL MEDICATIONS USED:  NONE  SPECIMEN:  Source of Specimen:  Aortic Valve Leaflets  DISPOSITION OF SPECIMEN:  PATHOLOGY  COUNTS:  YES   DICTATION: .Dragon Dictation  PLAN OF CARE: Admit to inpatient   PATIENT DISPOSITION:  ICU - intubated and hemodynamically stable.   Delay start of Pharmacological VTE agent (>24hrs) due to surgical blood loss or risk of bleeding: yes

## 2018-09-27 NOTE — Anesthesia Procedure Notes (Signed)
Arterial Line Insertion Start/End12/20/2019 6:45 AM, 09/27/2018 6:48 AM Performed by: Jearld Pies, CRNA, CRNA  Patient location: Pre-op. Preanesthetic checklist: patient identified, IV checked, site marked, risks and benefits discussed, surgical consent, monitors and equipment checked, pre-op evaluation, timeout performed and anesthesia consent Left, radial was placed Catheter size: 20 G Hand hygiene performed , maximum sterile barriers used  and Seldinger technique used Allen's test indicative of satisfactory collateral circulation Attempts: 2 Procedure performed without using ultrasound guided technique. Following insertion, dressing applied and Biopatch. Post procedure assessment: normal  Patient tolerated the procedure well with no immediate complications.

## 2018-09-27 NOTE — OR Nursing (Signed)
14:15 - 45 minute call to SICU charge nurse 15:00 - 20 minute call to SICU charge nurse

## 2018-09-28 ENCOUNTER — Inpatient Hospital Stay (HOSPITAL_COMMUNITY): Payer: Medicare Other

## 2018-09-28 DIAGNOSIS — Z952 Presence of prosthetic heart valve: Secondary | ICD-10-CM

## 2018-09-28 DIAGNOSIS — Z951 Presence of aortocoronary bypass graft: Secondary | ICD-10-CM

## 2018-09-28 LAB — BASIC METABOLIC PANEL
Anion gap: 8 (ref 5–15)
BUN: 12 mg/dL (ref 8–23)
CO2: 21 mmol/L — ABNORMAL LOW (ref 22–32)
Calcium: 7.6 mg/dL — ABNORMAL LOW (ref 8.9–10.3)
Chloride: 114 mmol/L — ABNORMAL HIGH (ref 98–111)
Creatinine, Ser: 0.66 mg/dL (ref 0.44–1.00)
Glucose, Bld: 108 mg/dL — ABNORMAL HIGH (ref 70–99)
Potassium: 4 mmol/L (ref 3.5–5.1)
Sodium: 143 mmol/L (ref 135–145)

## 2018-09-28 LAB — POCT I-STAT 3, ART BLOOD GAS (G3+)
Acid-base deficit: 3 mmol/L — ABNORMAL HIGH (ref 0.0–2.0)
Acid-base deficit: 4 mmol/L — ABNORMAL HIGH (ref 0.0–2.0)
Bicarbonate: 20.3 mmol/L (ref 20.0–28.0)
Bicarbonate: 21.5 mmol/L (ref 20.0–28.0)
O2 Saturation: 99 %
O2 Saturation: 97 %
Patient temperature: 37.7
Patient temperature: 37.7
TCO2: 21 mmol/L — ABNORMAL LOW (ref 22–32)
TCO2: 23 mmol/L (ref 22–32)
pCO2 arterial: 29.3 mmHg — ABNORMAL LOW (ref 32.0–48.0)
pCO2 arterial: 39.7 mmHg (ref 32.0–48.0)
pH, Arterial: 7.452 — ABNORMAL HIGH (ref 7.350–7.450)
pH, Arterial: 7.345 — ABNORMAL LOW (ref 7.350–7.450)
pO2, Arterial: 124 mmHg — ABNORMAL HIGH (ref 83.0–108.0)
pO2, Arterial: 97 mmHg (ref 83.0–108.0)

## 2018-09-28 LAB — GLUCOSE, CAPILLARY
GLUCOSE-CAPILLARY: 124 mg/dL — AB (ref 70–99)
Glucose-Capillary: 100 mg/dL — ABNORMAL HIGH (ref 70–99)
Glucose-Capillary: 101 mg/dL — ABNORMAL HIGH (ref 70–99)
Glucose-Capillary: 105 mg/dL — ABNORMAL HIGH (ref 70–99)
Glucose-Capillary: 106 mg/dL — ABNORMAL HIGH (ref 70–99)
Glucose-Capillary: 108 mg/dL — ABNORMAL HIGH (ref 70–99)
Glucose-Capillary: 113 mg/dL — ABNORMAL HIGH (ref 70–99)
Glucose-Capillary: 115 mg/dL — ABNORMAL HIGH (ref 70–99)
Glucose-Capillary: 121 mg/dL — ABNORMAL HIGH (ref 70–99)
Glucose-Capillary: 123 mg/dL — ABNORMAL HIGH (ref 70–99)
Glucose-Capillary: 137 mg/dL — ABNORMAL HIGH (ref 70–99)
Glucose-Capillary: 146 mg/dL — ABNORMAL HIGH (ref 70–99)
Glucose-Capillary: 231 mg/dL — ABNORMAL HIGH (ref 70–99)
Glucose-Capillary: 263 mg/dL — ABNORMAL HIGH (ref 70–99)
Glucose-Capillary: 89 mg/dL (ref 70–99)
Glucose-Capillary: 93 mg/dL (ref 70–99)
Glucose-Capillary: 95 mg/dL (ref 70–99)

## 2018-09-28 LAB — COOXEMETRY PANEL
Carboxyhemoglobin: 1.5 % (ref 0.5–1.5)
Methemoglobin: 1.3 % (ref 0.0–1.5)
O2 SAT: 63.1 %
Total hemoglobin: 11.4 g/dL — ABNORMAL LOW (ref 12.0–16.0)

## 2018-09-28 LAB — POCT I-STAT, CHEM 8
BUN: 16 mg/dL (ref 8–23)
Calcium, Ion: 1.14 mmol/L — ABNORMAL LOW (ref 1.15–1.40)
Chloride: 111 mmol/L (ref 98–111)
Creatinine, Ser: 0.9 mg/dL (ref 0.44–1.00)
Glucose, Bld: 173 mg/dL — ABNORMAL HIGH (ref 70–99)
HCT: 34 % — ABNORMAL LOW (ref 36.0–46.0)
HEMOGLOBIN: 11.6 g/dL — AB (ref 12.0–15.0)
Potassium: 4.3 mmol/L (ref 3.5–5.1)
Sodium: 143 mmol/L (ref 135–145)
TCO2: 23 mmol/L (ref 22–32)

## 2018-09-28 LAB — CBC
HCT: 37.2 % (ref 36.0–46.0)
HCT: 36 % (ref 36.0–46.0)
Hemoglobin: 11.8 g/dL — ABNORMAL LOW (ref 12.0–15.0)
Hemoglobin: 11.2 g/dL — ABNORMAL LOW (ref 12.0–15.0)
MCH: 28.6 pg (ref 26.0–34.0)
MCH: 28.4 pg (ref 26.0–34.0)
MCHC: 31.7 g/dL (ref 30.0–36.0)
MCHC: 31.1 g/dL (ref 30.0–36.0)
MCV: 90.1 fL (ref 80.0–100.0)
MCV: 91.1 fL (ref 80.0–100.0)
PLATELETS: 108 10*3/uL — AB (ref 150–400)
PLATELETS: 116 10*3/uL — AB (ref 150–400)
RBC: 4.13 MIL/uL (ref 3.87–5.11)
RBC: 3.95 MIL/uL (ref 3.87–5.11)
RDW: 15.2 % (ref 11.5–15.5)
RDW: 15.7 % — ABNORMAL HIGH (ref 11.5–15.5)
WBC: 14 10*3/uL — ABNORMAL HIGH (ref 4.0–10.5)
WBC: 19.3 10*3/uL — ABNORMAL HIGH (ref 4.0–10.5)
nRBC: 0 % (ref 0.0–0.2)
nRBC: 0 % (ref 0.0–0.2)

## 2018-09-28 LAB — MAGNESIUM
MAGNESIUM: 2.7 mg/dL — AB (ref 1.7–2.4)
Magnesium: 2.6 mg/dL — ABNORMAL HIGH (ref 1.7–2.4)

## 2018-09-28 LAB — CREATININE, SERUM
Creatinine, Ser: 1.13 mg/dL — ABNORMAL HIGH (ref 0.44–1.00)
GFR calc Af Amer: 54 mL/min — ABNORMAL LOW (ref >60)
GFR calc non Af Amer: 47 mL/min — ABNORMAL LOW (ref >60)

## 2018-09-28 MED ORDER — INSULIN DETEMIR 100 UNIT/ML ~~LOC~~ SOLN
15.0000 [IU] | Freq: Once | SUBCUTANEOUS | Status: AC
  Administered 2018-09-28: 15 [IU] via SUBCUTANEOUS
  Filled 2018-09-28: qty 0.15

## 2018-09-28 MED ORDER — ENOXAPARIN SODIUM 30 MG/0.3ML ~~LOC~~ SOLN
30.0000 mg | Freq: Every day | SUBCUTANEOUS | Status: DC
  Administered 2018-09-28 – 2018-10-02 (×5): 30 mg via SUBCUTANEOUS
  Filled 2018-09-28 (×5): qty 0.3

## 2018-09-28 MED ORDER — DOPAMINE-DEXTROSE 3.2-5 MG/ML-% IV SOLN
2.5000 ug/kg/min | INTRAVENOUS | Status: DC
  Administered 2018-09-29 – 2018-09-30 (×2): 2.5 ug/kg/min via INTRAVENOUS
  Filled 2018-09-28 (×2): qty 250

## 2018-09-28 MED ORDER — FUROSEMIDE 10 MG/ML IJ SOLN
8.0000 mg/h | INTRAVENOUS | Status: DC
  Administered 2018-09-28: 8 mg/h via INTRAVENOUS
  Administered 2018-09-29 – 2018-10-01 (×3): 12 mg/h via INTRAVENOUS
  Filled 2018-09-28 (×6): qty 25

## 2018-09-28 MED ORDER — INSULIN ASPART 100 UNIT/ML ~~LOC~~ SOLN
0.0000 [IU] | SUBCUTANEOUS | Status: DC
  Administered 2018-09-28: 12 [IU] via SUBCUTANEOUS
  Administered 2018-09-28: 8 [IU] via SUBCUTANEOUS
  Administered 2018-09-28: 2 [IU] via SUBCUTANEOUS
  Administered 2018-09-29 (×2): 4 [IU] via SUBCUTANEOUS
  Administered 2018-09-29: 2 [IU] via SUBCUTANEOUS
  Administered 2018-09-29: 4 [IU] via SUBCUTANEOUS
  Administered 2018-09-30: 2 [IU] via SUBCUTANEOUS
  Administered 2018-09-30 (×2): 4 [IU] via SUBCUTANEOUS
  Administered 2018-10-01 (×2): 2 [IU] via SUBCUTANEOUS
  Administered 2018-10-01: 4 [IU] via SUBCUTANEOUS

## 2018-09-28 MED ORDER — ORAL CARE MOUTH RINSE
15.0000 mL | Freq: Two times a day (BID) | OROMUCOSAL | Status: DC
  Administered 2018-09-28 – 2018-10-07 (×17): 15 mL via OROMUCOSAL

## 2018-09-28 MED ORDER — ASPIRIN 81 MG PO CHEW: 81.0000 mg | CHEWABLE_TABLET | Freq: Every day | ORAL | Status: DC

## 2018-09-28 MED ORDER — ASPIRIN EC 81 MG PO TBEC
81.0000 mg | DELAYED_RELEASE_TABLET | Freq: Every day | ORAL | Status: DC
  Administered 2018-09-29 – 2018-10-09 (×11): 81 mg via ORAL
  Filled 2018-09-28 (×11): qty 1

## 2018-09-28 MED ORDER — INSULIN DETEMIR 100 UNIT/ML ~~LOC~~ SOLN
15.0000 [IU] | Freq: Every day | SUBCUTANEOUS | Status: DC
  Administered 2018-09-29 – 2018-10-01 (×2): 15 [IU] via SUBCUTANEOUS
  Filled 2018-09-28 (×4): qty 0.15

## 2018-09-28 MED ORDER — FUROSEMIDE 10 MG/ML IJ SOLN
40.0000 mg | Freq: Once | INTRAMUSCULAR | Status: AC
  Administered 2018-09-28: 40 mg via INTRAVENOUS
  Filled 2018-09-28: qty 4

## 2018-09-28 NOTE — Progress Notes (Signed)
EVENING ROUNDS NOTE :     [address]       [place] [address]             [phone]                 1 Day Post-Op Procedure(s) (LRB): CORONARY ARTERY BYPASS GRAFTING (CABG) x three, using left internal mammary artery and right leg greater saphenous vein harvested endoscopically (N/A) AORTIC VALVE REPLACEMENT (AVR) USING MAGNA EASE SIZE 21 MM (N/A) CLIPPING OF ATRIAL APPENDAGE USING ATRICURE FLEXV SIZE 35MM (N/A) TRANSESOPHAGEAL ECHOCARDIOGRAM (TEE) (N/A)  Total Length of Stay:  LOS: 4 days  BP (!) 100/37   Pulse 89   Temp 98.1 F (36.7 C) (Oral)   Resp 12   Ht 5\' 3"  (1.6 m)   Wt 109.4 kg   SpO2 100%   BMI 42.72 kg/m   .Intake/Output      12/20 0701 - 12/21 0700 12/21 0701 - 12/22 0700   P.O.  120   I.V. (mL/kg) 3770.6 (34.5) 530.4 (4.8)   Blood 650    NG/GT 130 30   IV Piggyback 847.6    Total Intake(mL/kg) 5398.2 (49.3) 680.4 (6.2)   Urine (mL/kg/hr) 1790 (0.7) 635 (0.5)   Emesis/NG output 50 30   Drains 0 0   Blood 800    Chest Tube 320 250   Total Output 2960 915   Net +2438.2 -234.6          . sodium chloride Stopped (09/28/18 1109)  . sodium chloride    . sodium chloride Stopped (09/27/18 2200)  . cefUROXime (ZINACEF)  IV Stopped (09/28/18 0542)  . dexmedetomidine (PRECEDEX) IV infusion Stopped (09/28/18 0737)  . DOPamine 2.5 mcg/kg/min (09/28/18 1700)  . furosemide (LASIX) infusion 8 mg/hr (09/28/18 1700)  . lactated ringers    . lactated ringers    . lactated ringers 20 mL/hr at 09/28/18 1700  . milrinone 0.125 mcg/kg/min (09/28/18 1700)  . nitroGLYCERIN Stopped (09/27/18 1540)  . phenylephrine (NEO-SYNEPHRINE) Adult infusion 20 mcg/min (09/28/18 1700)     Lab Results  Component Value Date   WBC 19.3 (H) 09/28/2018   HGB 11.6 (L) 09/28/2018   HCT 34.0 (L) 09/28/2018   PLT 116 (L) 09/28/2018   GLUCOSE 173 (H) 09/28/2018   CHOL 145 05/08/2018   TRIG 152 (H) 05/08/2018   HDL 43 05/08/2018   LDLCALC 72 05/08/2018   ALT 34  09/26/2018   AST 39 09/26/2018   NA 143 09/28/2018   K 4.3 09/28/2018   CL 111 09/28/2018   CREATININE 0.90 09/28/2018   BUN 16 09/28/2018   CO2 21 (L) 09/28/2018   TSH 3.000 05/08/2018   INR 1.47 09/27/2018   Stable day Tolerating extubation Now up in chair, started on Lasix drip Creatinine this morning normal   Grace Isaac MD  Beeper [phone] Office [phone] 09/28/2018 5:40 PM

## 2018-09-28 NOTE — Procedures (Signed)
Extubation Procedure Note  Patient Details:   Name: [name] DOB: 25-Apr-1941 MRN: [id number]   Airway Documentation:    Vent end date: 09/28/18 Vent end time: 0926   Evaluation  O2 sats: stable throughout Complications: No apparent complications Patient did tolerate procedure well. Bilateral Breath Sounds: Clear   Yes  NIF-25 FVC-977ml PLace3d on 4l/min [place] with Nitric at 5 ppm  Revonda Standard 09/28/2018, 9:27 AM

## 2018-09-28 NOTE — Progress Notes (Signed)
Patient ID: Doris Jefferson, female   DOB: [birth year], 77 y.o.   MRN: [id number] T[facility] DAILY ICU PROGRESS NOTE                   [address]            [place] [address]          [phone]   1 Day Post-Op Procedure(s) (LRB): CORONARY ARTERY BYPASS GRAFTING (CABG) x three, using left internal mammary artery and right leg greater saphenous vein harvested endoscopically (N/A) AORTIC VALVE REPLACEMENT (AVR) USING MAGNA EASE SIZE 21 MM (N/A) CLIPPING OF ATRIAL APPENDAGE USING ATRICURE FLEXV SIZE 35MM (N/A) TRANSESOPHAGEAL ECHOCARDIOGRAM (TEE) (N/A)  Total Length of Stay:  LOS: 4 days   Subjective: Patient awake alert neurologically intact now extubated,   Objective: Vital signs in last 24 hours: Temp:  [97 F (36.1 C)-100.9 F (38.3 C)] 99.9 F (37.7 C) (12/21 0810) Pulse Rate:  [89-90] 89 (12/21 0810) Cardiac Rhythm: Atrial paced (12/21 0800) Resp:  [0-22] 18 (12/21 0810) BP: (90-143)/(36-58) 113/42 (12/21 0800) SpO2:  [98 %-100 %] 100 % (12/21 0810) Arterial Line BP: (73-148)/(35-78) 148/49 (12/21 0810) FiO2 (%):  [40 %-100 %] 40 % (12/21 0800) Weight:  [109.4 kg] 109.4 kg (12/21 0400)  Filed Weights   09/25/18 0439 09/27/18 0518 09/28/18 0400  Weight: 92.1 kg 94 kg 109.4 kg    Weight change: 15.4 kg   Hemodynamic parameters for last 24 hours: PAP: (40-55)/(15-26) 51/20 CO:  [3.5 L/min-5.2 L/min] 4.3 L/min CI:  [1.8 L/min/m2-2.6 L/min/m2] 2.2 L/min/m2  Intake/Output from previous day: 12/20 0701 - 12/21 0700 In: 5398.2 [I.V.:3770.6; Blood:650; NG/GT:130; IV Piggyback:847.6] Out: 2960 [Urine:1790; Emesis/NG output:50; Blood:800; Chest Tube:320]  Intake/Output this shift: Total I/O In: 120.9 [I.V.:90.9; NG/GT:30] Out: 90 [Urine:30; Emesis/NG output:30; Chest Tube:30]  Current Meds: Scheduled Meds: . acetaminophen  1,000 mg Oral Q6H   Or  . acetaminophen (TYLENOL) oral liquid 160 mg/5 mL  1,000 mg Per Tube Q6H  . aspirin EC  325 mg Oral Daily   Or  . aspirin  324 mg Per Tube Daily  . bisacodyl  10 mg Oral Daily   Or  . bisacodyl  10 mg Rectal Daily  . buPROPion  300 mg Oral Daily  . chlorhexidine gluconate (MEDLINE KIT)  15 mL Mouth Rinse BID  . Chlorhexidine Gluconate Cloth  6 each Topical Daily  . docusate sodium  200 mg Oral Daily  . insulin regular  0-10 Units Intravenous TID WC  . loratadine  10 mg Oral Daily  . mouth rinse  15 mL Mouth Rinse 10 times per day  . metoCLOPramide (REGLAN) injection  10 mg Intravenous Q6H  . metoprolol tartrate  12.5 mg Oral BID   Or  . metoprolol tartrate  12.5 mg Per Tube BID  . [START ON 09/29/2018] pantoprazole  40 mg Oral Daily  . rosuvastatin  20 mg Oral Daily  . sodium chloride flush  10-40 mL Intracatheter Q12H  . sodium chloride flush  3 mL Intravenous Q12H   Continuous Infusions: . sodium chloride 20 mL/hr at 09/28/18 0800  . sodium chloride    . sodium chloride Stopped (09/27/18 2200)  . albumin human Stopped (09/27/18 1730)  . cefUROXime (ZINACEF)  IV Stopped (09/28/18 0542)  . dexmedetomidine (PRECEDEX) IV infusion Stopped (09/28/18 0737)  . DOPamine 3 mcg/kg/min (09/28/18 0800)  . insulin 1.6 mL/hr at 09/28/18 0800  . lactated ringers    . lactated ringers    .  lactated ringers 20 mL/hr at 09/28/18 0800  . milrinone 0.125 mcg/kg/min (09/28/18 0804)  . nitroGLYCERIN Stopped (09/27/18 1540)  . phenylephrine (NEO-SYNEPHRINE) Adult infusion 25 mcg/min (09/28/18 0800)   PRN Meds:.sodium chloride, albumin human, lactated ringers, metoprolol tartrate, midazolam, morphine injection, ondansetron (ZOFRAN) IV, oxyCODONE, sodium chloride flush, sodium chloride flush, traMADol  General appearance: alert, cooperative and no distress Neurologic: intact Heart: regular rate and rhythm, S1, S2 normal, no murmur, click, rub or gallop Lungs: diminished breath sounds bibasilar Abdomen: soft, non-tender; bowel sounds normal; no masses,  no organomegaly Extremities: extremities normal,  atraumatic, no cyanosis or edema and Homans sign is negative, no sign of DVT Wound: Incisional wound VAC in place  Lab Results: CBC: Recent Labs    09/27/18 2156 09/27/18 2200 09/28/18 0402  WBC 15.4*  --  14.0*  HGB 11.7* 11.2* 11.8*  HCT 36.9 33.0* 37.2  PLT PLATELET CLUMPS NOTED ON SMEAR, UNABLE TO ESTIMATE  --  108*   BMET:  Recent Labs    09/27/18 0535  09/27/18 2200 09/28/18 0402  NA 142   < > 143 143  K 4.2   < > 3.9 4.0  CL 110   < > 111 114*  CO2 22  --   --  21*  GLUCOSE 196*   < > 160* 108*  BUN 13   < > 12 12  CREATININE 1.01*   < > 0.60 0.66  CALCIUM 9.0  --   --  7.6*   < > = values in this interval not displayed.    CMET: Lab Results  Component Value Date   WBC 14.0 (H) 09/28/2018   HGB 11.8 (L) 09/28/2018   HCT 37.2 09/28/2018   PLT 108 (L) 09/28/2018   GLUCOSE 108 (H) 09/28/2018   CHOL 145 05/08/2018   TRIG 152 (H) 05/08/2018   HDL 43 05/08/2018   LDLCALC 72 05/08/2018   ALT 34 09/26/2018   AST 39 09/26/2018   NA 143 09/28/2018   K 4.0 09/28/2018   CL 114 (H) 09/28/2018   CREATININE 0.66 09/28/2018   BUN 12 09/28/2018   CO2 21 (L) 09/28/2018   TSH 3.000 05/08/2018   INR 1.47 09/27/2018      PT/INR:  Recent Labs    09/27/18 1542  LABPROT 17.6*  INR 1.47   Radiology: Dg Chest Port 1 View  Result Date: 09/28/2018 CLINICAL DATA:  Postop from CABG and aortic valve replacement. EXAM: PORTABLE CHEST 1 VIEW COMPARISON:  09/27/2018 FINDINGS: Support lines and tubes remain in appropriate position. Left mid and lower lung atelectasis is again seen along the course of the left chest tube, without significant change. No pneumothorax visualized. Right lung is clear. Heart size is stable. IMPRESSION: No significant change in left lung atelectasis. No pneumothorax visualized. Electronically Signed   By: Earle Gell M.D.   On: 09/28/2018 08:34   Dg Chest Port 1 View  Result Date: 09/27/2018 CLINICAL DATA:  Status post CABG. EXAM: PORTABLE CHEST  1 VIEW COMPARISON:  09/26/2008 FINDINGS: Interval post CABG changes. Endotracheal tube tip not well visualized, most likely 2 cm above the carina. Left jugular Swan-Ganz catheter tip in the main pulmonary artery or distal pulmonary outflow tract. Interval prosthetic aortic valve and left atrial clip. Nasogastric tube extending into the stomach. Left chest tube with interval somewhat rounded and oval density in the left mid lung zone and subsegmental atelectasis in the left lower lung zone and perihilar region. Clear right lung. Stable mildly prominent  interstitial markings. No pneumothorax. Mildly enlarged cardiac silhouette. Cervical spine fixation hardware and thoracic spine degenerative changes. IMPRESSION: 1. Interval postoperative changes, as described above. 2. Left lung atelectasis and possible loculated pleural fluid in the major fissure. 3. Stable mild cardiomegaly and mild chronic interstitial lung disease. Electronically Signed   By: Claudie Revering M.D.   On: 09/27/2018 17:00     Assessment/Plan: S/P Procedure(s) (LRB): CORONARY ARTERY BYPASS GRAFTING (CABG) x three, using left internal mammary artery and right leg greater saphenous vein harvested endoscopically (N/A) AORTIC VALVE REPLACEMENT (AVR) USING MAGNA EASE SIZE 21 MM (N/A) CLIPPING OF ATRIAL APPENDAGE USING ATRICURE FLEXV SIZE 35MM (N/A) TRANSESOPHAGEAL ECHOCARDIOGRAM (TEE) (N/A) Mobilize Diuresis Diabetes control Expected Acute  Blood - loss Anemia- continue to monitor -due to preop anemia patient did require 2 units of packed red blood cells during the procedure Tolerating extubation Renal function stable Holding sinus rhythm Decrease dopamine    Grace Isaac 09/28/2018 10:10 AM

## 2018-09-28 NOTE — Progress Notes (Signed)
Advanced Heart Failure Rounding Note  PCP-Cardiologist: [employer], MD   Subjective:    S/p CABG x 3 with bioprosthetic AVR and LAA clip on 12/20  Awake. Extubated. Off NO . Luiz Blare out. On Dopa 2.5, milrinone 0.125 and neo 15. SBP 90-100. Feels SOB. Worn out. Chest sore.   PA systolics were 64-40HKVQ before swan pulled.    [facility] 09/24/18  Ost RCA to Prox RCA lesion is 75% stenosed.  Ost Cx to Prox Cx lesion is 100% stenosed.  Dist LM lesion is 80% stenosed.  Ost 1st Diag lesion is 75% stenosed.  Ost Ramus lesion is 75% stenosed.  Mild to moderate aortic stenosis (Mean gradient 20 mm Hg) Prox LAD lesion is 50% stenosed. Hemodynamics (mmHg) RA mean 8 RV 76/1 PA 76/25 (45) PCWP 16 AO 146/55 LV 166/12 PVR 4.9 WU Cardiac Output (Fick) 5.86 Cardiac Index (Fick) 2.99  Objective:   Weight Range: 109.4 kg Body mass index is 42.72 kg/m.   Vital Signs:   Temp:  [97 F (36.1 C)-100.9 F (38.3 C)] 99.9 F (37.7 C) (12/21 1100) Pulse Rate:  [89-90] 90 (12/21 1049) Resp:  [0-23] 23 (12/21 1000) BP: (90-143)/(34-58) 129/34 (12/21 1049) SpO2:  [98 %-100 %] 100 % (12/21 1000) Arterial Line BP: (73-148)/(35-78) 145/35 (12/21 1000) FiO2 (%):  [40 %-100 %] 40 % (12/21 0800) Weight:  [109.4 kg] 109.4 kg (12/21 0400) Last BM Date: 09/25/18  Weight change: Filed Weights   09/25/18 0439 09/27/18 0518 09/28/18 0400  Weight: 92.1 kg 94 kg 109.4 kg    Intake/Output:   Intake/Output Summary (Last 24 hours) at 09/28/2018 1317 Last data filed at 09/28/2018 1100 Gross per 24 hour  Intake 5657.78 ml  Output 2435 ml  Net 3222.78 ml      Physical Exam    General:  Lying In bed. Mildly SOB HEENT: normal Neck: supple. JVP to jaw  Carotids 2+ bilat; no bruits. No lymphadenopathy or thryomegaly appreciated. Cor: Regular. Sternal dressing ok. + CTs Lungs: coarse anteriorly  Abdomen: soft, nontender, + distended. Quiet  Extremities: no cyanosis, clubbing, rash,  1-2+ edema cool + SCDs Neuro: alert & orientedx3, cranial nerves grossly intact. moves all 4 extremities w/o difficulty. Affect pleasant   Telemetry   Apaced 90, personally reviewed.   EKG    No new tracings.    Labs    CBC Recent Labs    09/27/18 2156 09/27/18 2200 09/28/18 0402  WBC 15.4*  --  14.0*  HGB 11.7* 11.2* 11.8*  HCT 36.9 33.0* 37.2  MCV 89.6  --  90.1  PLT PLATELET CLUMPS NOTED ON SMEAR, UNABLE TO ESTIMATE  --  259*   Basic Metabolic Panel Recent Labs    09/27/18 0535  09/27/18 2156 09/27/18 2200 09/28/18 0402  NA 142   < >  --  143 143  K 4.2   < >  --  3.9 4.0  CL 110   < >  --  111 114*  CO2 22  --   --   --  21*  GLUCOSE 196*   < >  --  160* 108*  BUN 13   < >  --  12 12  CREATININE 1.01*   < > 0.72 0.60 0.66  CALCIUM 9.0  --   --   --  7.6*  MG  --   --  2.9*  --  2.7*   < > = values in this interval not displayed.  Liver Function Tests Recent Labs    09/26/18 0411  AST 39  ALT 34  ALKPHOS 56  BILITOT 0.8  PROT 5.7*  ALBUMIN 3.4*   No results for input(s): LIPASE, AMYLASE in the last 72 hours. Cardiac Enzymes No results for input(s): CKTOTAL, CKMB, CKMBINDEX, TROPONINI in the last 72 hours.  BNP: BNP (last 3 results) No results for input(s): BNP in the last 8760 hours.  ProBNP (last 3 results) No results for input(s): PROBNP in the last 8760 hours.   D-Dimer No results for input(s): DDIMER in the last 72 hours. Hemoglobin A1C No results for input(s): HGBA1C in the last 72 hours. Fasting Lipid Panel No results for input(s): CHOL, HDL, LDLCALC, TRIG, CHOLHDL, LDLDIRECT in the last 72 hours. Thyroid Function Tests No results for input(s): TSH, T4TOTAL, T3FREE, THYROIDAB in the last 72 hours.  Invalid input(s): FREET3  Other results:   Imaging    Dg Chest Port 1 View  Result Date: 09/28/2018 CLINICAL DATA:  Postop from CABG and aortic valve replacement. EXAM: PORTABLE CHEST 1 VIEW COMPARISON:  09/27/2018 FINDINGS:  Support lines and tubes remain in appropriate position. Left mid and lower lung atelectasis is again seen along the course of the left chest tube, without significant change. No pneumothorax visualized. Right lung is clear. Heart size is stable. IMPRESSION: No significant change in left lung atelectasis. No pneumothorax visualized. Electronically Signed   By: Earle Gell M.D.   On: 09/28/2018 08:34   Dg Chest Port 1 View  Result Date: 09/27/2018 CLINICAL DATA:  Status post CABG. EXAM: PORTABLE CHEST 1 VIEW COMPARISON:  09/26/2008 FINDINGS: Interval post CABG changes. Endotracheal tube tip not well visualized, most likely 2 cm above the carina. Left jugular Swan-Ganz catheter tip in the main pulmonary artery or distal pulmonary outflow tract. Interval prosthetic aortic valve and left atrial clip. Nasogastric tube extending into the stomach. Left chest tube with interval somewhat rounded and oval density in the left mid lung zone and subsegmental atelectasis in the left lower lung zone and perihilar region. Clear right lung. Stable mildly prominent interstitial markings. No pneumothorax. Mildly enlarged cardiac silhouette. Cervical spine fixation hardware and thoracic spine degenerative changes. IMPRESSION: 1. Interval postoperative changes, as described above. 2. Left lung atelectasis and possible loculated pleural fluid in the major fissure. 3. Stable mild cardiomegaly and mild chronic interstitial lung disease. Electronically Signed   By: Claudie Revering M.D.   On: 09/27/2018 17:00     Medications:     Scheduled Medications: . acetaminophen  1,000 mg Oral Q6H   Or  . acetaminophen (TYLENOL) oral liquid 160 mg/5 mL  1,000 mg Per Tube Q6H  . [START ON 09/29/2018] aspirin EC  81 mg Oral Daily   Or  . [START ON 09/29/2018] aspirin  81 mg Per Tube Daily  . bisacodyl  10 mg Oral Daily   Or  . bisacodyl  10 mg Rectal Daily  . buPROPion  300 mg Oral Daily  . chlorhexidine gluconate (MEDLINE KIT)  15 mL  Mouth Rinse BID  . Chlorhexidine Gluconate Cloth  6 each Topical Daily  . docusate sodium  200 mg Oral Daily  . enoxaparin (LOVENOX) injection  30 mg Subcutaneous QHS  . insulin aspart  0-24 Units Subcutaneous Q4H  . [START ON 09/29/2018] insulin detemir  15 Units Subcutaneous Daily  . insulin regular  0-10 Units Intravenous TID WC  . loratadine  10 mg Oral Daily  . mouth rinse  15 mL Mouth Rinse  10 times per day  . metoprolol tartrate  12.5 mg Oral BID   Or  . metoprolol tartrate  12.5 mg Per Tube BID  . [START ON 09/29/2018] pantoprazole  40 mg Oral Daily  . rosuvastatin  20 mg Oral Daily  . sodium chloride flush  10-40 mL Intracatheter Q12H  . sodium chloride flush  3 mL Intravenous Q12H    Infusions: . sodium chloride Stopped (09/28/18 1115)  . sodium chloride    . sodium chloride Stopped (09/27/18 2200)  . albumin human Stopped (09/27/18 1730)  . cefUROXime (ZINACEF)  IV Stopped (09/28/18 0542)  . dexmedetomidine (PRECEDEX) IV infusion Stopped (09/28/18 0737)  . DOPamine 2.5 mcg/kg/min (09/28/18 1048)  . insulin 1 mL/hr at 09/28/18 1000  . lactated ringers    . lactated ringers    . lactated ringers 20 mL/hr at 09/28/18 1000  . milrinone 0.125 mcg/kg/min (09/28/18 1000)  . nitroGLYCERIN Stopped (09/27/18 1540)  . phenylephrine (NEO-SYNEPHRINE) Adult infusion Stopped (09/28/18 0918)    PRN Medications: sodium chloride, albumin human, lactated ringers, metoprolol tartrate, midazolam, morphine injection, ondansetron (ZOFRAN) IV, oxyCODONE, sodium chloride flush, sodium chloride flush, traMADol    Patient Profile   Doris Jefferson is a 77 y.o. female with Chronic diastolic CHF, HTN, CAD, PAF, h/o OSA on CPAP, DM 2, HLD, moderate AS, and obesity.   Asked to see by [facility] for optimization prior to CABG.  Assessment/Plan   1. Severe 3v CAD - Severe with 75% RCA, 100% Cx, 80% LM and LAD 50% stenosed - S/p CABG x 3 with bioprosthetic AVR and LAA clip on 12/20 - Now POD#1    - Swan out. On Dopa 2.5, milrinone 0.125 and neo 15. SBP 90-100. PA systolics were 15-72IOMB before swan pulled.  - Feels cool on exam. Will check co-ox.  - Weight up 30+ pounds. Start lasix gtt at 8  2. Moderate AS - s/p bioprosthetic AVR - wide pulse pressure on exam. Will follow. - will need echo prior to d/c  3. Chronic diastolic CHF - Echo 55/9/74 LVEF 55-60%, Grade 2 DD, Mild/Mod AS, Mod MR, mild LAE, RV mild dilated, Mild TR, PA peak pressure 79 mm Hg.  - Volume status elevated post CABG - starting lasix gtt  4. 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 Suspect unlikely to benefit from pulmonary vasodilators.  - PA systolics were 16-38GTXM before swan pulled. Continue milrinone. Start lasix gtt  4. PAF - Remains in NSR by Tele - Continue amiodarone  - Resume AC when ok with surgery  6. OSA on CPAP - Continue CPAP.    7. DM 2 - Per TCTS  CRITICAL CARE Performed by: Glori Bickers  Total critical care time: 35 minutes  Critical care time was exclusive of separately billable procedures and treating other patients.  Critical care was necessary to treat or prevent imminent or life-threatening deterioration.  Critical care was time spent personally by me (independent of midlevel providers or residents) on the following activities: development of treatment plan with patient and/or surrogate as well as nursing, discussions with consultants, evaluation of patient's response to treatment, examination of patient, obtaining history from patient or surrogate, ordering and performing treatments and interventions, ordering and review of laboratory studies, ordering and review of radiographic studies, pulse oximetry and re-evaluation of patient's condition.  Length of Stay: [place], MD  09/28/2018, 1:17 PM  Advanced Heart  Failure Team Pager [phone] (M-F; 7a - 4p)  Please contact [facility] for night-coverage after hours (4p -7a ) and weekends on [email or web address]

## 2018-09-28 NOTE — Progress Notes (Signed)
RT note-Nitric taken down to one and now set at zero, patient tolerating well.

## 2018-09-29 ENCOUNTER — Inpatient Hospital Stay (HOSPITAL_COMMUNITY): Payer: Medicare Other

## 2018-09-29 ENCOUNTER — Inpatient Hospital Stay: Payer: Self-pay

## 2018-09-29 LAB — GLUCOSE, CAPILLARY
GLUCOSE-CAPILLARY: 164 mg/dL — AB (ref 70–99)
GLUCOSE-CAPILLARY: 116 mg/dL — AB (ref 70–99)
Glucose-Capillary: 180 mg/dL — ABNORMAL HIGH (ref 70–99)
Glucose-Capillary: 184 mg/dL — ABNORMAL HIGH (ref 70–99)
Glucose-Capillary: 121 mg/dL — ABNORMAL HIGH (ref 70–99)
Glucose-Capillary: 116 mg/dL — ABNORMAL HIGH (ref 70–99)

## 2018-09-29 LAB — BASIC METABOLIC PANEL
Anion gap: 11 (ref 5–15)
BUN: 21 mg/dL (ref 8–23)
CO2: 19 mmol/L — ABNORMAL LOW (ref 22–32)
Calcium: 8.1 mg/dL — ABNORMAL LOW (ref 8.9–10.3)
Chloride: 111 mmol/L (ref 98–111)
Creatinine, Ser: 1.71 mg/dL — ABNORMAL HIGH (ref 0.44–1.00)
GFR calc Af Amer: 33 mL/min — ABNORMAL LOW (ref >60)
GFR calc non Af Amer: 28 mL/min — ABNORMAL LOW (ref >60)
Glucose, Bld: 180 mg/dL — ABNORMAL HIGH (ref 70–99)
Potassium: 4.2 mmol/L (ref 3.5–5.1)
Sodium: 141 mmol/L (ref 135–145)

## 2018-09-29 LAB — COOXEMETRY PANEL
Carboxyhemoglobin: 1.2 % (ref 0.5–1.5)
Methemoglobin: 1.7 % — ABNORMAL HIGH (ref 0.0–1.5)
O2 Saturation: 67.1 %
Total hemoglobin: 10.5 g/dL — ABNORMAL LOW (ref 12.0–16.0)

## 2018-09-29 LAB — CBC
HCT: 35.2 % — ABNORMAL LOW (ref 36.0–46.0)
Hemoglobin: 10.7 g/dL — ABNORMAL LOW (ref 12.0–15.0)
MCH: 28.2 pg (ref 26.0–34.0)
MCHC: 30.4 g/dL (ref 30.0–36.0)
MCV: 92.6 fL (ref 80.0–100.0)
Platelets: 124 10*3/uL — ABNORMAL LOW (ref 150–400)
RBC: 3.8 MIL/uL — ABNORMAL LOW (ref 3.87–5.11)
RDW: 15.9 % — ABNORMAL HIGH (ref 11.5–15.5)
WBC: 21.6 10*3/uL — ABNORMAL HIGH (ref 4.0–10.5)
nRBC: 0 % (ref 0.0–0.2)

## 2018-09-29 MED ORDER — AMIODARONE HCL 200 MG PO TABS
400.0000 mg | ORAL_TABLET | Freq: Two times a day (BID) | ORAL | Status: DC
  Administered 2018-09-29 – 2018-10-03 (×10): 400 mg via ORAL
  Filled 2018-09-29 (×10): qty 2

## 2018-09-29 MED ORDER — FUROSEMIDE 10 MG/ML IJ SOLN
80.0000 mg | Freq: Once | INTRAMUSCULAR | Status: AC
  Administered 2018-09-29: 80 mg via INTRAVENOUS
  Filled 2018-09-29: qty 8

## 2018-09-29 MED ORDER — POTASSIUM CHLORIDE CRYS ER 20 MEQ PO TBCR
20.0000 meq | EXTENDED_RELEASE_TABLET | Freq: Every day | ORAL | Status: DC
  Administered 2018-09-29 – 2018-10-02 (×4): 20 meq via ORAL
  Filled 2018-09-29: qty 2
  Filled 2018-09-29 (×2): qty 1
  Filled 2018-09-29: qty 2
  Filled 2018-09-29: qty 1
  Filled 2018-09-29: qty 2

## 2018-09-29 MED ORDER — METOLAZONE 2.5 MG PO TABS
2.5000 mg | ORAL_TABLET | Freq: Once | ORAL | Status: AC
  Administered 2018-09-29: 2.5 mg via ORAL
  Filled 2018-09-29: qty 1

## 2018-09-29 NOTE — Progress Notes (Signed)
TCTS DAILY ICU PROGRESS NOTE                   [id number]            [place] [address]          [phone]   2 Days Post-Op Procedure(s) (LRB): CORONARY ARTERY BYPASS GRAFTING (CABG) x three, using left internal mammary artery and right leg greater saphenous vein harvested endoscopically (N/A) AORTIC VALVE REPLACEMENT (AVR) USING MAGNA EASE SIZE 21 MM (N/A) CLIPPING OF ATRIAL APPENDAGE USING ATRICURE FLEXV SIZE 35MM (N/A) TRANSESOPHAGEAL ECHOCARDIOGRAM (TEE) (N/A)  Total Length of Stay:  LOS: 5 days   Subjective: Awake and alert   Objective: Vital signs in last 24 hours: Temp:  [97.5 F (36.4 C)-100.2 F (37.9 C)] 98 F (36.7 C) (12/22 0400) Pulse Rate:  [65-101] 90 (12/22 0700) Cardiac Rhythm: Atrial paced (12/22 0800) Resp:  [0-23] 18 (12/22 0000) BP: (90-141)/(30-124) 110/45 (12/22 0800) SpO2:  [95 %-100 %] 99 % (12/22 0700) Arterial Line BP: (95-148)/(24-42) 148/42 (12/22 0800) Weight:  [100.4 kg] 100.4 kg (12/22 0500)  Filed Weights   09/27/18 0518 09/28/18 0400 09/29/18 0500  Weight: 94 kg 109.4 kg 100.4 kg    Weight change: -9 kg   Hemodynamic parameters for last 24 hours: PAP: (62-64)/(20-23) 62/20 CO:  [4.7 L/min] 4.7 L/min CI:  [2.4 L/min/m2] 2.4 L/min/m2  Intake/Output from previous day: 12/21 0701 - 12/22 0700 In: 1585.2 [P.O.:120; I.V.:1235.3; NG/GT:30; IV Piggyback:199.9] Out: 1270 [Urine:850; Emesis/NG output:30; Chest Tube:390]  Intake/Output this shift: Total I/O In: -  Out: 35 [Urine:35]  Current Meds: Scheduled Meds: . acetaminophen  1,000 mg Oral Q6H   Or  . acetaminophen (TYLENOL) oral liquid 160 mg/5 mL  1,000 mg Per Tube Q6H  . aspirin EC  81 mg Oral Daily   Or  . aspirin  81 mg Per Tube Daily  . bisacodyl  10 mg Oral Daily   Or  . bisacodyl  10 mg Rectal Daily  . buPROPion  300 mg Oral Daily  . Chlorhexidine Gluconate Cloth  6 each Topical Daily  . docusate sodium  200 mg Oral Daily  . enoxaparin  (LOVENOX) injection  30 mg Subcutaneous QHS  . insulin aspart  0-24 Units Subcutaneous Q4H  . insulin detemir  15 Units Subcutaneous Daily  . loratadine  10 mg Oral Daily  . mouth rinse  15 mL Mouth Rinse BID  . metoprolol tartrate  12.5 mg Oral BID   Or  . metoprolol tartrate  12.5 mg Per Tube BID  . pantoprazole  40 mg Oral Daily  . rosuvastatin  20 mg Oral Daily  . sodium chloride flush  10-40 mL Intracatheter Q12H  . sodium chloride flush  3 mL Intravenous Q12H   Continuous Infusions: . sodium chloride Stopped (09/28/18 1109)  . sodium chloride    . sodium chloride Stopped (09/27/18 2200)  . dexmedetomidine (PRECEDEX) IV infusion Stopped (09/28/18 0737)  . DOPamine 2.5 mcg/kg/min (09/29/18 0758)  . furosemide (LASIX) infusion 8 mg/hr (09/29/18 0700)  . lactated ringers    . lactated ringers    . lactated ringers 20 mL/hr at 09/29/18 0700  . milrinone 0.125 mcg/kg/min (09/29/18 0700)  . nitroGLYCERIN Stopped (09/27/18 1540)  . phenylephrine (NEO-SYNEPHRINE) Adult infusion 35 mcg/min (09/29/18 0700)   PRN Meds:.sodium chloride, lactated ringers, metoprolol tartrate, midazolam, morphine injection, ondansetron (ZOFRAN) IV, oxyCODONE, sodium chloride flush, sodium chloride flush, traMADol  General appearance: alert, cooperative, appears older  than stated age and no distress Neurologic: intact Heart: regular rate and rhythm, S1, S2 normal, no murmur, click, rub or gallop Lungs: diminished breath sounds bibasilar Abdomen: soft, non-tender; bowel sounds normal; no masses,  no organomegaly Extremities: extremities normal, atraumatic, no cyanosis or edema Wound: incisional wound vac in place functioning   Lab Results: CBC: Recent Labs    09/28/18 1627 09/28/18 1629 09/29/18 0438  WBC 19.3*  --  21.6*  HGB 11.2* 11.6* 10.7*  HCT 36.0 34.0* 35.2*  PLT 116*  --  124*   BMET:  Recent Labs    09/28/18 0402  09/28/18 1629 09/29/18 0438  NA 143  --  143 141  K 4.0  --  4.3  4.2  CL 114*  --  111 111  CO2 21*  --   --  19*  GLUCOSE 108*  --  173* 180*  BUN 12  --  16 21  CREATININE 0.66   < > 0.90 1.71*  CALCIUM 7.6*  --   --  8.1*   < > = values in this interval not displayed.    CMET: Lab Results  Component Value Date   WBC 21.6 (H) 09/29/2018   HGB 10.7 (L) 09/29/2018   HCT 35.2 (L) 09/29/2018   PLT 124 (L) 09/29/2018   GLUCOSE 180 (H) 09/29/2018   CHOL 145 05/08/2018   TRIG 152 (H) 05/08/2018   HDL 43 05/08/2018   LDLCALC 72 05/08/2018   ALT 34 09/26/2018   AST 39 09/26/2018   NA 141 09/29/2018   K 4.2 09/29/2018   CL 111 09/29/2018   CREATININE 1.71 (H) 09/29/2018   BUN 21 09/29/2018   CO2 19 (L) 09/29/2018   TSH 3.000 05/08/2018   INR 1.47 09/27/2018      PT/INR:  Recent Labs    09/27/18 1542  LABPROT 17.6*  INR 1.47   Radiology: Dg Chest Port 1 View  Result Date: 09/29/2018 CLINICAL DATA:  Postop check EXAM: PORTABLE CHEST 1 VIEW COMPARISON:  09/28/2018 FINDINGS: Swan-Ganz catheter removed. Left jugular sheath remains in place. Endotracheal tube removed. Left chest tube remains in place. Interval increase in bibasilar atelectasis and small right effusion. Progression of mild vascular congestion. No pneumothorax. IMPRESSION: Progressive bibasilar atelectasis and small right effusion. Progression of pulmonary vascular congestion. Electronically Signed   By: Franchot Gallo M.D.   On: 09/29/2018 08:31   Acute Kidney Injury (any one)  Increase in SCr by > 0.3 within 48 hours  Increase SCr to > 1.5 times baseline  Urine volume < 0.5 ml/kg/h for 6 hrs  ?Stage 1 - Increase in serum creatinine to 1.5 to 1.9 times baseline, or increase in serum creatinine by ?0.3 mg/dL (?26.5 micromol/L), or reduction in urine output to <0.5 mL/kg per hour for 6 to 12 hours.  ?Stage 2 - Increase in serum creatinine to 2.0 to 2.9 times baseline, or reduction in urine output to <0.5 mL/kg per hour for ?12 hours.  ?Stage 3 - Increase in serum creatinine  to 3.0 times baseline, or increase in serum creatinine to ?4.0 mg/dL (?353.6 micromol/L), or reduction in urine output to <0.3 mL/kg per hour for ?24 hours, or anuria for ?12 hours, or the initiation of renal replacement therapy, or, in patients <18 years, decrease in eGFR to <35 mL   Lab Results  Component Value Date   CREATININE 1.71 (H) 09/29/2018   Estimated Creatinine Clearance: 31.1 mL/min (A) (by C-G formula based on SCr of 1.71 mg/dL (  H)).  Assessment/Plan: S/P Procedure(s) (LRB): CORONARY ARTERY BYPASS GRAFTING (CABG) x three, using left internal mammary artery and right leg greater saphenous vein harvested endoscopically (N/A) AORTIC VALVE REPLACEMENT (AVR) USING MAGNA EASE SIZE 21 MM (N/A) CLIPPING OF ATRIAL APPENDAGE USING ATRICURE FLEXV SIZE 35MM (N/A) TRANSESOPHAGEAL ECHOCARDIOGRAM (TEE) (N/A) Mobilize Diuresis Diabetes control D/c left pleural tube A paced 90 Check cox Continue lasix drip dopamine and milrinone  Stage 1 aki H/h stable   Grace Isaac 09/29/2018 9:20 AM

## 2018-09-29 NOTE — Progress Notes (Signed)
Advanced Heart Failure Rounding Note  PCP-Cardiologist: Sinclair Grooms, MD   Subjective:    S/p CABG x 3 with bioprosthetic AVR and LAA clip on 12/20  Feels ok. Chest sore. Remains on dopamine 2.5 and milrinone 0.125. Neo off. Co-ox 67%  Had some AF overnight. Currently a-paced at 45. NSR 75-80 underneath with PACs  Poor response to IV lasix gtt at 8/hr.   PA systolics were 33-82NKNL before swan pulled.    [facility] 09/24/18  Ost RCA to Prox RCA lesion is 75% stenosed.  Ost Cx to Prox Cx lesion is 100% stenosed.  Dist LM lesion is 80% stenosed.  Ost 1st Diag lesion is 75% stenosed.  Ost Ramus lesion is 75% stenosed.  Mild to moderate aortic stenosis (Mean gradient 20 mm Hg) Prox LAD lesion is 50% stenosed. Hemodynamics (mmHg) RA mean 8 RV 76/1 PA 76/25 (45) PCWP 16 AO 146/55 LV 166/12 PVR 4.9 WU Cardiac Output (Fick) 5.86 Cardiac Index (Fick) 2.99  Objective:   Weight Range: 100.4 kg Body mass index is 39.21 kg/m.   Vital Signs:   Temp:  [97.5 F (36.4 C)-98.3 F (36.8 C)] 98 F (36.7 C) (12/22 0400) Pulse Rate:  [65-101] 89 (12/22 1245) Resp:  [12-18] 18 (12/22 0000) BP: (92-127)/(30-47) 119/43 (12/22 1230) SpO2:  [91 %-100 %] 96 % (12/22 1245) Arterial Line BP: (95-177)/(24-46) 177/45 (12/22 1245) Weight:  [100.4 kg] 100.4 kg (12/22 0500) Last BM Date: 09/25/18  Weight change: Filed Weights   09/27/18 0518 09/28/18 0400 09/29/18 0500  Weight: 94 kg 109.4 kg 100.4 kg    Intake/Output:   Intake/Output Summary (Last 24 hours) at 09/29/2018 1303 Last data filed at 09/29/2018 1200 Gross per 24 hour  Intake 1344.27 ml  Output 745 ml  Net 599.27 ml      Physical Exam    General:  Fatigued appearing. No resp difficulty HEENT: normal Neck: supple. JVP to jaw . Carotids 2+ bilat; no bruits. No lymphadenopathy or thryomegaly appreciated. Cor: PMI nondisplaced. Sternal dressing ok  Regular rate & rhythm. No rubs, gallops or  murmurs. Lungs: clear anteriorly with weak effort  Abdomen: soft, nontender, + distended. No hepatosplenomegaly. No bruits or masses. Good bowel sounds. Extremities: no cyanosis, clubbing, rash, 1+ edema Neuro: alert & orientedx3, cranial nerves grossly intact. moves all 4 extremities w/o difficulty. Affect pleasant    Telemetry   Apaced 90 NSR with PACs at 70-80 underneath, personally reviewed.   EKG    No new tracings.    Labs    CBC Recent Labs    09/28/18 1627 09/28/18 1629 09/29/18 0438  WBC 19.3*  --  21.6*  HGB 11.2* 11.6* 10.7*  HCT 36.0 34.0* 35.2*  MCV 91.1  --  92.6  PLT 116*  --  976*   Basic Metabolic Panel Recent Labs    09/28/18 0402 09/28/18 1627 09/28/18 1629 09/29/18 0438  NA 143  --  143 141  K 4.0  --  4.3 4.2  CL 114*  --  111 111  CO2 21*  --   --  19*  GLUCOSE 108*  --  173* 180*  BUN 12  --  16 21  CREATININE 0.66 1.13* 0.90 1.71*  CALCIUM 7.6*  --   --  8.1*  MG 2.7* 2.6*  --   --    Liver Function Tests No results for input(s): AST, ALT, ALKPHOS, BILITOT, PROT, ALBUMIN in the last 72 hours. No results for input(s): LIPASE, AMYLASE  in the last 72 hours. Cardiac Enzymes No results for input(s): CKTOTAL, CKMB, CKMBINDEX, TROPONINI in the last 72 hours.  BNP: BNP (last 3 results) No results for input(s): BNP in the last 8760 hours.  ProBNP (last 3 results) No results for input(s): PROBNP in the last 8760 hours.   D-Dimer No results for input(s): DDIMER in the last 72 hours. Hemoglobin A1C No results for input(s): HGBA1C in the last 72 hours. Fasting Lipid Panel No results for input(s): CHOL, HDL, LDLCALC, TRIG, CHOLHDL, LDLDIRECT in the last 72 hours. Thyroid Function Tests No results for input(s): TSH, T4TOTAL, T3FREE, THYROIDAB in the last 72 hours.  Invalid input(s): FREET3  Other results:   Imaging    Dg Chest Port 1 View  Result Date: 09/29/2018 CLINICAL DATA:  Postop check EXAM: PORTABLE CHEST 1 VIEW  COMPARISON:  09/28/2018 FINDINGS: Swan-Ganz catheter removed. Left jugular sheath remains in place. Endotracheal tube removed. Left chest tube remains in place. Interval increase in bibasilar atelectasis and small right effusion. Progression of mild vascular congestion. No pneumothorax. IMPRESSION: Progressive bibasilar atelectasis and small right effusion. Progression of pulmonary vascular congestion. Electronically Signed   By: Franchot Gallo M.D.   On: 09/29/2018 08:31     Medications:     Scheduled Medications: . acetaminophen  1,000 mg Oral Q6H   Or  . acetaminophen (TYLENOL) oral liquid 160 mg/5 mL  1,000 mg Per Tube Q6H  . aspirin EC  81 mg Oral Daily   Or  . aspirin  81 mg Per Tube Daily  . bisacodyl  10 mg Oral Daily   Or  . bisacodyl  10 mg Rectal Daily  . buPROPion  300 mg Oral Daily  . Chlorhexidine Gluconate Cloth  6 each Topical Daily  . docusate sodium  200 mg Oral Daily  . enoxaparin (LOVENOX) injection  30 mg Subcutaneous QHS  . insulin aspart  0-24 Units Subcutaneous Q4H  . insulin detemir  15 Units Subcutaneous Daily  . loratadine  10 mg Oral Daily  . mouth rinse  15 mL Mouth Rinse BID  . metoprolol tartrate  12.5 mg Oral BID   Or  . metoprolol tartrate  12.5 mg Per Tube BID  . pantoprazole  40 mg Oral Daily  . rosuvastatin  20 mg Oral Daily  . sodium chloride flush  10-40 mL Intracatheter Q12H  . sodium chloride flush  3 mL Intravenous Q12H    Infusions: . sodium chloride Stopped (09/28/18 1109)  . sodium chloride    . sodium chloride Stopped (09/27/18 2200)  . dexmedetomidine (PRECEDEX) IV infusion Stopped (09/28/18 0737)  . DOPamine 2.5 mcg/kg/min (09/29/18 1200)  . furosemide (LASIX) infusion 8 mg/hr (09/29/18 1200)  . lactated ringers    . lactated ringers    . lactated ringers 20 mL/hr at 09/29/18 1200  . milrinone 0.125 mcg/kg/min (09/29/18 1200)  . nitroGLYCERIN Stopped (09/27/18 1540)  . phenylephrine (NEO-SYNEPHRINE) Adult infusion 10 mcg/min  (09/29/18 1200)    PRN Medications: sodium chloride, lactated ringers, metoprolol tartrate, midazolam, morphine injection, ondansetron (ZOFRAN) IV, oxyCODONE, sodium chloride flush, sodium chloride flush, traMADol    Patient Profile   Doris Jefferson is a 77 y.o. female with Chronic diastolic CHF, HTN, CAD, PAF, h/o OSA on CPAP, DM 2, HLD, moderate AS, and obesity.   Asked to see by [facility] for optimization prior to CABG.  Assessment/Plan   1. Severe 3v CAD - Severe with 75% RCA, 100% Cx, 80% LM and LAD 50% stenosed -  S/p CABG x 3 with bioprosthetic AVR and LAA clip on 12/20 - Now POD#2  - Swan out. On Dopa 2.5, milrinone 0.125. Neo off. SBP 140-150s.  PA systolics were 01-31YHOO before swan pulled.  - Weight up 20+ pounds with poor response to lasix gtt. Will give lasix 80 IV + metolazone 2.5 x1. Increase lasix gtt to 12 - Likely cans top dopamine in am   2. Moderate AS - s/p bioprosthetic AVR - wide pulse pressure on exam - slightly improved today. Will follow. - will need echo prior to d/c   3. Chronic diastolic CHF - Echo 87/5/79 LVEF 55-60%, Grade 2 DD, Mild/Mod AS, Mod MR, mild LAE, RV mild dilated, Mild TR, PA peak pressure 79 mm Hg.  - Volume status elevated post CABG - increasing lasix gtt  4. 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 Suspect unlikely to benefit from pulmonary vasodilators.  - PA systolics were 72-82SUOR before swan pulled. Continue milrinone. Start lasix gtt  4. PAF - Brief AF overnight. Remains in NSR by Tele - Restart po amio at 400 bid - Resume AC when ok with surgery - I left her with a pacing today but if in NSR tomorrow will stop pacing  6. OSA on CPAP - Continue CPAP.    7. DM 2 - Per TCTS  Length of Stay: [place], MD  09/29/2018, 1:03 PM  Advanced Heart Failure Team Pager [phone]  (M-F; 7a - 4p)  Please contact [facility] for night-coverage after hours (4p -7a ) and weekends on [email or web address]

## 2018-09-29 NOTE — Op Note (Signed)
NAME: Doris Jefferson, Doris Jefferson MEDICAL RECORD [id number] ACCOUNT [id number] DATE OF BIRTH:05-29-1941 FACILITY: [facility] LOCATION: [facility] PHYSICIAN:Marsel Gail BServando Snare, MD  OPERATIVE REPORT  DATE OF PROCEDURE:  09/27/2018  PREOPERATIVE DIAGNOSIS:  Critical left main coronary artery disease with ostial right moderate aortic stenosis, pulmonary hypertension.  POSTOPERATIVE DIAGNOSIS:  Critical left main coronary artery disease with ostial right moderate aortic stenosis, pulmonary hypertension.  SURGICAL PROCEDURE:  Coronary artery bypass grafting x3 with the left internal mammary to the left anterior descending coronary artery, reverse saphenous vein graft to the obtuse marginal coronary artery, reverse saphenous vein graft to the right  coronary artery with right thigh greater saphenous endoscopic vein harvesting and aortic valve replacement with pericardial tissue valve, [employer] model #3300TFX 21 mm, serial #[id number] and placement of AtriCure left atrial clip 35 mm.  SURGEON:  Lanelle Bal, MD  FIRST ASSISTANT:  Ellwood Handler, PA-C  BRIEF HISTORY:  The patient is a 77 year old female with increasing episodes of fatigue and shortness of breath.  She was evaluated once in the emergency room in the summer of 2019 for chest pain.  Recent echocardiogram done in evaluation of her sleep  apnea revealed worsening aortic stenosis graded at least moderate.  Because of these changes, she was admitted for elective cardiac catheterization.  At the time of cardiac catheterization, she was noted to have significant pulmonary hypertension and 75%  ostial right coronary lesion and a distal left main lesion of 80-90% and total occlusion of the circumflex.  The patient post-catheterization was started on milrinone infusion evaluated by the heart failure team in review of her cardiac catheterization.   We discussed with her proceeding with coronary artery bypass grafting and with at least moderate aortic  stenosis, mean gradient by echo of 25-26 mm replacement of her aortic valve with a tissue valve.  Risks and options of surgery were discussed with  the patient and her husband in detail and she was willing to proceed.  DESCRIPTION OF PROCEDURE:  With Swan-Ganz and arterial line monitors in place, the patient underwent general endotracheal anesthesia.  Anesthesia had some trouble placing her lines.  They were unable to pass the Swan from the right IJ and ultimately was  placed from the left IJ.  The patient remained stable after general anesthesia.  A TEE probe was placed by Dr. Nyoka Cowden.  This confirmed some thickening of the mitral valve with mild, not more than moderate mitral regurgitation, moderate aortic stenosis.   Overall, systolic function was preserved with significant thickening of the myocardium.  The skin of the chest and legs was prepped with Betadine, draped in the usual sterile manner.  Appropriate timeout was performed and we proceeded with endoscopic  vein harvesting of the right greater saphenous vein.  The vein was of adequate quality and caliber.  Median sternotomy was performed.  Left internal mammary artery was dissected down as a pedicle graft.  The distal artery was divided and had good free  flow.  The vessel was hydrostatically dilated with heparinized saline.  Pericardium was opened.  The patient did have evidence of biventricular hypertrophy and she was systemically heparinized.  The ascending aorta was cannulated.  The right atrium was  cannulated with a dual stage venous cannula.  Retrograde cardioplegia catheter was placed into the coronary sinus.  The patient was placed on cardiopulmonary bypass at 2.4 liters per minute per meter square.  A right superior pulmonary vein vent was  placed.  The patient's body temperature was cooled to 32 degrees.  Sites of anastomosis were selected and dissected down to the epicardium.  The obtuse marginal vessel, which had not been evident on the  cath films had a reasonable sized distal vessel to  bypass.  Aortic crossclamp was applied and 500 mL of cold blood potassium cardioplegia was administered antegrade with diastolic arrest of the heart and myocardial septal temperatures monitored throughout the crossclamp.  We first turned our attention to  the coronary anastomoses.  The distal right coronary artery was opened and was of good quality and easily passed a 1.5 mm probe distally.  Using a running 7-0 Prolene a distal anastomosis with a segment of reverse saphenous vein graft was carried out.   The heart was then elevated and the obtuse marginal, which was partially intramyocardial, was opened and admitted a 1.5 mm probe.  Using a running 7-0 Prolene, distal anastomosis was performed with a segment of reverse saphenous vein graft.  We then  turned our attention to the left anterior descending coronary artery, which was partially intramyocardial.  The vessel was dissected out and opened and was approximately 1.5 mm in size.  Using a running 8-0 Prolene, the left internal mammary artery was  anastomosed to left anterior descending coronary artery.  We had entertained bipolar ablation of the pulmonary veins.  However, with the patient's significant obesity and deep chest, we decided to only place an atrial clip.  A 35 mm clip was easily  applied to the base of the atrial appendage, which was relatively small.  With the cross clamp still in place and intermittent plegia down the vein grafts and retrograde, we then performed a transverse aortotomy and opened the aorta.  This gave good  exposure of a trileaflet aortic valve that had some thickening and consistent with the echo findings appeared to have moderate stenosis.  The valve was excised.  The annulus was sized for a 21 pericardial tissue valve.  Twelve #2 Ti-Cron pledgeted  sutures with the pledgets on the ventricular surface were placed circumferentially.  The valve was then seated in place and  sutures tied.  Care was taken to make sure there was no obstruction of the right or left coronary ostium.  The valve seated well.   All loose calcific debris had been removed.  The aortotomy was then closed with horizontal mattress 4-0 Prolene suture over felt strips.  A second layer of running 4-0 Prolene was used as secondary closure.  Two punch aortotomies were performed and each  of the 2 vein grafts were anastomosed to the ascending aorta.  With the patient's known pulmonary hypertension, nitric oxide was added to the ventilation circuit.  The patient had been on milrinone.  Low dose dopamine was started.  The heart was allowed  to passively fill and deair.  Then, 150 mL of warm retrograde cardioplegia was administered as the heart was de-aired.  Proximal anastomoses were completed and the bulldog on the mammary artery was removed with prompt rise in myocardial septal  temperature.  Aortic crossclamp was removed with a total crossclamp time of 149 minutes.  The patient spontaneously converted to a slow sinus rhythm.  Sites of anastomosis were inspected and were free of bleeding.  The patient's body temperature rewarmed  to 37 degrees.  TEE showed good positioning of the aortic valve prosthesis without evidence of perivalvular leak or central leak.  The right superior pulmonary vein vent was removed.  With the patient's body temperature rewarmed to 37 degrees, she was  then ventilated and  weaned from cardiopulmonary bypass without difficulty.  Atrial and ventricular pacing wires had been applied and the patient was atrially paced to increased rate.  TEE showed decrease degree of mitral regurgitation that was consistent  with mild.  The aortic valve prosthesis was functioning well without perivalvular leak.  The patient was decannulated in the usual fashion.  Protamine sulfate was administered with operative field hemostatic.  A left pleural tube and a Blake mediastinal  drain were left in place.   Pericardium was loosely reapproximated.  Graft markers were applied.  A left pleural tube and a Blake mediastinal drain were left in place.  The sternum was closed with #6 stainless steel wire.  Fascia closed with interrupted  0 Vicryl, running 3-0 Vicryl, subcutaneous tissue, 3-0 subcuticular stitch in skin edges.  An incisional wound VAC was placed due to the patient's diabetes and significant obesity.  The patient did require 2 units of packed red blood cells while on  bypass because of low hematocrit preoperatively and while on bypass.  Total pump time was 190 minutes.  The patient was transferred to the surgical intensive care unit for further postoperative care.  Sponge and needle count was reported as correct.  RF  scanning showed clear code.  At the completion of the procedure, the patient's PA pressures had reduced to close to one-third systemic.  At the beginning of the procedure and in the cath lab, it had been 75% of systemic pressure.  TN/NUANCE  D:09/28/2018 T:09/29/2018 JOB:[id number]

## 2018-09-29 NOTE — Progress Notes (Signed)
EVENING ROUNDS NOTE :     [address]       ,[place] [address]             [phone]                 2 Days Post-Op Procedure(s) (LRB): CORONARY ARTERY BYPASS GRAFTING (CABG) x three, using left internal mammary artery and right leg greater saphenous vein harvested endoscopically (N/A) AORTIC VALVE REPLACEMENT (AVR) USING MAGNA EASE SIZE 21 MM (N/A) CLIPPING OF ATRIAL APPENDAGE USING ATRICURE FLEXV SIZE 35MM (N/A) TRANSESOPHAGEAL ECHOCARDIOGRAM (TEE) (N/A)  Total Length of Stay:  LOS: 5 days  BP (!) 132/45   Pulse 89   Temp 98.4 F (36.9 C) (Oral)   Resp 18   Ht 5\' 3"  (1.6 m)   Wt 100.4 kg   SpO2 99%   BMI 39.21 kg/m   .Intake/Output      12/21 0701 - 12/22 0700 12/22 0701 - 12/23 0700   P.O. 120    I.V. (mL/kg) 1235.3 (12.3) 514.5 (5.1)   Blood     NG/GT 30    IV Piggyback 199.9    Total Intake(mL/kg) 1585.2 (15.8) 514.5 (5.1)   Urine (mL/kg/hr) 850 (0.4) 410 (0.4)   Emesis/NG output 30    Drains 0 0   Blood     Chest Tube 390 60   Total Output 1270 470   Net +315.2 +44.5          . sodium chloride Stopped (09/28/18 1109)  . sodium chloride    . sodium chloride Stopped (09/27/18 2200)  . dexmedetomidine (PRECEDEX) IV infusion Stopped (09/28/18 0737)  . DOPamine 2.5 mcg/kg/min (09/29/18 1800)  . furosemide (LASIX) infusion 12 mg/hr (09/29/18 1819)  . lactated ringers    . lactated ringers    . lactated ringers 20 mL/hr at 09/29/18 1800  . milrinone 0.125 mcg/kg/min (09/29/18 1800)  . nitroGLYCERIN Stopped (09/27/18 1540)  . phenylephrine (NEO-SYNEPHRINE) Adult infusion Stopped (09/29/18 1552)     Lab Results  Component Value Date   WBC 21.6 (H) 09/29/2018   HGB 10.7 (L) 09/29/2018   HCT 35.2 (L) 09/29/2018   PLT 124 (L) 09/29/2018   GLUCOSE 180 (H) 09/29/2018   CHOL 145 05/08/2018   TRIG 152 (H) 05/08/2018   HDL 43 05/08/2018   LDLCALC 72 05/08/2018   ALT 34 09/26/2018   AST 39 09/26/2018   NA 141 09/29/2018   K 4.2  09/29/2018   CL 111 09/29/2018   CREATININE 1.71 (H) 09/29/2018   BUN 21 09/29/2018   CO2 19 (L) 09/29/2018   TSH 3.000 05/08/2018   INR 1.47 09/27/2018   Up to chair , working on respiratory effort , is [facility] this am 67, uop picking up   Grace Isaac MD  Beeper [phone] Office [phone] 09/29/2018 6:19 PM

## 2018-09-30 ENCOUNTER — Encounter (HOSPITAL_COMMUNITY): Payer: Self-pay | Admitting: Cardiothoracic Surgery

## 2018-09-30 ENCOUNTER — Inpatient Hospital Stay (HOSPITAL_COMMUNITY): Payer: Medicare Other

## 2018-09-30 LAB — TYPE AND SCREEN
ABO/RH(D): A POS
Antibody Screen: NEGATIVE
Unit division: 0
Unit division: 0
Unit division: 0
Unit division: 0

## 2018-09-30 LAB — COOXEMETRY PANEL
Carboxyhemoglobin: 1.4 % (ref 0.5–1.5)
Methemoglobin: 1.4 % (ref 0.0–1.5)
O2 Saturation: 64.7 %
Total hemoglobin: 10.6 g/dL — ABNORMAL LOW (ref 12.0–16.0)

## 2018-09-30 LAB — COMPREHENSIVE METABOLIC PANEL
ALT: 53 U/L — ABNORMAL HIGH (ref 0–44)
AST: 30 U/L (ref 15–41)
Albumin: 2.4 g/dL — ABNORMAL LOW (ref 3.5–5.0)
Alkaline Phosphatase: 58 U/L (ref 38–126)
Anion gap: 12 (ref 5–15)
BUN: 30 mg/dL — ABNORMAL HIGH (ref 8–23)
CO2: 20 mmol/L — ABNORMAL LOW (ref 22–32)
Calcium: 8.1 mg/dL — ABNORMAL LOW (ref 8.9–10.3)
Chloride: 108 mmol/L (ref 98–111)
Creatinine, Ser: 1.91 mg/dL — ABNORMAL HIGH (ref 0.44–1.00)
GFR calc Af Amer: 29 mL/min — ABNORMAL LOW (ref >60)
GFR calc non Af Amer: 25 mL/min — ABNORMAL LOW (ref >60)
Glucose, Bld: 131 mg/dL — ABNORMAL HIGH (ref 70–99)
Potassium: 4.2 mmol/L (ref 3.5–5.1)
Sodium: 140 mmol/L (ref 135–145)
Total Bilirubin: 0.7 mg/dL (ref 0.3–1.2)
Total Protein: 5 g/dL — ABNORMAL LOW (ref 6.5–8.1)

## 2018-09-30 LAB — BPAM RBC
Blood Product Expiration Date: 201912292359
Blood Product Expiration Date: 202001022359
Blood Product Expiration Date: 202001042359
Blood Product Expiration Date: 202001042359
ISSUE DATE / TIME: 201912161102
ISSUE DATE / TIME: 201912200756
ISSUE DATE / TIME: 201912200756
Unit Type and Rh: 6200
Unit Type and Rh: 6200
Unit Type and Rh: 6200
Unit Type and Rh: 6200

## 2018-09-30 LAB — CBC
HCT: 33.1 % — ABNORMAL LOW (ref 36.0–46.0)
Hemoglobin: 10.4 g/dL — ABNORMAL LOW (ref 12.0–15.0)
MCH: 29.1 pg (ref 26.0–34.0)
MCHC: 31.4 g/dL (ref 30.0–36.0)
MCV: 92.5 fL (ref 80.0–100.0)
Platelets: 90 10*3/uL — ABNORMAL LOW (ref 150–400)
RBC: 3.58 MIL/uL — ABNORMAL LOW (ref 3.87–5.11)
RDW: 15.7 % — ABNORMAL HIGH (ref 11.5–15.5)
WBC: 14.8 10*3/uL — ABNORMAL HIGH (ref 4.0–10.5)
nRBC: 0 % (ref 0.0–0.2)

## 2018-09-30 LAB — GLUCOSE, CAPILLARY
GLUCOSE-CAPILLARY: 151 mg/dL — AB (ref 70–99)
Glucose-Capillary: 111 mg/dL — ABNORMAL HIGH (ref 70–99)
Glucose-Capillary: 132 mg/dL — ABNORMAL HIGH (ref 70–99)
Glucose-Capillary: 150 mg/dL — ABNORMAL HIGH (ref 70–99)
Glucose-Capillary: 175 mg/dL — ABNORMAL HIGH (ref 70–99)
Glucose-Capillary: 179 mg/dL — ABNORMAL HIGH (ref 70–99)

## 2018-09-30 MED ORDER — SODIUM CHLORIDE 0.9% FLUSH
10.0000 mL | INTRAVENOUS | Status: DC | PRN
  Administered 2018-10-04 – 2018-10-08 (×2): 10 mL
  Filled 2018-09-30 (×2): qty 40

## 2018-09-30 MED ORDER — SODIUM CHLORIDE 0.9% FLUSH
10.0000 mL | Freq: Two times a day (BID) | INTRAVENOUS | Status: DC
  Administered 2018-09-30 – 2018-10-02 (×5): 10 mL
  Administered 2018-10-03: 30 mL
  Administered 2018-10-03 – 2018-10-04 (×3): 10 mL
  Administered 2018-10-05: 20 mL
  Administered 2018-10-05: 10 mL
  Administered 2018-10-06: 30 mL
  Administered 2018-10-07 – 2018-10-08 (×3): 10 mL
  Administered 2018-10-08: 30 mL

## 2018-09-30 MED ORDER — CHLORHEXIDINE GLUCONATE CLOTH 2 % EX PADS
6.0000 | MEDICATED_PAD | Freq: Every day | CUTANEOUS | Status: DC
  Administered 2018-09-30 – 2018-10-06 (×6): 6 via TOPICAL

## 2018-09-30 NOTE — Progress Notes (Signed)
Peripherally Inserted Central Catheter/Midline Placement  The IV Nurse has discussed with the patient and/or persons authorized to consent for the patient, the purpose of this procedure and the potential benefits and risks involved with this procedure.  The benefits include less needle sticks, lab draws from the catheter, and the patient may be discharged home with the catheter. Risks include, but not limited to, infection, bleeding, blood clot (thrombus formation), and puncture of an artery; nerve damage and irregular heartbeat and possibility to perform a PICC exchange if needed/ordered by physician.  Alternatives to this procedure were also discussed.  Bard Power PICC patient education guide, fact sheet on infection prevention and patient information card has been provided to patient /or left at bedside.    PICC/Midline Placement Documentation  PICC Triple Lumen 09/30/18 PICC Right Brachial 35 cm 0 cm (Active)  Indication for Insertion or Continuance of Line Prolonged intravenous therapies 09/30/2018  9:34 AM  Exposed Catheter (cm) 0 cm 09/30/2018  9:34 AM  Site Assessment Clean;Dry;Intact 09/30/2018  9:34 AM  Lumen #1 Status Flushed;Blood return noted 09/30/2018  9:34 AM  Lumen #2 Status Flushed;Blood return noted 09/30/2018  9:34 AM  Lumen #3 Status Flushed;Blood return noted 09/30/2018  9:34 AM  Dressing Type Transparent 09/30/2018  9:34 AM  Dressing Status Clean;Dry;Intact;Antimicrobial disc in place 09/30/2018  9:34 AM  Dressing Intervention New dressing 09/30/2018  9:34 AM  Dressing Change Due 10/07/18 09/30/2018  9:34 AM       Christella Noa Albarece 09/30/2018, 9:35 AM

## 2018-09-30 NOTE — Evaluation (Signed)
Physical Therapy Evaluation Patient Details Name: Doris Jefferson MRN: [id number] DOB: 1941-02-06 Today's Date: 09/30/2018   History of Present Illness  patient is a 77 y.o. female with Chronic diastolic CHF, HTN, CAD, PAF, h/o OSA on CPAP, DM 2, HLD, moderate AS, and obesity. S/p CABG x 3 with bioprosthetic AVR and LAA clip on 12/20  Clinical Impression  Orders received for PT evaluation. Patient demonstrates deficits in functional mobility as indicated below. Will benefit from continued skilled PT to address deficits and maximize function. At this time, patient required significant assist to attempt mobility. Patient with increased dizziness and elevated HR with attempt to mobilize. Further mobility deferred. Nsg notfied. Will continue to see and progress as tolerated. Recommend SNF.     Follow Up Recommendations SNF;Supervision for mobility/OOB    Equipment Recommendations  (rollator)    Recommendations for Other Services       Precautions / Restrictions Precautions Precautions: Fall Precaution Comments: sternal; wound vac in place Restrictions Weight Bearing Restrictions: Yes(Front wheel walker) RUE Weight Bearing: Partial weight bearing RUE Partial Weight Bearing Percentage or Pounds: 5 pounds      Mobility  Bed Mobility Overal bed mobility: Needs Assistance Bed Mobility: Supine to Sit;Sit to Supine     Supine to sit: Mod assist;+2 for physical assistance Sit to supine: Mod assist;+2 for physical assistance   General bed mobility comments: Increased time and effort, assist to elevate trunk to upright and rotate hips to EOB, assist to elevate LEs and control trunk upon return to supine  Transfers Overall transfer level: Needs assistance Equipment used: Ambulation equipment used(eva) Transfers: Sit to/from Stand Sit to Stand: Mod assist         General transfer comment: moderate assist for stability and power up with momentum, VCs for hand placement and  precautions  Ambulation/Gait Ambulation/Gait assistance: Max assist Gait Distance (Feet): 3 Feet Assistive device: (eva walker) Gait Pattern/deviations: Step-to pattern;Shuffle;Wide base of support Gait velocity: decreased Gait velocity interpretation: <1.31 ft/sec, indicative of household ambulator General Gait Details: patient attempted 3 steps, became significantly symptomatic and dizzy with activity, HR elevate 120s. Assisted back to bed  Stairs            Wheelchair Mobility    Modified Rankin (Stroke Patients Only)       Balance Overall balance assessment: Needs assistance Sitting-balance support: Feet supported Sitting balance-Leahy Scale: Poor Sitting balance - Comments: hands on assist for sitting at EOB   Standing balance support: During functional activity;Bilateral upper extremity supported Standing balance-Leahy Scale: Poor                               Pertinent Vitals/Pain Pain Assessment: Faces Faces Pain Scale: Hurts even more Pain Location: chest incision and LEs Pain Descriptors / Indicators: Aching;Discomfort;Guarding;Grimacing;Sore Pain Intervention(s): Monitored during session    Home Living Family/patient expects to be discharged to:: Private residence Living Arrangements: Spouse/significant other Available Help at Discharge: Family;Available 24 hours/day Type of Home: House Home Access: Stairs to enter   [employer] of Steps: 3 Home Layout: One level        Prior Function Level of Independence: Independent               Hand Dominance   Dominant Hand: Right    Extremity/Trunk Assessment   Upper Extremity Assessment Upper Extremity Assessment: Generalized weakness    Lower Extremity Assessment Lower Extremity Assessment: Generalized weakness  Communication   Communication: No difficulties  Cognition Arousal/Alertness: Awake/alert Behavior During Therapy: Flat affect Overall Cognitive  Status: Impaired/Different from baseline Area of Impairment: Orientation;Attention;Memory;Awareness;Safety/judgement;Following commands;Problem solving                 Orientation Level: Disoriented to;Time Current Attention Level: Sustained Memory: Decreased short-term memory;Decreased recall of precautions Following Commands: Follows one step commands inconsistently;Follows multi-step commands inconsistently   Awareness: Emergent Problem Solving: Slow processing;Decreased initiation;Difficulty sequencing;Requires verbal cues;Requires tactile cues        General Comments      Exercises     Assessment/Plan    PT Assessment Patient needs continued PT services  PT Problem List Decreased strength;Decreased activity tolerance;Decreased balance;Decreased mobility;Cardiopulmonary status limiting activity       PT Treatment Interventions DME instruction;Gait training;Stair training;Functional mobility training;Therapeutic activities;Therapeutic exercise;Patient/family education    PT Goals (Current goals can be found in the Care Plan section)  Acute Rehab PT Goals Patient Stated Goal: to get better PT Goal Formulation: With patient/family Time For Goal Achievement: 10/14/18 Potential to Achieve Goals: Good    Frequency Min 2X/week   Barriers to discharge        Co-evaluation               AM-PAC PT "6 Clicks" Mobility  Outcome Measure Help needed turning from your back to your side while in a flat bed without using bedrails?: A Lot Help needed moving from lying on your back to sitting on the side of a flat bed without using bedrails?: A Lot Help needed moving to and from a bed to a chair (including a wheelchair)?: A Lot Help needed standing up from a chair using your arms (e.g., wheelchair or bedside chair)?: A Lot Help needed to walk in hospital room?: A Lot Help needed climbing 3-5 steps with a railing? : A Lot 6 Click Score: 12    End of Session  Equipment Utilized During Treatment: Oxygen Activity Tolerance: Treatment limited secondary to medical complications (Comment) Patient left: in bed;with call bell/phone within reach;with family/visitor present Nurse Communication: Mobility status PT Visit Diagnosis: Difficulty in walking, not elsewhere classified (R26.2);Muscle weakness (generalized) (M62.81)    Time: 7121-9758 PT Time Calculation (min) (ACUTE ONLY): 24 min   Charges:   PT Evaluation $PT Eval Moderate Complexity: 1 Mod PT Treatments $Therapeutic Activity: 8-22 mins        Alben Deeds, PT DPT  Board Certified Neurologic Specialist Acute Rehabilitation Services Pager [phone] Office [phone]   Duncan Dull 09/30/2018, 4:01 PM

## 2018-09-30 NOTE — Progress Notes (Signed)
Patient ID: Doris Jefferson, female   DOB: 1941/08/11, 77 y.o.   MRN: [id number] TCTS Evening Rounds:  Hemodynamically stable on dop 2.5, milrinone 0.125 CVP 7 Diuresing on lasix drip 12. Adequate urine output but not large diuresis for this dose of lasix. Creat this am up to 1.9.  Sitting up in chair.  Continue present course.

## 2018-09-30 NOTE — Progress Notes (Signed)
TCTS DAILY ICU PROGRESS NOTE                   [address]            [place] [address]          [phone]   3 Days Post-Op Procedure(s) (LRB): CORONARY ARTERY BYPASS GRAFTING (CABG) x three, using left internal mammary artery and right leg greater saphenous vein harvested endoscopically (N/A) AORTIC VALVE REPLACEMENT (AVR) USING MAGNA EASE SIZE 21 MM (N/A) CLIPPING OF ATRIAL APPENDAGE USING ATRICURE FLEXV SIZE 35MM (N/A) TRANSESOPHAGEAL ECHOCARDIOGRAM (TEE) (N/A)  Total Length of Stay:  LOS: 6 days   Subjective: Up to chair, awake and alert,  Objective: Vital signs in last 24 hours: Temp:  [97.9 F (36.6 C)-99 F (37.2 C)] 98.4 F (36.9 C) (12/23 0400) Pulse Rate:  [69-90] 77 (12/23 0609) Cardiac Rhythm: Normal sinus rhythm (12/23 0400) Resp:  [18] 18 (12/23 0000) BP: (100-137)/(29-100) 133/40 (12/23 0609) SpO2:  [91 %-100 %] 92 % (12/23 0609) Arterial Line BP: (109-177)/(22-46) 136/34 (12/23 0500) Weight:  [100.9 kg] 100.9 kg (12/23 0500)  Filed Weights   09/28/18 0400 09/29/18 0500 09/30/18 0500  Weight: 109.4 kg 100.4 kg 100.9 kg    Weight change: 0.5 kg   Hemodynamic parameters for last 24 hours:    Intake/Output from previous day: 12/22 0701 - 12/23 0700 In: 901.4 [I.V.:901.4] Out: 1185 [Urine:1125; Chest Tube:60]  Intake/Output this shift: No intake/output data recorded.  Current Meds: Scheduled Meds: . acetaminophen  1,000 mg Oral Q6H   Or  . acetaminophen (TYLENOL) oral liquid 160 mg/5 mL  1,000 mg Per Tube Q6H  . amiodarone  400 mg Oral BID  . aspirin EC  81 mg Oral Daily   Or  . aspirin  81 mg Per Tube Daily  . bisacodyl  10 mg Oral Daily   Or  . bisacodyl  10 mg Rectal Daily  . buPROPion  300 mg Oral Daily  . Chlorhexidine Gluconate Cloth  6 each Topical Daily  . docusate sodium  200 mg Oral Daily  . enoxaparin (LOVENOX) injection  30 mg Subcutaneous QHS  . insulin aspart  0-24 Units Subcutaneous Q4H  . insulin  detemir  15 Units Subcutaneous Daily  . loratadine  10 mg Oral Daily  . mouth rinse  15 mL Mouth Rinse BID  . metoprolol tartrate  12.5 mg Oral BID   Or  . metoprolol tartrate  12.5 mg Per Tube BID  . pantoprazole  40 mg Oral Daily  . potassium chloride SA  20 mEq Oral Daily  . rosuvastatin  20 mg Oral Daily  . sodium chloride flush  10-40 mL Intracatheter Q12H  . sodium chloride flush  3 mL Intravenous Q12H   Continuous Infusions: . sodium chloride Stopped (09/28/18 1109)  . sodium chloride    . sodium chloride Stopped (09/27/18 2200)  . dexmedetomidine (PRECEDEX) IV infusion Stopped (09/28/18 0737)  . DOPamine 2.5 mcg/kg/min (09/30/18 0600)  . furosemide (LASIX) infusion 12 mg/hr (09/30/18 0600)  . lactated ringers    . lactated ringers    . lactated ringers 10 mL/hr at 09/30/18 0600  . milrinone 0.125 mcg/kg/min (09/30/18 0600)  . nitroGLYCERIN Stopped (09/27/18 1540)  . phenylephrine (NEO-SYNEPHRINE) Adult infusion Stopped (09/29/18 1552)   PRN Meds:.sodium chloride, lactated ringers, metoprolol tartrate, midazolam, morphine injection, ondansetron (ZOFRAN) IV, oxyCODONE, sodium chloride flush, sodium chloride flush, traMADol  General appearance: alert, cooperative and no distress Neurologic: intact  Heart: regular rate and rhythm, S1, S2 normal, no murmur, click, rub or gallop Lungs: diminished breath sounds bibasilar Abdomen: soft, non-tender; bowel sounds normal; no masses,  no organomegaly Extremities: extremities normal, atraumatic, no cyanosis or edema and Homans sign is negative, no sign of DVT Wound: wound vac in place  Lab Results: CBC: Recent Labs    09/29/18 0438 09/30/18 0533  WBC 21.6* 14.8*  HGB 10.7* 10.4*  HCT 35.2* 33.1*  PLT 124* 90*   BMET:  Recent Labs    09/29/18 0438 09/30/18 0533  NA 141 140  K 4.2 4.2  CL 111 108  CO2 19* 20*  GLUCOSE 180* 131*  BUN 21 30*  CREATININE 1.71* 1.91*  CALCIUM 8.1* 8.1*    CMET: Lab Results    Component Value Date   WBC 14.8 (H) 09/30/2018   HGB 10.4 (L) 09/30/2018   HCT 33.1 (L) 09/30/2018   PLT 90 (L) 09/30/2018   GLUCOSE 131 (H) 09/30/2018   CHOL 145 05/08/2018   TRIG 152 (H) 05/08/2018   HDL 43 05/08/2018   LDLCALC 72 05/08/2018   ALT 53 (H) 09/30/2018   AST 30 09/30/2018   NA 140 09/30/2018   K 4.2 09/30/2018   CL 108 09/30/2018   CREATININE 1.91 (H) 09/30/2018   BUN 30 (H) 09/30/2018   CO2 20 (L) 09/30/2018   TSH 3.000 05/08/2018   INR 1.47 09/27/2018      PT/INR:  Recent Labs    09/27/18 1542  LABPROT 17.6*  INR 1.47   Radiology: Dg Chest Port 1 View  Result Date: 09/30/2018 CLINICAL DATA:  Chest pain. Status post coronary artery bypass grafting EXAM: PORTABLE CHEST 1 VIEW COMPARISON:  September 29, 2018 FINDINGS: There is airspace consolidation in the left mid lung. There is mild right midlung atelectasis. There is a small left pleural effusion. Lungs elsewhere are clear. There is cardiomegaly with mild pulmonary venous hypertension. No adenopathy. Cordis tip is in the left innominate vein region. No pneumothorax. Patient is status post coronary artery bypass grafting and valve replacement. There is a left atrial appendage clamp. IMPRESSION: Consolidation left mid lung. Small left pleural effusion. Pulmonary vascular congestion present. Stable postoperative changes. No pneumothorax. Electronically Signed   By: Lowella Grip III M.D.   On: 09/30/2018 07:22   [place] Ekg Site Rite  Result Date: 09/29/2018 If Site Rite image not attached, placement could not be confirmed due to current cardiac rhythm.    Assessment/Plan: S/P Procedure(s) (LRB): CORONARY ARTERY BYPASS GRAFTING (CABG) x three, using left internal mammary artery and right leg greater saphenous vein harvested endoscopically (N/A) AORTIC VALVE REPLACEMENT (AVR) USING MAGNA EASE SIZE 21 MM (N/A) CLIPPING OF ATRIAL APPENDAGE USING ATRICURE FLEXV SIZE 35MM (N/A) TRANSESOPHAGEAL ECHOCARDIOGRAM  (TEE) (N/A) Mobilize Diuresis Diabetes control Urine output increased to more than a liter 24 hours, creatinine up slightly more, continue dopamine and Lasix drip Will need extensive PT and OT to mobilize postop Monitor plt count  pic line ordered yesterday , to be placed  Doris Jefferson 09/30/2018 7:35 AM

## 2018-09-30 NOTE — Progress Notes (Addendum)
Advanced Heart Failure Rounding Note  PCP-Cardiologist: Sinclair Grooms, MD   Subjective:    S/p CABG x 3 with bioprosthetic AVR and LAA clip on 12/20  Remains on dopamine 2.5 and milrinone 0.125. Neo off. Co-ox 65%  PO amio restarted yesterday. Remains in NSR. No longer pacing, but still has epicardial wires.  Lasix drip increased to 12 mg/hr yesterday + metolazone + 80 mg IV lasix. Weight up 1 lb. I/O only net negative 280 mls. Creatinine 1.7 > 1.9. PICC line to be placed this am.   PA systolics were 40-34 mmHG before swan pulled.    [facility] 09/24/18  Ost RCA to Prox RCA lesion is 75% stenosed.  Ost Cx to Prox Cx lesion is 100% stenosed.  Dist LM lesion is 80% stenosed.  Ost 1st Diag lesion is 75% stenosed.  Ost Ramus lesion is 75% stenosed.  Mild to moderate aortic stenosis (Mean gradient 20 mm Hg) Prox LAD lesion is 50% stenosed. Hemodynamics (mmHg) RA mean 8 RV 76/1 PA 76/25 (45) PCWP 16 AO 146/55 LV 166/12 PVR 4.9 WU Cardiac Output (Fick) 5.86 Cardiac Index (Fick) 2.99  Objective:   Weight Range: 100.9 kg Body mass index is 39.4 kg/m.   Vital Signs:   Temp:  [97.8 F (36.6 C)-99 F (37.2 C)] 97.8 F (36.6 C) (12/23 0700) Pulse Rate:  [69-90] 77 (12/23 0609) Resp:  [18] 18 (12/23 0000) BP: (100-137)/(29-100) 133/40 (12/23 0609) SpO2:  [91 %-100 %] 92 % (12/23 0609) Arterial Line BP: (109-177)/(22-46) 136/34 (12/23 0500) Weight:  [100.9 kg] 100.9 kg (12/23 0500) Last BM Date: 09/25/18  Weight change: Filed Weights   09/28/18 0400 09/29/18 0500 09/30/18 0500  Weight: 109.4 kg 100.4 kg 100.9 kg    Intake/Output:   Intake/Output Summary (Last 24 hours) at 09/30/2018 0805 Last data filed at 09/30/2018 0600 Gross per 24 hour  Intake 841.92 ml  Output 1150 ml  Net -308.08 ml      Physical Exam    General: Fatigued. Elderly. No resp difficulty. HEENT: Normal Neck: Supple. JVP to jaw. Carotids 2+ bilat; no bruits. No thyromegaly or  nodule noted. Left IJ cordis. Cor: PMI nondisplaced. RRR, No M/G/R noted. WoundVac to sternum CDI. Epicardial wires in place.  Lungs: CTAB, normal effort. Abdomen: Soft, non-tender,++distended, no HSM. No bruits or masses. +BS  Extremities: No cyanosis, clubbing, or rash. R and LLE trace edema.  Neuro: Alert & orientedx3, cranial nerves grossly intact. moves all 4 extremities w/o difficulty. Affect pleasant  Telemetry   NSR 70s with PACs. Personally reviewed.   EKG    No new tracings.    Labs    CBC Recent Labs    09/29/18 0438 09/30/18 0533  WBC 21.6* 14.8*  HGB 10.7* 10.4*  HCT 35.2* 33.1*  MCV 92.6 92.5  PLT 124* 90*   Basic Metabolic Panel Recent Labs    09/28/18 0402 09/28/18 1627  09/29/18 0438 09/30/18 0533  NA 143  --    < > 141 140  K 4.0  --    < > 4.2 4.2  CL 114*  --    < > 111 108  CO2 21*  --   --  19* 20*  GLUCOSE 108*  --    < > 180* 131*  BUN 12  --    < > 21 30*  CREATININE 0.66 1.13*   < > 1.71* 1.91*  CALCIUM 7.6*  --   --  8.1* 8.1*  MG  2.7* 2.6*  --   --   --    < > = values in this interval not displayed.   Liver Function Tests Recent Labs    09/30/18 0533  AST 30  ALT 53*  ALKPHOS 58  BILITOT 0.7  PROT 5.0*  ALBUMIN 2.4*   No results for input(s): LIPASE, AMYLASE in the last 72 hours. Cardiac Enzymes No results for input(s): CKTOTAL, CKMB, CKMBINDEX, TROPONINI in the last 72 hours.  BNP: BNP (last 3 results) No results for input(s): BNP in the last 8760 hours.  ProBNP (last 3 results) No results for input(s): PROBNP in the last 8760 hours.   D-Dimer No results for input(s): DDIMER in the last 72 hours. Hemoglobin A1C No results for input(s): HGBA1C in the last 72 hours. Fasting Lipid Panel No results for input(s): CHOL, HDL, LDLCALC, TRIG, CHOLHDL, LDLDIRECT in the last 72 hours. Thyroid Function Tests No results for input(s): TSH, T4TOTAL, T3FREE, THYROIDAB in the last 72 hours.  Invalid input(s): FREET3  Other  results:   Imaging    Dg Chest Port 1 View  Result Date: 09/30/2018 CLINICAL DATA:  Chest pain. Status post coronary artery bypass grafting EXAM: PORTABLE CHEST 1 VIEW COMPARISON:  September 29, 2018 FINDINGS: There is airspace consolidation in the left mid lung. There is mild right midlung atelectasis. There is a small left pleural effusion. Lungs elsewhere are clear. There is cardiomegaly with mild pulmonary venous hypertension. No adenopathy. Cordis tip is in the left innominate vein region. No pneumothorax. Patient is status post coronary artery bypass grafting and valve replacement. There is a left atrial appendage clamp. IMPRESSION: Consolidation left mid lung. Small left pleural effusion. Pulmonary vascular congestion present. Stable postoperative changes. No pneumothorax. Electronically Signed   By: Lowella Grip III M.D.   On: 09/30/2018 07:22   [place] Ekg Site Rite  Result Date: 09/29/2018 If Site Rite image not attached, placement could not be confirmed due to current cardiac rhythm.    Medications:     Scheduled Medications: . acetaminophen  1,000 mg Oral Q6H   Or  . acetaminophen (TYLENOL) oral liquid 160 mg/5 mL  1,000 mg Per Tube Q6H  . amiodarone  400 mg Oral BID  . aspirin EC  81 mg Oral Daily   Or  . aspirin  81 mg Per Tube Daily  . bisacodyl  10 mg Oral Daily   Or  . bisacodyl  10 mg Rectal Daily  . buPROPion  300 mg Oral Daily  . Chlorhexidine Gluconate Cloth  6 each Topical Daily  . docusate sodium  200 mg Oral Daily  . enoxaparin (LOVENOX) injection  30 mg Subcutaneous QHS  . insulin aspart  0-24 Units Subcutaneous Q4H  . insulin detemir  15 Units Subcutaneous Daily  . loratadine  10 mg Oral Daily  . mouth rinse  15 mL Mouth Rinse BID  . metoprolol tartrate  12.5 mg Oral BID   Or  . metoprolol tartrate  12.5 mg Per Tube BID  . pantoprazole  40 mg Oral Daily  . potassium chloride SA  20 mEq Oral Daily  . rosuvastatin  20 mg Oral Daily  . sodium  chloride flush  10-40 mL Intracatheter Q12H  . sodium chloride flush  3 mL Intravenous Q12H    Infusions: . sodium chloride Stopped (09/28/18 1109)  . sodium chloride    . sodium chloride Stopped (09/27/18 2200)  . dexmedetomidine (PRECEDEX) IV infusion Stopped (09/28/18 0737)  . DOPamine 2.5  mcg/kg/min (09/30/18 0600)  . furosemide (LASIX) infusion 12 mg/hr (09/30/18 0600)  . lactated ringers    . lactated ringers    . lactated ringers 10 mL/hr at 09/30/18 0600  . milrinone 0.125 mcg/kg/min (09/30/18 0600)  . nitroGLYCERIN Stopped (09/27/18 1540)  . phenylephrine (NEO-SYNEPHRINE) Adult infusion Stopped (09/29/18 1552)    PRN Medications: sodium chloride, lactated ringers, metoprolol tartrate, midazolam, morphine injection, ondansetron (ZOFRAN) IV, oxyCODONE, sodium chloride flush, sodium chloride flush, traMADol    Patient Profile   Doris Jefferson is a 77 y.o. female with Chronic diastolic CHF, HTN, CAD, PAF, h/o OSA on CPAP, DM 2, HLD, moderate AS, and obesity.   Asked to see by [facility] for optimization prior to CABG.  Assessment/Plan   1. Severe 3v CAD - Severe with 75% RCA, 100% Cx, 80% LM and LAD 50% stenosed - S/p CABG x 3 with bioprosthetic AVR and LAA clip on 12/20 - Now POD#3 - Swan out. On Dopa 2.5, milrinone 0.125. Neo off. SBP 100-130s.  PA systolics were 10-62IRSW before swan pulled.  - Weight up 20+ pounds with poor response to lasix gtt. Now on lasix drip @ 12 mg/hr. PICC line to be placed this am.  - Likely can stop dopamine today. Coox 65%  2. Moderate AS - s/p bioprosthetic AVR - wide pulse pressure on exam, 120/40 this am. Follow. - will need echo prior to d/c   3. Chronic diastolic CHF - Echo 54/6/27 LVEF 55-60%, Grade 2 DD, Mild/Mod AS, Mod MR, mild LAE, RV mild dilated, Mild TR, PA peak pressure 79 mm Hg.  - Volume status elevated post CABG - Continue lasix gtt @ 12 mg/hr. Creatinine trending up 1.7 > 1.9. PICC line being placed this morning. Will  plan to monitor CVP and adjust diuretics based on that.   4. 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 Suspect unlikely to benefit from pulmonary vasodilators.  - PA systolics were 03-50KXFG before swan pulled. Continue milrinone.  - Continue lasix drip @ 12 mg/hr for now.  4. PAF - NSR overnight with PACs - Continue po amio at 400 bid - Resume AC when ok with surgery  6. OSA on CPAP - Continue CPAP. No change.     7. DM 2 - Per TCTS. No change.   8. AKI - Creatinine 0.9 > 1.7 > 1.9. Suspect ATN. Monitor closely with diuresis.    Length of Stay: [place], NP  09/30/2018, 8:05 AM  Advanced Heart Failure Team Pager [phone] (M-F; 7a - 4p)  Please contact [facility] for night-coverage after hours (4p -7a ) and weekends on [email or web address]  Patient seen and examined with the above-signed Advanced Practice Provider and/or Housestaff. I personally reviewed laboratory data, imaging studies and relevant notes. I independently examined the patient and formulated the important aspects of the plan. I have edited the note to reflect any of my changes or salient points. I have personally discussed the plan with the patient and/or family.  Remains on dopamine and milrinone. Co-ox but CVP remains elevated. On lasix gtt with only modest output. Creatinine up a bit. Suspect a component of ATN. Will continue lasix gtt at current rate and follow. Hopefully she will break through soon. Remains in NSR. Continue po amiodarone. Mobilize. Restart AC when ok with TCTS.   Glori Bickers,  MD  3:24 PM

## 2018-09-30 NOTE — Discharge Summary (Signed)
Physician Discharge Summary  Patient ID: Doris Jefferson MRN: [id number] DOB/AGE: Nov 08, 1940 77 y.o.  Admit date: 09/24/2018 Discharge date: 10/08/2018  Admission Diagnoses:  Patient Active Problem List   Diagnosis Date Noted  . CAD (coronary artery disease) 09/24/2018  . Abnormal echocardiogram   . DOE (dyspnea on exertion)   . Coronary artery disease   . Vertigo 02/08/2018  . Iron deficiency anemia, unspecified 11/13/2017  . HTN (hypertension) 09/25/2016  . Esophageal reflux 02/23/2016  . Pure hypercholesterolemia 02/23/2016  . Unspecified cataract 02/23/2016  . Paroxysmal atrial fibrillation ([place]) 02/23/2016  . Visit for monitoring Tikosyn therapy 09/06/2015  . Persistent atrial fibrillation 06/23/2014  . Obesity (BMI 30-39.9) 03/04/2014  . OSA (obstructive sleep apnea) 12/31/2013  . Anticoagulation goal of INR 2 to 3 11/17/2013  . Moderate aortic stenosis 11/10/2013  . Pulmonary HTN ([place]) 11/10/2013  . Diabetes mellitus ([place]) 11/10/2013    Discharge Diagnoses:   Patient Active Problem List   Diagnosis Date Noted  . Diabetes mellitus type 2 in obese ([place])   . Acute blood loss anemia   . PAF (paroxysmal atrial fibrillation) ([place])   . Pulmonary hypertension ([place])   . Depression   . Tachypnea   . Leukocytosis   . 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 S/P AVR (aortic valve replacement) 09/27/2018  . CAD (coronary artery disease) 09/24/2018  . Abnormal echocardiogram   . DOE (dyspnea on exertion)   . Coronary artery disease   . Vertigo 02/08/2018  . Iron deficiency anemia, unspecified 11/13/2017  . HTN (hypertension) 09/25/2016  . Esophageal reflux 02/23/2016  . Pure hypercholesterolemia 02/23/2016  . Unspecified cataract 02/23/2016  . Paroxysmal atrial fibrillation ([place]) 02/23/2016  . Visit for monitoring Tikosyn therapy 09/06/2015  . Persistent atrial fibrillation 06/23/2014  . Obesity (BMI 30-39.9) 03/04/2014  . OSA (obstructive sleep apnea) 12/31/2013  .  Anticoagulation goal of INR 2 to 3 11/17/2013  . Moderate aortic stenosis 11/10/2013  . Pulmonary HTN ([place]) 11/10/2013  . Diabetes mellitus ([place]) 11/10/2013   Discharged Condition: good  History of Present Illness:  Doris Jefferson is a 78 yo morbidly obese white female with known history of Hypertension, Hyperlipidemia, Atrial Fibrillation, diabetes, Aortic stenosis, and pulmonary hypertension.  She has been followed by Cardiology for her Atrial Fibrillation.  She underwent an Echocardiogram earlier this month that showed a preserved EF, Grade 2 diastolic dysfunction, mild to moderate aortic stenosis, moderate MR, mild TR and severely increase PA pressures.  It was felt she should undergo cardiac catheterization for further evaluation.  This was performed on 09/24/2018 and showed severe 3 vessel CAD with mild aortic stenosis.  Due to her elevated PA pressures, the advance heart failure team was consulted.  After evaluation he felt the elevated pressures were likely related to her obesity and OSA.  They  felt the patient would be able to proceed with CABG/AVR.  Cardiothoracic surgery consult was obtained.  The patient admitted to only exertional shortness of breath, mainly while cleaning her home.  She requires a cart at the grocery store to steady her during her shopping.  She denied nausea, chest pain, and diaphoresis.  Dr. [name] reviewed the patient's studies and felt she should undergo coronary bypass grafting, possible MAZE procedure, and possible aortic valve replacement.  The risks and benefits of the procedure were explained to the patient and she was agreeable to proceed.   Hospital Course:   The patient was placed on Milrinone prior to surgery by AHF team.  She  was taken to the operating room on 09/27/2018.  She underwent CABG x 3 utilizing LIMA to LAD, SVG to RCA, and SVG to OM.  She also underwent replacement of her Aortic Valve with a 21 mm [facility] Ease tissue prosthetic.  She underwent  clipping of her LA appendage and endoscopic harvest of the greater saphenous vein from her right leg.  She tolerated the procedure without difficulty and was taken to the SICU in stable condition.  She required transfusion of 2 units of packed cells during the procedure.  The patient was weaned off Nitric oxide overnight.  During her stay in the SICU the patient was weaned and extubated on POD #1.  The patient was weaned off Dopamine, Neo-synephrine,  and Milrinone as tolerated.  Her chest tubes were removed on 09/29/2018.  She was aggressively diuresed with a lasix drip.  She was monitored by the AHF team closely.  She developed an elevation in her creatinine likely due to aggressive diuretics.  Her level peaked at 1.91 and is trending down appropriately.  Her lopressor was discontinued due to elevated PAH.  She was treated with IV Amiodarone post operatively due clipping of her LA appendage.  She was initially maintaining NSR, but developed atrial fibrillation several days after surgery.  She has been placed on an oral regimen of Amiodarone.  Her pacing wires were removed without difficulty and she was restarted on her home regimen of Eliquis.  She developed hypovolemia due to aggressive diuretics.  She was treated with a NS bolus.  The patient was stable for transfer to the telemetry unit on 10/05/2018.  She remains in Atrial Fibrillation.  She has been transitioned to an oral regimen of Amiodarone.  She underwent cardioversion on 10/07/2018 and was in sinus rhythm.  She has been started on Sildenafil for Pulmonary hypertension.  She is deconditioned and was evaluated by CIR. Home health was recommended.  She had a mild elevation in her creatinine and her lasix dose was decreased to 20 mg daily.  She will have a repeat BMET in 1 week with AHF clinic.  She is ambulating independently.  Her incisions are healing without evidence of infection. She is medically stable for discharge home today.  Consults:  cardiology  Significant Diagnostic Studies: angiography:    Ost RCA to Prox RCA lesion is 75% stenosed.  Ost Cx to Prox Cx lesion is 100% stenosed.  Dist LM lesion is 80% stenosed.  Ost 1st Diag lesion is 75% stenosed.  Ost Ramus lesion is 75% stenosed.  Prox LAD lesion is 50% stenosed.  The left ventricular systolic function is normal.  LV end diastolic pressure is normal.  The left ventricular ejection fraction is 55-65% by visual estimate.  There is mild aortic valve stenosis. Mean gradient 20 mm Hg.  Hemodynamic findings consistent with moderate pulmonary hypertension.  Ao sat 92%, PA sat 64%, mean PA pressure 45 mm Hg; mean PCWP 16 mm Hg; CO 5.86 L/min; CI 2.99  Echocardiogram: - Left ventricle: The cavity size was normal. Systolic function was   normal. The estimated ejection fraction was in the range of 55%   to 60%. Constrast utilized. Wall motion was normal; there were no   regional wall motion abnormalities. Features are consistent with   a pseudonormal left ventricular filling pattern, with concomitant   abnormal relaxation and increased filling pressure (grade 2   diastolic dysfunction). - Aortic valve: There was mild to moderate stenosis. Peak velocity   (S): 290 cm/s.  Mean gradient (S): 17 mm Hg. - Mitral valve: There was moderate regurgitation. - Left atrium: The atrium was mildly dilated. - Right ventricle: The cavity size was mildly dilated. Wall   thickness was normal. - Tricuspid valve: There was mild regurgitation. - Pulmonary arteries: Systolic pressure was severely increased. PA   peak pressure: 78 mm Hg (S).  Treatments: surgery:    Coronary artery bypass grafting x3 with the left internal mammary to the left anterior descending coronary artery, reverse saphenous vein graft to the obtuse marginal coronary artery, reverse saphenous vein graft to the right coronary artery with right thigh greater saphenous endoscopic vein harvesting and aortic  valve replacement with pericardial tissue valve, [employer] model #3300TFX 21 mm, serial #[id number] and placement of AtriCure left atrial clip 35 mm.  Discharge Exam: Blood pressure (!) 149/46, pulse 66, temperature 98.2 F (36.8 C), temperature source Oral, resp. rate 18, height 5\' 3"  (1.6 m), weight 96.8 kg, SpO2 98 %.   General appearance: alert, cooperative and no distress Heart: regular rate and rhythm Lungs: clear to auscultation bilaterally Abdomen: soft, non-tender; bowel sounds normal; no masses,  no organomegaly Extremities: edema trace Wound: clean and dry   Discharge disposition: 01-Home or Self Care  Discharge Medications:  The patient has been discharged on:   1.Beta Blocker:  Yes Valu.Nieves   ]                              No   [   ]                              If No, reason:  2.Ace Inhibitor/ARB: Yes [   ]                                     No  [  X  ]                                     If No, reason:  3.Statin:   Yes [  X ]                  No  [   ]                  If No, reason:  4.Ecasa:  Yes  [ X  ]                  No   [   ]                  If No, reason:     Discharge Instructions    Amb Referral to Cardiac Rehabilitation   Complete by:  As directed    Diagnosis:  CABG   CABG X ___:  3     Allergies as of 10/08/2018      Reactions   Minocin [minocycline Hcl] Swelling, Other (See Comments)   THROAT SWELLING   Benazepril Cough   Hydrochlorothiazide Itching   Tape Rash      Medication List    STOP taking these medications   diltiazem 180 MG 24 hr capsule Commonly known as:  CARDIZEM CD   losartan 50  MG tablet Commonly known as:  COZAAR   metoprolol succinate 25 MG 24 hr tablet Commonly known as:  TOPROL-XL   terazosin 5 MG capsule Commonly known as:  HYTRIN     TAKE these medications   acetaminophen 325 MG tablet Commonly known as:  TYLENOL Take 650 mg by mouth every 6 (six) hours as needed for moderate pain or  headache.   amiodarone 200 MG tablet Commonly known as:  PACERONE Take 1 tablet (200 mg total) by mouth 2 (two) times daily. For 2 weeks, then decrease to 200 mg daily What changed:  See the new instructions.   apixaban 5 MG Tabs tablet Commonly known as:  ELIQUIS Take 1 tablet (5 mg total) by mouth 2 (two) times daily. What changed:  See the new instructions.   aspirin 81 MG EC tablet Take 1 tablet (81 mg total) by mouth daily.   buPROPion 300 MG 24 hr tablet Commonly known as:  WELLBUTRIN XL Take 300 mg by mouth daily.   carvedilol 3.125 MG tablet Commonly known as:  COREG Take 1 tablet (3.125 mg total) by mouth 2 (two) times daily with a meal.   doxylamine (Sleep) 25 MG tablet Commonly known as:  UNISOM Take 25 mg by mouth at bedtime as needed for sleep.   fexofenadine 60 MG tablet Commonly known as:  ALLEGRA Take 60 mg by mouth daily as needed for allergies.   furosemide 20 MG tablet Commonly known as:  LASIX TAKE 1 TABLET BY MOUTH DAILY   glimepiride 4 MG tablet Commonly known as:  AMARYL Take 4 mg by mouth daily with breakfast.   MAGNESIUM-OXIDE 400 (241.3 Mg) MG tablet Generic drug:  magnesium oxide TAKE 1 TABLET BY MOUTH TWICE DAILY What changed:  how much to take   metFORMIN 500 MG tablet Commonly known as:  GLUCOPHAGE Take 1 tablet (500 mg total) by mouth 2 (two) times daily with a meal. What changed:    medication strength  how much to take   oxyCODONE 5 MG immediate release tablet Commonly known as:  Oxy IR/ROXICODONE Take 1-2 tablets (5-10 mg total) by mouth every 4 (four) hours as needed for severe pain.   rosuvastatin 20 MG tablet Commonly known as:  CRESTOR Take 20 mg by mouth daily.   sildenafil 20 MG tablet Commonly known as:  REVATIO Take 1 tablet (20 mg total) by mouth 3 (three) times daily.   spironolactone 25 MG tablet Commonly known as:  ALDACTONE Take 0.5 tablets (12.5 mg total) by mouth daily.   VICTOZA 18 MG/3ML  Sopn Generic drug:  liraglutide Inject 1.8 mg into the skin daily.            Durable Medical Equipment  (From admission, onward)         Start     Ordered   10/08/18 0951  For home use only DME 4 wheeled rolling walker with seat  Once    Question:  Patient needs a walker to treat with the following condition  Answer:  Physical deconditioning   10/08/18 2992         Follow-up Information    Belva Crome, MD Follow up on 10/17/2018.   Specialty:  Cardiology Why:  Please arrive 15 minutes early for your 8:20am post-hospital cardiology appointment Contact information: [address] [place] [address] [address] [phone]        Grace Isaac, MD Follow up on 11/07/2018.   Specialty:  Cardiothoracic Surgery Why:  Appointment is at 4:00, please get CXR at 3:30 at [facility] located on first floor of our office building Contact information: [address] [place] [address] [phone]        [facility]. Go on 10/22/2018.   Specialty:  Cardiology Why:  at 2:30pm in the Advanced Heart Failure clinic.  Please bring all medications to appt.  Gate code is [phone] for January. Contact information: [address] [id number] [place] [phone]          Signed: Ellwood Handler 10/08/2018, 9:52 AM

## 2018-10-01 ENCOUNTER — Inpatient Hospital Stay (HOSPITAL_COMMUNITY): Payer: Medicare Other

## 2018-10-01 LAB — COOXEMETRY PANEL
Carboxyhemoglobin: 1.6 % — ABNORMAL HIGH (ref 0.5–1.5)
Methemoglobin: 1.1 % (ref 0.0–1.5)
O2 Saturation: 59.1 %
Total hemoglobin: 12 g/dL (ref 12.0–16.0)

## 2018-10-01 LAB — GLUCOSE, CAPILLARY
GLUCOSE-CAPILLARY: 126 mg/dL — AB (ref 70–99)
Glucose-Capillary: 188 mg/dL — ABNORMAL HIGH (ref 70–99)
Glucose-Capillary: 140 mg/dL — ABNORMAL HIGH (ref 70–99)
Glucose-Capillary: 120 mg/dL — ABNORMAL HIGH (ref 70–99)
Glucose-Capillary: 146 mg/dL — ABNORMAL HIGH (ref 70–99)
Glucose-Capillary: 131 mg/dL — ABNORMAL HIGH (ref 70–99)

## 2018-10-01 LAB — CBC
HCT: 33 % — ABNORMAL LOW (ref 36.0–46.0)
Hemoglobin: 10.3 g/dL — ABNORMAL LOW (ref 12.0–15.0)
MCH: 28.8 pg (ref 26.0–34.0)
MCHC: 31.2 g/dL (ref 30.0–36.0)
MCV: 92.2 fL (ref 80.0–100.0)
Platelets: 124 10*3/uL — ABNORMAL LOW (ref 150–400)
RBC: 3.58 MIL/uL — ABNORMAL LOW (ref 3.87–5.11)
RDW: 15.2 % (ref 11.5–15.5)
WBC: 12.6 10*3/uL — AB (ref 4.0–10.5)
nRBC: 0 % (ref 0.0–0.2)

## 2018-10-01 LAB — BASIC METABOLIC PANEL
Anion gap: 10 (ref 5–15)
BUN: 36 mg/dL — ABNORMAL HIGH (ref 8–23)
CO2: 22 mmol/L (ref 22–32)
Calcium: 8.3 mg/dL — ABNORMAL LOW (ref 8.9–10.3)
Chloride: 104 mmol/L (ref 98–111)
Creatinine, Ser: 1.89 mg/dL — ABNORMAL HIGH (ref 0.44–1.00)
GFR calc Af Amer: 29 mL/min — ABNORMAL LOW (ref >60)
GFR calc non Af Amer: 25 mL/min — ABNORMAL LOW (ref >60)
Glucose, Bld: 191 mg/dL — ABNORMAL HIGH (ref 70–99)
Potassium: 3.6 mmol/L (ref 3.5–5.1)
SODIUM: 136 mmol/L (ref 135–145)

## 2018-10-01 MED ORDER — INSULIN ASPART 100 UNIT/ML ~~LOC~~ SOLN: 0.0000 [IU] | Freq: Every day | SUBCUTANEOUS | Status: DC

## 2018-10-01 MED ORDER — INSULIN ASPART 100 UNIT/ML ~~LOC~~ SOLN
0.0000 [IU] | Freq: Three times a day (TID) | SUBCUTANEOUS | Status: DC
  Administered 2018-10-01 – 2018-10-02 (×2): 2 [IU] via SUBCUTANEOUS
  Administered 2018-10-02: 5 [IU] via SUBCUTANEOUS
  Administered 2018-10-02: 3 [IU] via SUBCUTANEOUS
  Administered 2018-10-03 – 2018-10-04 (×2): 2 [IU] via SUBCUTANEOUS
  Administered 2018-10-04: 8 [IU] via SUBCUTANEOUS
  Administered 2018-10-04: 3 [IU] via SUBCUTANEOUS
  Administered 2018-10-05: 5 [IU] via SUBCUTANEOUS
  Administered 2018-10-05: 3 [IU] via SUBCUTANEOUS

## 2018-10-01 MED ORDER — DOPAMINE-DEXTROSE 3.2-5 MG/ML-% IV SOLN: 1.5000 ug/kg/min | INTRAVENOUS | Status: DC

## 2018-10-01 MED ORDER — POTASSIUM CHLORIDE 10 MEQ/50ML IV SOLN
10.0000 meq | INTRAVENOUS | Status: AC
  Administered 2018-10-01 (×3): 10 meq via INTRAVENOUS
  Filled 2018-10-01 (×3): qty 50

## 2018-10-01 MED FILL — Mannitol IV Soln 20%: INTRAVENOUS | Qty: 500 | Status: AC

## 2018-10-01 MED FILL — Heparin Sodium (Porcine) Inj 1000 Unit/ML: INTRAMUSCULAR | Qty: 30 | Status: AC

## 2018-10-01 MED FILL — Sodium Chloride IV Soln 0.9%: INTRAVENOUS | Qty: 2000 | Status: AC

## 2018-10-01 MED FILL — Electrolyte-R (PH 7.4) Solution: INTRAVENOUS | Qty: 4000 | Status: AC

## 2018-10-01 MED FILL — Lidocaine HCl(Cardiac) IV PF Soln Pref Syr 100 MG/5ML (2%): INTRAVENOUS | Qty: 5 | Status: AC

## 2018-10-01 MED FILL — Sodium Bicarbonate IV Soln 8.4%: INTRAVENOUS | Qty: 50 | Status: AC

## 2018-10-01 NOTE — Progress Notes (Signed)
      [address]       [place] [address]             [phone]      POD # 4 AVR, CABG x 3  Up in chair  BP (!) 141/47   Pulse 66   Temp 98.2 F (36.8 C) (Oral)   Resp (!) 22   Ht 5\' 3"  (1.6 m)   Wt 97 kg   SpO2 97%   BMI 37.88 kg/m   Intake/Output Summary (Last 24 hours) at 10/01/2018 1710 Last data filed at 10/01/2018 1600 Gross per 24 hour  Intake 1382.24 ml  Output 4130 ml  Net -2747.76 ml   Continues to diurese with lower dose lasix and decreased dopamine dose  Doris Lipps C. Roxan Hockey, MD [facility] [phone]

## 2018-10-01 NOTE — Progress Notes (Signed)
4 Days Post-Op Procedure(s) (LRB): CORONARY ARTERY BYPASS GRAFTING (CABG) x three, using left internal mammary artery and right leg greater saphenous vein harvested endoscopically (N/A) AORTIC VALVE REPLACEMENT (AVR) USING MAGNA EASE SIZE 21 MM (N/A) CLIPPING OF ATRIAL APPENDAGE USING ATRICURE FLEXV SIZE 35MM (N/A) TRANSESOPHAGEAL ECHOCARDIOGRAM (TEE) (N/A) Subjective: Up in chair Appetite poor, minimal pain  Objective: Vital signs in last 24 hours: Temp:  [97.8 F (36.6 C)-99 F (37.2 C)] 98 F (36.7 C) (12/24 0700) Pulse Rate:  [69-80] 76 (12/24 0600) Cardiac Rhythm: Normal sinus rhythm (12/24 0400) BP: (94-141)/(34-81) 128/57 (12/24 0600) SpO2:  [95 %-100 %] 99 % (12/24 0600) Weight:  [97 kg] 97 kg (12/24 0700)  Hemodynamic parameters for last 24 hours: CVP:  [5 mmHg-15 mmHg] 9 mmHg  Intake/Output from previous day: 12/23 0701 - 12/24 0700 In: 551.7 [I.V.:551.7] Out: 3250 [Urine:3250] Intake/Output this shift: No intake/output data recorded.  General appearance: alert, cooperative and no distress Neurologic: intact Heart: regular rate and rhythm Lungs: diminished breath sounds bibasilar Abdomen: normal findings: soft, non-tender Wound: VAC in place  Lab Results: Recent Labs    09/30/18 0533 10/01/18 0359  WBC 14.8* 12.6*  HGB 10.4* 10.3*  HCT 33.1* 33.0*  PLT 90* 124*   BMET:  Recent Labs    09/30/18 0533 10/01/18 0359  NA 140 136  K 4.2 3.6  CL 108 104  CO2 20* 22  GLUCOSE 131* 191*  BUN 30* 36*  CREATININE 1.91* 1.89*  CALCIUM 8.1* 8.3*    PT/INR: No results for input(s): LABPROT, INR in the last 72 hours. ABG    Component Value Date/Time   PHART 7.345 (L) 09/28/2018 1109   HCO3 21.5 09/28/2018 1109   TCO2 23 09/28/2018 1629   ACIDBASEDEF 4.0 (H) 09/28/2018 1109   O2SAT 59.1 10/01/2018 0405   CBG (last 3)  Recent Labs    09/30/18 2354 10/01/18 0405 10/01/18 0733  GLUCAP 150* 188* 126*    Assessment/Plan: S/P Procedure(s)  (LRB): CORONARY ARTERY BYPASS GRAFTING (CABG) x three, using left internal mammary artery and right leg greater saphenous vein harvested endoscopically (N/A) AORTIC VALVE REPLACEMENT (AVR) USING MAGNA EASE SIZE 21 MM (N/A) CLIPPING OF ATRIAL APPENDAGE USING ATRICURE FLEXV SIZE 35MM (N/A) TRANSESOPHAGEAL ECHOCARDIOGRAM (TEE) (N/A) - POD #  4AVR, CABG  CV- stable in SR, co-ox 59 on low dose milrinone and dopamine RESP- continue IS RENAL- creatinine elevated at 1.9 but stable, lytes OK  Continue lasix drip ENDO- CBG mildly elevated- continue levemir + SSI Thrombocytopenia- PLT up to 124K Deconditioning- mobilize as tolerated   LOS: 7 days    Doris Jefferson 10/01/2018

## 2018-10-01 NOTE — Progress Notes (Signed)
TCTS DAILY ICU PROGRESS NOTE                   [place]            [place] [address]          [phone]   4 Days Post-Op Procedure(s) (LRB): CORONARY ARTERY BYPASS GRAFTING (CABG) x three, using left internal mammary artery and right leg greater saphenous vein harvested endoscopically (N/A) AORTIC VALVE REPLACEMENT (AVR) USING MAGNA EASE SIZE 21 MM (N/A) CLIPPING OF ATRIAL APPENDAGE USING ATRICURE FLEXV SIZE 35MM (N/A) TRANSESOPHAGEAL ECHOCARDIOGRAM (TEE) (N/A)  Total Length of Stay:  LOS: 7 days   Subjective: Up in chair , feels better   Objective: Vital signs in last 24 hours: Temp:  [97.8 F (36.6 C)-99 F (37.2 C)] 98 F (36.7 C) (12/24 0700) Pulse Rate:  [69-80] 71 (12/24 0800) Cardiac Rhythm: Normal sinus rhythm (12/24 0400) Resp:  [21] 21 (12/24 0900) BP: (94-141)/(34-81) 136/37 (12/24 0900) SpO2:  [95 %-100 %] 100 % (12/24 0800) Weight:  [97 kg] 97 kg (12/24 0700)  Filed Weights   09/29/18 0500 09/30/18 0500 10/01/18 0700  Weight: 100.4 kg 100.9 kg 97 kg    Weight change: -3.9 kg   Hemodynamic parameters for last 24 hours: CVP:  [5 mmHg-15 mmHg] 7 mmHg  Intake/Output from previous day: 12/23 0701 - 12/24 0700 In: 551.7 [I.V.:551.7] Out: 3250 [Urine:3250]  Intake/Output this shift: Total I/O In: 282.1 [P.O.:220; I.V.:41.2; IV Piggyback:20.9] Out: 475 [Urine:475]  Current Meds: Scheduled Meds: . acetaminophen  1,000 mg Oral Q6H   Or  . acetaminophen (TYLENOL) oral liquid 160 mg/5 mL  1,000 mg Per Tube Q6H  . amiodarone  400 mg Oral BID  . aspirin EC  81 mg Oral Daily   Or  . aspirin  81 mg Per Tube Daily  . bisacodyl  10 mg Oral Daily   Or  . bisacodyl  10 mg Rectal Daily  . buPROPion  300 mg Oral Daily  . Chlorhexidine Gluconate Cloth  6 each Topical Daily  . docusate sodium  200 mg Oral Daily  . enoxaparin (LOVENOX) injection  30 mg Subcutaneous QHS  . insulin aspart  0-15 Units Subcutaneous TID WC  . insulin aspart   0-5 Units Subcutaneous QHS  . insulin detemir  15 Units Subcutaneous Daily  . loratadine  10 mg Oral Daily  . mouth rinse  15 mL Mouth Rinse BID  . metoprolol tartrate  12.5 mg Oral BID   Or  . metoprolol tartrate  12.5 mg Per Tube BID  . pantoprazole  40 mg Oral Daily  . potassium chloride SA  20 mEq Oral Daily  . rosuvastatin  20 mg Oral Daily  . sodium chloride flush  10-40 mL Intracatheter Q12H  . sodium chloride flush  10-40 mL Intracatheter Q12H  . sodium chloride flush  3 mL Intravenous Q12H   Continuous Infusions: . sodium chloride Stopped (09/28/18 1109)  . sodium chloride    . sodium chloride 10 mL/hr at 09/30/18 1031  . dexmedetomidine (PRECEDEX) IV infusion Stopped (09/28/18 0737)  . DOPamine 2.5 mcg/kg/min (10/01/18 0900)  . furosemide (LASIX) infusion 12 mg/hr (10/01/18 0900)  . lactated ringers    . lactated ringers    . lactated ringers Stopped (09/30/18 1012)  . milrinone 0.125 mcg/kg/min (10/01/18 0900)  . nitroGLYCERIN Stopped (09/27/18 1540)  . phenylephrine (NEO-SYNEPHRINE) Adult infusion Stopped (09/29/18 1552)  . potassium chloride 50 mL/hr at 10/01/18 [phone]  PRN Meds:.sodium chloride, lactated ringers, metoprolol tartrate, midazolam, morphine injection, ondansetron (ZOFRAN) IV, oxyCODONE, sodium chloride flush, sodium chloride flush, sodium chloride flush, traMADol  General appearance: alert, cooperative and no distress Neurologic: intact Heart: regular rate and rhythm, S1, S2 normal, no murmur, click, rub or gallop Lungs: diminished breath sounds bibasilar Abdomen: soft, non-tender; bowel sounds normal; no masses,  no organomegaly Extremities: extremities normal, atraumatic, no cyanosis or edema and Homans sign is negative, no sign of DVT Wound: incissional vac in place   Lab Results: CBC: Recent Labs    09/30/18 0533 10/01/18 0359  WBC 14.8* 12.6*  HGB 10.4* 10.3*  HCT 33.1* 33.0*  PLT 90* 124*   BMET:  Recent Labs    09/30/18 0533  10/01/18 0359  NA 140 136  K 4.2 3.6  CL 108 104  CO2 20* 22  GLUCOSE 131* 191*  BUN 30* 36*  CREATININE 1.91* 1.89*  CALCIUM 8.1* 8.3*    CMET: Lab Results  Component Value Date   WBC 12.6 (H) 10/01/2018   HGB 10.3 (L) 10/01/2018   HCT 33.0 (L) 10/01/2018   PLT 124 (L) 10/01/2018   GLUCOSE 191 (H) 10/01/2018   CHOL 145 05/08/2018   TRIG 152 (H) 05/08/2018   HDL 43 05/08/2018   LDLCALC 72 05/08/2018   ALT 53 (H) 09/30/2018   AST 30 09/30/2018   NA 136 10/01/2018   K 3.6 10/01/2018   CL 104 10/01/2018   CREATININE 1.89 (H) 10/01/2018   BUN 36 (H) 10/01/2018   CO2 22 10/01/2018   TSH 3.000 05/08/2018   INR 1.47 09/27/2018      PT/INR: No results for input(s): LABPROT, INR in the last 72 hours. Radiology: Dg Chest Port 1 View  Result Date: 10/01/2018 CLINICAL DATA:  Cardiac surgery. EXAM: PORTABLE CHEST 1 VIEW COMPARISON:  09/30/2018. FINDINGS: Cardiomegaly, with atrial appendage clip and sequelae of CABG unchanged. Central venous catheter introducer has been removed. PICC line from RIGHT arm approach lies in the proximal SVC. No chest tube or mediastinal tube. BILATERAL pulmonary opacities, greater on the LEFT, could represent consolidation. Small BILATERAL effusions. IMPRESSION: Stable chest. PICC line has been inserted, lies with its tip in proximal SVC Electronically Signed   By: Staci Righter M.D.   On: 10/01/2018 07:27   [facility]  [facility]  Assessment/Plan: S/P Procedure(s) (LRB): CORONARY ARTERY BYPASS GRAFTING (CABG) x three, using left internal mammary artery and right leg greater saphenous vein harvested endoscopically (N/A) AORTIC VALVE REPLACEMENT (AVR) USING MAGNA EASE SIZE 21 MM (N/A) CLIPPING OF ATRIAL APPENDAGE USING ATRICURE FLEXV SIZE 35MM (N/A) TRANSESOPHAGEAL ECHOCARDIOGRAM (TEE) (N/A) Renal function stabilized  Still on lasix drip and milrinone  Cr stabilized  >3 l uop 34 hours - decrease dopamine and lasix drop as move to diuretic phase Ronita Hipps 10/01/2018 9:26 AM

## 2018-10-01 NOTE — Progress Notes (Addendum)
Advanced Heart Failure Rounding Note  PCP-Cardiologist: Sinclair Grooms, MD   Subjective:    S/p CABG x 3 with bioprosthetic AVR and LAA clip on 12/20  Remains on dopamine 2.5 and milrinone 0.125. Neo off. Co-ox 59%  Remains in NSR on PO amio.   Brisk diuresis with lasix drip 12 mg/hr. Negative 2.7 L. Weight down 9 lbs. Creatinine 1.7 > 1.9 > 1.89.SBP 110-120s generally. CVP 7-8 this am.   PA systolics were 40-98 mmHG before swan pulled.    [facility] 09/24/18  Ost RCA to Prox RCA lesion is 75% stenosed.  Ost Cx to Prox Cx lesion is 100% stenosed.  Dist LM lesion is 80% stenosed.  Ost 1st Diag lesion is 75% stenosed.  Ost Ramus lesion is 75% stenosed.  Mild to moderate aortic stenosis (Mean gradient 20 mm Hg) Prox LAD lesion is 50% stenosed. Hemodynamics (mmHg) RA mean 8 RV 76/1 PA 76/25 (45) PCWP 16 AO 146/55 LV 166/12 PVR 4.9 WU Cardiac Output (Fick) 5.86 Cardiac Index (Fick) 2.99  Objective:   Weight Range: 97 kg Body mass index is 37.88 kg/m.   Vital Signs:   Temp:  [97.8 F (36.6 C)-99 F (37.2 C)] 98 F (36.7 C) (12/24 0700) Pulse Rate:  [69-80] 76 (12/24 0600) BP: (94-141)/(34-81) 128/57 (12/24 0600) SpO2:  [95 %-100 %] 99 % (12/24 0600) Weight:  [97 kg] 97 kg (12/24 0700) Last BM Date: (PTA)  Weight change: Filed Weights   09/29/18 0500 09/30/18 0500 10/01/18 0700  Weight: 100.4 kg 100.9 kg 97 kg    Intake/Output:   Intake/Output Summary (Last 24 hours) at 10/01/2018 0806 Last data filed at 10/01/2018 0700 Gross per 24 hour  Intake 492.62 ml  Output 3150 ml  Net -2657.38 ml      Physical Exam    General: Fatigued. Elderly. No resp difficulty. HEENT: Normal Neck: Supple. JVP ~8. Carotids 2+ bilat; no bruits. No thyromegaly or nodule noted. Cor: PMI nondisplaced. RRR, No M/G/R noted. WoundVac to sternum CDI.  Lungs: CTAB, normal effort. Abdomen: Soft, non-tender, +distended, no HSM. No bruits or masses. +BS  Extremities: No  cyanosis, clubbing, or rash. R and LLE trace edema.  Neuro: Alert & orientedx3, cranial nerves grossly intact. moves all 4 extremities w/o difficulty. Affect pleasant  Telemetry   NSR 70s with occ PACs. Personally reviewed.   EKG    No new tracings.    Labs    CBC Recent Labs    09/30/18 0533 10/01/18 0359  WBC 14.8* 12.6*  HGB 10.4* 10.3*  HCT 33.1* 33.0*  MCV 92.5 92.2  PLT 90* 119*   Basic Metabolic Panel Recent Labs    09/28/18 1627  09/30/18 0533 10/01/18 0359  NA  --    < > 140 136  K  --    < > 4.2 3.6  CL  --    < > 108 104  CO2  --    < > 20* 22  GLUCOSE  --    < > 131* 191*  BUN  --    < > 30* 36*  CREATININE 1.13*   < > 1.91* 1.89*  CALCIUM  --    < > 8.1* 8.3*  MG 2.6*  --   --   --    < > = values in this interval not displayed.   Liver Function Tests Recent Labs    09/30/18 0533  AST 30  ALT 53*  ALKPHOS 58  BILITOT  0.7  PROT 5.0*  ALBUMIN 2.4*   No results for input(s): LIPASE, AMYLASE in the last 72 hours. Cardiac Enzymes No results for input(s): CKTOTAL, CKMB, CKMBINDEX, TROPONINI in the last 72 hours.  BNP: BNP (last 3 results) No results for input(s): BNP in the last 8760 hours.  ProBNP (last 3 results) No results for input(s): PROBNP in the last 8760 hours.   D-Dimer No results for input(s): DDIMER in the last 72 hours. Hemoglobin A1C No results for input(s): HGBA1C in the last 72 hours. Fasting Lipid Panel No results for input(s): CHOL, HDL, LDLCALC, TRIG, CHOLHDL, LDLDIRECT in the last 72 hours. Thyroid Function Tests No results for input(s): TSH, T4TOTAL, T3FREE, THYROIDAB in the last 72 hours.  Invalid input(s): FREET3  Other results:   Imaging    Dg Chest Port 1 View  Result Date: 10/01/2018 CLINICAL DATA:  Cardiac surgery. EXAM: PORTABLE CHEST 1 VIEW COMPARISON:  09/30/2018. FINDINGS: Cardiomegaly, with atrial appendage clip and sequelae of CABG unchanged. Central venous catheter introducer has been removed.  PICC line from RIGHT arm approach lies in the proximal SVC. No chest tube or mediastinal tube. BILATERAL pulmonary opacities, greater on the LEFT, could represent consolidation. Small BILATERAL effusions. IMPRESSION: Stable chest. PICC line has been inserted, lies with its tip in proximal SVC Electronically Signed   By: Staci Righter M.D.   On: 10/01/2018 07:27     Medications:     Scheduled Medications: . acetaminophen  1,000 mg Oral Q6H   Or  . acetaminophen (TYLENOL) oral liquid 160 mg/5 mL  1,000 mg Per Tube Q6H  . amiodarone  400 mg Oral BID  . aspirin EC  81 mg Oral Daily   Or  . aspirin  81 mg Per Tube Daily  . bisacodyl  10 mg Oral Daily   Or  . bisacodyl  10 mg Rectal Daily  . buPROPion  300 mg Oral Daily  . Chlorhexidine Gluconate Cloth  6 each Topical Daily  . docusate sodium  200 mg Oral Daily  . enoxaparin (LOVENOX) injection  30 mg Subcutaneous QHS  . insulin aspart  0-15 Units Subcutaneous TID WC  . insulin aspart  0-5 Units Subcutaneous QHS  . insulin detemir  15 Units Subcutaneous Daily  . loratadine  10 mg Oral Daily  . mouth rinse  15 mL Mouth Rinse BID  . metoprolol tartrate  12.5 mg Oral BID   Or  . metoprolol tartrate  12.5 mg Per Tube BID  . pantoprazole  40 mg Oral Daily  . potassium chloride SA  20 mEq Oral Daily  . rosuvastatin  20 mg Oral Daily  . sodium chloride flush  10-40 mL Intracatheter Q12H  . sodium chloride flush  10-40 mL Intracatheter Q12H  . sodium chloride flush  3 mL Intravenous Q12H    Infusions: . sodium chloride Stopped (09/28/18 1109)  . sodium chloride    . sodium chloride 10 mL/hr at 09/30/18 1031  . dexmedetomidine (PRECEDEX) IV infusion Stopped (09/28/18 0737)  . DOPamine 2.5 mcg/kg/min (10/01/18 0700)  . furosemide (LASIX) infusion 12 mg/hr (10/01/18 0700)  . lactated ringers    . lactated ringers    . lactated ringers Stopped (09/30/18 1012)  . milrinone 0.125 mcg/kg/min (10/01/18 0700)  . nitroGLYCERIN Stopped  (09/27/18 1540)  . phenylephrine (NEO-SYNEPHRINE) Adult infusion Stopped (09/29/18 1552)  . potassium chloride      PRN Medications: sodium chloride, lactated ringers, metoprolol tartrate, midazolam, morphine injection, ondansetron (ZOFRAN) IV, oxyCODONE, sodium  chloride flush, sodium chloride flush, sodium chloride flush, traMADol    Patient Profile   Doris Jefferson is a 77 y.o. female with Chronic diastolic CHF, HTN, CAD, PAF, h/o OSA on CPAP, DM 2, HLD, moderate AS, and obesity.   Asked to see by [facility] for optimization prior to CABG.  Assessment/Plan   1. Severe 3v CAD - Severe with 75% RCA, 100% Cx, 80% LM and LAD 50% stenosed - S/p CABG x 3 with bioprosthetic AVR and LAA clip on 12/20 - Now POD#4 - Swan out. PA systolics were 45-80DXIP before swan pulled.  - Now diuresing. See below. Continue lasix drip @ 12 mg/hr at least for the day.  - Continue dopa 2.5 + milrinone 0.125. Coox 59%  2. Moderate AS - s/p bioprosthetic AVR - wide pulse pressure on exam, seems to be improving 110-120s/50s. - will need echo prior to d/c   3. Acute on chronic diastolic CHF - Echo 38/2/50 LVEF 55-60%, Grade 2 DD, Mild/Mod AS, Mod MR, mild LAE, RV mild dilated, Mild TR, PA peak pressure 79 mm Hg.  - Volume status improving. CVP 7-8. Weight still up 5 lbs from admit weight.  - Continue lasix gtt @ 12 mg/hr at least throughout the day. Can probably stop tonight.   4. 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 Suspect unlikely to benefit from pulmonary vasodilators.  - PA systolics were 53-97QBHA before swan pulled. Continue milrinone 0.125.  - Continue lasix drip @ 12 mg/hr for now.   4. PAF - Maintaining NSR with PACs. - Continue po amio at 400 bid - Resume AC when ok with surgery  6. OSA on CPAP - Continue CPAP. No change.   7. DM 2 - Per TCTS. No  change.   8. AKI - Creatinine 0.9 > 1.7 > 1.9 > 1.89. Suspect ATN. Monitor closely with diuresis.    Length of Stay: [place], NP  10/01/2018, 8:06 AM  Advanced Heart Failure Team Pager [phone] (M-F; 7a - 4p)  Please contact [facility] for night-coverage after hours (4p -7a ) and weekends on [email or web address]  Patient seen and examined with the above-signed Advanced Practice Provider and/or Housestaff. I personally reviewed laboratory data, imaging studies and relevant notes. I independently examined the patient and formulated the important aspects of the plan. I have edited the note to reflect any of my changes or salient points. I have personally discussed the plan with the patient and/or family.  Volume status finally improving. CVP now 8. Weight down to 4-5 pounds within pre-op weight. Creatinine stable. Will decrease lasix gtt. Can stop dopamine. Continue milrinone. Maintaining NSR. Continue po amio. Can start Eliquis when OK with TCTS.   Glori Bickers, MD  4:49 PM

## 2018-10-01 NOTE — Plan of Care (Signed)
  Problem: Education: Goal: Knowledge of the prescribed therapeutic regimen will improve Outcome: Progressing   Problem: Cardiac: Goal: Will achieve and/or maintain hemodynamic stability Outcome: Progressing   Problem: Respiratory: Goal: Respiratory status will improve Outcome: Progressing   Problem: Skin Integrity: Goal: Risk for impaired skin integrity will decrease Outcome: Progressing   Problem: Urinary Elimination: Goal: Ability to achieve and maintain adequate renal perfusion and functioning will improve Outcome: Progressing   Problem: Activity: Goal: Risk for activity intolerance will decrease Outcome: Not Progressing

## 2018-10-02 ENCOUNTER — Inpatient Hospital Stay (HOSPITAL_COMMUNITY): Payer: Medicare Other

## 2018-10-02 LAB — BASIC METABOLIC PANEL
Anion gap: 12 (ref 5–15)
BUN: 37 mg/dL — ABNORMAL HIGH (ref 8–23)
CALCIUM: 8.6 mg/dL — AB (ref 8.9–10.3)
CO2: 26 mmol/L (ref 22–32)
Chloride: 99 mmol/L (ref 98–111)
Creatinine, Ser: 1.67 mg/dL — ABNORMAL HIGH (ref 0.44–1.00)
GFR calc Af Amer: 34 mL/min — ABNORMAL LOW (ref >60)
GFR calc non Af Amer: 29 mL/min — ABNORMAL LOW (ref >60)
Glucose, Bld: 198 mg/dL — ABNORMAL HIGH (ref 70–99)
Potassium: 3.1 mmol/L — ABNORMAL LOW (ref 3.5–5.1)
Sodium: 137 mmol/L (ref 135–145)

## 2018-10-02 LAB — GLUCOSE, CAPILLARY
Glucose-Capillary: 154 mg/dL — ABNORMAL HIGH (ref 70–99)
Glucose-Capillary: 213 mg/dL — ABNORMAL HIGH (ref 70–99)
Glucose-Capillary: 132 mg/dL — ABNORMAL HIGH (ref 70–99)
Glucose-Capillary: 117 mg/dL — ABNORMAL HIGH (ref 70–99)

## 2018-10-02 LAB — CBC
HCT: 35.2 % — ABNORMAL LOW (ref 36.0–46.0)
Hemoglobin: 11.1 g/dL — ABNORMAL LOW (ref 12.0–15.0)
MCH: 28.3 pg (ref 26.0–34.0)
MCHC: 31.5 g/dL (ref 30.0–36.0)
MCV: 89.8 fL (ref 80.0–100.0)
Platelets: 165 10*3/uL (ref 150–400)
RBC: 3.92 MIL/uL (ref 3.87–5.11)
RDW: 14.6 % (ref 11.5–15.5)
WBC: 10.3 10*3/uL (ref 4.0–10.5)
nRBC: 0 % (ref 0.0–0.2)

## 2018-10-02 LAB — COOXEMETRY PANEL
Carboxyhemoglobin: 1.7 % — ABNORMAL HIGH (ref 0.5–1.5)
Methemoglobin: 1.2 % (ref 0.0–1.5)
O2 Saturation: 58.3 %
Total hemoglobin: 12.1 g/dL (ref 12.0–16.0)

## 2018-10-02 MED ORDER — INSULIN DETEMIR 100 UNIT/ML ~~LOC~~ SOLN
25.0000 [IU] | Freq: Every day | SUBCUTANEOUS | Status: DC
  Administered 2018-10-02 – 2018-10-03 (×2): 25 [IU] via SUBCUTANEOUS
  Filled 2018-10-02 (×2): qty 0.25

## 2018-10-02 MED ORDER — DOPAMINE-DEXTROSE 3.2-5 MG/ML-% IV SOLN: 1.5000 ug/kg/min | INTRAVENOUS | Status: DC

## 2018-10-02 MED ORDER — METOPROLOL TARTRATE 25 MG PO TABS
25.0000 mg | ORAL_TABLET | Freq: Two times a day (BID) | ORAL | Status: DC
  Administered 2018-10-02: 25 mg via ORAL
  Filled 2018-10-02: qty 1

## 2018-10-02 MED ORDER — METOPROLOL TARTRATE 25 MG/10 ML ORAL SUSPENSION: 25.0000 mg | Freq: Two times a day (BID) | ORAL | Status: DC

## 2018-10-02 MED ORDER — POTASSIUM CHLORIDE CRYS ER 20 MEQ PO TBCR: 30.0000 meq | EXTENDED_RELEASE_TABLET | Freq: Once | ORAL | Status: DC

## 2018-10-02 MED ORDER — POTASSIUM CHLORIDE 10 MEQ/50ML IV SOLN
10.0000 meq | INTRAVENOUS | Status: AC
  Administered 2018-10-02 (×2): 10 meq via INTRAVENOUS
  Filled 2018-10-02 (×2): qty 50

## 2018-10-02 MED ORDER — FUROSEMIDE 10 MG/ML IJ SOLN: 40.0000 mg | Freq: Two times a day (BID) | INTRAMUSCULAR | Status: DC

## 2018-10-02 MED ORDER — SODIUM CHLORIDE 0.9 % IV SOLN: INTRAVENOUS | Status: AC

## 2018-10-02 MED ORDER — POTASSIUM CHLORIDE 10 MEQ/50ML IV SOLN
10.0000 meq | INTRAVENOUS | Status: AC
  Administered 2018-10-02 (×3): 10 meq via INTRAVENOUS
  Filled 2018-10-02 (×3): qty 50

## 2018-10-02 MED ORDER — SPIRONOLACTONE 12.5 MG HALF TABLET
12.5000 mg | ORAL_TABLET | Freq: Every day | ORAL | Status: DC
  Administered 2018-10-02 – 2018-10-08 (×7): 12.5 mg via ORAL
  Filled 2018-10-02 (×8): qty 1

## 2018-10-02 NOTE — Progress Notes (Signed)
5 Days Post-Op Procedure(s) (LRB): CORONARY ARTERY BYPASS GRAFTING (CABG) x three, using left internal mammary artery and right leg greater saphenous vein harvested endoscopically (N/A) AORTIC VALVE REPLACEMENT (AVR) USING MAGNA EASE SIZE 21 MM (N/A) CLIPPING OF ATRIAL APPENDAGE USING ATRICURE FLEXV SIZE 35MM (N/A) TRANSESOPHAGEAL ECHOCARDIOGRAM (TEE) (N/A) Subjective: Feels a little better today  Objective: Vital signs in last 24 hours: Temp:  [98 F (36.7 C)-99.3 F (37.4 C)] 98.7 F (37.1 C) (12/25 0730) Pulse Rate:  [64-87] 75 (12/25 0730) Cardiac Rhythm: Normal sinus rhythm (12/25 0400) Resp:  [17-26] 19 (12/25 0730) BP: (122-153)/(37-60) 142/53 (12/25 0730) SpO2:  [86 %-100 %] 99 % (12/25 0730) Weight:  [93.8 kg] 93.8 kg (12/25 0500)  Hemodynamic parameters for last 24 hours: CVP:  [5 mmHg-8 mmHg] 7 mmHg  Intake/Output from previous day: 12/24 0701 - 12/25 0700 In: 9244 [P.O.:1040; I.V.:360.3; IV Piggyback:196.7] Out: 5475 [Urine:5475] Intake/Output this shift: No intake/output data recorded.  General appearance: alert, cooperative and no distress Neurologic: intact Heart: regular rate and rhythm Lungs: diminished breath sounds bibasilar Wound: VAC in place  Lab Results: Recent Labs    10/01/18 0359 10/02/18 0408  WBC 12.6* 10.3  HGB 10.3* 11.1*  HCT 33.0* 35.2*  PLT 124* 165   BMET:  Recent Labs    10/01/18 0359 10/02/18 0408  NA 136 137  K 3.6 3.1*  CL 104 99  CO2 22 26  GLUCOSE 191* 198*  BUN 36* 37*  CREATININE 1.89* 1.67*  CALCIUM 8.3* 8.6*    PT/INR: No results for input(s): LABPROT, INR in the last 72 hours. ABG    Component Value Date/Time   PHART 7.345 (L) 09/28/2018 1109   HCO3 21.5 09/28/2018 1109   TCO2 23 09/28/2018 1629   ACIDBASEDEF 4.0 (H) 09/28/2018 1109   O2SAT 58.3 10/02/2018 0415   CBG (last 3)  Recent Labs    10/01/18 1941 10/01/18 2208 10/02/18 0644  GLUCAP 146* 131* 154*    Assessment/Plan: S/P Procedure(s)  (LRB): CORONARY ARTERY BYPASS GRAFTING (CABG) x three, using left internal mammary artery and right leg greater saphenous vein harvested endoscopically (N/A) AORTIC VALVE REPLACEMENT (AVR) USING MAGNA EASE SIZE 21 MM (N/A) CLIPPING OF ATRIAL APPENDAGE USING ATRICURE FLEXV SIZE 35MM (N/A) TRANSESOPHAGEAL ECHOCARDIOGRAM (TEE) (N/A) -  CV- stable, co-ox 58, dc dopamine, continue milrinone  Increase lopressor to home dose RESP- continue IS RENAL- creatinine improved and 4 L negative over past 24 hours  Dc lasix drip- change to IV BID  Hypokalemia- supplement ENDO_ CBG elevated- increase levemir Thrombocytopenia- stable Deconditioning - severe, Continue PT   LOS: 8 days    Doris Jefferson 10/02/2018

## 2018-10-02 NOTE — Progress Notes (Addendum)
Advanced Heart Failure Rounding Note  PCP-Cardiologist: Sinclair Grooms, MD   Subjective:    S/p CABG x 3 with bioprosthetic AVR and LAA clip on 12/20  Lasix gtt switch to 40 IV bid. Diuresing well. Creatinine improved to 1.6. K 3.1. Weight down 7 pounds. About 3 pounds from pre-op weight.    On milrinone 0.125. Co-ox 58%. Remains in NSR on po amiodarone.   Feels better. Denies SOB, orthopnea or PND  Objective:   Weight Range: 93.8 kg Body mass index is 36.63 kg/m.   Vital Signs:   Temp:  [98 F (36.7 C)-99.3 F (37.4 C)] 98.7 F (37.1 C) (12/25 0730) Pulse Rate:  [64-87] 74 (12/25 0800) Resp:  [16-26] 16 (12/25 0800) BP: (122-153)/(44-61) 131/61 (12/25 0800) SpO2:  [86 %-100 %] 97 % (12/25 0800) Weight:  [93.8 kg] 93.8 kg (12/25 0500) Last BM Date: (PTA)  Weight change: Filed Weights   09/30/18 0500 10/01/18 0700 10/02/18 0500  Weight: 100.9 kg 97 kg 93.8 kg    Intake/Output:   Intake/Output Summary (Last 24 hours) at 10/02/2018 1019 Last data filed at 10/02/2018 0800 Gross per 24 hour  Intake 1268.41 ml  Output 4675 ml  Net -3406.59 ml      Physical Exam    General:  Lying in bed  No resp difficulty HEENT: normal Neck: supple. no JVD. Carotids 2+ bilat; no bruits. No lymphadenopathy or thryomegaly appreciated. Cor: Sternal wound ok PMI nondisplaced. Regular rate & rhythm. No rubs, gallops or murmurs. Lungs: clear Abdomen: obese soft, nontender, nondistended. No hepatosplenomegaly. No bruits or masses. Good bowel sounds. Extremities: no cyanosis, clubbing, rash, 1+ edema L> R + RUE picc Neuro: alert & orientedx3, cranial nerves grossly intact. moves all 4 extremities w/o difficulty. Affect pleasant   Telemetry   NSR 70s with occ PACs. Personally reviewed.   EKG    No new tracings.    Labs    CBC Recent Labs    10/01/18 0359 10/02/18 0408  WBC 12.6* 10.3  HGB 10.3* 11.1*  HCT 33.0* 35.2*  MCV 92.2 89.8  PLT 124* 638   Basic  Metabolic Panel Recent Labs    10/01/18 0359 10/02/18 0408  NA 136 137  K 3.6 3.1*  CL 104 99  CO2 22 26  GLUCOSE 191* 198*  BUN 36* 37*  CREATININE 1.89* 1.67*  CALCIUM 8.3* 8.6*   Liver Function Tests Recent Labs    09/30/18 0533  AST 30  ALT 53*  ALKPHOS 58  BILITOT 0.7  PROT 5.0*  ALBUMIN 2.4*   No results for input(s): LIPASE, AMYLASE in the last 72 hours. Cardiac Enzymes No results for input(s): CKTOTAL, CKMB, CKMBINDEX, TROPONINI in the last 72 hours.  BNP: BNP (last 3 results) No results for input(s): BNP in the last 8760 hours.  ProBNP (last 3 results) No results for input(s): PROBNP in the last 8760 hours.   D-Dimer No results for input(s): DDIMER in the last 72 hours. Hemoglobin A1C No results for input(s): HGBA1C in the last 72 hours. Fasting Lipid Panel No results for input(s): CHOL, HDL, LDLCALC, TRIG, CHOLHDL, LDLDIRECT in the last 72 hours. Thyroid Function Tests No results for input(s): TSH, T4TOTAL, T3FREE, THYROIDAB in the last 72 hours.  Invalid input(s): FREET3  Other results:   Imaging    Dg Chest Port 1 View  Result Date: 10/02/2018 CLINICAL DATA:  Chest tube EXAM: PORTABLE CHEST 1 VIEW COMPARISON:  10/01/2018 FINDINGS: 0633 hours. Low lung volumes.  The cardio pericardial silhouette is enlarged. Bibasilar atelectasis/infiltrate, left greater than right, is similar to prior. No overt pulmonary edema. Status post CABG. The visualized bony structures of the thorax are intact. PICC line tip projects at the proximal SVC level. Telemetry leads overlie the chest. IMPRESSION: Low volume film with cardiomegaly and bibasilar and left mid lung collapse/consolidation, similar to prior. Electronically Signed   By: Misty Stanley M.D.   On: 10/02/2018 08:39     Medications:     Scheduled Medications: . acetaminophen  1,000 mg Oral Q6H   Or  . acetaminophen (TYLENOL) oral liquid 160 mg/5 mL  1,000 mg Per Tube Q6H  . amiodarone  400 mg Oral  BID  . aspirin EC  81 mg Oral Daily   Or  . aspirin  81 mg Per Tube Daily  . bisacodyl  10 mg Oral Daily   Or  . bisacodyl  10 mg Rectal Daily  . buPROPion  300 mg Oral Daily  . Chlorhexidine Gluconate Cloth  6 each Topical Daily  . docusate sodium  200 mg Oral Daily  . enoxaparin (LOVENOX) injection  30 mg Subcutaneous QHS  . furosemide  40 mg Intravenous BID  . insulin aspart  0-15 Units Subcutaneous TID WC  . insulin aspart  0-5 Units Subcutaneous QHS  . insulin detemir  25 Units Subcutaneous Daily  . loratadine  10 mg Oral Daily  . mouth rinse  15 mL Mouth Rinse BID  . metoprolol tartrate  25 mg Oral BID   Or  . metoprolol tartrate  25 mg Per Tube BID  . pantoprazole  40 mg Oral Daily  . potassium chloride SA  20 mEq Oral Daily  . rosuvastatin  20 mg Oral Daily  . sodium chloride flush  10-40 mL Intracatheter Q12H  . sodium chloride flush  3 mL Intravenous Q12H    Infusions: . sodium chloride Stopped (09/28/18 1109)  . sodium chloride    . sodium chloride 10 mL/hr at 09/30/18 1031  . dexmedetomidine (PRECEDEX) IV infusion Stopped (09/28/18 0737)  . lactated ringers    . lactated ringers    . lactated ringers Stopped (09/30/18 1012)  . milrinone 0.125 mcg/kg/min (10/02/18 0800)  . nitroGLYCERIN Stopped (09/27/18 1540)  . phenylephrine (NEO-SYNEPHRINE) Adult infusion Stopped (09/29/18 1552)  . potassium chloride      PRN Medications: sodium chloride, lactated ringers, metoprolol tartrate, midazolam, morphine injection, ondansetron (ZOFRAN) IV, oxyCODONE, sodium chloride flush, sodium chloride flush, traMADol    Patient Profile   Doris Jefferson is a 77 y.o. female with Chronic diastolic CHF, HTN, CAD, PAF, h/o OSA on CPAP, DM 2, HLD, moderate AS, and obesity.   Asked to see by [facility] for optimization prior to CABG.  Assessment/Plan   1. Severe 3v CAD - Severe with 75% RCA, 100% Cx, 80% LM and LAD 50% stenosed - S/p CABG x 3 with bioprosthetic AVR and LAA clip  on 12/20 - Now POD#5. Doing well. - CR on board  2. Moderate AS - s/p bioprosthetic AVR - wide pulse pressure on exam, seems to be improving 110-120s/50s. - will need echo prior to d/c. Will order for tomorrow   3. Acute on chronic diastolic CHF - Echo 52/8/41 LVEF 55-60%, Grade 2 DD, Mild/Mod AS, Mod MR, mild LAE, RV mild dilated, Mild TR, PA peak pressure 79 mm Hg.  - Volume status improving well. ATN resolving.  Only 1.5kg up from pre-op weight.  Continue lasix 40 IV bid for  one more day Weight still up 5 lbs from admit weight.  - Supp K  - start spiro 12.5 - place TED hose  4. 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 Suspect unlikely to benefit from pulmonary vasodilators.  - PA systolics were 60-45WUJW before swan pulled. Continue milrinone 0.125 for now. Stop metoprolol.   4. PAF - Maintaining NSR with PACs. - Continue po amio at 400 bid - I d/w TCTS. Will wait one more day to restart Eliquis. D/w PharmD  6. OSA on CPAP - Continue CPAP. No change.   7. DM 2 - Per TCTS. No change.   8. AKI - Creatinine 0.9 > 1.7 > 1.9 > 1.89 > 1.67. Suspect ATN. Monitor closely with diuresis.   9. Hypokalemia - will supp  Can got to 4E from my perspective.   Length of Stay: [place], MD  10/02/2018, 10:19 AM  Advanced Heart Failure Team Pager [phone] (M-F; 7a - 4p)  Please contact [facility] for night-coverage after hours (4p -7a ) and weekends on [email or web address]

## 2018-10-02 NOTE — Progress Notes (Signed)
Attempts at mobility this morning poorly tolerated. Pt able to stand in place and do some marching, but becomes diaphoretic and weak, and has difficulty supporting her weight without significant assistance. Standing at bedside, transferring bed/chair/bsc causes HR elevation to 110's. Pt states she is "exhausted" with minimal movements. Encouraged continued progress towards baseline mobility and explained importance r/t recovery.

## 2018-10-02 NOTE — Progress Notes (Signed)
      [address]       [place] [address]             [phone]      Sleeping presently  BP low this evening- 500 ml bolus, hold diuretics for now  Remo Lipps C. Roxan Hockey, MD [facility] [phone]

## 2018-10-02 NOTE — Progress Notes (Signed)
Pt BP soft, systolics 75-44B. Reports dizziness on standing. Notified Dr. Roxan Hockey and [place]. Starting 500cc bolus per [place].

## 2018-10-02 NOTE — Progress Notes (Signed)
Pt found standing at doorway to bathroom, having a bm. Pt diaphoretic, weak. Educated on fall precautions. Back to bed. Yellow socks placed, bed alarm on.

## 2018-10-03 ENCOUNTER — Inpatient Hospital Stay (HOSPITAL_COMMUNITY): Payer: Medicare Other

## 2018-10-03 DIAGNOSIS — I361 Nonrheumatic tricuspid (valve) insufficiency: Secondary | ICD-10-CM

## 2018-10-03 DIAGNOSIS — I480 Paroxysmal atrial fibrillation: Secondary | ICD-10-CM

## 2018-10-03 LAB — BASIC METABOLIC PANEL
ANION GAP: 12 (ref 5–15)
BUN: 44 mg/dL — AB (ref 8–23)
CO2: 24 mmol/L (ref 22–32)
Calcium: 8.5 mg/dL — ABNORMAL LOW (ref 8.9–10.3)
Chloride: 104 mmol/L (ref 98–111)
Creatinine, Ser: 1.66 mg/dL — ABNORMAL HIGH (ref 0.44–1.00)
GFR calc Af Amer: 34 mL/min — ABNORMAL LOW (ref >60)
GFR calc non Af Amer: 29 mL/min — ABNORMAL LOW (ref >60)
GLUCOSE: 70 mg/dL (ref 70–99)
POTASSIUM: 3.3 mmol/L — AB (ref 3.5–5.1)
Sodium: 140 mmol/L (ref 135–145)

## 2018-10-03 LAB — CBC
HCT: 35 % — ABNORMAL LOW (ref 36.0–46.0)
Hemoglobin: 11.1 g/dL — ABNORMAL LOW (ref 12.0–15.0)
MCH: 28.6 pg (ref 26.0–34.0)
MCHC: 31.7 g/dL (ref 30.0–36.0)
MCV: 90.2 fL (ref 80.0–100.0)
Platelets: 219 10*3/uL (ref 150–400)
RBC: 3.88 MIL/uL (ref 3.87–5.11)
RDW: 14.6 % (ref 11.5–15.5)
WBC: 12.9 10*3/uL — ABNORMAL HIGH (ref 4.0–10.5)
nRBC: 0 % (ref 0.0–0.2)

## 2018-10-03 LAB — GLUCOSE, CAPILLARY
GLUCOSE-CAPILLARY: 78 mg/dL (ref 70–99)
Glucose-Capillary: 73 mg/dL (ref 70–99)
Glucose-Capillary: 137 mg/dL — ABNORMAL HIGH (ref 70–99)
Glucose-Capillary: 57 mg/dL — ABNORMAL LOW (ref 70–99)
Glucose-Capillary: 121 mg/dL — ABNORMAL HIGH (ref 70–99)

## 2018-10-03 LAB — COOXEMETRY PANEL
Carboxyhemoglobin: 1.2 % (ref 0.5–1.5)
Methemoglobin: 1.7 % — ABNORMAL HIGH (ref 0.0–1.5)
O2 Saturation: 49.7 %
Total hemoglobin: 11.6 g/dL — ABNORMAL LOW (ref 12.0–16.0)

## 2018-10-03 LAB — ECHOCARDIOGRAM COMPLETE
Height: 63 in
Weight: 3319.25 oz

## 2018-10-03 MED ORDER — POTASSIUM CHLORIDE 10 MEQ/50ML IV SOLN
10.0000 meq | INTRAVENOUS | Status: DC | PRN
  Filled 2018-10-03 (×3): qty 50

## 2018-10-03 MED ORDER — POTASSIUM CHLORIDE 10 MEQ/50ML IV SOLN
10.0000 meq | INTRAVENOUS | Status: AC
  Administered 2018-10-03 (×3): 10 meq via INTRAVENOUS

## 2018-10-03 MED ORDER — POTASSIUM CHLORIDE CRYS ER 20 MEQ PO TBCR
40.0000 meq | EXTENDED_RELEASE_TABLET | Freq: Every day | ORAL | Status: AC
  Administered 2018-10-03: 40 meq via ORAL
  Filled 2018-10-03: qty 2

## 2018-10-03 MED ORDER — APIXABAN 5 MG PO TABS
5.0000 mg | ORAL_TABLET | Freq: Two times a day (BID) | ORAL | Status: DC
  Administered 2018-10-03 – 2018-10-09 (×12): 5 mg via ORAL
  Filled 2018-10-03 (×13): qty 1

## 2018-10-03 MED ORDER — INSULIN DETEMIR 100 UNIT/ML ~~LOC~~ SOLN
15.0000 [IU] | Freq: Every day | SUBCUTANEOUS | Status: DC
  Filled 2018-10-03: qty 0.15

## 2018-10-03 MED ORDER — POTASSIUM CHLORIDE CRYS ER 20 MEQ PO TBCR
20.0000 meq | EXTENDED_RELEASE_TABLET | Freq: Every day | ORAL | Status: DC
  Administered 2018-10-04 – 2018-10-07 (×4): 20 meq via ORAL
  Filled 2018-10-03 (×5): qty 1

## 2018-10-03 MED FILL — Magnesium Sulfate Inj 50%: INTRAMUSCULAR | Qty: 10 | Status: AC

## 2018-10-03 MED FILL — Potassium Chloride Inj 2 mEq/ML: INTRAVENOUS | Qty: 40 | Status: AC

## 2018-10-03 MED FILL — Heparin Sodium (Porcine) Inj 1000 Unit/ML: INTRAMUSCULAR | Qty: 30 | Status: AC

## 2018-10-03 NOTE — Progress Notes (Addendum)
Advanced Heart Failure Rounding Note  PCP-Cardiologist: Sinclair Grooms, MD   Subjective:    S/p CABG x 3 with bioprosthetic AVR and LAA clip on 12/20  Required 500 cc bolus yesterday with soft BPs. Diuretics on hold. Creatinine 1.66 today. K 3.3. Weight up 1 lb, now at pre-op weight.   On milrinone 0.125. Co-ox 50%. Back in Afib this morning 90-100s on po amiodarone. CVP 3 on RN check this am. + orthostatics this am with drop after standing, recovered after 3 minutes.   Up in chair this morning. Denies CP or SOB. Feels weak.   Objective:   Weight Range: 94.1 kg Body mass index is 36.75 kg/m.   Vital Signs:   Temp:  [97.9 F (36.6 C)-98.2 F (36.8 C)] 98 F (36.7 C) (12/26 0700) Pulse Rate:  [59-110] 96 (12/26 0700) Resp:  [14-26] 17 (12/26 0700) BP: (91-137)/(41-67) 95/67 (12/26 0700) SpO2:  [93 %-100 %] 100 % (12/26 0700) Weight:  [94.1 kg] 94.1 kg (12/26 0500) Last BM Date: (10/02/2018 )  Weight change: Filed Weights   10/01/18 0700 10/02/18 0500 10/03/18 0500  Weight: 97 kg 93.8 kg 94.1 kg    Intake/Output:   Intake/Output Summary (Last 24 hours) at 10/03/2018 0802 Last data filed at 10/03/2018 0700 Gross per 24 hour  Intake 532.37 ml  Output 1300 ml  Net -767.63 ml      Physical Exam    General: Sitting in chair. No resp difficulty. HEENT: Normal Neck: Supple. No JVD. Carotids 2+ bilat; no bruits. No thyromegaly or nodule noted. Cor: PMI nondisplaced. IRR, No M/G/R noted. Sternal wound CDI.  Lungs: CTAB, normal effort. Abdomen: Soft, non-tender, non-distended, no HSM. No bruits or masses. +BS  Extremities: No cyanosis, clubbing, or rash. R and LLE trace edema. RUE PICC. BLE TED hose.  Neuro: Alert & orientedx3, cranial nerves grossly intact. moves all 4 extremities w/o difficulty. Affect pleasant  Telemetry   Afib 90-100s. Personally reviewed.   EKG    No new tracings.    Labs    CBC Recent Labs    10/02/18 0408 10/03/18 0456    WBC 10.3 12.9*  HGB 11.1* 11.1*  HCT 35.2* 35.0*  MCV 89.8 90.2  PLT 165 656   Basic Metabolic Panel Recent Labs    10/02/18 0408 10/03/18 0456  NA 137 140  K 3.1* 3.3*  CL 99 104  CO2 26 24  GLUCOSE 198* 70  BUN 37* 44*  CREATININE 1.67* 1.66*  CALCIUM 8.6* 8.5*   Liver Function Tests No results for input(s): AST, ALT, ALKPHOS, BILITOT, PROT, ALBUMIN in the last 72 hours. No results for input(s): LIPASE, AMYLASE in the last 72 hours. Cardiac Enzymes No results for input(s): CKTOTAL, CKMB, CKMBINDEX, TROPONINI in the last 72 hours.  BNP: BNP (last 3 results) No results for input(s): BNP in the last 8760 hours.  ProBNP (last 3 results) No results for input(s): PROBNP in the last 8760 hours.   D-Dimer No results for input(s): DDIMER in the last 72 hours. Hemoglobin A1C No results for input(s): HGBA1C in the last 72 hours. Fasting Lipid Panel No results for input(s): CHOL, HDL, LDLCALC, TRIG, CHOLHDL, LDLDIRECT in the last 72 hours. Thyroid Function Tests No results for input(s): TSH, T4TOTAL, T3FREE, THYROIDAB in the last 72 hours.  Invalid input(s): FREET3  Other results:   Imaging    No results found.   Medications:     Scheduled Medications: . amiodarone  400 mg Oral  BID  . aspirin EC  81 mg Oral Daily   Or  . aspirin  81 mg Per Tube Daily  . bisacodyl  10 mg Oral Daily   Or  . bisacodyl  10 mg Rectal Daily  . buPROPion  300 mg Oral Daily  . Chlorhexidine Gluconate Cloth  6 each Topical Daily  . docusate sodium  200 mg Oral Daily  . enoxaparin (LOVENOX) injection  30 mg Subcutaneous QHS  . insulin aspart  0-15 Units Subcutaneous TID WC  . insulin aspart  0-5 Units Subcutaneous QHS  . insulin detemir  25 Units Subcutaneous Daily  . loratadine  10 mg Oral Daily  . mouth rinse  15 mL Mouth Rinse BID  . pantoprazole  40 mg Oral Daily  . [START ON 10/04/2018] potassium chloride  20 mEq Oral Daily  . potassium chloride SA  40 mEq Oral Daily  .  rosuvastatin  20 mg Oral Daily  . sodium chloride flush  10-40 mL Intracatheter Q12H  . sodium chloride flush  3 mL Intravenous Q12H  . spironolactone  12.5 mg Oral Daily    Infusions: . sodium chloride Stopped (09/28/18 1109)  . sodium chloride    . sodium chloride 10 mL/hr at 09/30/18 1031  . dexmedetomidine (PRECEDEX) IV infusion Stopped (09/28/18 0737)  . lactated ringers    . lactated ringers    . lactated ringers Stopped (09/30/18 1012)  . milrinone Stopped (10/03/18 0626)  . nitroGLYCERIN Stopped (09/27/18 1540)  . phenylephrine (NEO-SYNEPHRINE) Adult infusion Stopped (09/29/18 1552)  . potassium chloride 10 mEq (10/03/18 0706)    PRN Medications: sodium chloride, lactated ringers, metoprolol tartrate, midazolam, morphine injection, ondansetron (ZOFRAN) IV, oxyCODONE, sodium chloride flush, sodium chloride flush, traMADol    Patient Profile   Doris Jefferson is a 77 y.o. female with Chronic diastolic CHF, HTN, CAD, PAF, h/o OSA on CPAP, DM 2, HLD, moderate AS, and obesity.   Asked to see by [facility] for optimization prior to CABG.  Assessment/Plan   1. Severe 3v CAD - Severe with 75% RCA, 100% Cx, 80% LM and LAD 50% stenosed - S/p CABG x 3 with bioprosthetic AVR and LAA clip on 12/20 - Now POD#6. Doing well. - CR on board  2. Moderate AS - s/p bioprosthetic AVR - wide pulse pressure, seems to be improving.  - Ordered echo today.    3. Acute on chronic diastolic CHF - Echo 94/8/54 LVEF 55-60%, Grade 2 DD, Mild/Mod AS, Mod MR, mild LAE, RV mild dilated, Mild TR, PA peak pressure 79 mm Hg.  - Coox 50% this am, likely in setting of recurrent Afib. Milrinone increased by Dr [name] to 0.25 mcg/kg/min.  - Volume status low. CVP 3 and +orthostatics this morning. Required 500 cc bolus yesterday. SBP 110-140s today. - Hold diuretics again today. Encourage PO intake. - Continue spiro 12.5 mg daily. Consider increasing tomorrow if euvolemic.  - Continue TED hose  4.  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 Suspect unlikely to benefit from pulmonary vasodilators.  - PA systolics were [id number] before swan pulled. Continue milrinone 0.125 for now. Metoprolol on hold.  4. PAF - Back in Afib this morning.  - Continue po amio at 400 bid. Consider switching back to IV. - Restart Eliquis tonight. Discussed with pharmD.  6. OSA on CPAP -  Continue CPAP. No change.   7. DM 2 - Per TCTS. No change.   8. AKI - Creatinine 0.9 > 1.7 > 1.9 > 1.89 > 1.67 > 1.66. Suspect ATN. Monitor closely with diuresis.   9. Hypokalemia - K 3.3. Supp ordered.  Can got to 4E from HF perspective.   Length of Stay: [id number], [place]  10/03/2018, 8:02 AM  Advanced Heart Failure Team Pager [phone] (M-F; 7a - 4p)  Please contact [facility] for night-coverage after hours (4p -7a ) and weekends on [email or web address]   Patient seen and examined with the above-signed Advanced Practice Provider and/or Housestaff. I personally reviewed laboratory data, imaging studies and relevant notes. I independently examined the patient and formulated the important aspects of the plan. I have edited the note to reflect any of my changes or salient points. I have personally discussed the plan with the patient and/or family.  Received 500cc NS yesterday for low BP. BP stil soft. CVp 3. Mildly orthostatic. Back in AF today as well. Co-ox low at 50%. Milrinone increased by TCTS.   Suspect low co-ox mostly due to volume depletion and AF. Will continue amio. Hold diuretics. Follow co-ox and CVP. Hopefully can start milrinone wean tomorrow.   Will start Eliquis tonight after pacing wires pulled. D/w TCTS and PharmD personally.   Supp K.  Glori Bickers, MD  10:14 AM

## 2018-10-03 NOTE — Progress Notes (Addendum)
TCTS DAILY ICU PROGRESS NOTE                   [address]            [place] [address]          [phone]   6 Days Post-Op Procedure(s) (LRB): CORONARY ARTERY BYPASS GRAFTING (CABG) x three, using left internal mammary artery and right leg greater saphenous vein harvested endoscopically (N/A) AORTIC VALVE REPLACEMENT (AVR) USING MAGNA EASE SIZE 21 MM (N/A) CLIPPING OF ATRIAL APPENDAGE USING ATRICURE FLEXV SIZE 35MM (N/A) TRANSESOPHAGEAL ECHOCARDIOGRAM (TEE) (N/A)  Total Length of Stay:  LOS: 9 days   Subjective: Patient states had loose stools yesterday. She does not like the food, but has no other complaints this am.  Objective: Vital signs in last 24 hours: Temp:  [97.9 F (36.6 C)-98.2 F (36.8 C)] 98 F (36.7 C) (12/26 0700) Pulse Rate:  [59-110] 96 (12/26 0700) Cardiac Rhythm: Atrial fibrillation (12/26 0400) Resp:  [14-26] 17 (12/26 0700) BP: (91-137)/(41-67) 95/67 (12/26 0700) SpO2:  [93 %-100 %] 100 % (12/26 0700) Weight:  [94.1 kg] 94.1 kg (12/26 0500)  Filed Weights   10/01/18 0700 10/02/18 0500 10/03/18 0500  Weight: 97 kg 93.8 kg 94.1 kg    Weight change: 0.3 kg   Hemodynamic parameters for last 24 hours: CVP:  [3 mmHg-9 mmHg] 9 mmHg  Intake/Output from previous day: 12/25 0701 - 12/26 0700 In: 548.4 [P.O.:240; I.V.:108.4; IV Piggyback:200] Out: 1300 [Urine:1300]  Intake/Output this shift: No intake/output data recorded.  Current Meds: Scheduled Meds: . amiodarone  400 mg Oral BID  . aspirin EC  81 mg Oral Daily   Or  . aspirin  81 mg Per Tube Daily  . bisacodyl  10 mg Oral Daily   Or  . bisacodyl  10 mg Rectal Daily  . buPROPion  300 mg Oral Daily  . Chlorhexidine Gluconate Cloth  6 each Topical Daily  . docusate sodium  200 mg Oral Daily  . enoxaparin (LOVENOX) injection  30 mg Subcutaneous QHS  . insulin aspart  0-15 Units Subcutaneous TID WC  . insulin aspart  0-5 Units Subcutaneous QHS  . insulin detemir  25 Units  Subcutaneous Daily  . loratadine  10 mg Oral Daily  . mouth rinse  15 mL Mouth Rinse BID  . pantoprazole  40 mg Oral Daily  . potassium chloride SA  20 mEq Oral Daily  . rosuvastatin  20 mg Oral Daily  . sodium chloride flush  10-40 mL Intracatheter Q12H  . sodium chloride flush  3 mL Intravenous Q12H  . spironolactone  12.5 mg Oral Daily   Continuous Infusions: . sodium chloride Stopped (09/28/18 1109)  . sodium chloride    . sodium chloride 10 mL/hr at 09/30/18 1031  . dexmedetomidine (PRECEDEX) IV infusion Stopped (09/28/18 0737)  . lactated ringers    . lactated ringers    . lactated ringers Stopped (09/30/18 1012)  . milrinone Stopped (10/03/18 4034)  . nitroGLYCERIN Stopped (09/27/18 1540)  . phenylephrine (NEO-SYNEPHRINE) Adult infusion Stopped (09/29/18 1552)  . potassium chloride 10 mEq (10/03/18 0706)   PRN Meds:.sodium chloride, lactated ringers, metoprolol tartrate, midazolam, morphine injection, ondansetron (ZOFRAN) IV, oxyCODONE, sodium chloride flush, sodium chloride flush, traMADol  General appearance: alert, cooperative and no distress Neurologic: intact Heart: IRRR IRRR Lungs: Slightly diminished at bases Abdomen: Soft, obese, non tender bowel sounds present Extremities: Trace LE edema Wound: Preveena wound vac on sternum. RLE wound  clean and dry  Lab Results: CBC: Recent Labs    10/02/18 0408 10/03/18 0456  WBC 10.3 12.9*  HGB 11.1* 11.1*  HCT 35.2* 35.0*  PLT 165 219   BMET:  Recent Labs    10/02/18 0408 10/03/18 0456  NA 137 140  K 3.1* 3.3*  CL 99 104  CO2 26 24  GLUCOSE 198* 70  BUN 37* 44*  CREATININE 1.67* 1.66*  CALCIUM 8.6* 8.5*    CMET: Lab Results  Component Value Date   WBC 12.9 (H) 10/03/2018   HGB 11.1 (L) 10/03/2018   HCT 35.0 (L) 10/03/2018   PLT 219 10/03/2018   GLUCOSE 70 10/03/2018   CHOL 145 05/08/2018   TRIG 152 (H) 05/08/2018   HDL 43 05/08/2018   LDLCALC 72 05/08/2018   ALT 53 (H) 09/30/2018   AST 30  09/30/2018   NA 140 10/03/2018   K 3.3 (L) 10/03/2018   CL 104 10/03/2018   CREATININE 1.66 (H) 10/03/2018   BUN 44 (H) 10/03/2018   CO2 24 10/03/2018   TSH 3.000 05/08/2018   INR 1.47 09/27/2018    PT/INR: No results for input(s): LABPROT, INR in the last 72 hours. Radiology: No results found.   Assessment/Plan: S/P Procedure(s) (LRB): CORONARY ARTERY BYPASS GRAFTING (CABG) x three, using left internal mammary artery and right leg greater saphenous vein harvested endoscopically (N/A) AORTIC VALVE REPLACEMENT (AVR) USING MAGNA EASE SIZE 21 MM (N/A) CLIPPING OF ATRIAL APPENDAGE USING ATRICURE FLEXV SIZE 35MM (N/A) TRANSESOPHAGEAL ECHOCARDIOGRAM (TEE) (N/A)  1. CV-A fib with HR into low 100's at times. On Amiodarone 400 mg bid, Spironolactone 12.5 mg daily, and Milrinone drip (0.125) . Co ox this am decreased to 49.7-as discussed with Dr. [name], will increase Milrinone drip. Per heart failure, echo to be done prior to discharge. 2. Pulmonary-She has moderate pulmonary hypertension-Lopressor stopped yesterday. On 2 liters of oxygen via [place]. Will wean as able over next few days. Encourage incentive spirometer 3. Volume overload-previously on Lasix drip.  4. ABL anemia- H and H stable at 11.1 and 35 this am 5. DM-CBGs 132/117/73. On Insulin. Will check HGA1C  6. AKI-Likely etiology is ATN.Creatinine this am 1.66. Will remove foley 7. Supplement potassium 8. Deconditioning-continue PT 9. Stop stool softeners  Donielle Liston Alba PA-C 10/03/2018 7:40 AM   Now in afib, likely why cox dropped, already on Cordarone  Diuretic phase of atn, got extra fluid yesterday D/c pacing wires and start noac  Needs pt

## 2018-10-03 NOTE — Progress Notes (Signed)
Physical Therapy Treatment Patient Details Name: Doris Jefferson MRN: [id number] DOB: 04-23-1941 Today's Date: 10/03/2018    History of Present Illness patient is a 77 y.o. female with Chronic diastolic CHF, HTN, CAD, PAF, h/o OSA on CPAP, DM 2, HLD, moderate AS, and obesity. S/p CABG x 3 with bioprosthetic AVR and LAA clip on 12/20    PT Comments    Patient making good progress towards her physical therapy goals this session. Performing all mobility at a min assist level. Ambulating 30 feet x 2 with front wheeled walker and seated rest break. HR peak at 116 bpm. Discussed discharge recommendation of SNF with pt and pt brother; both verbalized understanding. Will continue to progress as tolerated.  Prior to mobility: 125/57 Post mobility: 144/61   Follow Up Recommendations  SNF;Supervision for mobility/OOB     Equipment Recommendations  (rollator)    Recommendations for Other Services       Precautions / Restrictions Precautions Precautions: Fall Precaution Comments: sternal; wound vac in place Restrictions Weight Bearing Restrictions: Yes RUE Weight Bearing: Partial weight bearing RUE Partial Weight Bearing Percentage or Pounds: 5 pounds    Mobility  Bed Mobility               General bed mobility comments: OOB in chair  Transfers Overall transfer level: Needs assistance Equipment used: Rolling walker (2 wheeled) Transfers: Sit to/from Stand Sit to Stand: Min assist         General transfer comment: max cues for maintaining sternal precautions. use of momentum to achieve upright  Ambulation/Gait Ambulation/Gait assistance: Min assist Gait Distance (Feet): 30 Feet(x2) Assistive device: Rolling walker (2 wheeled) Gait Pattern/deviations: Step-through pattern;Decreased stride length;Narrow base of support Gait velocity: decreased Gait velocity interpretation: <1.31 ft/sec, indicative of household ambulator General Gait Details: cues for wider base of  support. chair follow utilized   Marine scientist [place] (Stroke Patients Only)       Balance Overall balance assessment: Needs assistance Sitting-balance support: Feet supported Sitting balance-Leahy Scale: Good     Standing balance support: During functional activity;Bilateral upper extremity supported Standing balance-Leahy Scale: Poor                              Cognition Arousal/Alertness: Awake/alert Behavior During Therapy: Flat affect Overall Cognitive Status: Impaired/Different from baseline Area of Impairment: Attention;Memory;Awareness;Safety/judgement;Following commands;Problem solving                   Current Attention Level: Selective Memory: Decreased recall of precautions Following Commands: Follows one step commands consistently   Awareness: Emergent Problem Solving: Slow processing;Requires verbal cues        Exercises General Exercises - Lower Extremity Long Arc Quad: 20 reps;Both;Seated Hip Flexion/Marching: 20 reps;Both;Seated Heel Raises: 20 reps;Both;Seated    General Comments        Pertinent Vitals/Pain Pain Assessment: No/denies pain    Home Living                      Prior Function            PT Goals (current goals can now be found in the care plan section) Acute Rehab PT Goals Patient Stated Goal: to get better PT Goal Formulation: With patient/family Time For Goal Achievement: 10/14/18 Potential to Achieve Goals: Good Progress towards PT goals: Progressing toward  goals    Frequency    Min 2X/week      PT Plan Current plan remains appropriate    Co-evaluation              AM-PAC PT "6 Clicks" Mobility   Outcome Measure  Help needed turning from your back to your side while in a flat bed without using bedrails?: A Lot Help needed moving from lying on your back to sitting on the side of a flat bed without using bedrails?: A Lot Help  needed moving to and from a bed to a chair (including a wheelchair)?: A Little Help needed standing up from a chair using your arms (e.g., wheelchair or bedside chair)?: A Little Help needed to walk in hospital room?: A Little Help needed climbing 3-5 steps with a railing? : A Lot 6 Click Score: 15    End of Session Equipment Utilized During Treatment: Gait belt Activity Tolerance: Patient tolerated treatment well Patient left: with call bell/phone within reach;with family/visitor present;in chair Nurse Communication: Mobility status PT Visit Diagnosis: Difficulty in walking, not elsewhere classified (R26.2);Muscle weakness (generalized) (M62.81)     Time: 2585-2778 PT Time Calculation (min) (ACUTE ONLY): 27 min  Charges:  $Gait Training: 8-22 mins $Therapeutic Exercise: 8-22 mins                     Ellamae Sia, PT, DPT Acute Rehabilitation Services Pager [phone] Office [phone]    Willy Eddy 10/03/2018, 9:28 AM

## 2018-10-03 NOTE — Progress Notes (Signed)
  Echocardiogram 2D Echocardiogram has been performed.  Johny Chess 10/03/2018, 12:03 PM

## 2018-10-03 NOTE — Progress Notes (Signed)
EVENING ROUNDS NOTE :     [address]       [place] [address]             [phone]                 6 Days Post-Op Procedure(s) (LRB): CORONARY ARTERY BYPASS GRAFTING (CABG) x three, using left internal mammary artery and right leg greater saphenous vein harvested endoscopically (N/A) AORTIC VALVE REPLACEMENT (AVR) USING MAGNA EASE SIZE 21 MM (N/A) CLIPPING OF ATRIAL APPENDAGE USING ATRICURE FLEXV SIZE 35MM (N/A) TRANSESOPHAGEAL ECHOCARDIOGRAM (TEE) (N/A)  Total Length of Stay:  LOS: 9 days  BP (!) 136/57   Pulse (!) 105   Temp 99.1 F (37.3 C) (Oral)   Resp 19   Ht 5\' 3"  (1.6 m)   Wt 94.1 kg   SpO2 99%   BMI 36.75 kg/m   .Intake/Output      12/25 0701 - 12/26 0700 12/26 0701 - 12/27 0700   P.O. 240 880   I.V. (mL/kg) 128.4 (1.4) 215.2 (2.3)   IV Piggyback 200 94.8   Total Intake(mL/kg) 568.4 (6) 1190 (12.6)   Urine (mL/kg/hr) 1300 (0.6) 355 (0.3)   Drains  0   Stool 0    Total Output 1300 355   Net -731.6 +835        Urine Occurrence  1 x   Stool Occurrence 2 x      . sodium chloride Stopped (09/28/18 1109)  . sodium chloride    . sodium chloride 20 mL/hr at 10/03/18 1800  . lactated ringers    . lactated ringers    . lactated ringers Stopped (09/30/18 1012)  . milrinone 0.25 mcg/kg/min (10/03/18 1800)     Lab Results  Component Value Date   WBC 12.9 (H) 10/03/2018   HGB 11.1 (L) 10/03/2018   HCT 35.0 (L) 10/03/2018   PLT 219 10/03/2018   GLUCOSE 70 10/03/2018   CHOL 145 05/08/2018   TRIG 152 (H) 05/08/2018   HDL 43 05/08/2018   LDLCALC 72 05/08/2018   ALT 53 (H) 09/30/2018   AST 30 09/30/2018   NA 140 10/03/2018   K 3.3 (L) 10/03/2018   CL 104 10/03/2018   CREATININE 1.66 (H) 10/03/2018   BUN 44 (H) 10/03/2018   CO2 24 10/03/2018   TSH 3.000 05/08/2018   INR 1.47 09/27/2018   decrease long acting insulin, not on insulin preop Echo reviewed, mean gradient 11 avr, well seated   Grace Isaac MD  Beeper  [phone] Office [phone] 10/03/2018 6:49 PM

## 2018-10-03 NOTE — Progress Notes (Addendum)
Advanced Heart Failure Rounding Note  PCP-Cardiologist: Sinclair Grooms, MD   Subjective:    S/p CABG x 3 with bioprosthetic AVR and LAA clip on 12/20  Required 500 cc bolus yesterday with soft BPs. Diuretics on hold. Creatinine 1.66 today. K 3.3. Weight up 1 lb, now at pre-op weight.   On milrinone 0.125. Co-ox 50%. Back in Afib this morning 90-100s on po amiodarone. CVP 3 on RN check this am. + orthostatics this am with drop after standing, recovered after 3 minutes.   Up in chair this morning. Denies CP or SOB. Feels weak.   Objective:   Weight Range: 94.1 kg Body mass index is 36.75 kg/m.   Vital Signs:   Temp:  [97.9 F (36.6 C)-98.2 F (36.8 C)] 98 F (36.7 C) (12/26 0700) Pulse Rate:  [59-104] 102 (12/26 0900) Resp:  [14-26] 18 (12/26 0900) BP: (91-144)/(41-67) 144/61 (12/26 0833) SpO2:  [93 %-100 %] 100 % (12/26 0900) Weight:  [94.1 kg] 94.1 kg (12/26 0500) Last BM Date: 10/02/18  Weight change: Filed Weights   10/01/18 0700 10/02/18 0500 10/03/18 0500  Weight: 97 kg 93.8 kg 94.1 kg    Intake/Output:   Intake/Output Summary (Last 24 hours) at 10/03/2018 1012 Last data filed at 10/03/2018 1000 Gross per 24 hour  Intake 966.78 ml  Output 1400 ml  Net -433.22 ml      Physical Exam    General: Sitting in chair. No resp difficulty. HEENT: Normal Neck: Supple. No JVD. Carotids 2+ bilat; no bruits. No thyromegaly or nodule noted. Cor: PMI nondisplaced. IRR, No M/G/R noted. Sternal wound CDI.  Lungs: CTAB, normal effort. Abdomen: Soft, non-tender, non-distended, no HSM. No bruits or masses. +BS  Extremities: No cyanosis, clubbing, or rash. R and LLE trace edema. RUE PICC. BLE TED hose.  Neuro: Alert & orientedx3, cranial nerves grossly intact. moves all 4 extremities w/o difficulty. Affect pleasant  Telemetry   Afib 90-100s. Personally reviewed.   EKG    No new tracings.    Labs    CBC Recent Labs    10/02/18 0408 10/03/18 0456  WBC  10.3 12.9*  HGB 11.1* 11.1*  HCT 35.2* 35.0*  MCV 89.8 90.2  PLT 165 993   Basic Metabolic Panel Recent Labs    10/02/18 0408 10/03/18 0456  NA 137 140  K 3.1* 3.3*  CL 99 104  CO2 26 24  GLUCOSE 198* 70  BUN 37* 44*  CREATININE 1.67* 1.66*  CALCIUM 8.6* 8.5*   Liver Function Tests No results for input(s): AST, ALT, ALKPHOS, BILITOT, PROT, ALBUMIN in the last 72 hours. No results for input(s): LIPASE, AMYLASE in the last 72 hours. Cardiac Enzymes No results for input(s): CKTOTAL, CKMB, CKMBINDEX, TROPONINI in the last 72 hours.  BNP: BNP (last 3 results) No results for input(s): BNP in the last 8760 hours.  ProBNP (last 3 results) No results for input(s): PROBNP in the last 8760 hours.   D-Dimer No results for input(s): DDIMER in the last 72 hours. Hemoglobin A1C No results for input(s): HGBA1C in the last 72 hours. Fasting Lipid Panel No results for input(s): CHOL, HDL, LDLCALC, TRIG, CHOLHDL, LDLDIRECT in the last 72 hours. Thyroid Function Tests No results for input(s): TSH, T4TOTAL, T3FREE, THYROIDAB in the last 72 hours.  Invalid input(s): FREET3  Other results:   Imaging    No results found.   Medications:     Scheduled Medications: . amiodarone  400 mg Oral BID  .  apixaban  5 mg Oral BID  . aspirin EC  81 mg Oral Daily   Or  . aspirin  81 mg Per Tube Daily  . bisacodyl  10 mg Oral Daily   Or  . bisacodyl  10 mg Rectal Daily  . buPROPion  300 mg Oral Daily  . Chlorhexidine Gluconate Cloth  6 each Topical Daily  . insulin aspart  0-15 Units Subcutaneous TID WC  . insulin aspart  0-5 Units Subcutaneous QHS  . insulin detemir  25 Units Subcutaneous Daily  . loratadine  10 mg Oral Daily  . mouth rinse  15 mL Mouth Rinse BID  . pantoprazole  40 mg Oral Daily  . [START ON 10/04/2018] potassium chloride  20 mEq Oral Daily  . rosuvastatin  20 mg Oral Daily  . sodium chloride flush  10-40 mL Intracatheter Q12H  . sodium chloride flush  3 mL  Intravenous Q12H  . spironolactone  12.5 mg Oral Daily    Infusions: . sodium chloride Stopped (09/28/18 1109)  . sodium chloride    . sodium chloride 10 mL/hr at 09/30/18 1031  . lactated ringers    . lactated ringers    . lactated ringers Stopped (09/30/18 1012)  . milrinone 0.25 mcg/kg/min (10/03/18 1009)  . potassium chloride 10 mEq (10/03/18 1003)    PRN Medications: sodium chloride, lactated ringers, metoprolol tartrate, midazolam, morphine injection, ondansetron (ZOFRAN) IV, oxyCODONE, sodium chloride flush, sodium chloride flush, traMADol    Patient Profile   Doris Jefferson is a 77 y.o. female with Chronic diastolic CHF, HTN, CAD, PAF, h/o OSA on CPAP, DM 2, HLD, moderate AS, and obesity.   Asked to see by [facility] for optimization prior to CABG.  Assessment/Plan   1. Severe 3v CAD - Severe with 75% RCA, 100% Cx, 80% LM and LAD 50% stenosed - S/p CABG x 3 with bioprosthetic AVR and LAA clip on 12/20 - Now POD#6. Doing well. - CR on board  2. Moderate AS - s/p bioprosthetic AVR - wide pulse pressure, seems to be improving.  - Ordered echo today.    3. Acute on chronic diastolic CHF - Echo 62/3/76 LVEF 55-60%, Grade 2 DD, Mild/Mod AS, Mod MR, mild LAE, RV mild dilated, Mild TR, PA peak pressure 79 mm Hg.  - Coox 50% this am, likely in setting of recurrent Afib. Milrinone increased by Dr [name] to 0.25 mcg/kg/min.  - Volume status low. CVP 3 and +orthostatics this morning. Required 500 cc bolus yesterday. SBP 110-140s today. - Hold diuretics again today. Encourage PO intake. - Continue spiro 12.5 mg daily. Consider increasing tomorrow if euvolemic.  - Continue TED hose  4. 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 Suspect unlikely to benefit from pulmonary vasodilators.  - PA systolics were 28-31DVVO before swan pulled. Continue  milrinone 0.125 for now. Metoprolol on hold.  4. PAF - Back in Afib this morning.  - Continue po amio at 400 bid. Consider switching back to IV. - Restart Eliquis tonight. Discussed with pharmD.  6. OSA on CPAP - Continue CPAP. No change.   7. DM 2 - Per TCTS. No change.   8. AKI - Creatinine 0.9 > 1.7 > 1.9 > 1.89 > 1.67 > 1.66. Suspect ATN. Monitor closely with diuresis.   9. Hypokalemia - K 3.3. Supp ordered.  Can got to 4E from HF perspective.   Length of Stay: [place], MD  10/03/2018, 10:12 AM  Advanced Heart Failure Team Pager [phone] (M-F; [place])  Please contact [facility] for night-coverage after hours (4p -7a ) and weekends on [email or web address]  Patient seen and examined with the above-signed Advanced Practice Provider and/or Housestaff. I personally reviewed laboratory data, imaging studies and relevant notes. I independently examined the patient and formulated the important aspects of the plan. I have edited the note to reflect any of my changes or salient points. I have personally discussed the plan with the patient and/or family.  Received 500cc NS yesterday for low BP. BP stil soft. CVp 3. Mildly orthostatic. Back in AF today as well. Co-ox low at 50%. Milrinone increased by TCTS.   Suspect low co-ox mostly due to volume depletion and AF. Will continue amio. Hold diuretics. Follow co-ox and CVP. Hopefully can start milrinone wean tomorrow.   Will start Eliquis tonight after pacing wires pulled. D/w TCTS and PharmD personally.   Supp K.  Glori Bickers, MD  10:14 AM

## 2018-10-03 NOTE — Progress Notes (Signed)
Hypoglycemic Event  CBG: 57  Treatment: 4 oz juice/soda  Symptoms: None  Follow-up CBG: Time:1701 CBG Result:78  Possible Reasons for Event: Medication regimen: Long acting insulin rec'd this morning with poor/fair meal intake.  Comments/MD notified:Patient ate 50% of her supper after drinking 4oz of soda.    Aaron Edelman, Keiyana Stehr Bertha

## 2018-10-04 ENCOUNTER — Inpatient Hospital Stay (HOSPITAL_COMMUNITY): Payer: Medicare Other

## 2018-10-04 LAB — CBC
HCT: 31.3 % — ABNORMAL LOW (ref 36.0–46.0)
Hemoglobin: 10.1 g/dL — ABNORMAL LOW (ref 12.0–15.0)
MCH: 29.5 pg (ref 26.0–34.0)
MCHC: 32.3 g/dL (ref 30.0–36.0)
MCV: 91.5 fL (ref 80.0–100.0)
Platelets: 213 10*3/uL (ref 150–400)
RBC: 3.42 MIL/uL — ABNORMAL LOW (ref 3.87–5.11)
RDW: 14.8 % (ref 11.5–15.5)
WBC: 11.8 10*3/uL — ABNORMAL HIGH (ref 4.0–10.5)
nRBC: 0 % (ref 0.0–0.2)

## 2018-10-04 LAB — COOXEMETRY PANEL
Carboxyhemoglobin: 1.4 % (ref 0.5–1.5)
Methemoglobin: 1.7 % — ABNORMAL HIGH (ref 0.0–1.5)
O2 Saturation: 60.9 %
TOTAL HEMOGLOBIN: 10.3 g/dL — AB (ref 12.0–16.0)

## 2018-10-04 LAB — GLUCOSE, CAPILLARY
GLUCOSE-CAPILLARY: 156 mg/dL — AB (ref 70–99)
GLUCOSE-CAPILLARY: 175 mg/dL — AB (ref 70–99)
Glucose-Capillary: 131 mg/dL — ABNORMAL HIGH (ref 70–99)
Glucose-Capillary: 270 mg/dL — ABNORMAL HIGH (ref 70–99)

## 2018-10-04 LAB — BASIC METABOLIC PANEL
Anion gap: 11 (ref 5–15)
BUN: 39 mg/dL — ABNORMAL HIGH (ref 8–23)
CO2: 22 mmol/L (ref 22–32)
CREATININE: 1.31 mg/dL — AB (ref 0.44–1.00)
Calcium: 8 mg/dL — ABNORMAL LOW (ref 8.9–10.3)
Chloride: 104 mmol/L (ref 98–111)
GFR calc Af Amer: 45 mL/min — ABNORMAL LOW (ref >60)
GFR, EST NON AFRICAN AMERICAN: 39 mL/min — AB (ref >60)
Glucose, Bld: 133 mg/dL — ABNORMAL HIGH (ref 70–99)
Potassium: 3.5 mmol/L (ref 3.5–5.1)
Sodium: 137 mmol/L (ref 135–145)

## 2018-10-04 LAB — HEMOGLOBIN A1C
HEMOGLOBIN A1C: 5.8 % — AB (ref 4.8–5.6)
Mean Plasma Glucose: 119.76 mg/dL

## 2018-10-04 MED ORDER — INSULIN DETEMIR 100 UNIT/ML ~~LOC~~ SOLN
12.0000 [IU] | Freq: Every day | SUBCUTANEOUS | Status: DC
  Administered 2018-10-04 – 2018-10-05 (×2): 12 [IU] via SUBCUTANEOUS
  Filled 2018-10-04 (×2): qty 0.12

## 2018-10-04 MED ORDER — POTASSIUM CHLORIDE CRYS ER 20 MEQ PO TBCR
30.0000 meq | EXTENDED_RELEASE_TABLET | Freq: Once | ORAL | Status: AC
  Administered 2018-10-04: 30 meq via ORAL

## 2018-10-04 MED ORDER — AMIODARONE HCL IN DEXTROSE 360-4.14 MG/200ML-% IV SOLN
30.0000 mg/h | INTRAVENOUS | Status: DC
  Administered 2018-10-04 – 2018-10-06 (×5): 30 mg/h via INTRAVENOUS
  Filled 2018-10-04 (×4): qty 200

## 2018-10-04 MED ORDER — AMIODARONE HCL IN DEXTROSE 360-4.14 MG/200ML-% IV SOLN
60.0000 mg/h | INTRAVENOUS | Status: AC
  Administered 2018-10-04: 60 mg/h via INTRAVENOUS
  Filled 2018-10-04 (×2): qty 200

## 2018-10-04 MED ORDER — AMIODARONE LOAD VIA INFUSION
150.0000 mg | Freq: Once | INTRAVENOUS | Status: AC
  Administered 2018-10-04: 150 mg via INTRAVENOUS
  Filled 2018-10-04: qty 83.34

## 2018-10-04 NOTE — Progress Notes (Addendum)
TCTS DAILY ICU PROGRESS NOTE                   [address]            [place] [address]          [phone]   7 Days Post-Op Procedure(s) (LRB): CORONARY ARTERY BYPASS GRAFTING (CABG) x three, using left internal mammary artery and right leg greater saphenous vein harvested endoscopically (N/A) AORTIC VALVE REPLACEMENT (AVR) USING MAGNA EASE SIZE 21 MM (N/A) CLIPPING OF ATRIAL APPENDAGE USING ATRICURE FLEXV SIZE 35MM (N/A) TRANSESOPHAGEAL ECHOCARDIOGRAM (TEE) (N/A)  Total Length of Stay:  LOS: 10 days   Subjective: Patient states had loose stools yesterday. She does not like the food, but has no other complaints this am.  Objective: Vital signs in last 24 hours: Temp:  [98 F (36.7 C)-99.7 F (37.6 C)] 98 F (36.7 C) (12/27 0700) Pulse Rate:  [89-126] 117 (12/27 0700) Cardiac Rhythm: Atrial fibrillation (12/27 0400) Resp:  [15-26] 26 (12/27 0700) BP: (99-144)/(36-101) 131/64 (12/27 0700) SpO2:  [91 %-100 %] 98 % (12/27 0700) Weight:  [94.9 kg] 94.9 kg (12/27 0500)  Filed Weights   10/02/18 0500 10/03/18 0500 10/04/18 0500  Weight: 93.8 kg 94.1 kg 94.9 kg    Weight change: 0.8 kg   Hemodynamic parameters for last 24 hours: CVP:  [3 mmHg-5 mmHg] 5 mmHg  Intake/Output from previous day: 12/26 0701 - 12/27 0700 In: 1434.4 [P.O.:880; I.V.:459.6; IV Piggyback:94.8] Out: 805 [Urine:805]  Intake/Output this shift: No intake/output data recorded.  Current Meds: Scheduled Meds: . amiodarone  150 mg Intravenous Once  . apixaban  5 mg Oral BID  . aspirin EC  81 mg Oral Daily   Or  . aspirin  81 mg Per Tube Daily  . bisacodyl  10 mg Oral Daily   Or  . bisacodyl  10 mg Rectal Daily  . buPROPion  300 mg Oral Daily  . Chlorhexidine Gluconate Cloth  6 each Topical Daily  . insulin aspart  0-15 Units Subcutaneous TID WC  . insulin aspart  0-5 Units Subcutaneous QHS  . insulin detemir  15 Units Subcutaneous Daily  . loratadine  10 mg Oral Daily  .  mouth rinse  15 mL Mouth Rinse BID  . pantoprazole  40 mg Oral Daily  . potassium chloride  20 mEq Oral Daily  . rosuvastatin  20 mg Oral Daily  . sodium chloride flush  10-40 mL Intracatheter Q12H  . sodium chloride flush  3 mL Intravenous Q12H  . spironolactone  12.5 mg Oral Daily   Continuous Infusions: . sodium chloride Stopped (09/28/18 1109)  . sodium chloride    . sodium chloride 10 mL/hr at 10/04/18 0700  . amiodarone     Followed by  . amiodarone    . lactated ringers    . lactated ringers    . lactated ringers Stopped (09/30/18 1012)  . milrinone 0.25 mcg/kg/min (10/04/18 0700)   PRN Meds:.sodium chloride, lactated ringers, metoprolol tartrate, midazolam, morphine injection, ondansetron (ZOFRAN) IV, oxyCODONE, sodium chloride flush, sodium chloride flush, traMADol  General appearance: alert, cooperative and no distress Neurologic: intact Heart: IRRR IRRR Lungs: Slightly diminished at bases L>R Abdomen: Soft, obese, non tender bowel sounds present Extremities: Knee high ted hose Wound: Preveena wound vac on sternum. RLE wound clean and dry  Lab Results: CBC: Recent Labs    10/03/18 0456 10/04/18 0531  WBC 12.9* 11.8*  HGB 11.1* 10.1*  HCT 35.0*  31.3*  PLT 219 213   BMET:  Recent Labs    10/03/18 0456 10/04/18 0531  NA 140 137  K 3.3* 3.5  CL 104 104  CO2 24 22  GLUCOSE 70 133*  BUN 44* 39*  CREATININE 1.66* 1.31*  CALCIUM 8.5* 8.0*    CMET: Lab Results  Component Value Date   WBC 11.8 (H) 10/04/2018   HGB 10.1 (L) 10/04/2018   HCT 31.3 (L) 10/04/2018   PLT 213 10/04/2018   GLUCOSE 133 (H) 10/04/2018   CHOL 145 05/08/2018   TRIG 152 (H) 05/08/2018   HDL 43 05/08/2018   LDLCALC 72 05/08/2018   ALT 53 (H) 09/30/2018   AST 30 09/30/2018   NA 137 10/04/2018   K 3.5 10/04/2018   CL 104 10/04/2018   CREATININE 1.31 (H) 10/04/2018   BUN 39 (H) 10/04/2018   CO2 22 10/04/2018   TSH 3.000 05/08/2018   INR 1.47 09/27/2018    PT/INR: No  results for input(s): LABPROT, INR in the last 72 hours. Radiology: Dg Chest Port 1 View  Result Date: 10/04/2018 CLINICAL DATA:  Status post CABG surgery formed on 09/27/2018. Follow-up exam. EXAM: PORTABLE CHEST 1 VIEW COMPARISON:  10/02/2018 and older exams. FINDINGS: Focal opacity lies in the left upper lobe lateral to the left hilum consistent with atelectasis similar to the studies from 12/25 and 10/01/2018. There is also hazy opacity at the left lung base consistent with combination of a small pleural effusion and atelectasis, also stable. Remainder of the lungs is clear. No pneumothorax. No mediastinal widening. Right PICC is stable. IMPRESSION: 1. No acute findings or evidence of an operative complication. 2. Persistent small left pleural effusion with associated lung base atelectasis. Focal atelectasis also noted in the left upper lobe lateral to the left hilum. Remainder of the lungs is clear. Electronically Signed   By: Lajean Manes M.D.   On: 10/04/2018 06:43     Assessment/Plan: S/P Procedure(s) (LRB): CORONARY ARTERY BYPASS GRAFTING (CABG) x three, using left internal mammary artery and right leg greater saphenous vein harvested endoscopically (N/A) AORTIC VALVE REPLACEMENT (AVR) USING MAGNA EASE SIZE 21 MM (N/A) CLIPPING OF ATRIAL APPENDAGE USING ATRICURE FLEXV SIZE 35MM (N/A) TRANSESOPHAGEAL ECHOCARDIOGRAM (TEE) (N/A)  1. CV-A fib with HR into the 120's. On Amiodarone 400 mg bid, Spironolactone 12.5 mg daily, Apixaban 5 mg bid, and Milrinone drip (0.25) . Co ox this am increased to 60.9. Will defer to heart failure when ok to start Lopressor. This would help with better HR control. Per heart failure, echo to be done prior to discharge. 2. Pulmonary-She has moderate pulmonary hypertension-Lopressor stopped yesterday. On 2 liters of oxygen via [place]. Will wean as able over next few days. CXR this am shows left base atelectasis and small pleural effusion. Encourage incentive  spirometer 3. Volume overload-previously on Lasix drip. May have been over diuresed.  4. ABL anemia- H and H stable at 10.1 and 31.3this am 5. DM-CBGs 78/121/131. On Insulin but will decrease to avoid hypoglycemia. Await HGA1C  6. AKI-Likely etiology is ATN.Creatinine this am decreased to 1.31 7. Supplement potassium 8. Deconditioning-continue PT 9. May be able to remove Praveena wound vac today  Donielle Liston Alba PA-C 10/04/2018 7:49 AM   I have seen and examined Doris Jefferson and agree with the above assessment  and plan.  Doris Isaac MD Beeper [phone] Office [phone] 10/04/2018 9:37 AM

## 2018-10-04 NOTE — Progress Notes (Addendum)
Advanced Heart Failure Rounding Note  PCP-Cardiologist: Sinclair Grooms, MD   Subjective:    S/p CABG x 3 with bioprosthetic AVR and LAA clip on 12/20  Diuretics on hold. CVP 8-9. Creatinine 1.66 > 1.31. Weight up 2 lbs.   On milrinone 0.25. Co-ox 61%. Remains in afib 120s on PO amiodarone.  Echo 12/26: 50-55%, trivial AI with mean gradient of 11 mmHg, trivial MR, RV mod reduced, mild to mod TR  Denies CP or SOB. Up in chair this morning. Okay with short term SNF when ready for DC.   Objective:   Weight Range: 94.9 kg Body mass index is 37.06 kg/m.   Vital Signs:   Temp:  [98 F (36.7 C)-99.7 F (37.6 C)] 98 F (36.7 C) (12/27 0700) Pulse Rate:  [89-126] 117 (12/27 0700) Resp:  [15-26] 26 (12/27 0700) BP: (99-144)/(36-101) 131/64 (12/27 0700) SpO2:  [91 %-100 %] 98 % (12/27 0700) Weight:  [94.9 kg] 94.9 kg (12/27 0500) Last BM Date: 10/02/18  Weight change: Filed Weights   10/02/18 0500 10/03/18 0500 10/04/18 0500  Weight: 93.8 kg 94.1 kg 94.9 kg    Intake/Output:   Intake/Output Summary (Last 24 hours) at 10/04/2018 0752 Last data filed at 10/04/2018 0700 Gross per 24 hour  Intake 1434.42 ml  Output 805 ml  Net 629.42 ml      Physical Exam    General: Elderly. Sitting in chair. No resp difficulty. HEENT: Normal Neck: Supple. JVP 8-9. Carotids 2+ bilat; no bruits. No thyromegaly or nodule noted. Cor: PMI nondisplaced. Tachy, irregular, No M/G/R noted. Sternal wound CDI.  Lungs: CTAB, normal effort. Abdomen: Soft, non-tender, non-distended, no HSM. No bruits or masses. +BS  Extremities: No cyanosis, clubbing, or rash. R and LLE no edema. RUE PICC. BLE TED hose. Neuro: Alert & orientedx3, cranial nerves grossly intact. moves all 4 extremities w/o difficulty. Affect pleasant  Telemetry   Afib 100-120s. Personally reviewed.   EKG    No new tracings.    Labs    CBC Recent Labs    10/03/18 0456 10/04/18 0531  WBC 12.9* 11.8*  HGB 11.1*  10.1*  HCT 35.0* 31.3*  MCV 90.2 91.5  PLT 219 517   Basic Metabolic Panel Recent Labs    10/03/18 0456 10/04/18 0531  NA 140 137  K 3.3* 3.5  CL 104 104  CO2 24 22  GLUCOSE 70 133*  BUN 44* 39*  CREATININE 1.66* 1.31*  CALCIUM 8.5* 8.0*   Liver Function Tests No results for input(s): AST, ALT, ALKPHOS, BILITOT, PROT, ALBUMIN in the last 72 hours. No results for input(s): LIPASE, AMYLASE in the last 72 hours. Cardiac Enzymes No results for input(s): CKTOTAL, CKMB, CKMBINDEX, TROPONINI in the last 72 hours.  BNP: BNP (last 3 results) No results for input(s): BNP in the last 8760 hours.  ProBNP (last 3 results) No results for input(s): PROBNP in the last 8760 hours.   D-Dimer No results for input(s): DDIMER in the last 72 hours. Hemoglobin A1C Recent Labs    10/04/18 0531  HGBA1C 5.8*   Fasting Lipid Panel No results for input(s): CHOL, HDL, LDLCALC, TRIG, CHOLHDL, LDLDIRECT in the last 72 hours. Thyroid Function Tests No results for input(s): TSH, T4TOTAL, T3FREE, THYROIDAB in the last 72 hours.  Invalid input(s): FREET3  Other results:   Imaging    Dg Chest Port 1 View  Result Date: 10/04/2018 CLINICAL DATA:  Status post CABG surgery formed on 09/27/2018. Follow-up exam. EXAM:  PORTABLE CHEST 1 VIEW COMPARISON:  10/02/2018 and older exams. FINDINGS: Focal opacity lies in the left upper lobe lateral to the left hilum consistent with atelectasis similar to the studies from 12/25 and 10/01/2018. There is also hazy opacity at the left lung base consistent with combination of a small pleural effusion and atelectasis, also stable. Remainder of the lungs is clear. No pneumothorax. No mediastinal widening. Right PICC is stable. IMPRESSION: 1. No acute findings or evidence of an operative complication. 2. Persistent small left pleural effusion with associated lung base atelectasis. Focal atelectasis also noted in the left upper lobe lateral to the left hilum. Remainder  of the lungs is clear. Electronically Signed   By: Lajean Manes M.D.   On: 10/04/2018 06:43     Medications:     Scheduled Medications: . amiodarone  150 mg Intravenous Once  . apixaban  5 mg Oral BID  . aspirin EC  81 mg Oral Daily   Or  . aspirin  81 mg Per Tube Daily  . bisacodyl  10 mg Oral Daily   Or  . bisacodyl  10 mg Rectal Daily  . buPROPion  300 mg Oral Daily  . Chlorhexidine Gluconate Cloth  6 each Topical Daily  . insulin aspart  0-15 Units Subcutaneous TID WC  . insulin aspart  0-5 Units Subcutaneous QHS  . insulin detemir  15 Units Subcutaneous Daily  . loratadine  10 mg Oral Daily  . mouth rinse  15 mL Mouth Rinse BID  . pantoprazole  40 mg Oral Daily  . potassium chloride  20 mEq Oral Daily  . rosuvastatin  20 mg Oral Daily  . sodium chloride flush  10-40 mL Intracatheter Q12H  . sodium chloride flush  3 mL Intravenous Q12H  . spironolactone  12.5 mg Oral Daily    Infusions: . sodium chloride Stopped (09/28/18 1109)  . sodium chloride    . sodium chloride 10 mL/hr at 10/04/18 0700  . amiodarone     Followed by  . amiodarone    . lactated ringers    . lactated ringers    . lactated ringers Stopped (09/30/18 1012)  . milrinone 0.25 mcg/kg/min (10/04/18 0700)    PRN Medications: sodium chloride, lactated ringers, metoprolol tartrate, midazolam, morphine injection, ondansetron (ZOFRAN) IV, oxyCODONE, sodium chloride flush, sodium chloride flush, traMADol    Patient Profile   Doris Jefferson is a 77 y.o. female with Chronic diastolic CHF, HTN, CAD, PAF, h/o OSA on CPAP, DM 2, HLD, moderate AS, and obesity.   Asked to see by [facility] for optimization prior to CABG.  Assessment/Plan   1. Severe 3v CAD - Severe with 75% RCA, 100% Cx, 80% LM and LAD 50% stenosed - S/p CABG x 3 with bioprosthetic AVR and LAA clip on 12/20 - Now POD#7. Doing well. - CR on board  2. Moderate AS - s/p bioprosthetic AVR - wide pulse pressure, seems to be improving.  -  Echo 12/26: 50-55%, trivial AI with mean gradient of 11 mmHg, trivial MR, RV mod reduced, mild to mod TR   3. Acute on chronic diastolic CHF - Echo 42/7/06 LVEF 55-60%, Grade 2 DD, Mild/Mod AS, Mod MR, mild LAE, RV mild dilated, Mild TR, PA peak pressure 79 mm Hg.  - Coox 60.9% on milrinone 0.25. Decrease to 0.125.  - Volume status okay on exam. CVP 8-9. Hold diuretics again today. Plan to start PO tomorrow.   - Continue spiro 12.5 mg daily. SBP generally  100-110s - Continue TED hose  4. 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 Suspect unlikely to benefit from pulmonary vasodilators.  - PA systolics were [id number] before swan pulled. Continue milrinone 0.125 for now.  4. PAF - Remains in Afib 120s.  - Restart IV amio for rate control. - Continue Eliquis 5 mg BID (restarted 12/26 pm) - Will need DCCV Monday if she does not convert with IV amio.   6. OSA on CPAP - Continue CPAP. No change.    7. DM 2 - Per TCTS. No change.   8. AKI - Creatinine 0.9 > 1.7 > 1.9 > 1.89 > 1.67 > 1.66 > 1.3. Suspect ATN. Improved with holding diuretics.   9. Hypokalemia - K 3.5. Supp ordered.  10. Disposition - Will need SNF at DC. She is agreeable.   Can transfer to 4E.  Length of Stay: [place], NP  10/04/2018, 7:52 AM  Advanced Heart Failure Team Pager [phone] (M-F; 7a - 4p)  Please contact [facility] for night-coverage after hours (4p -7a ) and weekends on [email or web address]    Patient seen and examined with the above-signed Advanced Practice Provider and/or Housestaff. I personally reviewed laboratory data, imaging studies and relevant notes. I independently examined the patient and formulated the important aspects of the plan. I have edited the note to reflect any of my changes or salient points. I have personally discussed the plan with the  patient and/or family.  Overall improved today on milrinone 0.25. However remains in AF with RVR. Co-ox 61%. Volume status ok. Will wean milrinone to 0.125. Start IV amiodarone. Continue Eliquis. Can move to 4E. Will continue amio over the weekend. If still in AF will plan DC-CV on Monday am. No TEE needed as LAA has been clipped. Will need SNF at d/c, Restart low-dose diuretics tomorrow.   Glori Bickers, MD  8:31 AM

## 2018-10-04 NOTE — Progress Notes (Signed)
Rehab Admissions Coordinator Note:  Per Dr. [name] request, this patient was screened by Jhonnie Garner for appropriateness for an Inpatient Acute Rehab Consult.  At this time, we are recommending an Inpatient Rehab consult. AC will contact MD to request an IP Rehab Consult Order.   Jhonnie Garner 10/04/2018, 5:55 PM  I can be reached at [phone].

## 2018-10-04 NOTE — NC FL2 (Signed)
[place] MEDICAID FL2 LEVEL OF CARE SCREENING TOOL     IDENTIFICATION  Patient Name: Doris Jefferson Birthdate: 02/11/1941 Sex: female Admission Date (Current Location): 09/24/2018  [facility] and [place] Number:  Herbalist and Address:  [facility], [place] [address], [place], [place] [address]      Provider Number: [id number]  Attending Physician Name and Address:  Jettie Booze, MD  Relative Name and Phone Number:  Rionna, [place], [phone]    Current Level of Care: Hospital Recommended Level of Care: [place] Prior Approval Number:    Date Approved/Denied: 10/04/18 PASRR Number: [id number]  Discharge Plan: SNF    Current Diagnoses: Patient Active Problem List   Diagnosis Date Noted  . 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 S/P AVR (aortic valve replacement) 09/27/2018  . CAD (coronary artery disease) 09/24/2018  . Abnormal echocardiogram   . DOE (dyspnea on exertion)   . Coronary artery disease   . Vertigo 02/08/2018  . Iron deficiency anemia, unspecified 11/13/2017  . HTN (hypertension) 09/25/2016  . Esophageal reflux 02/23/2016  . Pure hypercholesterolemia 02/23/2016  . Unspecified cataract 02/23/2016  . Paroxysmal atrial fibrillation ([place]) 02/23/2016  . Visit for monitoring Tikosyn therapy 09/06/2015  . Persistent atrial fibrillation 06/23/2014  . Obesity (BMI 30-39.9) 03/04/2014  . OSA (obstructive sleep apnea) 12/31/2013  . Anticoagulation goal of INR 2 to 3 11/17/2013  . Moderate aortic stenosis 11/10/2013  . Pulmonary HTN ([place]) 11/10/2013  . Diabetes mellitus ([place]) 11/10/2013    Orientation RESPIRATION BLADDER Height & Weight     Self, Time, Situation, Place  Normal Continent Weight: 209 lb 3.5 oz (94.9 kg) Height:  5\' 3"  (160 cm)  BEHAVIORAL SYMPTOMS/MOOD NEUROLOGICAL BOWEL NUTRITION STATUS      Continent Diet(Carb Modified )  AMBULATORY STATUS COMMUNICATION OF NEEDS Skin   Independent  Verbally Surgical wounds(Surgicial incision on chest and right leg. )                       Personal Care Assistance Level of Assistance  Bathing, Feeding, Dressing, Total care Bathing Assistance: Limited assistance Feeding assistance: Independent Dressing Assistance: Limited assistance Total Care Assistance: Limited assistance   Functional Limitations Info  Sight, Hearing, Speech Sight Info: Adequate Hearing Info: Adequate Speech Info: Adequate    SPECIAL CARE FACTORS FREQUENCY  PT (By licensed PT), OT (By licensed OT)     PT Frequency: 5x/wk OT Frequency: 5x/wk            Contractures Contractures Info: Not present    Additional Factors Info  Code Status, Allergies Code Status Info: Full Code Allergies Info: Minocin Minocycline Hcl, Benazepril, Hydrochlorothiazide, Tape           Current Medications (10/04/2018):  This is the current hospital active medication list Current Facility-Administered Medications  Medication Dose Route Frequency Provider Last Rate Last Dose  . 0.45 % sodium chloride infusion   Intravenous Continuous PRN Barrett, Erin R, PA-C   Stopped at 09/28/18 1109  . 0.9 %  sodium chloride infusion  250 mL Intravenous Continuous Barrett, Erin R, PA-C      . 0.9 %  sodium chloride infusion   Intravenous Continuous Barrett, Erin R, PA-C 10 mL/hr at 10/04/18 0700    . amiodarone (NEXTERONE PREMIX) 360-4.14 MG/200ML-% (1.8 mg/mL) IV infusion  60 mg/hr Intravenous Continuous Bensimhon, Shaune Pascal, MD 33.3 mL/hr at 10/04/18 0824 60 mg/hr at 10/04/18 0824   Followed  by  . amiodarone (NEXTERONE PREMIX) 360-4.14 MG/200ML-% (1.8 mg/mL) IV infusion  30 mg/hr Intravenous Continuous Bensimhon, Shaune Pascal, MD      . apixaban (ELIQUIS) tablet 5 mg  5 mg Oral BID Grace Isaac, MD   5 mg at 10/04/18 0809  . aspirin EC tablet 81 mg  81 mg Oral Daily Grace Isaac, MD   81 mg at 10/04/18 3716   Or  . aspirin chewable tablet 81 mg  81 mg Per Tube Daily  Grace Isaac, MD      . bisacodyl (DULCOLAX) EC tablet 10 mg  10 mg Oral Daily Barrett, Erin R, PA-C   10 mg at 10/02/18 9678   Or  . bisacodyl (DULCOLAX) suppository 10 mg  10 mg Rectal Daily Barrett, Erin R, PA-C      . buPROPion (WELLBUTRIN XL) 24 hr tablet 300 mg  300 mg Oral Daily Shirley Friar, PA-C   300 mg at 10/04/18 0809  . Chlorhexidine Gluconate Cloth 2 % PADS 6 each  6 each Topical Daily Grace Isaac, MD   6 each at 10/03/18 1443  . insulin aspart (novoLOG) injection 0-15 Units  0-15 Units Subcutaneous TID WC Melrose Nakayama, MD   2 Units at 10/04/18 0800  . insulin aspart (novoLOG) injection 0-5 Units  0-5 Units Subcutaneous QHS [place] Nakayama, MD      . insulin detemir (LEVEMIR) injection 12 Units  12 Units Subcutaneous Daily Nani Skillern, [place]   12 Units at 10/04/18 0815  . lactated ringers infusion 500 mL  500 mL Intravenous Once PRN Barrett, Erin R, PA-C      . lactated ringers infusion   Intravenous Continuous Barrett, Erin R, PA-C      . lactated ringers infusion   Intravenous Continuous Barrett, Erin R, PA-C   Stopped at 09/30/18 1012  . loratadine (CLARITIN) tablet 10 mg  10 mg Oral Daily Shirley Friar, PA-C   10 mg at 10/04/18 0810  . MEDLINE mouth rinse  15 mL Mouth Rinse BID Grace Isaac, MD   15 mL at 10/04/18 9381  . metoprolol tartrate (LOPRESSOR) injection 2.5-5 mg  2.5-5 mg Intravenous Q2H PRN Barrett, Erin R, PA-C      . midazolam (VERSED) injection 2 mg  2 mg Intravenous Q1H PRN Barrett, Erin R, PA-C      . milrinone (PRIMACOR) 20 MG/100 ML (0.2 mg/mL) infusion  0.125 mcg/kg/min Intravenous Continuous Bensimhon, Shaune Pascal, MD 3.53 mL/hr at 10/04/18 0816 0.125 mcg/kg/min at 10/04/18 0816  . morphine 2 MG/ML injection 1-4 mg  1-4 mg Intravenous Q1H PRN Barrett, Erin R, PA-C   2 mg at 09/29/18 0522  . ondansetron (ZOFRAN) injection 4 mg  4 mg Intravenous Q6H PRN Barrett, Erin R, PA-C      . oxyCODONE (Oxy  IR/ROXICODONE) immediate release tablet 5-10 mg  5-10 mg Oral Q3H PRN Barrett, Erin R, PA-C   5 mg at 10/01/18 1232  . pantoprazole (PROTONIX) EC tablet 40 mg  40 mg Oral Daily Barrett, Erin R, PA-C   40 mg at 10/04/18 0810  . potassium chloride SA (K-DUR,KLOR-CON) CR tablet 20 mEq  20 mEq Oral Daily Lars Pinks M, PA-C      . rosuvastatin (CRESTOR) tablet 20 mg  20 mg Oral Daily Shirley Friar, PA-C   20 mg at 10/04/18 0810  . sodium chloride flush (NS) 0.9 % injection 10-40 mL  10-40 mL Intracatheter Q12H Gerhardt,  Lilia Argue, MD   10 mL at 10/04/18 4403  . sodium chloride flush (NS) 0.9 % injection 10-40 mL  10-40 mL Intracatheter PRN Grace Isaac, MD      . sodium chloride flush (NS) 0.9 % injection 3 mL  3 mL Intravenous Q12H Barrett, Erin R, PA-C   3 mL at 10/03/18 2028  . sodium chloride flush (NS) 0.9 % injection 3 mL  3 mL Intravenous PRN Barrett, Erin R, PA-C      . spironolactone (ALDACTONE) tablet 12.5 mg  12.5 mg Oral Daily Bensimhon, Shaune Pascal, MD   12.5 mg at 10/04/18 0809  . traMADol (ULTRAM) tablet 50-100 mg  50-100 mg Oral Q4H PRN Barrett, Erin R, PA-C   100 mg at 10/02/18 1221     Discharge Medications: Please see discharge summary for a list of discharge medications.  Relevant Imaging Results:  Relevant Lab Results:   Additional Information SSN: [id number]  Philippa Chester Lashanta Elbe, LCSWA

## 2018-10-04 NOTE — Progress Notes (Signed)
Patient ID: Doris Jefferson, female   DOB: 09-16-1941, 77 y.o.   MRN: [id number] TCTS Evening Rounds:  Hemodynamically stable on milrinone 0.125  Atrial fib on IV amio.  Urine output ok  Ambulated well.

## 2018-10-05 LAB — GLUCOSE, CAPILLARY
GLUCOSE-CAPILLARY: 248 mg/dL — AB (ref 70–99)
Glucose-Capillary: 184 mg/dL — ABNORMAL HIGH (ref 70–99)
Glucose-Capillary: 184 mg/dL — ABNORMAL HIGH (ref 70–99)
Glucose-Capillary: 178 mg/dL — ABNORMAL HIGH (ref 70–99)

## 2018-10-05 LAB — BASIC METABOLIC PANEL
Anion gap: 13 (ref 5–15)
BUN: 31 mg/dL — ABNORMAL HIGH (ref 8–23)
CHLORIDE: 104 mmol/L (ref 98–111)
CO2: 21 mmol/L — AB (ref 22–32)
Calcium: 7.7 mg/dL — ABNORMAL LOW (ref 8.9–10.3)
Creatinine, Ser: 1.25 mg/dL — ABNORMAL HIGH (ref 0.44–1.00)
GFR calc Af Amer: 48 mL/min — ABNORMAL LOW (ref >60)
GFR calc non Af Amer: 41 mL/min — ABNORMAL LOW (ref >60)
Glucose, Bld: 249 mg/dL — ABNORMAL HIGH (ref 70–99)
Potassium: 3.7 mmol/L (ref 3.5–5.1)
Sodium: 138 mmol/L (ref 135–145)

## 2018-10-05 LAB — COOXEMETRY PANEL
Carboxyhemoglobin: 1.2 % (ref 0.5–1.5)
Methemoglobin: 1.8 % — ABNORMAL HIGH (ref 0.0–1.5)
O2 Saturation: 63.3 %
Total hemoglobin: 10.2 g/dL — ABNORMAL LOW (ref 12.0–16.0)

## 2018-10-05 LAB — CBC
HEMATOCRIT: 31.7 % — AB (ref 36.0–46.0)
Hemoglobin: 9.7 g/dL — ABNORMAL LOW (ref 12.0–15.0)
MCH: 28.1 pg (ref 26.0–34.0)
MCHC: 30.6 g/dL (ref 30.0–36.0)
MCV: 91.9 fL (ref 80.0–100.0)
Platelets: 224 10*3/uL (ref 150–400)
RBC: 3.45 MIL/uL — ABNORMAL LOW (ref 3.87–5.11)
RDW: 15 % (ref 11.5–15.5)
WBC: 10.7 10*3/uL — ABNORMAL HIGH (ref 4.0–10.5)
nRBC: 0 % (ref 0.0–0.2)

## 2018-10-05 MED ORDER — FUROSEMIDE 40 MG PO TABS
40.0000 mg | ORAL_TABLET | Freq: Every day | ORAL | Status: DC
  Administered 2018-10-05 – 2018-10-07 (×3): 40 mg via ORAL
  Filled 2018-10-05 (×3): qty 1

## 2018-10-05 MED ORDER — SILDENAFIL CITRATE 20 MG PO TABS
20.0000 mg | ORAL_TABLET | Freq: Three times a day (TID) | ORAL | Status: DC
  Administered 2018-10-05 – 2018-10-09 (×12): 20 mg via ORAL
  Filled 2018-10-05 (×14): qty 1

## 2018-10-05 MED ORDER — POTASSIUM CHLORIDE CRYS ER 20 MEQ PO TBCR: 20.0000 meq | EXTENDED_RELEASE_TABLET | ORAL | Status: DC | PRN

## 2018-10-05 NOTE — Progress Notes (Signed)
Advanced Heart Failure Rounding Note  PCP-Cardiologist: Sinclair Grooms, MD   Subjective:    S/p CABG x 3 with bioprosthetic AVR and LAA clip on 12/20  Milrinone weaned to 0.125 yesterday. IV amio restarted due to AF with RVR.   Co-ox 63% this am. Feels good. No SOB, orthopnea or PND. Remains in AF but rate better. Walked the unit this am.   Diuretics on hold. Weight up 3 pounds (about 5 pounds above pre-op weight) Creatinine improving. CVP 6-7  Echo 12/26: 50-55%, trivial AI with mean gradient of 11 mmHg, trivial MR, RV mod reduced, mild to mod TR  Objective:   Weight Range: 96.4 kg Body mass index is 37.64 kg/m.   Vital Signs:   Temp:  [98.3 F (36.8 C)-98.9 F (37.2 C)] 98.3 F (36.8 C) (12/28 0400) Pulse Rate:  [92-109] 101 (12/28 0800) Resp:  [15-35] 21 (12/28 0800) BP: (108-156)/(52-130) 132/57 (12/28 0800) SpO2:  [96 %-100 %] 100 % (12/28 0800) Weight:  [96.4 kg] 96.4 kg (12/28 0500) Last BM Date: 10/03/18  Weight change: Filed Weights   10/03/18 0500 10/04/18 0500 10/05/18 0500  Weight: 94.1 kg 94.9 kg 96.4 kg    Intake/Output:   Intake/Output Summary (Last 24 hours) at 10/05/2018 0912 Last data filed at 10/05/2018 0800 Gross per 24 hour  Intake 1540.52 ml  Output 950 ml  Net 590.52 ml      Physical Exam    General:  Sitting in chair . No resp difficulty HEENT: normal Neck: supple. JVP 6-7 Carotids 2+ bilat; no bruits. No lymphadenopathy or thryomegaly appreciated. Cor: PMI nondisplaced. Irregular rate & rhythm. No rubs, gallops or murmurs. Lungs: clear Abdomen: obese soft, nontender, nondistended. No hepatosplenomegaly. No bruits or masses. Good bowel sounds. Extremities: no cyanosis, clubbing, rash, trace edema  RUE PICC Neuro: alert & orientedx3, cranial nerves grossly intact. moves all 4 extremities w/o difficulty. Affect pleasant   Telemetry   Afib 100-120s. Personally reviewed.   EKG    No new tracings.    Labs     CBC Recent Labs    10/04/18 0531 10/05/18 0456  WBC 11.8* 10.7*  HGB 10.1* 9.7*  HCT 31.3* 31.7*  MCV 91.5 91.9  PLT 213 638   Basic Metabolic Panel Recent Labs    10/04/18 0531 10/05/18 0456  NA 137 138  K 3.5 3.7  CL 104 104  CO2 22 21*  GLUCOSE 133* 249*  BUN 39* 31*  CREATININE 1.31* 1.25*  CALCIUM 8.0* 7.7*   Liver Function Tests No results for input(s): AST, ALT, ALKPHOS, BILITOT, PROT, ALBUMIN in the last 72 hours. No results for input(s): LIPASE, AMYLASE in the last 72 hours. Cardiac Enzymes No results for input(s): CKTOTAL, CKMB, CKMBINDEX, TROPONINI in the last 72 hours.  BNP: BNP (last 3 results) No results for input(s): BNP in the last 8760 hours.  ProBNP (last 3 results) No results for input(s): PROBNP in the last 8760 hours.   D-Dimer No results for input(s): DDIMER in the last 72 hours. Hemoglobin A1C Recent Labs    10/04/18 0531  HGBA1C 5.8*   Fasting Lipid Panel No results for input(s): CHOL, HDL, LDLCALC, TRIG, CHOLHDL, LDLDIRECT in the last 72 hours. Thyroid Function Tests No results for input(s): TSH, T4TOTAL, T3FREE, THYROIDAB in the last 72 hours.  Invalid input(s): FREET3  Other results:   Imaging    No results found.   Medications:     Scheduled Medications: . apixaban  5 mg  Oral BID  . aspirin EC  81 mg Oral Daily   Or  . aspirin  81 mg Per Tube Daily  . bisacodyl  10 mg Oral Daily   Or  . bisacodyl  10 mg Rectal Daily  . buPROPion  300 mg Oral Daily  . Chlorhexidine Gluconate Cloth  6 each Topical Daily  . insulin aspart  0-15 Units Subcutaneous TID WC  . insulin aspart  0-5 Units Subcutaneous QHS  . insulin detemir  12 Units Subcutaneous Daily  . loratadine  10 mg Oral Daily  . mouth rinse  15 mL Mouth Rinse BID  . pantoprazole  40 mg Oral Daily  . potassium chloride  20 mEq Oral Daily  . rosuvastatin  20 mg Oral Daily  . sodium chloride flush  10-40 mL Intracatheter Q12H  . sodium chloride flush  3 mL  Intravenous Q12H  . spironolactone  12.5 mg Oral Daily    Infusions: . amiodarone 30 mg/hr (10/05/18 0800)  . milrinone 0.125 mcg/kg/min (10/05/18 0800)    PRN Medications: metoprolol tartrate, morphine injection, ondansetron (ZOFRAN) IV, oxyCODONE, potassium chloride, sodium chloride flush, sodium chloride flush, traMADol    Patient Profile   Doris Jefferson is a 77 y.o. female with Chronic diastolic CHF, HTN, CAD, PAF, h/o OSA on CPAP, DM 2, HLD, moderate AS, and obesity.   Asked to see by [facility] for optimization prior to CABG.  Assessment/Plan   1. Severe 3v CAD - Severe with 75% RCA, 100% Cx, 80% LM and LAD 50% stenosed - S/p CABG x 3 with bioprosthetic AVR and LAA clip on 12/20 - Now POD#8 Doing well. - CR on board  2. Moderate AS - s/p bioprosthetic AVR - wide pulse pressure, seems to be improving.  - Echo 12/26: 50-55%, trivial AI with mean gradient of 11 mmHg, trivial MR, RV mod reduced, mild to mod TR   3. Acute on chronic diastolic CHF - Echo 63/8/75 LVEF 55-60%, Grade 2 DD, Mild/Mod AS, Mod MR, mild LAE, RV mild dilated, Mild TR, PA peak pressure 79 mm Hg.  - Coox 63.3% on milrinone 0.125. Wil stop today - Volume status increasing slightly. Resume po diuretics today - Continue spiro 12.5 mg daily.  - Continue TED hose  4. 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 Suspect unlikely to benefit from pulmonary vasodilators.  - PA systolics were 64-33IRJJ before swan pulled. Echo with evidence of RV overload. - Resume sildenafil 20 tid  4. PAF - Remains in Afib - IV amio restarted 12/27  - Continue Eliquis 5 mg BID (restarted 12/26 pm) - Will need DCCV Monday if she does not convert with IV amio. (LA clipped at surgery so no need for TEE and was in AF < 24 hours when [facility] started)   6. OSA on CPAP - Continue CPAP. No change.    7.  DM 2 - Per TCTS. No change.   8. AKI - Creatinine 0.9 > 1.7 > 1.9 > 1.89 > 1.67 > 1.66 > 1.3 -> 1,25. Suspect ATN. Improved with holding diuretics.   9. Hypokalemia - K 3.7 Supp ordered.  10. Disposition - Will need SNF at DC. She is agreeable.   Can transfer to 4E.  Length of Stay: [place], MD  10/05/2018, 9:12 AM  Advanced Heart Failure Team  Pager [phone] (M-F; [place])  Please contact [facility] for night-coverage after hours (4p -7a ) and weekends on [email or web address]

## 2018-10-05 NOTE — Clinical Social Work Placement (Signed)
   CLINICAL SOCIAL WORK PLACEMENT  NOTE  Date:  10/05/2018  Patient Details  Name: Doris Jefferson MRN: [id number] Date of Birth: Aug 03, 1941  Clinical Social Work is seeking post-discharge placement for this patient at the [place] level of care (*CSW will initial, date and re-position this form in  chart as items are completed):      Patient/family provided with [facility] Work Department's list of facilities offering this level of care within the geographic area requested by the patient (or if unable, by the patient's family).      Patient/family informed of their freedom to choose among providers that offer the needed level of care, that participate in Medicare, Medicaid or managed care program needed by the patient, have an available bed and are willing to accept the patient.      Patient/family informed of [facility]'s ownership interest in [facility] and [facility], as well as of the fact that they are under no obligation to receive care at these facilities.  PASRR submitted to EDS on 10/04/18     PASRR number received on 10/04/18     Existing PASRR number confirmed on       FL2 transmitted to all facilities in geographic area requested by pt/family on 10/05/18     FL2 transmitted to all facilities within larger geographic area on       Patient informed that his/her managed care company has contracts with or will negotiate with certain facilities, including the following:            Patient/family informed of bed offers received.  Patient chooses bed at       Physician recommends and patient chooses bed at      Patient to be transferred to   on  .  Patient to be transferred to facility by       Patient family notified on   of transfer.  Name of family member notified:        PHYSICIAN       Additional Comment:    _______________________________________________ Candie Chroman, LCSW 10/05/2018, 9:03 AM

## 2018-10-05 NOTE — Clinical Social Work Note (Signed)
Clinical Social Work Assessment  Patient Details  Name: Doris Jefferson MRN: [id number] Date of Birth: 1941-03-15  Date of referral:  10/05/18               Reason for consult:  Facility Placement, Discharge Planning                Permission sought to share information with:  Facility Sport and exercise psychologist, Family Supports Permission granted to share information::  Yes, Verbal Permission Granted  Name::     [name]::  SNF's  Relationship::  Husband. Brother and daughter also at bedside.  Contact Information:  [phone]  Housing/Transportation Living arrangements for the past 2 months:  [place] of Information:  Patient, Medical Team, Adult Children, Spouse, Siblings Patient Interpreter Needed:  None Criminal Activity/Legal Involvement Pertinent to Current Situation/Hospitalization:  No - Comment as needed Significant Relationships:  Adult Children, Siblings, Spouse Lives with:  Spouse Do you feel safe going back to the place where you live?  Yes Need for family participation in patient care:  Yes (Comment)  Care giving concerns:  PT recommending SNF placement once medically stable for discharge.   Social Worker assessment / plan:  CSW met with patient. Husband, brother, and daughter at bedside. CSW introduced role and explained that PT recommendations would be discussed. Patient and family stated that MD is also placing a CIR consult. Patient is hopeful for CIR but if not, [name] is first preference SNF. CSW sent referral and left message for admissions coordinator to notify. No further concerns. CSW encouraged patient to contact CSW as needed. CSW will continue to follow patient and her family for support and facilitate discharge to SNF, if needed, once medically stable.  Employment status:  Retired Forensic scientist:  Medicare PT Recommendations:  [place] / Referral to community resources:  [place]  Patient/Family's Response to care:  Patient and her family agreeable to SNF if she cannot go to [name]. Patient's family supportive and involved in patient's care. Patient and her family appreciated social work intervention.  Patient/Family's Understanding of and Emotional Response to Diagnosis, Current Treatment, and Prognosis:  Patient and her family have a good understanding of the reason for admission and her need for rehab prior to returning home. Patient and her family appear happy with hospital care.  Emotional Assessment Appearance:  Appears stated age Attitude/Demeanor/Rapport:  Engaged, Gracious Affect (typically observed):  Accepting, Appropriate, Calm, Pleasant Orientation:  Oriented to Self, Oriented to Place, Oriented to  Time, Oriented to Situation Alcohol / Substance use:  Never Used Psych involvement (Current and /or in the community):  No (Comment)  Discharge Needs  Concerns to be addressed:  Care Coordination Readmission within the last 30 days:  No Current discharge risk:  Dependent with Mobility Barriers to Discharge:  Continued Medical Work up   Candie Chroman, LCSW 10/05/2018, 9:00 AM

## 2018-10-05 NOTE — Plan of Care (Signed)
  Problem: Education: Goal: Knowledge of General Education information will improve Description: Including pain rating scale, medication(s)/side effects and non-pharmacologic comfort measures Outcome: Progressing   Problem: Education: Goal: Will demonstrate proper wound care and an understanding of methods to prevent future damage Outcome: Progressing Goal: Knowledge of disease or condition will improve Outcome: Progressing Goal: Knowledge of the prescribed therapeutic regimen will improve Outcome: Progressing Goal: Individualized Educational Video(s) Outcome: Progressing   Problem: Activity: Goal: Risk for activity intolerance will decrease Outcome: Progressing   Problem: Cardiac: Goal: Will achieve and/or maintain hemodynamic stability Outcome: Progressing   Problem: Clinical Measurements: Goal: Postoperative complications will be avoided or minimized Outcome: Progressing   Problem: Respiratory: Goal: Respiratory status will improve Outcome: Progressing   Problem: Skin Integrity: Goal: Wound healing without signs and symptoms of infection Outcome: Progressing Goal: Risk for impaired skin integrity will decrease Outcome: Progressing   Problem: Urinary Elimination: Goal: Ability to achieve and maintain adequate renal perfusion and functioning will improve Outcome: Progressing   

## 2018-10-05 NOTE — Progress Notes (Signed)
      [address]       [place] [address]             [phone]      8 Days Post-Op Procedure(s) (LRB): CORONARY ARTERY BYPASS GRAFTING (CABG) x three, using left internal mammary artery and right leg greater saphenous vein harvested endoscopically (N/A) AORTIC VALVE REPLACEMENT (AVR) USING MAGNA EASE SIZE 21 MM (N/A) CLIPPING OF ATRIAL APPENDAGE USING ATRICURE FLEXV SIZE 35MM (N/A) TRANSESOPHAGEAL ECHOCARDIOGRAM (TEE) (N/A)   Subjective:  No new complaints.  Feels okay.  Asking about discharge plans.  Objective: Vital signs in last 24 hours: Temp:  [98 F (36.7 C)-98.8 F (37.1 C)] 98 F (36.7 C) (12/28 1229) Pulse Rate:  [92-113] 104 (12/28 1400) Cardiac Rhythm: Atrial fibrillation (12/28 1200) Resp:  [15-35] 30 (12/28 1400) BP: (96-156)/(30-130) 115/52 (12/28 1400) SpO2:  [95 %-100 %] 98 % (12/28 1400) Weight:  [96.4 kg] 96.4 kg (12/28 0500)  Hemodynamic parameters for last 24 hours: CVP:  [4 mmHg-11 mmHg] 4 mmHg  Intake/Output from previous day: 12/27 0701 - 12/28 0700 In: 2061.9 [P.O.:1250; I.V.:811.9] Out: 1050 [Urine:1050] Intake/Output this shift: Total I/O In: 656.2 [P.O.:480; I.V.:176.2] Out: 900 [Urine:900]  General appearance: alert, cooperative, no distress and morbidly obese Heart: irregularly irregular rhythm Lungs: diminished breath sounds bibasilar Abdomen: soft, non-tender; bowel sounds normal; no masses,  no organomegaly Extremities: edema trace Wound: clean and dry  Lab Results: Recent Labs    10/04/18 0531 10/05/18 0456  WBC 11.8* 10.7*  HGB 10.1* 9.7*  HCT 31.3* 31.7*  PLT 213 224   BMET:  Recent Labs    10/04/18 0531 10/05/18 0456  NA 137 138  K 3.5 3.7  CL 104 104  CO2 22 21*  GLUCOSE 133* 249*  BUN 39* 31*  CREATININE 1.31* 1.25*  CALCIUM 8.0* 7.7*    PT/INR: No results for input(s): LABPROT, INR in the last 72 hours. ABG    Component Value Date/Time   PHART 7.345 (L) 09/28/2018 1109   HCO3  21.5 09/28/2018 1109   TCO2 23 09/28/2018 1629   ACIDBASEDEF 4.0 (H) 09/28/2018 1109   O2SAT 63.3 10/05/2018 0510   CBG (last 3)  Recent Labs    10/04/18 2148 10/05/18 0626 10/05/18 1223  GLUCAP 175* 184* 248*    Assessment/Plan: S/P Procedure(s) (LRB): CORONARY ARTERY BYPASS GRAFTING (CABG) x three, using left internal mammary artery and right leg greater saphenous vein harvested endoscopically (N/A) AORTIC VALVE REPLACEMENT (AVR) USING MAGNA EASE SIZE 21 MM (N/A) CLIPPING OF ATRIAL APPENDAGE USING ATRICURE FLEXV SIZE 35MM (N/A) TRANSESOPHAGEAL ECHOCARDIOGRAM (TEE) (N/A)  1. CV- Atrial Fibrillation, on IV Amiodarone, Eliquis- started on Sildenafil for PH by AHF today, off Milrinone drip, planning for cardioversion Monday if she doesn't convert to NSR 2. Pulm- no acute issues, continue IS 3. Renal- creatinine continues to improve, down to 1.25, K is at 3.7 continue Lasix, Spironolactone 4. DM- sugars controlled, A1c is 5.8, will stop CBGS insulin coverage 5. Deconditioning- worsened by Morbid obesity, recent surgery, CIR consult has been placed, continue PT/OT 6. DIspo- patient stable, off Milrinone drip, remains in Atrial Fibrillation on IV amiodarone, continue Eliquis with possible cardioversion Monday if she doesn't convert to NSR, started on Sildenafil for PH by AHF, transfer to 4E   LOS: 11 days    Ellwood Handler 10/05/2018

## 2018-10-05 NOTE — Progress Notes (Signed)
Patient transferred to [place] room 9, [place] notified x2, patient received a bath and CHG bath, RN verified skin assessment with Advertising copywriter. Will continue to monitor.

## 2018-10-06 ENCOUNTER — Inpatient Hospital Stay (HOSPITAL_COMMUNITY): Payer: Medicare Other

## 2018-10-06 DIAGNOSIS — D72829 Elevated white blood cell count, unspecified: Secondary | ICD-10-CM

## 2018-10-06 DIAGNOSIS — I480 Paroxysmal atrial fibrillation: Secondary | ICD-10-CM

## 2018-10-06 DIAGNOSIS — F32A Depression, unspecified: Secondary | ICD-10-CM

## 2018-10-06 DIAGNOSIS — I25810 Atherosclerosis of coronary artery bypass graft(s) without angina pectoris: Secondary | ICD-10-CM

## 2018-10-06 DIAGNOSIS — D62 Acute posthemorrhagic anemia: Secondary | ICD-10-CM

## 2018-10-06 DIAGNOSIS — R0682 Tachypnea, not elsewhere classified: Secondary | ICD-10-CM

## 2018-10-06 DIAGNOSIS — I10 Essential (primary) hypertension: Secondary | ICD-10-CM

## 2018-10-06 DIAGNOSIS — E669 Obesity, unspecified: Secondary | ICD-10-CM

## 2018-10-06 DIAGNOSIS — R931 Abnormal findings on diagnostic imaging of heart and coronary circulation: Secondary | ICD-10-CM

## 2018-10-06 DIAGNOSIS — F329 Major depressive disorder, single episode, unspecified: Secondary | ICD-10-CM

## 2018-10-06 DIAGNOSIS — I272 Pulmonary hypertension, unspecified: Secondary | ICD-10-CM

## 2018-10-06 DIAGNOSIS — E1169 Type 2 diabetes mellitus with other specified complication: Secondary | ICD-10-CM

## 2018-10-06 LAB — BASIC METABOLIC PANEL
Anion gap: 10 (ref 5–15)
BUN: 26 mg/dL — ABNORMAL HIGH (ref 8–23)
CHLORIDE: 105 mmol/L (ref 98–111)
CO2: 22 mmol/L (ref 22–32)
CREATININE: 1.27 mg/dL — AB (ref 0.44–1.00)
Calcium: 8.1 mg/dL — ABNORMAL LOW (ref 8.9–10.3)
GFR calc Af Amer: 47 mL/min — ABNORMAL LOW (ref >60)
GFR calc non Af Amer: 41 mL/min — ABNORMAL LOW (ref >60)
Glucose, Bld: 158 mg/dL — ABNORMAL HIGH (ref 70–99)
Potassium: 4 mmol/L (ref 3.5–5.1)
Sodium: 137 mmol/L (ref 135–145)

## 2018-10-06 LAB — CBC
HEMATOCRIT: 31.6 % — AB (ref 36.0–46.0)
Hemoglobin: 10 g/dL — ABNORMAL LOW (ref 12.0–15.0)
MCH: 29.2 pg (ref 26.0–34.0)
MCHC: 31.6 g/dL (ref 30.0–36.0)
MCV: 92.1 fL (ref 80.0–100.0)
Platelets: 258 10*3/uL (ref 150–400)
RBC: 3.43 MIL/uL — ABNORMAL LOW (ref 3.87–5.11)
RDW: 15.3 % (ref 11.5–15.5)
WBC: 12.1 10*3/uL — ABNORMAL HIGH (ref 4.0–10.5)
nRBC: 0 % (ref 0.0–0.2)

## 2018-10-06 LAB — COOXEMETRY PANEL
Carboxyhemoglobin: 1.2 % (ref 0.5–1.5)
Methemoglobin: 1.7 % — ABNORMAL HIGH (ref 0.0–1.5)
O2 Saturation: 59.3 %
Total hemoglobin: 10.4 g/dL — ABNORMAL LOW (ref 12.0–16.0)

## 2018-10-06 LAB — GLUCOSE, CAPILLARY
Glucose-Capillary: 182 mg/dL — ABNORMAL HIGH (ref 70–99)
Glucose-Capillary: 261 mg/dL — ABNORMAL HIGH (ref 70–99)
Glucose-Capillary: 164 mg/dL — ABNORMAL HIGH (ref 70–99)
Glucose-Capillary: 172 mg/dL — ABNORMAL HIGH (ref 70–99)

## 2018-10-06 MED ORDER — GLIMEPIRIDE 4 MG PO TABS
4.0000 mg | ORAL_TABLET | Freq: Every day | ORAL | Status: DC
  Administered 2018-10-07 – 2018-10-09 (×3): 4 mg via ORAL
  Filled 2018-10-06 (×4): qty 1

## 2018-10-06 MED ORDER — INSULIN ASPART 100 UNIT/ML ~~LOC~~ SOLN
0.0000 [IU] | Freq: Three times a day (TID) | SUBCUTANEOUS | Status: DC
  Administered 2018-10-06: 8 [IU] via SUBCUTANEOUS
  Administered 2018-10-06 – 2018-10-07 (×2): 3 [IU] via SUBCUTANEOUS
  Administered 2018-10-07: 5 [IU] via SUBCUTANEOUS
  Administered 2018-10-08 (×2): 3 [IU] via SUBCUTANEOUS
  Administered 2018-10-08: 2 [IU] via SUBCUTANEOUS

## 2018-10-06 MED ORDER — SODIUM CHLORIDE 0.9% FLUSH
3.0000 mL | INTRAVENOUS | Status: DC | PRN
  Administered 2018-10-07 – 2018-10-08 (×2): 3 mL via INTRAVENOUS
  Filled 2018-10-06 (×2): qty 3

## 2018-10-06 MED ORDER — SODIUM CHLORIDE 0.9 % IV SOLN
250.0000 mL | INTRAVENOUS | Status: DC
  Administered 2018-10-07: 08:00:00 via INTRAVENOUS

## 2018-10-06 MED ORDER — METFORMIN HCL 500 MG PO TABS
500.0000 mg | ORAL_TABLET | Freq: Two times a day (BID) | ORAL | Status: DC
  Administered 2018-10-06 – 2018-10-09 (×6): 500 mg via ORAL
  Filled 2018-10-06 (×6): qty 1

## 2018-10-06 MED ORDER — SODIUM CHLORIDE 0.9% FLUSH
3.0000 mL | Freq: Two times a day (BID) | INTRAVENOUS | Status: DC
  Administered 2018-10-07 – 2018-10-08 (×3): 3 mL via INTRAVENOUS

## 2018-10-06 MED ORDER — AMIODARONE HCL 200 MG PO TABS
200.0000 mg | ORAL_TABLET | Freq: Two times a day (BID) | ORAL | Status: DC
  Administered 2018-10-06 – 2018-10-09 (×7): 200 mg via ORAL
  Filled 2018-10-06 (×7): qty 1

## 2018-10-06 NOTE — Progress Notes (Addendum)
      [address]       [place] [address]             [phone]      9 Days Post-Op Procedure(s) (LRB): CORONARY ARTERY BYPASS GRAFTING (CABG) x three, using left internal mammary artery and right leg greater saphenous vein harvested endoscopically (N/A) AORTIC VALVE REPLACEMENT (AVR) USING MAGNA EASE SIZE 21 MM (N/A) CLIPPING OF ATRIAL APPENDAGE USING ATRICURE FLEXV SIZE 35MM (N/A) TRANSESOPHAGEAL ECHOCARDIOGRAM (TEE) (N/A)   Subjective:  No new complaints.  + ambulation with assistance.   + BM  Objective: Vital signs in last 24 hours: Temp:  [97.7 F (36.5 C)-98.5 F (36.9 C)] 98 F (36.7 C) (12/29 0741) Pulse Rate:  [57-113] 101 (12/29 0741) Cardiac Rhythm: Atrial fibrillation (12/28 1950) Resp:  [21-31] 27 (12/29 0741) BP: (94-130)/(30-89) 123/59 (12/29 0741) SpO2:  [95 %-100 %] 98 % (12/29 0741) Weight:  [96.9 kg-97.3 kg] 96.9 kg (12/29 0400)  Hemodynamic parameters for last 24 hours: CVP:  [4 mmHg-7 mmHg] 4 mmHg  Intake/Output from previous day: 12/28 0701 - 12/29 0700 In: 806.4 [P.O.:480; I.V.:326.4] Out: 900 [Urine:900]  General appearance: alert, cooperative and no distress Heart: irregularly irregular rhythm Lungs: clear to auscultation bilaterally Abdomen: soft, non-tender; bowel sounds normal; no masses,  no organomegaly Extremities: edema trace Wound: clean and dry  Lab Results: Recent Labs    10/05/18 0456 10/06/18 0410  WBC 10.7* 12.1*  HGB 9.7* 10.0*  HCT 31.7* 31.6*  PLT 224 258   BMET:  Recent Labs    10/05/18 0456 10/06/18 0410  NA 138 137  K 3.7 4.0  CL 104 105  CO2 21* 22  GLUCOSE 249* 158*  BUN 31* 26*  CREATININE 1.25* 1.27*  CALCIUM 7.7* 8.1*    PT/INR: No results for input(s): LABPROT, INR in the last 72 hours. ABG    Component Value Date/Time   PHART 7.345 (L) 09/28/2018 1109   HCO3 21.5 09/28/2018 1109   TCO2 23 09/28/2018 1629   ACIDBASEDEF 4.0 (H) 09/28/2018 1109   O2SAT 59.3 10/06/2018  0441   CBG (last 3)  Recent Labs    10/05/18 1623 10/05/18 2119 10/06/18 0625  GLUCAP 184* 178* 182*    Assessment/Plan: S/P Procedure(s) (LRB): CORONARY ARTERY BYPASS GRAFTING (CABG) x three, using left internal mammary artery and right leg greater saphenous vein harvested endoscopically (N/A) AORTIC VALVE REPLACEMENT (AVR) USING MAGNA EASE SIZE 21 MM (N/A) CLIPPING OF ATRIAL APPENDAGE USING ATRICURE FLEXV SIZE 35MM (N/A) TRANSESOPHAGEAL ECHOCARDIOGRAM (TEE) (N/A)  1. CV- Atrial Fibrillation, rate controlled-remains on IV Amiodarone- will transition to oral Amiodarone, continue Sildenafil for PH, planning for cardioversion tomorrow if doesn't convert to NSR, continue Eliquis 2. Pulm- no acute issues, continue IS 3. Renal- creatinine stable, K is WNL, on Spironolactone 4. Expected post operative blood loss anemia, stable at 10.0 5. DM- sugars mostly controlled, will restart home Amaryl, SSIP 6. Dispo- patient stable, transition to oral Amiodarone, continue Eliquis, Sildenafil, Spironolactone, for cardioversion tomorrow if remains in A. Fib, continue current care   LOS: 12 days    Ellwood Handler 10/06/2018   Chart reviewed, patient examined, agree with above. Remains in atrial fib on amiodarone. Tentatively planning DCCV tomorrow. Glucose still 180's. Resume Amaryl and Metformin.

## 2018-10-06 NOTE — Progress Notes (Signed)
Contacted the OR, they stated they could do the cardioversion tomorrow morning at 8 AM in PACU.  Dr. Aundra Dubin stated that he could perform the cardioversion.  Patient is n.p.o. after midnight and orders written.  Rosaria Ferries, PA-C 10/06/2018 1:30 PM Beeper [phone]

## 2018-10-06 NOTE — Progress Notes (Signed)
Notice bed was wet. Checked I.V. site and found that it was leaking from the filter. Patient stated." That something had been leaking today."" Someone had mention it a couple of times today but could not find were leaking was coming from." per patient. Change all I.V. tubing. Cont. To monitor I.V. and tubing. R.N. aware.

## 2018-10-06 NOTE — Plan of Care (Signed)
  Problem: Education: Goal: Knowledge of General Education information will improve Description Including pain rating scale, medication(s)/side effects and non-pharmacologic comfort measures Outcome: Progressing   Problem: Education: Goal: Will demonstrate proper wound care and an understanding of methods to prevent future damage Outcome: Progressing Goal: Knowledge of disease or condition will improve Outcome: Progressing Goal: Knowledge of the prescribed therapeutic regimen will improve Outcome: Progressing Goal: Individualized Educational Video(s) Outcome: Progressing   Problem: Activity: Goal: Risk for activity intolerance will decrease Outcome: Progressing   Problem: Cardiac: Goal: Will achieve and/or maintain hemodynamic stability Outcome: Progressing

## 2018-10-06 NOTE — Consult Note (Signed)
Physical Medicine and Rehabilitation Consult Reason for Consult: Cardiac debility Referring Physician: Freddrick March, PA-C   HPI: [name] is a 77 y.o. female with past medical history of diabetes mellitus, PAF, OSA, morbid obesity, iron deficiency anemia, essential hypertension, pulmonary hypertension, depression presented on 09/24/2018 for CABG.  History taken from chart review and patient.  Patient was seen on 12/12 by cardiology.  Repeat echo showed moderate left ear, moderate TR, and severe pulmonary hypertension.  She complains of shortness of breath with prolonged ambulation and walking up steps.  She was scheduled to have a right heart cath, which showed severely calcified three-vessel disease.  She was recommended to have a CABG x3, which he underwent on 12/20.  Hospital course further complicated by tachypnea, atrial fibrillation, hyperglycemia, leukocytosis, AKI.  She is scheduled to have cardioversion tomorrow.   Review of Systems  Constitutional: Negative for chills and fever.  Cardiovascular: Positive for chest pain (Musculoskeletal). Negative for palpitations.  Musculoskeletal: Positive for back pain. Negative for myalgias.  Neurological: Negative for sensory change, focal weakness, weakness and headaches.  All other systems reviewed and are negative.  Past Medical History:  Diagnosis Date  . Depression   . GERD (gastroesophageal reflux disease)   . Heart murmur   . HTN (hypertension)   . Hyperlipidemia   . Iron deficiency anemia   . Obesity (BMI 30-39.9) 03/04/2014  . OSA on CPAP    severe with AHI 31/hr  . PAF (paroxysmal atrial fibrillation) ([place])   . Pneumonia    "twice; years ago" (09/24/2018)  . Skin cancer    "left neck; burned off"  . Type II diabetes mellitus ([place])    Past Surgical History:  Procedure Laterality Date  . ABDOMINAL HYSTERECTOMY    . ANTERIOR CERVICAL DECOMP/DISCECTOMY FUSION     "cage around my neck; Dr. Erline Jefferson"  .  AORTIC VALVE REPLACEMENT N/A 09/27/2018   Procedure: AORTIC VALVE REPLACEMENT (AVR) USING MAGNA EASE SIZE 21 MM;  Surgeon: Doris Isaac, MD;  Location: [facility];  Service: Open Heart Surgery;  Laterality: N/A;  . BACK SURGERY    . CARDIAC CATHETERIZATION  09/24/2018  . CARDIOVERSION N/A 12/19/2013   Procedure: CARDIOVERSION;  Surgeon: Doris Grooms, MD;  Location: [facility] ENDOSCOPY;  Service: Cardiovascular;  Laterality: N/A;  . CARDIOVERSION N/A 08/16/2015   Procedure: CARDIOVERSION;  Surgeon: Doris Dresser, MD;  Location: [facility];  Service: Cardiovascular;  Laterality: N/A;  . CARDIOVERSION N/A 09/13/2015   Procedure: CARDIOVERSION;  Surgeon: Doris Margarita, MD;  Location: [facility];  Service: Cardiovascular;  Laterality: N/A;  . CATARACT EXTRACTION W/ INTRAOCULAR LENS  IMPLANT, BILATERAL Bilateral   . CLIPPING OF ATRIAL APPENDAGE N/A 09/27/2018   Procedure: CLIPPING OF ATRIAL APPENDAGE USING ATRICURE FLEXV SIZE 35MM;  Surgeon: Doris Isaac, MD;  Location: [facility];  Service: Open Heart Surgery;  Laterality: N/A;  . COLONOSCOPY WITH PROPOFOL N/A 11/13/2017   Procedure: COLONOSCOPY WITH PROPOFOL;  Surgeon: Doris Corner, MD;  Location: [facility] ENDOSCOPY;  Service: Endoscopy;  Laterality: N/A;  ANTIBIOTIC NEEDED BEFORE PROCEDURE  . CORONARY ARTERY BYPASS GRAFT N/A 09/27/2018   Procedure: CORONARY ARTERY BYPASS GRAFTING (CABG) x three, using left internal mammary artery and right leg greater saphenous vein harvested endoscopically;  Surgeon: Doris Isaac, MD;  Location: [facility];  Service: Open Heart Surgery;  Laterality: N/A;  . ESOPHAGOGASTRODUODENOSCOPY (EGD) WITH PROPOFOL N/A 11/13/2017   Procedure: ESOPHAGOGASTRODUODENOSCOPY (EGD) WITH PROPOFOL;  Surgeon: Doris Jefferson,  Doris Doffing, MD;  Location: [facility];  Service: Endoscopy;  Laterality: N/A;  . EYE SURGERY    . RIGHT/LEFT HEART CATH AND CORONARY ANGIOGRAPHY N/A 09/24/2018   Procedure: RIGHT/LEFT HEART CATH AND CORONARY ANGIOGRAPHY;   Surgeon: Doris Booze, MD;  Location: [facility];  Service: Cardiovascular;  Laterality: N/A;  . TEE WITHOUT CARDIOVERSION N/A 09/27/2018   Procedure: TRANSESOPHAGEAL ECHOCARDIOGRAM (TEE);  Surgeon: Doris Isaac, MD;  Location: [facility];  Service: Open Heart Surgery;  Laterality: N/A;  . TUBAL LIGATION     Family History  Problem Relation Age of Onset  . Hypertension Father   . Heart disease Father   . Melanoma Mother   . Hypertension Brother    Social History:  reports that she has never smoked. She has never used smokeless tobacco. She reports that she does not drink alcohol or use drugs. Allergies:  Allergies  Allergen Reactions  . Minocin [Minocycline Hcl] Swelling and Other (See Comments)    THROAT SWELLING  . Benazepril Cough  . Hydrochlorothiazide Itching  . Tape Rash   Medications Prior to Admission  Medication Sig Dispense Refill  . acetaminophen (TYLENOL) 325 MG tablet Take 650 mg by mouth every 6 (six) hours as needed for moderate pain or headache.    [name] amiodarone (PACERONE) 200 MG tablet TAKE 1 TABLET(200 MG) BY MOUTH DAILY (Patient taking differently: Take 200 mg by mouth daily. ) 90 tablet 3  . buPROPion (WELLBUTRIN XL) 300 MG 24 hr tablet Take 300 mg by mouth daily.     [name] diltiazem (CARDIZEM CD) 180 MG 24 hr capsule Take 180 mg by mouth daily.    [name] doxylamine, Sleep, (UNISOM) 25 MG tablet Take 25 mg by mouth at bedtime as needed for sleep.    . fexofenadine (ALLEGRA) 60 MG tablet Take 60 mg by mouth daily as needed for allergies.     . furosemide (LASIX) 20 MG tablet TAKE 1 TABLET BY MOUTH DAILY (Patient taking differently: Take 20 mg by mouth daily. ) 90 tablet 3  . glimepiride (AMARYL) 4 MG tablet Take 4 mg by mouth daily with breakfast.    . liraglutide (VICTOZA) 18 MG/3ML SOPN Inject 1.8 mg into the skin daily.     [name] losartan (COZAAR) 50 MG tablet Take one tablet by mouth by daily (Patient taking differently: Take 50 mg by mouth daily. Take one  tablet by mouth by daily) 90 tablet 0  . MAGNESIUM-OXIDE 400 (241.3 Mg) MG tablet TAKE 1 TABLET BY MOUTH TWICE DAILY (Patient taking differently: Take 400 mg by mouth 2 (two) times daily. ) 180 tablet 3  . metoprolol succinate (TOPROL-XL) 25 MG 24 hr tablet TAKE 1 TABLET(25 MG) BY MOUTH TWICE DAILY (Patient taking differently: Take 25 mg by mouth 2 (two) times daily. ) 180 tablet 3  . rosuvastatin (CRESTOR) 20 MG tablet Take 20 mg by mouth daily.    [name] terazosin (HYTRIN) 5 MG capsule Take 5 mg by mouth at bedtime.    . [DISCONTINUED] ELIQUIS 5 MG TABS tablet TAKE 1 TABLET(5 MG) BY MOUTH TWICE DAILY (Patient taking differently: Take 5 mg by mouth 2 (two) times daily. ) 60 tablet 10  . [DISCONTINUED] metFORMIN (GLUCOPHAGE) 1000 MG tablet Take 1,000 mg by mouth 2 (two) times daily with a meal.      Home: Home Living Family/patient expects to be discharged to:: Private residence Living Arrangements: Spouse/significant other Available Help at Discharge: Family, Available 24 hours/day Type of Home: House  Home Access: Stairs to enter Entrance Stairs-Number of Steps: 3 Home Layout: One level Bathroom Shower/Tub: Multimedia programmer: Standard  Functional History: Prior Function Level of Independence: Independent Functional Status:  Mobility: Bed Mobility Overal bed mobility: Needs Assistance Bed Mobility: Supine to Sit, Sit to Supine Supine to sit: Mod assist, +2 for physical assistance Sit to supine: Mod assist, +2 for physical assistance General bed mobility comments: OOB in chair Transfers Overall transfer level: Needs assistance Equipment used: Rolling walker (2 wheeled) Transfers: Sit to/from Stand Sit to Stand: Min assist General transfer comment: max cues for maintaining sternal precautions. use of momentum to achieve upright Ambulation/Gait Ambulation/Gait assistance: Min assist Gait Distance (Feet): 30 Feet(x2) Assistive device: Rolling walker (2 wheeled) Gait  Pattern/deviations: Step-through pattern, Decreased stride length, Narrow base of support General Gait Details: cues for wider base of support. chair follow utilized Gait velocity: decreased Gait velocity interpretation: <1.31 ft/sec, indicative of household ambulator    ADL:    Cognition: Cognition Overall Cognitive Status: Impaired/Different from baseline Orientation Level: Oriented X4 Cognition Arousal/Alertness: Awake/alert Behavior During Therapy: Flat affect Overall Cognitive Status: Impaired/Different from baseline Area of Impairment: Attention, Memory, Awareness, Safety/judgement, Following commands, Problem solving Orientation Level: Disoriented to, Time Current Attention Level: Selective Memory: Decreased recall of precautions Following Commands: Follows one step commands consistently Awareness: Emergent Problem Solving: Slow processing, Requires verbal cues  Blood pressure (!) 137/46, pulse 96, temperature 98.3 F (36.8 C), temperature source Oral, resp. rate (!) 23, height 5' 3"  (1.6 m), weight 96.9 kg, SpO2 100 %. Physical Exam  Constitutional: She is oriented to person, place, and time. She appears well-developed.  Obesity  HENT:  Head: Normocephalic and atraumatic.  Eyes: EOM are normal. Right eye exhibits no discharge. Left eye exhibits no discharge.  Neck: Normal range of motion. Neck supple.  Cardiovascular:  Irregularly irregular.  Respiratory: Effort normal and breath sounds normal.  GI: Soft. Bowel sounds are normal.  Musculoskeletal:     Comments: No edema or tenderness in extremities  Neurological: She is alert and oriented to person, place, and time.  Motor: Grossly 4+/5 throughout Sensation intact light touch  Skin:  Sternal incision C/D/I  Psychiatric: She has a normal mood and affect. Her behavior is normal. Thought content normal.    Results for orders placed or performed during the hospital encounter of 09/24/18 (from the past 24 hour(s))    Basic metabolic panel     Status: Abnormal   Collection Time: 10/06/18  4:10 AM  Result Value Ref Range   Sodium 137 135 - 145 mmol/L   Potassium 4.0 3.5 - 5.1 mmol/L   Chloride 105 98 - 111 mmol/L   CO2 22 22 - 32 mmol/L   Glucose, Bld 158 (H) 70 - 99 mg/dL   BUN 26 (H) 8 - 23 mg/dL   Creatinine, Ser 1.27 (H) 0.44 - 1.00 mg/dL   Calcium 8.1 (L) 8.9 - 10.3 mg/dL   GFR calc non Af Amer 41 (L) >60 mL/min   GFR calc Af Amer 47 (L) >60 mL/min   Anion gap 10 5 - 15  CBC     Status: Abnormal   Collection Time: 10/06/18  4:10 AM  Result Value Ref Range   WBC 12.1 (H) 4.0 - 10.5 K/uL   RBC 3.43 (L) 3.87 - 5.11 MIL/uL   Hemoglobin 10.0 (L) 12.0 - 15.0 g/dL   HCT 31.6 (L) 36.0 - 46.0 %   MCV 92.1 80.0 - 100.0 fL  MCH 29.2 26.0 - 34.0 pg   MCHC 31.6 30.0 - 36.0 g/dL   RDW 15.3 11.5 - 15.5 %   Platelets 258 150 - 400 K/uL   nRBC 0.0 0.0 - 0.2 %  .Cooxemetry Panel (carboxy, met, total hgb, O2 sat)     Status: Abnormal   Collection Time: 10/06/18  4:41 AM  Result Value Ref Range   Total hemoglobin 10.4 (L) 12.0 - 16.0 g/dL   O2 Saturation 59.3 %   Carboxyhemoglobin 1.2 0.5 - 1.5 %   Methemoglobin 1.7 (H) 0.0 - 1.5 %  Glucose, capillary     Status: Abnormal   Collection Time: 10/06/18  6:25 AM  Result Value Ref Range   Glucose-Capillary 182 (H) 70 - 99 mg/dL  Glucose, capillary     Status: Abnormal   Collection Time: 10/06/18 11:33 AM  Result Value Ref Range   Glucose-Capillary 261 (H) 70 - 99 mg/dL  Glucose, capillary     Status: Abnormal   Collection Time: 10/06/18  4:47 PM  Result Value Ref Range   Glucose-Capillary 164 (H) 70 - 99 mg/dL   Dg Chest 2 View  Result Date: 10/06/2018 CLINICAL DATA:  77 year old female with history of CABG. EXAM: CHEST - 2 VIEW COMPARISON:  Chest x-ray 10/04/2018. FINDINGS: There is a right upper extremity PICC with tip terminating in the proximal superior vena cava. Status post median sternotomy for CABG and left atrial appendage ligation with a  ligation clip noted. Persistent opacity in the left mid lung which may reflect some loculated pleural fluid in the fissure or area of parenchymal scarring. Small to moderate bilateral pleural effusions are noted. Pulmonary venous congestion, with slight indistinctness of the interstitial markings, which may suggest some developing mild interstitial pulmonary edema. Mild enlargement of the cardiopericardial silhouette. Upper mediastinal contours are within normal limits for postoperative patient. Orthopedic fixation hardware in the lower cervical spine incidentally noted. IMPRESSION: 1. There appears to be some developing mild interstitial pulmonary edema. 2. Small to moderate bilateral pleural effusions. 3. Persistent opacity in the left mid lung may reflect some loculated pleural fluid in the fissure or may represent some parenchymal scarring. Electronically Signed   By: Vinnie Langton M.D.   On: 10/06/2018 08:12    Assessment/Plan: Diagnosis: Cardiac debility Labs and images (see above) independently reviewed.  Records reviewed and summated above.  1. Does the need for close, 24 hr/day medical supervision in concert with the patient's rehab needs make it unreasonable for this patient to be served in a less intensive setting? Potentially  2. Co-Morbidities requiring supervision/potential complications: diabetes mellitus (Monitor in accordance with exercise and adjust meds as necessary), PAF (monitor heart rate with increased physical activity, continue meds, plan for cardioversion tomorrow), OSA, morbid obesity, iron deficiency anemia (repeat labs, transfuse to ensure appropriate perfusion for increased activity tolerance), essential HTN (monitor and provide prns in accordance with increased physical exertion and pain), pulmonary hypertension, depression (ensure mood does not hinder progress of therapies), tachypnea (monitor RR and O2 Sats with increased physical exertion), leukocytosis (repeat labs, cont  to monitor for signs and symptoms of infection, further workup if indicated), AKI 3. Due to safety, skin/wound care, disease management, pain management and patient education, does the patient require 24 hr/day rehab nursing? Yes 4. Does the patient require coordinated care of a physician, rehab nurse, PT (1-2 hrs/day, 5 days/week) and OT (1-2 hrs/day, 5 days/week) to address physical and functional deficits in the context of the above medical diagnosis(es)? Yes  Addressing deficits in the following areas: balance, endurance, locomotion, strength, transferring, bathing, dressing, toileting and psychosocial support 5. Can the patient actively participate in an intensive therapy program of at least 3 hrs of therapy per day at least 5 days per week? Potentially 6. The potential for patient to make measurable gains while on inpatient rehab is good 7. Anticipated functional outcomes upon discharge from inpatient rehab are modified independent and supervision  with PT, modified independent and supervision with OT, n/a with SLP. 8. Estimated rehab length of stay to reach the above functional goals is: 13-16 days. 9. Anticipated D/C setting: Home 10. Anticipated post D/C treatments: HH therapy and Home excercise program 11. Overall Rehab/Functional Prognosis: good  RECOMMENDATIONS: This patient's condition is appropriate for continued rehabilitative care in the following setting: CIR if patient demonstrates ability to tolerate 3 hours of therapy per day otherwise recommend SNF for less intense rehab. Patient has agreed to participate in recommended program. Potentially Note that insurance prior authorization may be required for reimbursement for recommended care.  Comment: Rehab Admissions Coordinator to follow up.   Doris Lesch, MD, Maxine Glenn 10/06/2018

## 2018-10-06 NOTE — Progress Notes (Signed)
Advanced Heart Failure Rounding Note  PCP-Cardiologist: Sinclair Grooms, MD   Subjective:    S/p CABG x 3 with bioprosthetic AVR and LAA clip on 12/20  Milrinone stopped yesterday. IV amio restarted due to AF with RVR. Remains in AF rates 100-110  Co-ox 59% this am. Feels good. No SOB, orthopnea or PND.   Back on lasix 40 daily. Weight down 1 pound. Renal function stable.   Echo 12/26: 50-55%, trivial AI with mean gradient of 11 mmHg, trivial MR, RV mod reduced, mild to mod TR  Objective:   Weight Range: 96.9 kg Body mass index is 37.84 kg/m.   Vital Signs:   Temp:  [97.7 F (36.5 C)-98.5 F (36.9 C)] 98 F (36.7 C) (12/29 0741) Pulse Rate:  [57-111] 101 (12/29 0741) Resp:  [21-31] 27 (12/29 0741) BP: (94-130)/(30-89) 124/55 (12/29 0934) SpO2:  [97 %-100 %] 98 % (12/29 0741) Weight:  [96.9 kg-97.3 kg] 96.9 kg (12/29 0400) Last BM Date: 10/03/18  Weight change: Filed Weights   10/05/18 0500 10/05/18 1606 10/06/18 0400  Weight: 96.4 kg 97.3 kg 96.9 kg    Intake/Output:   Intake/Output Summary (Last 24 hours) at 10/06/2018 1036 Last data filed at 10/06/2018 0945 Gross per 24 hour  Intake 716.9 ml  Output 900 ml  Net -183.1 ml      Physical Exam    General:  Sitting in chair No resp difficulty HEENT: normal Neck: supple. JVP 6  Carotids 2+ bilat; no bruits. No lymphadenopathy or thryomegaly appreciated. Cor: PMI nondisplaced. IRR tachy. No rubs, gallops or murmurs. Lungs: clear Abdomen: soft, nontender, nondistended. No hepatosplenomegaly. No bruits or masses. Good bowel sounds. Extremities: no cyanosis, clubbing, rash, edema Neuro: alert & orientedx3, cranial nerves grossly intact. moves all 4 extremities w/o difficulty. Affect pleasant  Telemetry   Afib 100-120s. Personally reviewed.   EKG    No new tracings.    Labs    CBC Recent Labs    10/05/18 0456 10/06/18 0410  WBC 10.7* 12.1*  HGB 9.7* 10.0*  HCT 31.7* 31.6*  MCV 91.9 92.1    PLT 224 786   Basic Metabolic Panel Recent Labs    10/05/18 0456 10/06/18 0410  NA 138 137  K 3.7 4.0  CL 104 105  CO2 21* 22  GLUCOSE 249* 158*  BUN 31* 26*  CREATININE 1.25* 1.27*  CALCIUM 7.7* 8.1*   Liver Function Tests No results for input(s): AST, ALT, ALKPHOS, BILITOT, PROT, ALBUMIN in the last 72 hours. No results for input(s): LIPASE, AMYLASE in the last 72 hours. Cardiac Enzymes No results for input(s): CKTOTAL, CKMB, CKMBINDEX, TROPONINI in the last 72 hours.  BNP: BNP (last 3 results) No results for input(s): BNP in the last 8760 hours.  ProBNP (last 3 results) No results for input(s): PROBNP in the last 8760 hours.   D-Dimer No results for input(s): DDIMER in the last 72 hours. Hemoglobin A1C Recent Labs    10/04/18 0531  HGBA1C 5.8*   Fasting Lipid Panel No results for input(s): CHOL, HDL, LDLCALC, TRIG, CHOLHDL, LDLDIRECT in the last 72 hours. Thyroid Function Tests No results for input(s): TSH, T4TOTAL, T3FREE, THYROIDAB in the last 72 hours.  Invalid input(s): FREET3  Other results:   Imaging    Dg Chest 2 View  Result Date: 10/06/2018 CLINICAL DATA:  77 year old female with history of CABG. EXAM: CHEST - 2 VIEW COMPARISON:  Chest x-ray 10/04/2018. FINDINGS: There is a right upper extremity PICC with  tip terminating in the proximal superior vena cava. Status post median sternotomy for CABG and left atrial appendage ligation with a ligation clip noted. Persistent opacity in the left mid lung which may reflect some loculated pleural fluid in the fissure or area of parenchymal scarring. Small to moderate bilateral pleural effusions are noted. Pulmonary venous congestion, with slight indistinctness of the interstitial markings, which may suggest some developing mild interstitial pulmonary edema. Mild enlargement of the cardiopericardial silhouette. Upper mediastinal contours are within normal limits for postoperative patient. Orthopedic fixation  hardware in the lower cervical spine incidentally noted. IMPRESSION: 1. There appears to be some developing mild interstitial pulmonary edema. 2. Small to moderate bilateral pleural effusions. 3. Persistent opacity in the left mid lung may reflect some loculated pleural fluid in the fissure or may represent some parenchymal scarring. Electronically Signed   By: Vinnie Langton M.D.   On: 10/06/2018 08:12     Medications:     Scheduled Medications: . amiodarone  200 mg Oral BID  . apixaban  5 mg Oral BID  . aspirin EC  81 mg Oral Daily   Or  . aspirin  81 mg Per Tube Daily  . bisacodyl  10 mg Oral Daily   Or  . bisacodyl  10 mg Rectal Daily  . buPROPion  300 mg Oral Daily  . Chlorhexidine Gluconate Cloth  6 each Topical Daily  . furosemide  40 mg Oral Daily  . [START ON 10/07/2018] glimepiride  4 mg Oral Q breakfast  . insulin aspart  0-15 Units Subcutaneous TID WC  . loratadine  10 mg Oral Daily  . mouth rinse  15 mL Mouth Rinse BID  . metFORMIN  500 mg Oral BID WC  . pantoprazole  40 mg Oral Daily  . potassium chloride  20 mEq Oral Daily  . rosuvastatin  20 mg Oral Daily  . sildenafil  20 mg Oral TID  . sodium chloride flush  10-40 mL Intracatheter Q12H  . sodium chloride flush  3 mL Intravenous Q12H  . spironolactone  12.5 mg Oral Daily    Infusions:   PRN Medications: metoprolol tartrate, morphine injection, ondansetron (ZOFRAN) IV, oxyCODONE, sodium chloride flush, sodium chloride flush, traMADol    Patient Profile   Doris [facility] is a 77 y.o. female with Chronic diastolic CHF, HTN, CAD, PAF, h/o OSA on CPAP, DM 2, HLD, moderate AS, and obesity.   Asked to see by [facility] for optimization prior to CABG.  Assessment/Plan   1. Severe 3v CAD - Severe with 75% RCA, 100% Cx, 80% LM and LAD 50% stenosed - S/p CABG x 3 with bioprosthetic AVR and LAA clip on 12/20 - Now POD#9 Doing well. - CR on board  2. Moderate AS - s/p bioprosthetic AVR - Echo 12/26: 50-55%,  trivial AI with mean gradient of 11 mmHg, trivial MR, RV mod reduced, mild to mod TR   3. Acute on chronic diastolic CHF - Echo 26/7/12 LVEF 55-60%, Grade 2 DD, Mild/Mod AS, Mod MR, mild LAE, RV mild dilated, Mild TR, PA peak pressure 79 mm Hg.  - Coox 59%. Milrinone stopped 12/28 - Volume status stable on lasix 40 daily - Continue spiro 12.5 mg daily.  - Continue TED hose  4. 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 Suspect unlikely to benefit from pulmonary  vasodilators.  - PA systolics were 88-41YSAY before swan pulled. Echo with evidence of RV overload. - Resume sildenafil 20 tid  4. PAF - Remains in Afib with RVR - IV amio restarted 12/27  - Continue Eliquis 5 mg BID (restarted 12/26 pm) - Will need DCCV tomorrow. (LA clipped at surgery so no need for TEE and was in AF < 24 hours when [facility] started)  - Order placed   6. OSA on CPAP - Continue CPAP. No change.    7. DM 2 - Per TCTS. No change.   8. AKI - Creatinine 0.9 > 1.7 > 1.9 > 1.89 > 1.67 > 1.66 > 1.3 -> 1.25 -> 1.27. Suspect ATN. Improved with holding diuretics.   9. Hypokalemia - K 4.0 Supp ordered.  10. Disposition - Will need SNF at DC. She is agreeable.    Length of Stay: [place], MD  10/06/2018, 10:36 AM  Advanced Heart Failure Team Pager [phone] (M-F; 7a - 4p)  Please contact [facility] for night-coverage after hours (4p -7a ) and weekends on [email or web address]

## 2018-10-07 ENCOUNTER — Inpatient Hospital Stay (HOSPITAL_COMMUNITY): Payer: Medicare Other | Admitting: Anesthesiology

## 2018-10-07 ENCOUNTER — Encounter (HOSPITAL_COMMUNITY): Payer: Self-pay | Admitting: Anesthesiology

## 2018-10-07 ENCOUNTER — Ambulatory Visit (HOSPITAL_COMMUNITY): Admission: RE | Admit: 2018-10-07 | Payer: Medicare Other | Source: Ambulatory Visit

## 2018-10-07 ENCOUNTER — Encounter (HOSPITAL_COMMUNITY)
Admission: RE | Disposition: A | Discharge status: Home Health | Payer: Self-pay | Source: Home / Self Care | Attending: Interventional Cardiology

## 2018-10-07 ENCOUNTER — Encounter (HOSPITAL_COMMUNITY): Payer: Self-pay

## 2018-10-07 ENCOUNTER — Encounter (HOSPITAL_COMMUNITY): Admission: RE | Admit: 2018-10-07 | Payer: Medicare Other | Source: Ambulatory Visit

## 2018-10-07 DIAGNOSIS — I4819 Other persistent atrial fibrillation: Secondary | ICD-10-CM

## 2018-10-07 HISTORY — PX: CARDIOVERSION: SHX1299

## 2018-10-07 LAB — BASIC METABOLIC PANEL
Anion gap: 8 (ref 5–15)
BUN: 25 mg/dL — ABNORMAL HIGH (ref 8–23)
CO2: 23 mmol/L (ref 22–32)
Calcium: 7 mg/dL — ABNORMAL LOW (ref 8.9–10.3)
Chloride: 106 mmol/L (ref 98–111)
Creatinine, Ser: 1.29 mg/dL — ABNORMAL HIGH (ref 0.44–1.00)
GFR calc Af Amer: 46 mL/min — ABNORMAL LOW (ref >60)
GFR, EST NON AFRICAN AMERICAN: 40 mL/min — AB (ref >60)
GLUCOSE: 182 mg/dL — AB (ref 70–99)
POTASSIUM: 4.6 mmol/L (ref 3.5–5.1)
Sodium: 137 mmol/L (ref 135–145)

## 2018-10-07 LAB — CBC
HCT: 30.9 % — ABNORMAL LOW (ref 36.0–46.0)
HEMOGLOBIN: 9.8 g/dL — AB (ref 12.0–15.0)
MCH: 29.4 pg (ref 26.0–34.0)
MCHC: 31.7 g/dL (ref 30.0–36.0)
MCV: 92.8 fL (ref 80.0–100.0)
Platelets: 243 10*3/uL (ref 150–400)
RBC: 3.33 MIL/uL — ABNORMAL LOW (ref 3.87–5.11)
RDW: 15.6 % — ABNORMAL HIGH (ref 11.5–15.5)
WBC: 11.7 10*3/uL — ABNORMAL HIGH (ref 4.0–10.5)
nRBC: 0 % (ref 0.0–0.2)

## 2018-10-07 LAB — GLUCOSE, CAPILLARY
Glucose-Capillary: 184 mg/dL — ABNORMAL HIGH (ref 70–99)
Glucose-Capillary: 230 mg/dL — ABNORMAL HIGH (ref 70–99)
Glucose-Capillary: 118 mg/dL — ABNORMAL HIGH (ref 70–99)
Glucose-Capillary: 158 mg/dL — ABNORMAL HIGH (ref 70–99)

## 2018-10-07 LAB — COOXEMETRY PANEL
Carboxyhemoglobin: 1.4 % (ref 0.5–1.5)
Methemoglobin: 1.3 % (ref 0.0–1.5)
O2 Saturation: 60.3 %
Total hemoglobin: 10 g/dL — ABNORMAL LOW (ref 12.0–16.0)

## 2018-10-07 SURGERY — CARDIOVERSION
Anesthesia: General

## 2018-10-07 MED ORDER — PROPOFOL 10 MG/ML IV BOLUS
INTRAVENOUS | Status: DC | PRN
  Administered 2018-10-07: 40 mg via INTRAVENOUS

## 2018-10-07 MED ORDER — CARVEDILOL 3.125 MG PO TABS
3.1250 mg | ORAL_TABLET | Freq: Two times a day (BID) | ORAL | Status: DC
  Administered 2018-10-07 – 2018-10-09 (×4): 3.125 mg via ORAL
  Filled 2018-10-07 (×4): qty 1

## 2018-10-07 MED ORDER — LIDOCAINE 2% (20 MG/ML) 5 ML SYRINGE
INTRAMUSCULAR | Status: DC | PRN
  Administered 2018-10-07: 60 mg via INTRAVENOUS

## 2018-10-07 NOTE — Transfer of Care (Signed)
Immediate Anesthesia Transfer of Care Note  Patient: Doris Jefferson  Procedure(s) Performed: CARDIOVERSION (N/A )  Patient Location: PACU  Anesthesia Type:General  Level of Consciousness: awake, alert  and oriented  Airway & Oxygen Therapy: Patient Spontanous Breathing  Post-op Assessment: Report given to RN and Post -op Vital signs reviewed and stable  Post vital signs: Reviewed and stable  Last Vitals:  Vitals Value Taken Time  BP    Temp    Pulse    Resp    SpO2      Last Pain:  Vitals:   10/07/18 0746  TempSrc: Oral  PainSc: 0-No pain      Patients Stated Pain Goal: 0 (62/44/69 5072)  Complications: No apparent anesthesia complications

## 2018-10-07 NOTE — Progress Notes (Signed)
Physical Therapy Treatment Patient Details Name: Doris [facility] MRN: [id number] DOB: 30-Aug-1941 Today's Date: 10/07/2018    History of Present Illness Patient is a 77 y.o. female with Chronic diastolic CHF, HTN, CAD, PAF, h/o OSA on CPAP, DM 2, HLD, moderate AS, and obesity. S/p CABG x 3 with bioprosthetic AVR and LAA clip on 12/20    PT Comments    Patient with noted improvements in activity tolerance and function. Ambulating in hall with VSS, performed stair negotiation. At this time, recommendations updated. HHPT and supervision upon discharge, family able to provide. Recommend 4 wheeled rollator for mobility.   Follow Up Recommendations  Home health PT;Supervision for mobility/OOB     Equipment Recommendations  (rollator)    Recommendations for Other Services       Precautions / Restrictions Precautions Precautions: Fall Precaution Comments: sternal; wound vac in place Restrictions Weight Bearing Restrictions: Yes(sternal precautions)    Mobility  Bed Mobility               General bed mobility comments: OOB in chair  Transfers Overall transfer level: Needs assistance Equipment used: Rolling walker (2 wheeled) Transfers: Sit to/from Stand Sit to Stand: Supervision         General transfer comment: no physical assist required  Ambulation/Gait Ambulation/Gait assistance: Supervision Gait Distance (Feet): 380 Feet Assistive device: Rolling walker (2 wheeled) Gait Pattern/deviations: Step-through pattern;Decreased stride length;Narrow base of support Gait velocity: decreased Gait velocity interpretation: <1.8 ft/sec, indicate of risk for recurrent falls     Stairs Stairs: Yes Stairs assistance: Min guard Stair Management: One rail Left;Step to pattern Number of Stairs: 2     Wheelchair Mobility    Modified Rankin (Stroke Patients Only)       Balance Overall balance assessment: Needs assistance Sitting-balance support: Feet  supported Sitting balance-Leahy Scale: Good Sitting balance - Comments: hands on assist for sitting at EOB   Standing balance support: During functional activity;Bilateral upper extremity supported Standing balance-Leahy Scale: Poor                              Cognition Arousal/Alertness: Awake/alert Behavior During Therapy: WFL for tasks assessed/performed Overall Cognitive Status: Within Functional Limits for tasks assessed                                        Exercises      General Comments        Pertinent Vitals/Pain Pain Assessment: No/denies pain    Home Living                      Prior Function            PT Goals (current goals can now be found in the care plan section) Acute Rehab PT Goals Patient Stated Goal: to get better PT Goal Formulation: With patient/family Time For Goal Achievement: 10/14/18 Potential to Achieve Goals: Good Progress towards PT goals: Progressing toward goals    Frequency    Min 3X/week      PT Plan Discharge plan needs to be updated    Co-evaluation              AM-PAC PT "6 Clicks" Mobility   Outcome Measure  Help needed turning from your back to your side while in a flat bed without using bedrails?: A  Little Help needed moving from lying on your back to sitting on the side of a flat bed without using bedrails?: A Little Help needed moving to and from a bed to a chair (including a wheelchair)?: A Little Help needed standing up from a chair using your arms (e.g., wheelchair or bedside chair)?: A Little Help needed to walk in hospital room?: A Little Help needed climbing 3-5 steps with a railing? : A Little 6 Click Score: 18    End of Session Equipment Utilized During Treatment: Gait belt Activity Tolerance: Patient tolerated treatment well Patient left: with call bell/phone within reach;with family/visitor present;in chair Nurse Communication: Mobility status PT Visit  Diagnosis: Difficulty in walking, not elsewhere classified (R26.2);Muscle weakness (generalized) (M62.81)     Time: 5456-2563 PT Time Calculation (min) (ACUTE ONLY): 20 min  Charges:  $Gait Training: 8-22 mins                     Alben Deeds, PT DPT  Board Certified Neurologic Specialist [place] Pager [phone] Office [place] 10/07/2018, 1:31 PM

## 2018-10-07 NOTE — Progress Notes (Signed)
PT Cancellation Note  Patient Details Name: Doris [facility] MRN: [id number] DOB: 1941-05-25   Cancelled Treatment:    Reason Eval/Treat Not Completed: Patient at procedure or test/unavailable(OTF)   Duncan Dull 10/07/2018, 7:45 AM

## 2018-10-07 NOTE — Care Management (Signed)
#   1.   [facility]   # [facility] RX #   [phone]  1. SILDENAFIL  TABS  20 MG  3 X A DAY COVER-  NOT  COVER PRIOR APPROVAL-  YES   # [phone]   2. REVATIO  TABS  20 MG  3 X A DAY COVER- YES CO-PAY- $ 47.00 TIER- NONE FORMULARY PRIOR APPROVAL- YES # [phone]  PREFERRED PHARMACY : YES [facility] AND [facility]

## 2018-10-07 NOTE — Progress Notes (Signed)
1400-1415 NSR 81. Pt just back from bathroom and tired. Walked with PT earlier and tolerated well. Stated too tired to walk now and will walk later with staff. Offered to educate since family here but pt prefers tomorrow because of fatigue. Stated has been using IS and getting about 1740ml.. Will follow up tomorrow.  Graylon Good RN BSN 10/07/2018 2:10 PM

## 2018-10-07 NOTE — Progress Notes (Addendum)
Advanced Heart Failure Rounding Note  PCP-Cardiologist: Sinclair Grooms, MD   Subjective:    S/p CABG x 3 with bioprosthetic AVR and LAA clip on 12/20  Milrinone stopped 12/28. Coox 60%.  S/p successful DCCV 12/30.   On PO lasix. Weight down 4 lbs (1 lb over pre-op weight). Creatinine stable 1.29.  She feels great today. Can already tell a difference with being in NSR. Denies CP or SOB. Ambulating in hallways with walker. Consider CIR vs SNF vs HH per patient. PT/OT has not seen in several days.   Echo 12/26: 50-55%, trivial AI with mean gradient of 11 mmHg, trivial MR, RV mod reduced, mild to mod TR  Objective:   Weight Range: 94.3 kg Body mass index is 36.85 kg/m.   Vital Signs:   Temp:  [98.1 F (36.7 C)-98.5 F (36.9 C)] 98.2 F (36.8 C) (12/30 0824) Pulse Rate:  [67-116] 70 (12/30 0840) Resp:  [20-25] 20 (12/30 0840) BP: (106-149)/(35-64) 118/44 (12/30 0840) SpO2:  [96 %-100 %] 98 % (12/30 0840) Weight:  [94.3 kg-96.5 kg] 94.3 kg (12/30 0746) Last BM Date: 10/06/18  Weight change: Filed Weights   10/06/18 0400 10/07/18 0442 10/07/18 0746  Weight: 96.9 kg 96.5 kg 94.3 kg    Intake/Output:   Intake/Output Summary (Last 24 hours) at 10/07/2018 0853 Last data filed at 10/07/2018 0834 Gross per 24 hour  Intake 1026.67 ml  Output 0 ml  Net 1026.67 ml      Physical Exam    General: Elderly. No resp difficulty. Sitting in chair. HEENT: Normal Neck: Supple. JVP 7-8. Carotids 2+ bilat; no bruits. No thyromegaly or nodule noted. Cor: PMI nondisplaced. RRR, 2/6 TR. Midsternal incision. Lungs: CTAB, normal effort. Abdomen: Soft, non-tender, non-distended, no HSM. No bruits or masses. +BS  Extremities: No cyanosis, clubbing, or rash. R and LLE no edema. BLE TED hose.  Neuro: Alert & orientedx3, cranial nerves grossly intact. moves all 4 extremities w/o difficulty. Affect pleasant  Telemetry   Afib 120s, now in NSR 70s. Personally reviewed.   EKG    EKG today NSR 70 bpm.   Labs    CBC Recent Labs    10/06/18 0410 10/07/18 0432  WBC 12.1* 11.7*  HGB 10.0* 9.8*  HCT 31.6* 30.9*  MCV 92.1 92.8  PLT 258 761   Basic Metabolic Panel Recent Labs    10/06/18 0410 10/07/18 0432  NA 137 137  K 4.0 4.6  CL 105 106  CO2 22 23  GLUCOSE 158* 182*  BUN 26* 25*  CREATININE 1.27* 1.29*  CALCIUM 8.1* 7.0*   Liver Function Tests No results for input(s): AST, ALT, ALKPHOS, BILITOT, PROT, ALBUMIN in the last 72 hours. No results for input(s): LIPASE, AMYLASE in the last 72 hours. Cardiac Enzymes No results for input(s): CKTOTAL, CKMB, CKMBINDEX, TROPONINI in the last 72 hours.  BNP: BNP (last 3 results) No results for input(s): BNP in the last 8760 hours.  ProBNP (last 3 results) No results for input(s): PROBNP in the last 8760 hours.   D-Dimer No results for input(s): DDIMER in the last 72 hours. Hemoglobin A1C No results for input(s): HGBA1C in the last 72 hours. Fasting Lipid Panel No results for input(s): CHOL, HDL, LDLCALC, TRIG, CHOLHDL, LDLDIRECT in the last 72 hours. Thyroid Function Tests No results for input(s): TSH, T4TOTAL, T3FREE, THYROIDAB in the last 72 hours.  Invalid input(s): FREET3  Other results:   Imaging    No results found.  Medications:     Scheduled Medications: . amiodarone  200 mg Oral BID  . apixaban  5 mg Oral BID  . aspirin EC  81 mg Oral Daily   Or  . aspirin  81 mg Per Tube Daily  . bisacodyl  10 mg Oral Daily   Or  . bisacodyl  10 mg Rectal Daily  . buPROPion  300 mg Oral Daily  . Chlorhexidine Gluconate Cloth  6 each Topical Daily  . furosemide  40 mg Oral Daily  . glimepiride  4 mg Oral Q breakfast  . insulin aspart  0-15 Units Subcutaneous TID WC  . loratadine  10 mg Oral Daily  . mouth rinse  15 mL Mouth Rinse BID  . metFORMIN  500 mg Oral BID WC  . pantoprazole  40 mg Oral Daily  . potassium chloride  20 mEq Oral Daily  . rosuvastatin  20 mg Oral Daily  .  sildenafil  20 mg Oral TID  . sodium chloride flush  10-40 mL Intracatheter Q12H  . sodium chloride flush  3 mL Intravenous Q12H  . sodium chloride flush  3 mL Intravenous Q12H  . spironolactone  12.5 mg Oral Daily    Infusions: . sodium chloride Stopped (10/07/18 0835)    PRN Medications: metoprolol tartrate, morphine injection, ondansetron (ZOFRAN) IV, oxyCODONE, sodium chloride flush, sodium chloride flush, sodium chloride flush, traMADol    Patient Profile   Doris [facility] is a 77 y.o. female with Chronic diastolic CHF, HTN, CAD, PAF, h/o OSA on CPAP, DM 2, HLD, moderate AS, and obesity.   Asked to see by [facility] for optimization prior to CABG.  Assessment/Plan   1. Severe 3v CAD - Severe with 75% RCA, 100% Cx, 80% LM and LAD 50% stenosed - S/p CABG x 3 with bioprosthetic AVR and LAA clip on 12/20 - Now POD#10 Doing well. - CR on board  2. Moderate AS - s/p bioprosthetic AVR - Echo 12/26: 50-55%, trivial AI with mean gradient of 11 mmHg, trivial MR, RV mod reduced, mild to mod TR   3. Acute on chronic diastolic CHF - Echo 16/1/09 LVEF 55-60%, Grade 2 DD, Mild/Mod AS, Mod MR, mild LAE, RV mild dilated, Mild TR, PA peak pressure 79 mm Hg.  - Coox 60%. Milrinone stopped 12/28 - Volume status stable. - Continue lasix 40 daily.  - Continue spiro 12.5 mg daily.  - Continue TED hose  4. 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 Suspect unlikely to benefit from pulmonary vasodilators.  - PA systolics were 60-45WUJW before swan pulled. Echo with evidence of RV overload. - Continue sildenafil 20 tid  4. PAF - Continue Eliquis 5 mg BID (restarted 12/26 pm) - S/p DCCV 12/30. Now in NSR. Now on PO amio.   6. OSA on CPAP - Continue CPAP. No change.    7. DM 2 - Per TCTS. No change.   8. AKI - Creatinine 0.9 > 1.7 > 1.9 > 1.89 > 1.67 > 1.66  > 1.3 -> 1.25 -> 1.27 -> 1.29. Suspect ATN.   9. Hypokalemia - K 4.6  10. Disposition - Now considering SNF vs CIR vs HH per patient. PT to see again today.    Length of Stay: [address] NP  10/07/2018, 8:53 AM  Advanced Heart Failure Team Pager  [phone] (M-F; 7a - 4p)  Please contact [facility] for night-coverage after hours (4p -7a ) and weekends on [email or web address]  Patient seen with NP, agree with the above note.   She is back in NSR after DCCV this morning, feels better.    Euvolemic on exam.   Continue amiodarone 200 mg bid x 2 weeks then decrease to 200 daily.    Will add Coreg 3.125 mg bid given CAD.   She should be stable for discharge versus rehab placement soon.  [name] 10/07/2018 9:20 AM

## 2018-10-07 NOTE — Anesthesia Preprocedure Evaluation (Addendum)
Anesthesia Evaluation  Patient identified by MRN, date of birth, ID band Patient awake    Reviewed: Allergy & Precautions, H&P , NPO status , Patient's Chart, lab work & pertinent test results, reviewed documented beta blocker date and time   Airway Mallampati: II  TM Distance: >3 FB Neck ROM: Full    Dental no notable dental hx. (+) Teeth Intact, Dental Advisory Given   Pulmonary sleep apnea and Continuous Positive Airway Pressure Ventilation ,    Pulmonary exam normal breath sounds clear to auscultation       Cardiovascular hypertension, Pt. on medications and Pt. on home beta blockers + CAD  + dysrhythmias Atrial Fibrillation  Rhythm:Irregular Rate:Normal     Neuro/Psych Depression negative neurological ROS     GI/Hepatic negative GI ROS, Neg liver ROS,   Endo/Other  diabetes, Type 2, Oral Hypoglycemic AgentsMorbid obesity  Renal/GU negative Renal ROS  negative genitourinary   Musculoskeletal   Abdominal   Peds  Hematology negative hematology ROS (+) Blood dyscrasia, anemia ,   Anesthesia Other Findings   Reproductive/Obstetrics negative OB ROS                            Anesthesia Physical Anesthesia Plan  ASA: III  Anesthesia Plan: General   Post-op Pain Management:    Induction: Intravenous  PONV Risk Score and Plan: 3 and Treatment may vary due to age or medical condition  Airway Management Planned: Mask  Additional Equipment:   Intra-op Plan:   Post-operative Plan:   Informed Consent: I have reviewed the patients History and Physical, chart, labs and discussed the procedure including the risks, benefits and alternatives for the proposed anesthesia with the patient or authorized representative who has indicated his/her understanding and acceptance.   Dental advisory given  Plan Discussed with: CRNA  Anesthesia Plan Comments:         Anesthesia Quick  Evaluation

## 2018-10-07 NOTE — Progress Notes (Signed)
Inpatient Rehabilitation Admissions Coordinator  I met with patient and family at bedside. Patient reports she has ambulated the hallway after her cardioversion and was told that Home health is now recommended. We will sign off at this time.  Danne Baxter, RN, MSN Rehab Admissions Coordinator [phone] 10/07/2018 11:59 AM

## 2018-10-07 NOTE — Anesthesia Preprocedure Evaluation (Deleted)
Anesthesia Evaluation  Patient identified by MRN, date of birth, ID band Patient awake    Reviewed: Allergy & Precautions, NPO status , Patient's Chart, lab work & pertinent test results, reviewed documented beta blocker date and time   Airway Mallampati: II  TM Distance: >3 FB Neck ROM: Full    Dental  (+) Teeth Intact, Dental Advisory Given   Pulmonary sleep apnea ,    Pulmonary exam normal breath sounds clear to auscultation       Cardiovascular hypertension, Pt. on medications and Pt. on home beta blockers + CAD and + CABG  + dysrhythmias Atrial Fibrillation + Valvular Problems/Murmurs (s/p AVR)  Rate:Abnormal  S/p CABG x 3 with bioprosthetic AVR and LAA clip on 12/20  Echo 12/26: 50-55%, trivial AI with mean gradient of 11 mmHg, trivial MR, RV mod reduced, mild to mod TR   Neuro/Psych PSYCHIATRIC DISORDERS Depression negative neurological ROS     GI/Hepatic Neg liver ROS, GERD  ,  Endo/Other  diabetes, Type 2, Oral Hypoglycemic AgentsObesity   Renal/GU Renal InsufficiencyRenal disease     Musculoskeletal negative musculoskeletal ROS (+)   Abdominal   Peds  Hematology  (+) Blood dyscrasia, anemia ,   Anesthesia Other Findings Day of surgery medications reviewed with the patient.  Reproductive/Obstetrics                             Anesthesia Physical Anesthesia Plan  ASA: III  Anesthesia Plan: General   Post-op Pain Management:    Induction: Intravenous  PONV Risk Score and Plan: 3 and Treatment may vary due to age or medical condition  Airway Management Planned: Mask  Additional Equipment:   Intra-op Plan:   Post-operative Plan:   Informed Consent: I have reviewed the patients History and Physical, chart, labs and discussed the procedure including the risks, benefits and alternatives for the proposed anesthesia with the patient or authorized representative who has  indicated his/her understanding and acceptance.   Dental advisory given  Plan Discussed with: CRNA  Anesthesia Plan Comments:         Anesthesia Quick Evaluation

## 2018-10-07 NOTE — Op Note (Signed)
Procedure: Electrical Cardioversion Indications:  Atrial Fibrillation  Procedure Details:  Consent: Risks of procedure as well as the alternatives and risks of each were explained to the (patient/caregiver).  Consent for procedure obtained.  Time Out: Verified patient identification, verified procedure, site/side was marked, verified correct patient position, special equipment/implants available, medications/allergies/relevent history reviewed, required imaging and test results available.  Performed  Patient placed on cardiac monitor, pulse oximetry, supplemental oxygen as necessary.  Sedation given: Dr. Therisa Doyne, propofol 40 mg IV Pacer pads placed anterior and posterior chest.  Cardioverted 1 time(s).  Cardioversion with synchronized biphasic 120J shock.  Evaluation: Findings: Post procedure EKG shows: NSR Complications: None Patient did tolerate procedure well.  Time Spent Directly with the Patient:  30 minutes   Doris Jefferson 10/07/2018, 8:19 AM

## 2018-10-07 NOTE — Progress Notes (Addendum)
      [address]       [place] [address]             [phone]        10 Days Post-Op Procedure(s) (LRB): CORONARY ARTERY BYPASS GRAFTING (CABG) x three, using left internal mammary artery and right leg greater saphenous vein harvested endoscopically (N/A) AORTIC VALVE REPLACEMENT (AVR) USING MAGNA EASE SIZE 21 MM (N/A) CLIPPING OF ATRIAL APPENDAGE USING ATRICURE FLEXV SIZE 35MM (N/A) TRANSESOPHAGEAL ECHOCARDIOGRAM (TEE) (N/A)  Subjective: Patient waiting to be taken for DCCV. She states her breathing is pretty good and has no specific complaints.  Objective: Vital signs in last 24 hours: Temp:  [98 F (36.7 C)-98.5 F (36.9 C)] 98.5 F (36.9 C) (12/30 0442) Pulse Rate:  [89-116] 103 (12/30 0442) Cardiac Rhythm: Atrial fibrillation (12/30 0200) Resp:  [20-27] 23 (12/30 0442) BP: (109-137)/(46-64) 109/60 (12/30 0442) SpO2:  [96 %-100 %] 96 % (12/30 0442) Weight:  [96.5 kg] 96.5 kg (12/30 0442)  Pre op weight 94 kg Current Weight  10/07/18 96.5 kg       Intake/Output from previous day: 12/29 0701 - 12/30 0700 In: 826.7 [P.O.:720; I.V.:106.7] Out: -    Physical Exam:  Cardiovascular: IRRR IRRR Pulmonary: Clear to auscultation bilaterally Abdomen: Soft, obese, non tender, bowel sounds present. Extremities: Trace bilateral lower extremity edema;ted hose in place Wounds: Clean and dry.  Lower sternal wound with slight erythema around skin edges.  Lab Results: CBC: Recent Labs    10/06/18 0410 10/07/18 0432  WBC 12.1* 11.7*  HGB 10.0* 9.8*  HCT 31.6* 30.9*  PLT 258 243   BMET:  Recent Labs    10/06/18 0410 10/07/18 0432  NA 137 137  K 4.0 4.6  CL 105 106  CO2 22 23  GLUCOSE 158* 182*  BUN 26* 25*  CREATININE 1.27* 1.29*  CALCIUM 8.1* 7.0*    PT/INR:  Lab Results  Component Value Date   INR 1.47 09/27/2018   INR 1.21 09/26/2018   INR 1.37 12/10/2017   ABG:  INR: Will add last result for INR, ABG once components are  confirmed Will add last 4 CBG results once components are confirmed  Assessment/Plan: 1. CV-A fib with mostly CVR. On Amiodarone 200 mg bid, and Apixaban 5 mg bid. Co ox this am slightly increased to 60.3 (Milrinone stopped Saturday). DCCV to be done today. 2. Pulmonary-She has moderate pulmonary hypertension and is on Sildenafil 20 mg tid. On room air.  Encourage incentive spirometer 3. Acute on chronic diastolic CHF-on Lasix 40 mg daily and Spironolactone 12.5 mg daily 4. ABL anemia- H and H stable at 9.8 and 30.9 this am 5. DM-CBGs 164/172/184. On Metformin 500 mg bid and Glimepiride 4 mg daily.[name] HGA1C 5.8 6. AKI-Likely etiology is ATN.Creatinine this am 1.29 7. Deconditioning-continue PT   Donielle M ZimmermanPA-C 10/07/2018,7:11 AM [phone]  Patient had successful DC cardioversion now in sinus rhythm and feels better Continue with diuresis, diabetic control, and therapy for deconditioning patient examined and medical record reviewed,agree with above note. Tharon Aquas Trigt III 10/07/2018

## 2018-10-07 NOTE — Anesthesia Postprocedure Evaluation (Signed)
Anesthesia Post Note  Patient: Doris Jefferson  Procedure(s) Performed: CARDIOVERSION (N/A )     Patient location during evaluation: Endoscopy Anesthesia Type: General Level of consciousness: awake and alert Pain management: pain level controlled Vital Signs Assessment: post-procedure vital signs reviewed and stable Respiratory status: spontaneous breathing, nonlabored ventilation and respiratory function stable Cardiovascular status: blood pressure returned to baseline and stable Postop Assessment: no apparent nausea or vomiting Anesthetic complications: no    Last Vitals:  Vitals:   10/07/18 0835 10/07/18 0840  BP: (!) 106/41 (!) 118/44  Pulse: 70 70  Resp: (!) 25 20  Temp:    SpO2: 98% 98%    Last Pain:  Vitals:   10/07/18 0824  TempSrc: Oral  PainSc: 0-No pain                 Jancie Kercher,W. EDMOND

## 2018-10-08 ENCOUNTER — Telehealth (HOSPITAL_COMMUNITY): Payer: Self-pay | Admitting: Cardiology

## 2018-10-08 ENCOUNTER — Encounter (HOSPITAL_COMMUNITY): Payer: Self-pay | Admitting: Cardiovascular Disease

## 2018-10-08 LAB — GLUCOSE, CAPILLARY
COMMENT 1: 0
GLUCOSE-CAPILLARY: 145 mg/dL — AB (ref 70–99)
Glucose-Capillary: 169 mg/dL — ABNORMAL HIGH (ref 70–99)
Glucose-Capillary: 199 mg/dL — ABNORMAL HIGH (ref 70–99)
Glucose-Capillary: 180 mg/dL — ABNORMAL HIGH (ref 70–99)

## 2018-10-08 LAB — C DIFFICILE QUICK SCREEN W PCR REFLEX
C Diff antigen: NEGATIVE
C Diff interpretation: NOT DETECTED
C Diff toxin: NEGATIVE

## 2018-10-08 LAB — CBC
HCT: 31.1 % — ABNORMAL LOW (ref 36.0–46.0)
Hemoglobin: 9.3 g/dL — ABNORMAL LOW (ref 12.0–15.0)
MCH: 27.9 pg (ref 26.0–34.0)
MCHC: 29.9 g/dL — ABNORMAL LOW (ref 30.0–36.0)
MCV: 93.4 fL (ref 80.0–100.0)
Platelets: 229 10*3/uL (ref 150–400)
RBC: 3.33 MIL/uL — ABNORMAL LOW (ref 3.87–5.11)
RDW: 15.5 % (ref 11.5–15.5)
WBC: 12.5 10*3/uL — AB (ref 4.0–10.5)
nRBC: 0 % (ref 0.0–0.2)

## 2018-10-08 LAB — BASIC METABOLIC PANEL
Anion gap: 10 (ref 5–15)
BUN: 25 mg/dL — AB (ref 8–23)
CO2: 21 mmol/L — ABNORMAL LOW (ref 22–32)
Calcium: 8.2 mg/dL — ABNORMAL LOW (ref 8.9–10.3)
Chloride: 107 mmol/L (ref 98–111)
Creatinine, Ser: 1.43 mg/dL — ABNORMAL HIGH (ref 0.44–1.00)
GFR calc Af Amer: 41 mL/min — ABNORMAL LOW (ref >60)
GFR calc non Af Amer: 35 mL/min — ABNORMAL LOW (ref >60)
Glucose, Bld: 169 mg/dL — ABNORMAL HIGH (ref 70–99)
Potassium: 4.1 mmol/L (ref 3.5–5.1)
Sodium: 138 mmol/L (ref 135–145)

## 2018-10-08 LAB — COOXEMETRY PANEL
CARBOXYHEMOGLOBIN: 1.4 % (ref 0.5–1.5)
Methemoglobin: 1.7 % — ABNORMAL HIGH (ref 0.0–1.5)
O2 SAT: 62.3 %
Total hemoglobin: 9.7 g/dL — ABNORMAL LOW (ref 12.0–16.0)

## 2018-10-08 MED ORDER — OXYCODONE HCL 5 MG PO TABS: 5.0000 mg | ORAL_TABLET | ORAL | 0 refills | Status: DC | PRN

## 2018-10-08 MED ORDER — APIXABAN 5 MG PO TABS: 5.0000 mg | ORAL_TABLET | Freq: Two times a day (BID) | ORAL | 10 refills | Status: DC

## 2018-10-08 MED ORDER — SPIRONOLACTONE 25 MG PO TABS: 12.5000 mg | ORAL_TABLET | Freq: Every day | ORAL | 3 refills | Status: DC

## 2018-10-08 MED ORDER — METFORMIN HCL 500 MG PO TABS: 500.0000 mg | ORAL_TABLET | Freq: Two times a day (BID) | ORAL | 3 refills | Status: DC

## 2018-10-08 MED ORDER — ASPIRIN 81 MG PO TBEC: 81.0000 mg | DELAYED_RELEASE_TABLET | Freq: Every day | ORAL | Status: DC

## 2018-10-08 MED ORDER — AMIODARONE HCL 200 MG PO TABS: 200.0000 mg | ORAL_TABLET | Freq: Two times a day (BID) | ORAL | 3 refills | Status: DC

## 2018-10-08 MED ORDER — SILDENAFIL CITRATE 20 MG PO TABS: 20.0000 mg | ORAL_TABLET | Freq: Three times a day (TID) | ORAL | 3 refills | Status: DC

## 2018-10-08 MED ORDER — CARVEDILOL 3.125 MG PO TABS: 3.1250 mg | ORAL_TABLET | Freq: Two times a day (BID) | ORAL | 3 refills | Status: DC

## 2018-10-08 MED ORDER — FUROSEMIDE 20 MG PO TABS
20.0000 mg | ORAL_TABLET | Freq: Every day | ORAL | Status: DC
  Administered 2018-10-08 – 2018-10-09 (×2): 20 mg via ORAL
  Filled 2018-10-08 (×2): qty 1

## 2018-10-08 MED ORDER — LOPERAMIDE HCL 2 MG PO CAPS
2.0000 mg | ORAL_CAPSULE | ORAL | Status: DC | PRN
  Administered 2018-10-08 – 2018-10-09 (×2): 2 mg via ORAL
  Filled 2018-10-08 (×2): qty 1

## 2018-10-08 NOTE — Progress Notes (Addendum)
Advanced Heart Failure Rounding Note  PCP-Cardiologist: Sinclair Grooms, MD   Subjective:    S/p CABG x 3 with bioprosthetic AVR and LAA clip on 12/20  Milrinone stopped 12/28. Coox 62%.  S/p successful DCCV 12/30. Maintaining NSR.   On PO lasix. No UOP charted. Weight up 5 lbs. Creatinine 1.29 > 1.43  Denies CP or SOB. Hopes to go home today. She is okay with $47 copay for sildenafil. Requires prior auth.  Echo 12/26: 50-55%, trivial AI with mean gradient of 11 mmHg, trivial MR, RV mod reduced, mild to mod TR  Objective:   Weight Range: 96.8 kg Body mass index is 37.8 kg/m.   Vital Signs:   Temp:  [97.6 F (36.4 C)-98.2 F (36.8 C)] 97.8 F (36.6 C) (12/31 0355) Pulse Rate:  [67-72] 67 (12/31 0824) Resp:  [18-23] 23 (12/30 2030) BP: (122-141)/(42-64) 122/44 (12/31 0824) SpO2:  [97 %-99 %] 98 % (12/31 0355) Weight:  [96.8 kg] 96.8 kg (12/31 0358) Last BM Date: 10/07/18  Weight change: Filed Weights   10/07/18 0442 10/07/18 0746 10/08/18 0358  Weight: 96.5 kg 94.3 kg 96.8 kg    Intake/Output:   Intake/Output Summary (Last 24 hours) at 10/08/2018 0845 Last data filed at 10/07/2018 1300 Gross per 24 hour  Intake 250 ml  Output -  Net 250 ml      Physical Exam    General: Elderly. No resp difficulty. Sitting in chair.  HEENT: Normal Neck: Supple. JVP 7-8. Carotids 2+ bilat; no bruits. No thyromegaly or nodule noted. Cor: PMI nondisplaced. RRR, 2/6 TR. Midsternal incision.  Lungs: CTAB, normal effort. Abdomen: Soft, non-tender, non-distended, no HSM. No bruits or masses. +BS  Extremities: No cyanosis, clubbing, or rash. R and LLE no edema. BLE TED hose.  Neuro: Alert & orientedx3, cranial nerves grossly intact. moves all 4 extremities w/o difficulty. Affect pleasant  Telemetry   NSR 60-70s. Personally reviewed.   EKG    No new tracings.   Labs    CBC Recent Labs    10/07/18 0432 10/08/18 0400  WBC 11.7* 12.5*  HGB 9.8* 9.3*  HCT  30.9* 31.1*  MCV 92.8 93.4  PLT 243 431   Basic Metabolic Panel Recent Labs    10/07/18 0432 10/08/18 0400  NA 137 138  K 4.6 4.1  CL 106 107  CO2 23 21*  GLUCOSE 182* 169*  BUN 25* 25*  CREATININE 1.29* 1.43*  CALCIUM 7.0* 8.2*   Liver Function Tests No results for input(s): AST, ALT, ALKPHOS, BILITOT, PROT, ALBUMIN in the last 72 hours. No results for input(s): LIPASE, AMYLASE in the last 72 hours. Cardiac Enzymes No results for input(s): CKTOTAL, CKMB, CKMBINDEX, TROPONINI in the last 72 hours.  BNP: BNP (last 3 results) No results for input(s): BNP in the last 8760 hours.  ProBNP (last 3 results) No results for input(s): PROBNP in the last 8760 hours.   D-Dimer No results for input(s): DDIMER in the last 72 hours. Hemoglobin A1C No results for input(s): HGBA1C in the last 72 hours. Fasting Lipid Panel No results for input(s): CHOL, HDL, LDLCALC, TRIG, CHOLHDL, LDLDIRECT in the last 72 hours. Thyroid Function Tests No results for input(s): TSH, T4TOTAL, T3FREE, THYROIDAB in the last 72 hours.  Invalid input(s): FREET3  Other results:   Imaging    No results found.   Medications:     Scheduled Medications: . amiodarone  200 mg Oral BID  . apixaban  5 mg Oral BID  .  aspirin EC  81 mg Oral Daily   Or  . aspirin  81 mg Per Tube Daily  . bisacodyl  10 mg Oral Daily   Or  . bisacodyl  10 mg Rectal Daily  . buPROPion  300 mg Oral Daily  . carvedilol  3.125 mg Oral BID WC  . Chlorhexidine Gluconate Cloth  6 each Topical Daily  . furosemide  40 mg Oral Daily  . glimepiride  4 mg Oral Q breakfast  . insulin aspart  0-15 Units Subcutaneous TID WC  . loratadine  10 mg Oral Daily  . mouth rinse  15 mL Mouth Rinse BID  . metFORMIN  500 mg Oral BID WC  . pantoprazole  40 mg Oral Daily  . potassium chloride  20 mEq Oral Daily  . rosuvastatin  20 mg Oral Daily  . sildenafil  20 mg Oral TID  . sodium chloride flush  10-40 mL Intracatheter Q12H  . sodium  chloride flush  3 mL Intravenous Q12H  . sodium chloride flush  3 mL Intravenous Q12H  . spironolactone  12.5 mg Oral Daily    Infusions: . sodium chloride Stopped (10/07/18 0835)    PRN Medications: metoprolol tartrate, morphine injection, ondansetron (ZOFRAN) IV, oxyCODONE, sodium chloride flush, sodium chloride flush, sodium chloride flush, traMADol    Patient Profile   Doris Jefferson is a 77 y.o. female with Chronic diastolic CHF, HTN, CAD, PAF, h/o OSA on CPAP, DM 2, HLD, moderate AS, and obesity.   Asked to see by [facility] for optimization prior to CABG.  Assessment/Plan   1. Severe 3v CAD - Severe with 75% RCA, 100% Cx, 80% LM and LAD 50% stenosed - S/p CABG x 3 with bioprosthetic AVR and LAA clip on 12/20 - Now POD#11 Doing well. - CR on board. Planning for DC with [facility] PT today.  - Continue coreg 3.125 mg BID. Continue Crestor.   2. Moderate AS - s/p bioprosthetic AVR - Echo 12/26: 50-55%, trivial AI with mean gradient of 11 mmHg, trivial MR, RV mod reduced, mild to mod TR. No change.    3. Acute on chronic diastolic CHF - Echo 16/1/09 LVEF 55-60%, Grade 2 DD, Mild/Mod AS, Mod MR, mild LAE, RV mild dilated, Mild TR, PA peak pressure 79 mm Hg.  - Coox 62%. Milrinone stopped 12/28 - Volume status stable on exam.  - Creatinine 1.29 > 1.43 on lasix 40 mg daily. Decrease lasix back to 20 mg daily.  - Continue spiro 12.5 mg daily.  - Continue TED hose  4. 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 Suspect unlikely to benefit from pulmonary vasodilators.  - PA systolics were 60-45WUJW before swan pulled. Echo with evidence of RV overload. - Continue sildenafil 20 tid. CM checked price yesterday and it is $47/month and requires prior auth. With good RX, cost is $24/month.   4. PAF - Continue Eliquis 5 mg BID (restarted 12/26 pm) - S/p DCCV  12/30. Remains in NSR. Continue amio 200 mg BID  6. OSA on CPAP - Continue CPAP. No change.    7. DM 2 - Per TCTS. No change.   8. AKI - Creatinine 0.9 > 1.7 > 1.9 > 1.89 > 1.67 > 1.66 > 1.3 -> 1.25 -> 1.27 -> 1.29 -> 1.43. Suspect ATN.   9. Hypokalemia - K  4.1  10. Disposition - Planning for home with [facility] PT today.   Will need to sort out prior auth for sildenafil prior to DC.    Heart failure team will sign off as of 10/08/18  HF Medication Recommendations for Home: Amio 200 mg BID x 2 weeks then decrease to 200 mg daily.  Eliquis 5 mg BID Coreg 3.125 mg BID Lasix 20 mg daily Sildenafil 20 mg TID (prior auth completed by HF clinic CMA) Spiro 12.5 mg daily  Other recommendations (Labs,testing, etc): BMET, EKG at follow up  Follow up as an outpatient: Follow up requested. Will add to AVS.   Length of Stay: [place], NP  10/08/2018, 8:45 AM  Advanced Heart Failure Team Pager [phone] (M-F; [place])  Please contact [facility] for night-coverage after hours (4p -7a ) and weekends on [email or web address]  Patient seen with NP, agree with the above note.    I think that she is ready to go home today.  Will decrease Lasix to 20 mg daily.   We will arrange followup.   [name] 10/08/2018 9:39 AM

## 2018-10-08 NOTE — Progress Notes (Signed)
Prior authorization for sildenafil complete.

## 2018-10-08 NOTE — Progress Notes (Signed)
Called by nursing regarding patient having diarrhea.  Per nursing this has been happening over the past several days after administration of her morning medications.  She states it is a large amount each time and is non-bloody.  She states the patient is feeling poorly and she is hesitant to discharge her home.   Patient has not had any laxatives or stool softeners is several days.  Only new medications for her are Spironolactone and Coreg.  She has resumed home diabetic medications   A/P:  1. Check C. Diff 2. Imodium prn 3. Hold D/C for now, if patients symptoms improve and she is feeling better can possibly d/c later today vs tomorrow

## 2018-10-08 NOTE — Progress Notes (Addendum)
      [address]       [place] [address]             [phone]      1 Day Post-Op Procedure(s) (LRB): CARDIOVERSION (N/A)   Subjective:  No new complaints.  Feels great.  + ambulation, wants to go home  Objective: Vital signs in last 24 hours: Temp:  [97.6 F (36.4 C)-98.2 F (36.8 C)] 97.8 F (36.6 C) (12/31 0355) Pulse Rate:  [67-72] 67 (12/31 0355) Cardiac Rhythm: Normal sinus rhythm;Bundle branch block (12/31 0740) Resp:  [18-25] 23 (12/30 2030) BP: (106-141)/(35-64) 132/54 (12/31 0010) SpO2:  [96 %-99 %] 98 % (12/31 0355) Weight:  [96.8 kg] 96.8 kg (12/31 0358)  Intake/Output from previous day: 12/30 0701 - 12/31 0700 In: 450 [P.O.:240; I.V.:210] Out: 0   General appearance: alert, cooperative and no distress Heart: regular rate and rhythm Lungs: clear to auscultation bilaterally Abdomen: soft, non-tender; bowel sounds normal; no masses,  no organomegaly Extremities: edema trace Wound: clean and dry  Lab Results: Recent Labs    10/07/18 0432 10/08/18 0400  WBC 11.7* 12.5*  HGB 9.8* 9.3*  HCT 30.9* 31.1*  PLT 243 229   BMET:  Recent Labs    10/07/18 0432 10/08/18 0400  NA 137 138  K 4.6 4.1  CL 106 107  CO2 23 21*  GLUCOSE 182* 169*  BUN 25* 25*  CREATININE 1.29* 1.43*  CALCIUM 7.0* 8.2*    PT/INR: No results for input(s): LABPROT, INR in the last 72 hours. ABG    Component Value Date/Time   PHART 7.345 (L) 09/28/2018 1109   HCO3 21.5 09/28/2018 1109   TCO2 23 09/28/2018 1629   ACIDBASEDEF 4.0 (H) 09/28/2018 1109   O2SAT 62.3 10/08/2018 0410   CBG (last 3)  Recent Labs    10/07/18 1639 10/07/18 2128 10/08/18 0625  GLUCAP 118* 158* 169*    Assessment/Plan: S/P Procedure(s) (LRB): CARDIOVERSION (N/A)  1. CV- S/P Cardioversion yesterday, maintaining NSR- continue Coreg, Amiodarone 200 mg BID, Eliquis 2. Pulm- no acute issues, continue IS 3. Renal- creatinine at 1.45, euvolemic on exam, on Lasix, Arlyce Harman, can  possibly change lasix to prn dosing, discussed with Caryl Pina NP with AHF, will await recommendations 4. DM- sugars controlled, will resume home insulin regimen 5. Deconditioning- patient ambulation improved, will now require HH/PT 6. Dispo- patient stable, will await AHF recs on diuretics, place H/H orders, discuss discharge with Dr. Cyndia Bent   LOS: 14 days    Ellwood Handler   Patient ambulating well on room air, feels stronger Maintaining sinus rhythm after cardioversion  yesterday Sternal incision with minimal eschar but clean and dry Chest x-ray clear Patient appears ready for discharge today   will need home health nurse and home health physical therapy and rolling walker Discharge instructions regarding activity, wound care, meds and diet reviewed with patient and family. 10/08/2018

## 2018-10-08 NOTE — Progress Notes (Signed)
CARDIAC REHAB PHASE I   PRE:  Rate/Rhythm: 73 SR  BP:  Sitting: 122/44      SaO2: 95 RA  MODE:  Ambulation: 200 ft 89 peak HR  POST:  Rate/Rhythm: 76 SR  BP:  Sitting: 149/46    SaO2: 94 RA   Pt ambulated 274ft in hallway assist of one with slow steady gait. Pt took several short standing rest breaks, c/o "tiredness". Pt denies pain or SOB. Education completed with pt and family. Pt instructed to shower and monitor incisions daily. Encouraged continued IS use, walks, and sternal precautions. Pt given in-the-tube sheet, heart healthy and diabetic diets. Reviewed restrictions and exercise guidelines. Pt requesting rollator for home use, CM made aware. Will refer to [facility] [place] Rufina Falco, RN BSN 10/08/2018 9:38 AM

## 2018-10-08 NOTE — Care Management Important Message (Signed)
Important Message  Patient Details  Name: ARGUSTA MCGANN MRN: [id number] Date of Birth: [birth year]   Medicare Important Message Given:  Yes    Eli Adami P Malessa Zartman 10/08/2018, 4:16 PM

## 2018-10-08 NOTE — Care Management Note (Addendum)
Case Management Note Marvetta Gibbons RN, BSN Transitions of Care Unit 4E- RN Case Manager [phone]  Patient Details  Name: Doris Jefferson MRN: [id number] Date of Birth: 1941-07-21  Subjective/Objective:  Pt admitted with Abnormal echo, s/p CABG and AVR on 12/20                  Action/Plan: PTA pt lived at home with spouse- plan to return home, orders for [facility] and DME-rollator have been placed- prior Josem Kaufmann has been done per PA for sildenafil. CM spoke with pt at family at bedside- call made to pharmacy to check hours and cost for sildendafil- per pharmacy [facility] they will be open until 9pm today closed tomorrow. Pt's cost for sildendafil will be $5.71 and per pt she will not need GoodRx coupon- however CM has given pt/family info on GoodRx app for future reference. Choice offered for [facility] agency with list provided per CMS website with ratings.- per pt she uses [facility] for her cpap and would like to use them for her DME and HH needs this time. Call made to [facility] with [facility] for rollator- which will be delivered to room prior to discharge- call also made to [facility] with [facility] for [facility] need- referral accepted.   Expected Discharge Date:  10/08/18               Expected Discharge Plan:  [place]  In-House Referral:     Discharge planning Services  CM Consult  Post Acute Care Choice:  Durable Medical Equipment, Home Health Choice offered to:  Patient  DME Arranged:  Walker rolling with seat DME Agency:  [place] Arranged:  RN, PT, OT [facility] Agency:  [place]  Status of Service:  Completed, signed off  If discussed at [place] of Stay Meetings, dates discussed:    Discharge Disposition: home/home health   Additional Comments:  Dawayne Patricia, RN 10/08/2018, 10:49 AM

## 2018-10-08 NOTE — Progress Notes (Signed)
Physical Therapy Treatment Patient Details Name: Doris Jefferson MRN: [id number] DOB: May 31, 1941 Today's Date: 10/08/2018    History of Present Illness Patient is a 77 y.o. female with Chronic diastolic CHF, HTN, CAD, PAF, h/o OSA on CPAP, DM 2, HLD, moderate AS, and obesity. S/p CABG x 3 with bioprosthetic AVR and LAA clip on 12/20    PT Comments    Patient progressing well with mobility. Ambulated in room with VSS. Educated patient on use of 4 wheeled rollator walker. Patient with good carry over of education. Current POC remains appropriate.   OF NTOE: patient with 2 trips to bathroom for diarrhea. Nsg aware  Follow Up Recommendations  Home health PT;Supervision for mobility/OOB     Equipment Recommendations  (rollator)    Recommendations for Other Services       Precautions / Restrictions Precautions Precautions: Fall Precaution Comments: sternal; wound vac in place Restrictions Weight Bearing Restrictions: Yes(sternal precautions)    Mobility  Bed Mobility               General bed mobility comments: OOB in chair  Transfers Overall transfer level: Needs assistance Equipment used: Rolling walker (2 wheeled) Transfers: Sit to/from Stand Sit to Stand: Supervision         General transfer comment: no physical assist required  Ambulation/Gait Ambulation/Gait assistance: Supervision Gait Distance (Feet): 90 Feet(in room ambulation x3) Assistive device: 4-wheeled walker Gait Pattern/deviations: Step-through pattern;Decreased stride length;Narrow base of support Gait velocity: decreased   General Gait Details: VCs for education of use with rollator   Stairs             Wheelchair Mobility    Modified Rankin (Stroke Patients Only)       Balance Overall balance assessment: Needs assistance Sitting-balance support: Feet supported Sitting balance-Leahy Scale: Good Sitting balance - Comments: hands on assist for sitting at EOB   Standing  balance support: During functional activity;Bilateral upper extremity supported Standing balance-Leahy Scale: Poor                              Cognition Arousal/Alertness: Awake/alert Behavior During Therapy: WFL for tasks assessed/performed Overall Cognitive Status: Within Functional Limits for tasks assessed                                        Exercises      General Comments        Pertinent Vitals/Pain Pain Assessment: No/denies pain    Home Living                      Prior Function            PT Goals (current goals can now be found in the care plan section) Acute Rehab PT Goals Patient Stated Goal: to get better PT Goal Formulation: With patient/family Time For Goal Achievement: 10/14/18 Potential to Achieve Goals: Good Progress towards PT goals: Progressing toward goals    Frequency    Min 3X/week      PT Plan Current plan remains appropriate    Co-evaluation              AM-PAC PT "6 Clicks" Mobility   Outcome Measure  Help needed turning from your back to your side while in a flat bed without using bedrails?: A Little Help needed moving from  lying on your back to sitting on the side of a flat bed without using bedrails?: A Little Help needed moving to and from a bed to a chair (including a wheelchair)?: A Little Help needed standing up from a chair using your arms (e.g., wheelchair or bedside chair)?: A Little Help needed to walk in hospital room?: A Little Help needed climbing 3-5 steps with a railing? : A Little 6 Click Score: 18    End of Session Equipment Utilized During Treatment: Gait belt Activity Tolerance: Patient tolerated treatment well Patient left: with call bell/phone within reach;with family/visitor present;in chair Nurse Communication: Mobility status PT Visit Diagnosis: Difficulty in walking, not elsewhere classified (R26.2);Muscle weakness (generalized) (M62.81)     Time:  4665-9935 PT Time Calculation (min) (ACUTE ONLY): 18 min  Charges:  $Gait Training: 8-22 mins                     Alben Deeds, PT DPT  Board Certified Neurologic Specialist [place] Pager [phone] Office [phone]    Duncan Dull 10/08/2018, 2:25 PM

## 2018-10-08 NOTE — Telephone Encounter (Signed)
Sildenafil PA approved via [facility] Valid 10/09/2019

## 2018-10-09 LAB — BASIC METABOLIC PANEL
Anion gap: 10 (ref 5–15)
BUN: 27 mg/dL — AB (ref 8–23)
CO2: 21 mmol/L — ABNORMAL LOW (ref 22–32)
Calcium: 8.6 mg/dL — ABNORMAL LOW (ref 8.9–10.3)
Chloride: 107 mmol/L (ref 98–111)
Creatinine, Ser: 1.56 mg/dL — ABNORMAL HIGH (ref 0.44–1.00)
GFR calc Af Amer: 37 mL/min — ABNORMAL LOW (ref >60)
GFR calc non Af Amer: 32 mL/min — ABNORMAL LOW (ref >60)
Glucose, Bld: 130 mg/dL — ABNORMAL HIGH (ref 70–99)
Potassium: 4 mmol/L (ref 3.5–5.1)
Sodium: 138 mmol/L (ref 135–145)

## 2018-10-09 LAB — COOXEMETRY PANEL
Carboxyhemoglobin: 1.1 % (ref 0.5–1.5)
Methemoglobin: 1.9 % — ABNORMAL HIGH (ref 0.0–1.5)
O2 Saturation: 53.3 %
TOTAL HEMOGLOBIN: 10.3 g/dL — AB (ref 12.0–16.0)

## 2018-10-09 LAB — GLUCOSE, CAPILLARY: Glucose-Capillary: 114 mg/dL — ABNORMAL HIGH (ref 70–99)

## 2018-10-09 MED ORDER — LOPERAMIDE HCL 2 MG PO CAPS: 2.0000 mg | ORAL_CAPSULE | ORAL | 0 refills | Status: AC | PRN

## 2018-10-09 MED ORDER — AMIODARONE HCL 200 MG PO TABS: 200.0000 mg | ORAL_TABLET | Freq: Two times a day (BID) | ORAL | 3 refills | Status: DC

## 2018-10-09 MED ORDER — CARVEDILOL 3.125 MG PO TABS: 3.1250 mg | ORAL_TABLET | Freq: Two times a day (BID) | ORAL | 3 refills | Status: DC

## 2018-10-09 MED ORDER — METFORMIN HCL 500 MG PO TABS: 500.0000 mg | ORAL_TABLET | Freq: Two times a day (BID) | ORAL | 3 refills | Status: DC

## 2018-10-09 MED ORDER — SILDENAFIL CITRATE 20 MG PO TABS: 20.0000 mg | ORAL_TABLET | Freq: Three times a day (TID) | ORAL | 3 refills | Status: AC

## 2018-10-09 NOTE — Progress Notes (Addendum)
      [address]       [place] [address]             [phone]      2 Days Post-Op Procedure(s) (LRB): CARDIOVERSION (N/A) Subjective: She does not want to take her coreg or spironolactone due to new onset diarrhea.   Objective: Vital signs in last 24 hours: Temp:  [97.5 F (36.4 C)-98.3 F (36.8 C)] 98.1 F (36.7 C) (01/01 0009) Pulse Rate:  [62-66] 65 (01/01 0009) Cardiac Rhythm: Normal sinus rhythm;Bundle branch block (01/01 0700) Resp:  [18-20] 20 (01/01 0009) BP: (112-150)/(35-81) 112/35 (01/01 0009) SpO2:  [96 %-99 %] 97 % (01/01 0009) Weight:  [96.4 kg] 96.4 kg (01/01 0339)     Intake/Output from previous day: 12/31 0701 - 01/01 0700 In: 662 [P.O.:662] Out: 350 [Urine:350] Intake/Output this shift: No intake/output data recorded.  General appearance: alert, cooperative and no distress Heart: regular rate and rhythm, S1, S2 normal, no murmur, click, rub or gallop Lungs: clear to auscultation bilaterally Abdomen: soft, non-tender; bowel sounds normal; no masses,  no organomegaly Extremities: 1-2+ pitting bilateral edema, weeping Wound: clean and dry  Lab Results: Recent Labs    10/07/18 0432 10/08/18 0400  WBC 11.7* 12.5*  HGB 9.8* 9.3*  HCT 30.9* 31.1*  PLT 243 229   BMET:  Recent Labs    10/08/18 0400 10/09/18 0342  NA 138 138  K 4.1 4.0  CL 107 107  CO2 21* 21*  GLUCOSE 169* 130*  BUN 25* 27*  CREATININE 1.43* 1.56*  CALCIUM 8.2* 8.6*    PT/INR: No results for input(s): LABPROT, INR in the last 72 hours. ABG    Component Value Date/Time   PHART 7.345 (L) 09/28/2018 1109   HCO3 21.5 09/28/2018 1109   TCO2 23 09/28/2018 1629   ACIDBASEDEF 4.0 (H) 09/28/2018 1109   O2SAT 53.3 10/09/2018 0350   CBG (last 3)  Recent Labs    10/08/18 1612 10/08/18 2123 10/09/18 0616  GLUCAP 145* 180* 114*    Assessment/Plan: S/P Procedure(s) (LRB): CARDIOVERSION (N/A)  1. CV- s/p cardioversion on 12/30. Maintaining NSR-on  Coreg, Amio 200mg  BID, and Eliquis. Will discontinue spironolactone-this is the likely culprit of her diarrhea.  2. Pulm- tolerating room air with good oxygen saturation 3. Renal-creatinine 1.56, electrolytes okay-increased slightly from yesterday 4. Endo-restarted home diabetic medication. Blood glucose 145/180/114 5. H and H- 9.3/31.1, stable 6. Diarrhea-not CDiff, continue imodium, holding spironolactone. Will see if heart failure has any other recommendations for medications  Plan: Will plan to discharge patient today if okay with HF. Follow-up is already arranged.       LOS: 15 days    Elgie Collard 10/09/2018

## 2018-10-14 ENCOUNTER — Telehealth: Payer: Self-pay

## 2018-10-14 NOTE — Telephone Encounter (Signed)
Patient's husband called 10/11/2018 in regards to a message left by patient's daughter stating patient has had nausea/ lightheadedness.  She is s/p CABG x3 09/27/2018 with Dr. [name]. Stated patient took Meclizine that was given in the past by her Cardiologist for vertigo.  Patient stated that with the medication her symptoms began to get better.  Patient's husband stated that her blood pressure was 143/83.  She was advised to change her positions slowly and is still taking her Lasix as prescribed for some lower extremity edema.  He acknowledged receipt.

## 2018-10-16 DIAGNOSIS — Z951 Presence of aortocoronary bypass graft: Secondary | ICD-10-CM | POA: Diagnosis not present

## 2018-10-16 DIAGNOSIS — Z48812 Encounter for surgical aftercare following surgery on the circulatory system: Secondary | ICD-10-CM | POA: Diagnosis not present

## 2018-10-16 DIAGNOSIS — I25810 Atherosclerosis of coronary artery bypass graft(s) without angina pectoris: Secondary | ICD-10-CM | POA: Insufficient documentation

## 2018-10-16 NOTE — Progress Notes (Signed)
Cardiology Office Note:    Date:  10/17/2018   ID:  [name], DOB 1941/05/07, MRN [id number]  PCP:  Aletha Halim., PA-C  Cardiologist:  Sinclair Grooms, MD   Referring MD: Josefa Half*   Chief Complaint  Patient presents with  . Cardiac Valve Problem  . Atrial Fibrillation  . Coronary Artery Disease    History of Present Illness:    Doris Jefferson is a 78 y.o. female with a hx of  Paroxysmal atrial fibrillation, OSA, hypertension, hyperlipidemia, aortic stenosis, type II DM, and recent AVR(bioprosthesis), LAA clip, and 3 vessel CABG (with LIMA to LAD, SVG to OM, and SVG to RCA) 09/27/18.Required cardioversion and IV amio loading prior to DC (12/30).  She is brought in today by her daughter.  She is status post recent open heart surgery for aortic valve and three-vessel coronary disease as outlined above.  Considering her prolonged hospital stay postop because of arrhythmia/atrial fib she looks quite good.  Skin color is still somewhat pale.  She is tolerating her current medication without difficulty.  Significant pain has resolved.  She denies orthopnea PND.  She does have exertional fatigue.  No blood in her urine or stool.  Appetite is improving.  Past Medical History:  Diagnosis Date  . Depression   . GERD (gastroesophageal reflux disease)   . Heart murmur   . HTN (hypertension)   . Hyperlipidemia   . Iron deficiency anemia   . Obesity (BMI 30-39.9) 03/04/2014  . OSA on CPAP    severe with AHI 31/hr  . PAF (paroxysmal atrial fibrillation) ([facility])   . Pneumonia    "twice; years ago" (09/24/2018)  . Skin cancer    "left neck; burned off"  . Type II diabetes mellitus ([facility])     Past Surgical History:  Procedure Laterality Date  . ABDOMINAL HYSTERECTOMY    . ANTERIOR CERVICAL DECOMP/DISCECTOMY FUSION     "cage around my neck; Dr. Erline Levine"  . AORTIC VALVE REPLACEMENT N/A 09/27/2018   Procedure: AORTIC VALVE REPLACEMENT (AVR) USING MAGNA  EASE SIZE 21 MM;  Surgeon: Grace Isaac, MD;  Location: [facility];  Service: Open Heart Surgery;  Laterality: N/A;  . BACK SURGERY    . CARDIAC CATHETERIZATION  09/24/2018  . CARDIOVERSION N/A 12/19/2013   Procedure: CARDIOVERSION;  Surgeon: Sinclair Grooms, MD;  Location: [facility] ENDOSCOPY;  Service: Cardiovascular;  Laterality: N/A;  . CARDIOVERSION N/A 08/16/2015   Procedure: CARDIOVERSION;  Surgeon: Larey Dresser, MD;  Location: [facility];  Service: Cardiovascular;  Laterality: N/A;  . CARDIOVERSION N/A 09/13/2015   Procedure: CARDIOVERSION;  Surgeon: Sueanne Margarita, MD;  Location: [facility] ENDOSCOPY;  Service: Cardiovascular;  Laterality: N/A;  . CARDIOVERSION N/A 10/07/2018   Procedure: CARDIOVERSION;  Surgeon: Sanda Klein, MD;  Location: [facility] ENDOSCOPY;  Service: Cardiovascular;  Laterality: N/A;  . CATARACT EXTRACTION W/ INTRAOCULAR LENS  IMPLANT, BILATERAL Bilateral   . CLIPPING OF ATRIAL APPENDAGE N/A 09/27/2018   Procedure: CLIPPING OF ATRIAL APPENDAGE USING ATRICURE FLEXV SIZE 35MM;  Surgeon: Grace Isaac, MD;  Location: [facility];  Service: Open Heart Surgery;  Laterality: N/A;  . COLONOSCOPY WITH PROPOFOL N/A 11/13/2017   Procedure: COLONOSCOPY WITH PROPOFOL;  Surgeon: Wilford Corner, MD;  Location: [facility] ENDOSCOPY;  Service: Endoscopy;  Laterality: N/A;  ANTIBIOTIC NEEDED BEFORE PROCEDURE  . CORONARY ARTERY BYPASS GRAFT N/A 09/27/2018   Procedure: CORONARY ARTERY BYPASS GRAFTING (CABG) x three, using left internal mammary artery and right leg  greater saphenous vein harvested endoscopically;  Surgeon: Grace Isaac, MD;  Location: [facility];  Service: Open Heart Surgery;  Laterality: N/A;  . ESOPHAGOGASTRODUODENOSCOPY (EGD) WITH PROPOFOL N/A 11/13/2017   Procedure: ESOPHAGOGASTRODUODENOSCOPY (EGD) WITH PROPOFOL;  Surgeon: Wilford Corner, MD;  Location: [facility] ENDOSCOPY;  Service: Endoscopy;  Laterality: N/A;  . EYE SURGERY    . RIGHT/LEFT HEART CATH AND CORONARY ANGIOGRAPHY N/A  09/24/2018   Procedure: RIGHT/LEFT HEART CATH AND CORONARY ANGIOGRAPHY;  Surgeon: Jettie Booze, MD;  Location: [facility] CV LAB;  Service: Cardiovascular;  Laterality: N/A;  . TEE WITHOUT CARDIOVERSION N/A 09/27/2018   Procedure: TRANSESOPHAGEAL ECHOCARDIOGRAM (TEE);  Surgeon: Grace Isaac, MD;  Location: [facility];  Service: Open Heart Surgery;  Laterality: N/A;  . TUBAL LIGATION      Current Medications: Current Meds  Medication Sig  . acetaminophen (TYLENOL) 325 MG tablet Take 650 mg by mouth every 6 (six) hours as needed for moderate pain or headache.  [name] amiodarone (PACERONE) 200 MG tablet Take 1 tablet (200 mg total) by mouth 2 (two) times daily. For 2 weeks, then decrease to 200 mg daily  . apixaban (ELIQUIS) 5 MG TABS tablet Take 1 tablet (5 mg total) by mouth 2 (two) times daily.  [name] aspirin EC 81 MG EC tablet Take 1 tablet (81 mg total) by mouth daily.  [name] buPROPion (WELLBUTRIN XL) 300 MG 24 hr tablet Take 300 mg by mouth daily.   . carvedilol (COREG) 3.125 MG tablet Take 1 tablet (3.125 mg total) by mouth 2 (two) times daily with a meal.  . doxylamine, Sleep, (UNISOM) 25 MG tablet Take 25 mg by mouth at bedtime as needed for sleep.  . fexofenadine (ALLEGRA) 60 MG tablet Take 60 mg by mouth daily as needed for allergies.   . furosemide (LASIX) 20 MG tablet TAKE 1 TABLET BY MOUTH DAILY (Patient taking differently: Take 20 mg by mouth daily. )  . glimepiride (AMARYL) 4 MG tablet Take 4 mg by mouth daily with breakfast.  . liraglutide (VICTOZA) 18 MG/3ML SOPN Inject 1.8 mg into the skin daily.   [name] loperamide (IMODIUM) 2 MG capsule Take 1 capsule (2 mg total) by mouth as needed for diarrhea or loose stools.  [name] MAGNESIUM-OXIDE 400 (241.3 Mg) MG tablet TAKE 1 TABLET BY MOUTH TWICE DAILY (Patient taking differently: Take 400 mg by mouth 2 (two) times daily. )  . metFORMIN (GLUCOPHAGE) 500 MG tablet Take 1 tablet (500 mg total) by mouth 2 (two) times daily with a meal.  . oxyCODONE  (OXY IR/ROXICODONE) 5 MG immediate release tablet Take 1-2 tablets (5-10 mg total) by mouth every 4 (four) hours as needed for severe pain.  . rosuvastatin (CRESTOR) 20 MG tablet Take 20 mg by mouth daily.  . sildenafil (REVATIO) 20 MG tablet Take 1 tablet (20 mg total) by mouth 3 (three) times daily.     Allergies:   Minocin [minocycline hcl]; Benazepril; Hydrochlorothiazide; and Tape   Social History   Socioeconomic History  . Marital status: Married    Spouse name: Not on file  . Number of children: Not on file  . Years of education: Not on file  . Highest education level: Not on file  Occupational History  . Not on file  Social Needs  . Financial resource strain: Not on file  . Food insecurity:    Worry: Not on file    Inability: Not on file  . Transportation needs:    Medical: Not on file  Non-medical: Not on file  Tobacco Use  . Smoking status: Never Smoker  . Smokeless tobacco: Never Used  Substance and Sexual Activity  . Alcohol use: Never    Frequency: Never  . Drug use: Never  . Sexual activity: Not Currently  Lifestyle  . Physical activity:    Days per week: Not on file    Minutes per session: Not on file  . Stress: Not on file  Relationships  . Social connections:    Talks on phone: Not on file    Gets together: Not on file    Attends religious service: Not on file    Active member of club or organization: Not on file    Attends meetings of clubs or organizations: Not on file    Relationship status: Not on file  Other Topics Concern  . Not on file  Social History Narrative  . Not on file     Family History: The patient's family history includes Heart disease in her father; Hypertension in her brother and father; Melanoma in her mother.  ROS:   Please see the history of present illness.    Lower extremity swelling, unexplained weight gain, hearing loss, snoring.  All other systems reviewed and are negative.  EKGs/Labs/Other Studies Reviewed:     The following studies were reviewed today: No new data.  Echo and cardiac catheter done prior to definitive surgical correction of moderate aortic stenosis and three-vessel coronary disease.  EKG:  EKG is not performed on today's visit.  The most recently performed EKG from October 07, 2018 demonstrates diffuse T wave flattening/inversion with poor R wave progression.  Recent Labs: 05/08/2018: TSH 3.000 09/28/2018: Magnesium 2.6 09/30/2018: ALT 53 10/08/2018: Hemoglobin 9.3; Platelets 229 10/09/2018: BUN 27; Creatinine, Ser 1.56; Potassium 4.0; Sodium 138  Recent Lipid Panel    Component Value Date/Time   CHOL 145 05/08/2018 0820   TRIG 152 (H) 05/08/2018 0820   HDL 43 05/08/2018 0820   CHOLHDL 3.4 05/08/2018 0820   LDLCALC 72 05/08/2018 0820    Physical Exam:    VS:  BP (!) 152/62   Pulse 73   Ht 5\' 3"  (1.6 m)   Wt 214 lb 9.6 oz (97.3 kg)   LMP  (LMP Unknown)   SpO2 95%   BMI 38.01 kg/m     Wt Readings from Last 3 Encounters:  10/17/18 214 lb 9.6 oz (97.3 kg)  10/09/18 212 lb 8.4 oz (96.4 kg)  09/19/18 209 lb (94.8 kg)     GEN: Uncle obesity.. No acute distress HEENT: Normal NECK: No JVD. LYMPHATICS: No lymphadenopathy CARDIAC: RRR.  1/6 systolic and no diastolic murmur.  S4 gallop.  2+ bilateral ankle edema VASCULAR: Pulses 2+ bilateral radial, Bruits absent in carotid region RESPIRATORY:  Clear to auscultation without rales, wheezing or rhonchi  ABDOMEN: Soft, non-tender, non-distended, No pulsatile mass, MUSCULOSKELETAL: No deformity  SKIN: Warm and dry NEUROLOGIC:  Alert and oriented x 3 PSYCHIATRIC:  Normal affect   ASSESSMENT:    1. Persistent atrial fibrillation   2. Essential hypertension   3. OSA (obstructive sleep apnea)   4. Anticoagulation goal of INR 2 to 3   5. Status post aortic valve replacement with tissue valve   6. Coronary artery disease of bypass graft of native heart with stable angina pectoris ([place])   7. Diabetes mellitus type 2 in  obese [facility])    PLAN:    In order of problems listed above:  1. Reverted to normal sinus  rhythm.  Continuing amiodarone therapy as prior to surgery for control rhythm. 2. Elevated blood pressure today but has not had diuretic, or carvedilol.  Educated on target blood pressure 130/80 mmHg.  They will monitor at home. 3. Encourage compliance with CPAP. 4. Continue Eliquis.  Goal is long-term Eliquis therapy despite left atrial clip although risk of stroke will be less if we ever have to go without anticoagulation therapy as she ages. 5. Status post bioprosthetic aortic valve, doing well without auscultatory evidence of dysfunction. 6. Status post coronary bypass grafting, without angina.  Plan clinical follow-up in 6 weeks.  EKG will be done on that visit.  Call if palpitations, chest pain, or increasing dyspnea.   Medication Adjustments/Labs and Tests Ordered: Current medicines are reviewed at length with the patient today.  Concerns regarding medicines are outlined above.  No orders of the defined types were placed in this encounter.  No orders of the defined types were placed in this encounter.   Patient Instructions  Medication Instructions:  Your physician recommends that you continue on your current medications as directed. Please refer to the Current Medication list given to you today.  If you need a refill on your cardiac medications before your next appointment, please call your pharmacy.   Lab work: None If you have labs (blood work) drawn today and your tests are completely normal, you will receive your results only by: [name] MyChart Message (if you have MyChart) OR . A paper copy in the mail If you have any lab test that is abnormal or we need to change your treatment, we will call you to review the results.  Testing/Procedures: None  Follow-Up: At [facility], you and your health needs are our priority.  As part of our continuing mission to provide you with  exceptional heart care, we have created designated Provider Care Teams.  These Care Teams include your primary Cardiologist (physician) and Advanced Practice Providers (APPs -  Physician Assistants and Nurse Practitioners) who all work together to provide you with the care you need, when you need it. You will need a follow up appointment in 6 weeks.  Please call our office 2 months in advance to schedule this appointment.  You may see Sinclair Grooms, MD or one of the following Advanced Practice Providers on your designated Care Team:   Truitt Merle, NP Cecilie Kicks, NP . Kathyrn Drown, NP  Any Other Special Instructions Will Be Listed Below (If Applicable).       Signed, Sinclair Grooms, MD  10/17/2018 9:29 AM    [place]

## 2018-10-17 ENCOUNTER — Telehealth: Payer: Self-pay

## 2018-10-17 ENCOUNTER — Telehealth: Payer: Self-pay | Admitting: Interventional Cardiology

## 2018-10-17 ENCOUNTER — Encounter: Payer: Self-pay | Admitting: Interventional Cardiology

## 2018-10-17 ENCOUNTER — Ambulatory Visit (INDEPENDENT_AMBULATORY_CARE_PROVIDER_SITE_OTHER): Payer: Medicare Other | Admitting: Interventional Cardiology

## 2018-10-17 VITALS — BP 152/62 | HR 73 | Ht 63.0 in | Wt 214.6 lb

## 2018-10-17 DIAGNOSIS — I25708 Atherosclerosis of coronary artery bypass graft(s), unspecified, with other forms of angina pectoris: Secondary | ICD-10-CM

## 2018-10-17 DIAGNOSIS — I4819 Other persistent atrial fibrillation: Secondary | ICD-10-CM

## 2018-10-17 DIAGNOSIS — I10 Essential (primary) hypertension: Secondary | ICD-10-CM

## 2018-10-17 DIAGNOSIS — E669 Obesity, unspecified: Secondary | ICD-10-CM

## 2018-10-17 DIAGNOSIS — E1169 Type 2 diabetes mellitus with other specified complication: Secondary | ICD-10-CM

## 2018-10-17 DIAGNOSIS — G4733 Obstructive sleep apnea (adult) (pediatric): Secondary | ICD-10-CM | POA: Diagnosis not present

## 2018-10-17 DIAGNOSIS — Z5181 Encounter for therapeutic drug level monitoring: Secondary | ICD-10-CM

## 2018-10-17 DIAGNOSIS — Z953 Presence of xenogenic heart valve: Secondary | ICD-10-CM

## 2018-10-17 DIAGNOSIS — Z7901 Long term (current) use of anticoagulants: Secondary | ICD-10-CM

## 2018-10-17 NOTE — Telephone Encounter (Signed)
Spoke with Dr. Tamala Julian and he said orders should go to Dr. [name].  Left detailed message on Kelly's confirmed VM of these recommendations.  Advised her to call back if any questions.

## 2018-10-17 NOTE — Patient Instructions (Signed)
Medication Instructions:  Your physician recommends that you continue on your current medications as directed. Please refer to the Current Medication list given to you today.  If you need a refill on your cardiac medications before your next appointment, please call your pharmacy.   Lab work: None If you have labs (blood work) drawn today and your tests are completely normal, you will receive your results only by: . MyChart Message (if you have MyChart) OR . A paper copy in the mail If you have any lab test that is abnormal or we need to change your treatment, we will call you to review the results.  Testing/Procedures: None  Follow-Up: At [facility], you and your health needs are our priority.  As part of our continuing mission to provide you with exceptional heart care, we have created designated Provider Care Teams.  These Care Teams include your primary Cardiologist (physician) and Advanced Practice Providers (APPs -  Physician Assistants and Nurse Practitioners) who all work together to provide you with the care you need, when you need it. You will need a follow up appointment in 6 weeks.  Please call our office 2 months in advance to schedule this appointment.  You may see Henry W Smith III, MD or one of the following Advanced Practice Providers on your designated Care Team:   Lori Gerhardt, NP Laura Ingold, NP . Jill McDaniel, NP  Any Other Special Instructions Will Be Listed Below (If Applicable).    

## 2018-10-17 NOTE — Telephone Encounter (Signed)
New Message   [name] with [facility] is calling in reference to home health orders. 1 week 1, 2 week 2, 1 week 2 for home physical therapy. Please call to discuss.

## 2018-10-17 NOTE — Telephone Encounter (Signed)
[name], PT with [facility] contacted the office [phone] requesting verbal orders for home health PT 1 time a week for 1 week, 2 times a week for 2 weeks, and 1 time a week for 2 weeks for endurance, mobility, and ambulation.  Verbal orders given via confidential voicemail.  Will await faxed copy for physician signature.

## 2018-10-18 ENCOUNTER — Other Ambulatory Visit: Payer: Self-pay | Admitting: Physician Assistant

## 2018-10-21 ENCOUNTER — Telehealth: Payer: Self-pay

## 2018-10-21 NOTE — Progress Notes (Signed)
Advanced Heart Failure Clinic Note   PCP: Aletha Halim., PA-C PCP-Cardiologist: Sinclair Grooms, MD   HPI:  Doris Jefferson is a 78 y.o. female with chronic diastolic CHF, PAH, HTN, CAD, PAF, h/o OSA on CPAP, DM2, HLD, moderate AS, and obesity.   Presented 09/24/18 for planned cath. Found to have severe 3 v disease and moderate pulmonary HTN and admitted for CABG consideration.  Started on milrinone pre-operatively for optimization of pulmonary pressures. Also started on sildenafil. She underwent CABG x 3 with bioprosthetic AVR and LAA clip on 12/20. Milrinone weaned off 12/28. Underwent DCCV 12/30 for post op Afib.   She presents today for post hospital follow up. She is doing well overall, but has had increasing peripheral edema. She has good UOP on lasix 20 mg daily. She was wearing compression hose, but stopped when she started to have skin breakdown. She denies SOB with ADLs. Did have an episode of vertigo last week that lasted for several hours, but no syncope or no syncope. Symptoms resolved with meclizine. She denies CP, but had none prior to CABG either. Surgical site is healing well, and she is working with PT. Cardiac rehab being set up.   Echo 12/26: 50-55%, trivial AI with mean gradient of 11 mmHg, trivial MR, RV mod reduced, mild to mod TR  [facility] 09/24/18  Ost RCA to Prox RCA lesion is 75% stenosed.  Ost Cx to Prox Cx lesion is 100% stenosed.  Dist LM lesion is 80% stenosed.  Ost 1st Diag lesion is 75% stenosed.  Ost Ramus lesion is 75% stenosed.  Mildto moderateaortic stenosis (Mean gradient 20 mm Hg) Prox LAD lesion is 50% stenosed. Hemodynamics (mmHg) RA mean 8 RV 76/1 PA 76/25 (45) PCWP 16 AO 146/55 LV 166/12 PVR 4.9 WU Cardiac Output (Fick) 5.86 Cardiac Index (Fick) 2.99  Review of systems complete and found to be negative unless listed in HPI.    Past Medical History:  Diagnosis Date  . Depression   . GERD (gastroesophageal reflux disease)     . Heart murmur   . HTN (hypertension)   . Hyperlipidemia   . Iron deficiency anemia   . Obesity (BMI 30-39.9) 03/04/2014  . OSA on CPAP    severe with AHI 31/hr  . PAF (paroxysmal atrial fibrillation) ([place])   . Pneumonia    "twice; years ago" (09/24/2018)  . Skin cancer    "left neck; burned off"  . Type II diabetes mellitus ([place])    Current Outpatient Medications  Medication Sig Dispense Refill  . acetaminophen (TYLENOL) 325 MG tablet Take 650 mg by mouth every 6 (six) hours as needed for moderate pain or headache.    [name] amiodarone (PACERONE) 200 MG tablet Take 1 tablet (200 mg total) by mouth 2 (two) times daily. For 2 weeks, then decrease to 200 mg daily 90 tablet 3  . apixaban (ELIQUIS) 5 MG TABS tablet Take 1 tablet (5 mg total) by mouth 2 (two) times daily. 60 tablet 10  . aspirin EC 81 MG EC tablet Take 1 tablet (81 mg total) by mouth daily.    [name] buPROPion (WELLBUTRIN XL) 300 MG 24 hr tablet Take 300 mg by mouth daily.     . carvedilol (COREG) 3.125 MG tablet Take 1 tablet (3.125 mg total) by mouth 2 (two) times daily with a meal. 60 tablet 3  . doxylamine, Sleep, (UNISOM) 25 MG tablet Take 25 mg by mouth at bedtime as needed for sleep.    [name]  furosemide (LASIX) 20 MG tablet TAKE 1 TABLET BY MOUTH DAILY 90 tablet 3  . glimepiride (AMARYL) 4 MG tablet Take 4 mg by mouth daily with breakfast.    . liraglutide (VICTOZA) 18 MG/3ML SOPN Inject 1.8 mg into the skin daily.     [name] loperamide (IMODIUM) 2 MG capsule Take 1 capsule (2 mg total) by mouth as needed for diarrhea or loose stools. 30 capsule 0  . MAGNESIUM-OXIDE 400 (241.3 Mg) MG tablet TAKE 1 TABLET BY MOUTH TWICE DAILY (Patient taking differently: Take 400 mg by mouth 2 (two) times daily. ) 180 tablet 3  . metFORMIN (GLUCOPHAGE) 500 MG tablet Take 1 tablet (500 mg total) by mouth 2 (two) times daily with a meal. 60 tablet 3  . rosuvastatin (CRESTOR) 20 MG tablet Take 20 mg by mouth daily.    . sildenafil (REVATIO) 20 MG tablet  Take 1 tablet (20 mg total) by mouth 3 (three) times daily. 90 tablet 3  . fexofenadine (ALLEGRA) 60 MG tablet Take 60 mg by mouth daily as needed for allergies.     [name] oxyCODONE (OXY IR/ROXICODONE) 5 MG immediate release tablet Take 1-2 tablets (5-10 mg total) by mouth every 4 (four) hours as needed for severe pain. (Patient not taking: Reported on 10/22/2018) 30 tablet 0   No current facility-administered medications for this encounter.    Allergies  Allergen Reactions  . Minocin [Minocycline Hcl] Swelling and Other (See Comments)    THROAT SWELLING  . Benazepril Cough  . Hydrochlorothiazide Itching  . Tape Rash   Social History   Socioeconomic History  . Marital status: Married    Spouse name: Not on file  . Number of children: Not on file  . Years of education: Not on file  . Highest education level: Not on file  Occupational History  . Not on file  Social Needs  . Financial resource strain: Not on file  . Food insecurity:    Worry: Not on file    Inability: Not on file  . Transportation needs:    Medical: Not on file    Non-medical: Not on file  Tobacco Use  . Smoking status: Never Smoker  . Smokeless tobacco: Never Used  Substance and Sexual Activity  . Alcohol use: Never    Frequency: Never  . Drug use: Never  . Sexual activity: Not Currently  Lifestyle  . Physical activity:    Days per week: Not on file    Minutes per session: Not on file  . Stress: Not on file  Relationships  . Social connections:    Talks on phone: Not on file    Gets together: Not on file    Attends religious service: Not on file    Active member of club or organization: Not on file    Attends meetings of clubs or organizations: Not on file    Relationship status: Not on file  . Intimate partner violence:    Fear of current or ex partner: Not on file    Emotionally abused: Not on file    Physically abused: Not on file    Forced sexual activity: Not on file  Other Topics Concern  .  Not on file  Social History Narrative  . Not on file    Family History  Problem Relation Age of Onset  . Hypertension Father   . Heart disease Father   . Melanoma Mother   . Hypertension Brother    Vitals:   10/22/18  1428  BP: (!) 160/60  Pulse: 68  SpO2: 96%  Weight: 99.2 kg (218 lb 12.8 oz)    Wt Readings from Last 3 Encounters:  10/22/18 99.2 kg (218 lb 12.8 oz)  10/17/18 97.3 kg (214 lb 9.6 oz)  10/09/18 96.4 kg (212 lb 8.4 oz)    PHYSICAL EXAM: General:  Well appearing. No respiratory difficulty HEENT: normal Neck: supple. JVP to jaw. Carotids 2+ bilat; no bruits. No lymphadenopathy or thyromegaly appreciated. Cor: PMI nondisplaced. Regular rate & rhythm. No rubs, gallops or murmurs. Lungs: clear Abdomen: soft, nontender, nondistended. No hepatosplenomegaly. No bruits or masses. Good bowel sounds. Extremities: no cyanosis or clubbing. 2-3+ woody edema, with multiple areas of skin breakdown.  Neuro: alert & oriented x 3, cranial nerves grossly intact. moves all 4 extremities w/o difficulty. Affect pleasant.  ECG today shows NSR with PAC 64 bpm, personally reviewed.  ASSESSMENT & PLAN:  1. Chronic chronic diastolic CHF -MBWG66/5/99 LVEF 55-60%, Grade 2 DD, Mild/Mod AS, Mod MR, mild LAE, RV mild dilated, Mild TR, PA peak pressure 79 mm Hg.  - Echo post-CABG 12/26: 50-55%, trivial AI with mean gradient of 11 mmHg, trivial MR, RV mod reduced, mild to mod TR - NYHA II symptoms - Volume status elevated on exam - Increase lasix to 40 mg BID x 2 days, then increase chronic dose to 40 mg daily. BMET today.  - Will ask [facility] (following for PT) to apply unna boots.   2. Moderate Pulmonary HTN - By cath as above with PA pressurePA76/25 mm/Hg (Mean 45)  - Likely WHO Group III in setting of OSA +/- component of Group II with diastolic CHF. Suspect unlikely to benefit from pulmonary vasodilators. - PA systolics were 35-70VXBL before swan pulled. Echo with evidence of RV  overload. - Continue sildenafil 20 mg TID.   3. Severe 3v CAD - Severe with 75% RCA, 100% Cx, 80% LM and LAD 50% stenosed - S/p CABG x 3 with bioprosthetic AVR and LAA clip on 12/20 - Continue HH PT. Cardiac rehab following for PT completion.  - Continue coreg 3.125 mg BID. Continue Crestor.   4. Moderate AS - s/p bioprosthetic AVR - Echo 12/26: 50-55%, trivial AI with mean gradient of 11 mmHg, trivial MR, RV mod reduced, mild to mod TR. No change.   5. PAF - EKG today shows NSR with PAC 64 bpm s/p DCCV 10/07/18 - Continue Eliquis 5 mg BID (restarted 12/26 pm) - Continue amiodarone 200 mg daily.   6. OSA on CPAP - Encouraged nightly compliance.   7. DM 2 - Per PCP.   8. AKI/Suspected ATN - Creatinine 1.43 on discharge. BMET today.   9. Hypokalemia - K 4.1 on discharge. BMET today.   10. HTN - Meds as above.   Volume overloaded on exam with chronic venous stasis changes including areas of blistering and skin breakdown. Will diurese and see back in 2 weeks. Orders sent to [facility] for unna boots.   Shirley Friar, PA-C 10/22/18   Greater than 50% of the 25 minute visit was spent in counseling/coordination of care regarding disease state education, salt/fluid restriction, sliding scale diuretics, and medication compliance.

## 2018-10-21 NOTE — Telephone Encounter (Signed)
Belenda Cruise, OT with [facility] contacted the office [phone] requesting verbal orders for OT 1 time a week for 4 weeks.  Ms. Cumbie is s/p CABG x3 with Dr. [name] on 09/27/18. Verbal orders given.  Will await faxed copy for physician signature.

## 2018-10-22 ENCOUNTER — Ambulatory Visit (HOSPITAL_COMMUNITY)
Admit: 2018-10-22 | Discharge: 2018-10-22 | Disposition: A | Discharge status: Home / Self Care | Payer: Medicare Other | Source: Ambulatory Visit | Attending: Internal Medicine | Admitting: Internal Medicine

## 2018-10-22 ENCOUNTER — Other Ambulatory Visit: Payer: Self-pay

## 2018-10-22 ENCOUNTER — Encounter (HOSPITAL_COMMUNITY): Payer: Self-pay

## 2018-10-22 ENCOUNTER — Telehealth (HOSPITAL_COMMUNITY): Payer: Self-pay

## 2018-10-22 VITALS — BP 160/60 | HR 68 | Wt 218.8 lb

## 2018-10-22 DIAGNOSIS — I2510 Atherosclerotic heart disease of native coronary artery without angina pectoris: Secondary | ICD-10-CM | POA: Insufficient documentation

## 2018-10-22 DIAGNOSIS — K219 Gastro-esophageal reflux disease without esophagitis: Secondary | ICD-10-CM | POA: Insufficient documentation

## 2018-10-22 DIAGNOSIS — I25708 Atherosclerosis of coronary artery bypass graft(s), unspecified, with other forms of angina pectoris: Secondary | ICD-10-CM | POA: Diagnosis not present

## 2018-10-22 DIAGNOSIS — I878 Other specified disorders of veins: Secondary | ICD-10-CM

## 2018-10-22 DIAGNOSIS — E876 Hypokalemia: Secondary | ICD-10-CM | POA: Diagnosis not present

## 2018-10-22 DIAGNOSIS — I491 Atrial premature depolarization: Secondary | ICD-10-CM | POA: Diagnosis not present

## 2018-10-22 DIAGNOSIS — I10 Essential (primary) hypertension: Secondary | ICD-10-CM

## 2018-10-22 DIAGNOSIS — Z951 Presence of aortocoronary bypass graft: Secondary | ICD-10-CM | POA: Diagnosis not present

## 2018-10-22 DIAGNOSIS — I480 Paroxysmal atrial fibrillation: Secondary | ICD-10-CM | POA: Insufficient documentation

## 2018-10-22 DIAGNOSIS — G4733 Obstructive sleep apnea (adult) (pediatric): Secondary | ICD-10-CM | POA: Insufficient documentation

## 2018-10-22 DIAGNOSIS — I110 Hypertensive heart disease with heart failure: Secondary | ICD-10-CM | POA: Diagnosis present

## 2018-10-22 DIAGNOSIS — E785 Hyperlipidemia, unspecified: Secondary | ICD-10-CM | POA: Insufficient documentation

## 2018-10-22 DIAGNOSIS — Z8249 Family history of ischemic heart disease and other diseases of the circulatory system: Secondary | ICD-10-CM | POA: Diagnosis not present

## 2018-10-22 DIAGNOSIS — R011 Cardiac murmur, unspecified: Secondary | ICD-10-CM | POA: Insufficient documentation

## 2018-10-22 DIAGNOSIS — I5032 Chronic diastolic (congestive) heart failure: Secondary | ICD-10-CM | POA: Diagnosis not present

## 2018-10-22 DIAGNOSIS — Z79899 Other long term (current) drug therapy: Secondary | ICD-10-CM | POA: Insufficient documentation

## 2018-10-22 DIAGNOSIS — Z7901 Long term (current) use of anticoagulants: Secondary | ICD-10-CM | POA: Diagnosis not present

## 2018-10-22 DIAGNOSIS — F329 Major depressive disorder, single episode, unspecified: Secondary | ICD-10-CM | POA: Insufficient documentation

## 2018-10-22 DIAGNOSIS — E669 Obesity, unspecified: Secondary | ICD-10-CM | POA: Insufficient documentation

## 2018-10-22 DIAGNOSIS — I272 Pulmonary hypertension, unspecified: Secondary | ICD-10-CM | POA: Diagnosis not present

## 2018-10-22 DIAGNOSIS — E7849 Other hyperlipidemia: Secondary | ICD-10-CM

## 2018-10-22 DIAGNOSIS — Z452 Encounter for adjustment and management of vascular access device: Secondary | ICD-10-CM | POA: Insufficient documentation

## 2018-10-22 DIAGNOSIS — E119 Type 2 diabetes mellitus without complications: Secondary | ICD-10-CM | POA: Diagnosis not present

## 2018-10-22 DIAGNOSIS — Z6839 Body mass index (BMI) 39.0-39.9, adult: Secondary | ICD-10-CM | POA: Diagnosis not present

## 2018-10-22 DIAGNOSIS — Z7984 Long term (current) use of oral hypoglycemic drugs: Secondary | ICD-10-CM | POA: Insufficient documentation

## 2018-10-22 DIAGNOSIS — Z7982 Long term (current) use of aspirin: Secondary | ICD-10-CM | POA: Diagnosis not present

## 2018-10-22 DIAGNOSIS — Z953 Presence of xenogenic heart valve: Secondary | ICD-10-CM | POA: Diagnosis not present

## 2018-10-22 DIAGNOSIS — N179 Acute kidney failure, unspecified: Secondary | ICD-10-CM | POA: Insufficient documentation

## 2018-10-22 LAB — BASIC METABOLIC PANEL
Anion gap: 12 (ref 5–15)
BUN: 14 mg/dL (ref 8–23)
CO2: 22 mmol/L (ref 22–32)
Calcium: 8.6 mg/dL — ABNORMAL LOW (ref 8.9–10.3)
Chloride: 104 mmol/L (ref 98–111)
Creatinine, Ser: 1.43 mg/dL — ABNORMAL HIGH (ref 0.44–1.00)
GFR calc Af Amer: 41 mL/min — ABNORMAL LOW (ref >60)
GFR calc non Af Amer: 35 mL/min — ABNORMAL LOW (ref >60)
Glucose, Bld: 106 mg/dL — ABNORMAL HIGH (ref 70–99)
Potassium: 3.8 mmol/L (ref 3.5–5.1)
Sodium: 138 mmol/L (ref 135–145)

## 2018-10-22 NOTE — Patient Instructions (Addendum)
INCREASE Lasix 40 mg BID for 3 doses, then increase to 40 mg daily.  Labs today We will only contact you if something comes back abnormal or we need to make some changes. Otherwise no news is good news!  You have been referred to [facility] for wound care for unna boots.  Your physician recommends that you schedule a follow-up appointment in: 2 weeks  in the Advanced Practitioners (PA/NP) Clinic    Do the following things EVERYDAY: 1) Weigh yourself in the morning before breakfast. Write it down and keep it in a log. 2) Take your medicines as prescribed 3) Eat low salt foods-Limit salt (sodium) to 2000 mg per day.  4) Stay as active as you can everyday 5) Limit all fluids for the day to less than 2 liters

## 2018-10-22 NOTE — Telephone Encounter (Signed)
Pt insurance is active and benefits verified through Medicare A/B. Co-pay $0.00, DED $198.00/$0.00 met, out of pocket $0.00/$0.00 met, co-insurance 20%. No pre-authorization required. Passport, 10/22/2018 @ 8:17AM, REF# [id number]  2ndary  insurance is active and benefits verified through [facility]. Co-pay $0.00, DED $0.00/$0.00 met, out of pocket $0.00/$0.00 met, co-insurance 0%. No pre-authorization required. Passport, 10/22/2018 @ 8:18AM, REF# [id number]  Will contact patient to see if she is interested in the Cardiac Rehab Program. If interested, patient will need to complete follow up appt. Once completed, patient will be contacted for scheduling upon review by the RN Navigator.

## 2018-10-22 NOTE — Telephone Encounter (Signed)
Called patient to see if she is interested in the Cardiac Rehab Program. Patient expressed interest, but stated she is about to start recv'ing HHPT. Adv pt she will need to 1st complete HHPT before she can start CR, she stated she understood.   Pt also stated HHPT may be up to four weeks, will follow up with pt.

## 2018-10-31 ENCOUNTER — Other Ambulatory Visit: Payer: Self-pay | Admitting: Cardiothoracic Surgery

## 2018-10-31 DIAGNOSIS — Z951 Presence of aortocoronary bypass graft: Secondary | ICD-10-CM

## 2018-10-31 NOTE — Progress Notes (Unsigned)
cxr 

## 2018-11-04 ENCOUNTER — Ambulatory Visit
Admission: RE | Admit: 2018-11-04 | Discharge: 2018-11-04 | Disposition: A | Discharge status: Home / Self Care | Payer: Medicare Other | Source: Ambulatory Visit | Attending: Cardiothoracic Surgery | Admitting: Cardiothoracic Surgery

## 2018-11-04 ENCOUNTER — Ambulatory Visit (INDEPENDENT_AMBULATORY_CARE_PROVIDER_SITE_OTHER): Payer: Self-pay | Admitting: Surgical

## 2018-11-04 VITALS — BP 180/77 | HR 78 | Resp 20 | Ht 63.0 in | Wt 192.0 lb

## 2018-11-04 DIAGNOSIS — Z951 Presence of aortocoronary bypass graft: Secondary | ICD-10-CM

## 2018-11-04 NOTE — Patient Instructions (Signed)
Discussed activity progression 

## 2018-11-04 NOTE — Progress Notes (Signed)
[address]       [place] [address]             [phone]      Marvelous O Lagrange [place] Medical Record #[id number] Date of Birth: [birth year]  Referring: Jettie Booze, MD Primary Care: Aletha Halim., PA-C Primary Cardiologist: Sinclair Grooms, MD   Chief Complaint:   POST OP FOLLOW UP OPERATIVE REPORT  DATE OF PROCEDURE:  09/27/2018  PREOPERATIVE DIAGNOSIS:  Critical left main coronary artery disease with ostial right moderate aortic stenosis, pulmonary hypertension.  POSTOPERATIVE DIAGNOSIS:  Critical left main coronary artery disease with ostial right moderate aortic stenosis, pulmonary hypertension.  SURGICAL PROCEDURE:  Coronary artery bypass grafting x3 with the left internal mammary to the left anterior descending coronary artery, reverse saphenous vein graft to the obtuse marginal coronary artery, reverse saphenous vein graft to the right  coronary artery with right thigh greater saphenous endoscopic vein harvesting and aortic valve replacement with pericardial tissue valve, [employer] model #3300TFX 21 mm, serial #[id number] and placement of AtriCure left atrial clip 35 mm.  SURGEON:  Lanelle Bal, MD  FIRST ASSISTANT:  Ellwood Handler, PA-C    History of Present Illness:    The patient is a 78 year old female status post the above described procedure seen in the office on today's date and routine postsurgical follow-up.  Overall she reports that she is feeling pretty well.  She does have some shortness of breath if she overexerts herself but otherwise is asymptomatic in that regard.  She primarily walks with a walker but on occasion if she feels strong enough and is a short enough distance she will walk without it.  She does not have any chest pain or anginal equivalents and is not having any surgical pain either.  She has had no fevers chills or other constitutional symptoms.  She does have peripheral edema and legs are  currently wrapped.  This was done by the home health agency and per her report the swelling is much improved.  She has been seen by the advanced heart failure team in follow-up as well as Dr. Tamala Julian.  They have been making adjustments on medications.  She has not had any palpitations.  Overall she is pleased with her progress considering the magnitude of the operation, her chronic medical comorbidities and age.      Past Medical History:  Diagnosis Date  . Depression   . GERD (gastroesophageal reflux disease)   . Heart murmur   . HTN (hypertension)   . Hyperlipidemia   . Iron deficiency anemia   . Obesity (BMI 30-39.9) 03/04/2014  . OSA on CPAP    severe with AHI 31/hr  . PAF (paroxysmal atrial fibrillation) ([place])   . Pneumonia    "twice; years ago" (09/24/2018)  . Skin cancer    "left neck; burned off"  . Type II diabetes mellitus ([facility])      Social History   Tobacco Use  Smoking Status Never Smoker  Smokeless Tobacco Never Used    Social History   Substance and Sexual Activity  Alcohol Use Never  . Frequency: Never     Allergies  Allergen Reactions  . Minocin [Minocycline Hcl] Swelling and Other (See Comments)    THROAT SWELLING  . Benazepril Cough  . Hydrochlorothiazide Itching  . Tape Rash    Current Outpatient Medications  Medication Sig Dispense Refill  . acetaminophen (TYLENOL) 325 MG tablet Take 650 mg  by mouth every 6 (six) hours as needed for moderate pain or headache.    [name] amiodarone (PACERONE) 200 MG tablet Take 1 tablet (200 mg total) by mouth 2 (two) times daily. For 2 weeks, then decrease to 200 mg daily 90 tablet 3  . apixaban (ELIQUIS) 5 MG TABS tablet Take 1 tablet (5 mg total) by mouth 2 (two) times daily. 60 tablet 10  . aspirin EC 81 MG EC tablet Take 1 tablet (81 mg total) by mouth daily.    [name] buPROPion (WELLBUTRIN XL) 300 MG 24 hr tablet Take 300 mg by mouth daily.     . carvedilol (COREG) 3.125 MG tablet Take 1 tablet (3.125 mg total) by  mouth 2 (two) times daily with a meal. 60 tablet 3  . doxylamine, Sleep, (UNISOM) 25 MG tablet Take 25 mg by mouth at bedtime as needed for sleep.    . fexofenadine (ALLEGRA) 60 MG tablet Take 60 mg by mouth daily as needed for allergies.     . furosemide (LASIX) 20 MG tablet TAKE 1 TABLET BY MOUTH DAILY 90 tablet 3  . glimepiride (AMARYL) 4 MG tablet Take 4 mg by mouth daily with breakfast.    . liraglutide (VICTOZA) 18 MG/3ML SOPN Inject 1.8 mg into the skin daily.     [name] loperamide (IMODIUM) 2 MG capsule Take 1 capsule (2 mg total) by mouth as needed for diarrhea or loose stools. 30 capsule 0  . MAGNESIUM-OXIDE 400 (241.3 Mg) MG tablet TAKE 1 TABLET BY MOUTH TWICE DAILY (Patient taking differently: Take 400 mg by mouth 2 (two) times daily. ) 180 tablet 3  . metFORMIN (GLUCOPHAGE) 500 MG tablet Take 1 tablet (500 mg total) by mouth 2 (two) times daily with a meal. 60 tablet 3  . oxyCODONE (OXY IR/ROXICODONE) 5 MG immediate release tablet Take 1-2 tablets (5-10 mg total) by mouth every 4 (four) hours as needed for severe pain. 30 tablet 0  . rosuvastatin (CRESTOR) 20 MG tablet Take 20 mg by mouth daily.    . sildenafil (REVATIO) 20 MG tablet Take 1 tablet (20 mg total) by mouth 3 (three) times daily. 90 tablet 3   No current facility-administered medications for this visit.        Physical Exam: BP (!) 180/77   Pulse 78   Resp 20   Ht 5\' 3"  (1.6 m)   Wt 87.1 kg   LMP  (LMP Unknown)   SpO2 96% Comment: RA  BMI 34.01 kg/m   General appearance: alert, cooperative and no distress Heart: regular rate and rhythm and 2/6 systolic murmur Lungs: clear to auscultation bilaterally Abdomen: Benign exam, obese Extremities: Minor bilateral lower extremity edema, multilayer elastic wrap in place Wound: Incisions without evidence of infection   Diagnostic Studies & Laboratory data:     Recent Radiology Findings:   Dg Chest 2 View  Result Date: 11/04/2018 CLINICAL DATA:  Status post CABG,  AVR 09/27/2018 EXAM: CHEST - 2 VIEW COMPARISON:  10/06/2018 FINDINGS: No pleural effusion or pneumothorax. No focal consolidation. Left basilar atelectasis. Rounded opacity in the left mid lung likely reflecting loculated pleural fluid. Stable cardiomediastinal silhouette. Prior CABG. No acute osseous abnormality. IMPRESSION: 1. No evidence of pulmonary edema. 2. Left basilar atelectasis. Rounded opacity in the left mid lung likely reflecting loculated pleural fluid. Electronically Signed   By: Kathreen Devoid   On: 11/04/2018 14:01      Recent Lab Findings: Lab Results  Component Value Date   WBC  12.5 (H) 10/08/2018   HGB 9.3 (L) 10/08/2018   HCT 31.1 (L) 10/08/2018   PLT 229 10/08/2018   GLUCOSE 106 (H) 10/22/2018   CHOL 145 05/08/2018   TRIG 152 (H) 05/08/2018   HDL 43 05/08/2018   LDLCALC 72 05/08/2018   ALT 53 (H) 09/30/2018   AST 30 09/30/2018   NA 138 10/22/2018   K 3.8 10/22/2018   CL 104 10/22/2018   CREATININE 1.43 (H) 10/22/2018   BUN 14 10/22/2018   CO2 22 10/22/2018   TSH 3.000 05/08/2018   INR 1.47 09/27/2018   HGBA1C 5.8 (H) 10/04/2018      Assessment / Plan: Patient continues to make good progress.  She has multiple medical comorbidities and will continue to be seen in both the advanced heart failure and cardiology offices for medical management.  I did note her elevated blood pressure during this visit but she reports that other readings prior to this have been much better and that she feels quite nervous being in the office.  She knows to follow-up for this as well.  She also admits to some mild constipation and I instructed her further on some over-the-counter options for treatment I did not make any other  changes to her current medical regimen.  We will see her again in 1 month and if there are no surgical issues at that time she may not require any further follow-up.         John Giovanni PA-C 11/04/2018 2:11 PM

## 2018-11-05 ENCOUNTER — Telehealth: Payer: Self-pay | Admitting: *Deleted

## 2018-11-05 ENCOUNTER — Inpatient Hospital Stay: Payer: Medicare Other | Attending: Family Medicine

## 2018-11-05 ENCOUNTER — Telehealth: Payer: Self-pay | Admitting: Oncology

## 2018-11-05 ENCOUNTER — Inpatient Hospital Stay (HOSPITAL_BASED_OUTPATIENT_CLINIC_OR_DEPARTMENT_OTHER): Payer: Medicare Other | Admitting: Oncology

## 2018-11-05 VITALS — BP 145/52 | HR 75 | Temp 98.3°F | Resp 18 | Ht 63.0 in | Wt 191.2 lb

## 2018-11-05 DIAGNOSIS — D508 Other iron deficiency anemias: Secondary | ICD-10-CM

## 2018-11-05 DIAGNOSIS — Z7982 Long term (current) use of aspirin: Secondary | ICD-10-CM | POA: Diagnosis not present

## 2018-11-05 DIAGNOSIS — D509 Iron deficiency anemia, unspecified: Secondary | ICD-10-CM

## 2018-11-05 DIAGNOSIS — Z7984 Long term (current) use of oral hypoglycemic drugs: Secondary | ICD-10-CM | POA: Diagnosis not present

## 2018-11-05 DIAGNOSIS — Z79899 Other long term (current) drug therapy: Secondary | ICD-10-CM | POA: Diagnosis not present

## 2018-11-05 DIAGNOSIS — Z7901 Long term (current) use of anticoagulants: Secondary | ICD-10-CM | POA: Insufficient documentation

## 2018-11-05 LAB — CBC WITH DIFFERENTIAL (CANCER CENTER ONLY)
Abs Immature Granulocytes: 0.04 10*3/uL (ref 0.00–0.07)
BASOS ABS: 0 10*3/uL (ref 0.0–0.1)
Basophils Relative: 0 %
Eosinophils Absolute: 0.1 10*3/uL (ref 0.0–0.5)
Eosinophils Relative: 1 %
HEMATOCRIT: 38.2 % (ref 36.0–46.0)
Hemoglobin: 11.7 g/dL — ABNORMAL LOW (ref 12.0–15.0)
IMMATURE GRANULOCYTES: 0 %
Lymphocytes Relative: 20 %
Lymphs Abs: 1.9 10*3/uL (ref 0.7–4.0)
MCH: 27.9 pg (ref 26.0–34.0)
MCHC: 30.6 g/dL (ref 30.0–36.0)
MCV: 91.2 fL (ref 80.0–100.0)
Monocytes Absolute: 0.8 10*3/uL (ref 0.1–1.0)
Monocytes Relative: 8 %
Neutro Abs: 6.8 10*3/uL (ref 1.7–7.7)
Neutrophils Relative %: 71 %
Platelet Count: 256 10*3/uL (ref 150–400)
RBC: 4.19 MIL/uL (ref 3.87–5.11)
RDW: 14.6 % (ref 11.5–15.5)
WBC Count: 9.7 10*3/uL (ref 4.0–10.5)
nRBC: 0 % (ref 0.0–0.2)

## 2018-11-05 LAB — IRON AND TIBC
Iron: 43 ug/dL (ref 41–142)
Saturation Ratios: 16 % — ABNORMAL LOW (ref 21–57)
TIBC: 265 ug/dL (ref 236–444)
UIBC: 222 ug/dL (ref 120–384)

## 2018-11-05 LAB — FERRITIN: Ferritin: 137 ng/mL (ref 11–307)

## 2018-11-05 NOTE — Progress Notes (Signed)
Hematology and Oncology Follow Up Visit  Doris Jefferson [id number] 30-Oct-1940 78 y.o. 11/05/2018 10:42 AM Doris Jefferson., [place], [place]*   Principle Diagnosis: 78 year old woman with iron deficiency anemia diagnosed in July 2019.  She presented with a hemoglobin 9.7 with microcytosis and iron saturation of 13%.  Her ferritin was 23.  Iron deficiency is related to poor iron absorption.  GI work-up did not show any active bleeding.   Prior Therapy:  Iron infusion completed in August 2019.  Current therapy: Active surveillance.  Interim History: Ms. Sand returns today for repeat evaluation.  Since last visit, she underwent coronary artery bypass surgery completed on September 27, 2018 completed by Dr. [name].  She is recovering reasonably well at this time but did require packed red cell transfusion in early part of January 2020.  She is ambulating with the help of a walker without any falls or syncope.  She denies any hematochezia or melena.  She denies any ice cravings or excessive fatigue.    She tolerated intravenous iron infusion in August 2019 without any complications.  She did report improvement in her performance status at that time.  She does not report any headaches, blurry vision, syncope or seizures. Does not report any fevers, chills or sweats.  Does not report any cough, wheezing or hemoptysis.  Does not report any chest pain, palpitation, orthopnea or leg edema.  Does not report any nausea, vomiting or abdominal pain.  Does not report any changes in bowel habits.  Does not report any skeletal complaints.    Does not report frequency, urgency or hematuria.  Does not report any skin rashes or lesions.  Does not report any bleeding or clotting tendency.  Does not report any mood changes.  Remaining review of systems is negative.    Medications: I have reviewed the patient's current medications.  Current Outpatient Medications  Medication Sig Dispense Refill  .  acetaminophen (TYLENOL) 325 MG tablet Take 650 mg by mouth every 6 (six) hours as needed for moderate pain or headache.    [name] amiodarone (PACERONE) 200 MG tablet Take 1 tablet (200 mg total) by mouth 2 (two) times daily. For 2 weeks, then decrease to 200 mg daily 90 tablet 3  . apixaban (ELIQUIS) 5 MG TABS tablet Take 1 tablet (5 mg total) by mouth 2 (two) times daily. 60 tablet 10  . aspirin EC 81 MG EC tablet Take 1 tablet (81 mg total) by mouth daily.    [name] buPROPion (WELLBUTRIN XL) 300 MG 24 hr tablet Take 300 mg by mouth daily.     . carvedilol (COREG) 3.125 MG tablet Take 1 tablet (3.125 mg total) by mouth 2 (two) times daily with a meal. 60 tablet 3  . doxylamine, Sleep, (UNISOM) 25 MG tablet Take 25 mg by mouth at bedtime as needed for sleep.    . fexofenadine (ALLEGRA) 60 MG tablet Take 60 mg by mouth daily as needed for allergies.     . furosemide (LASIX) 20 MG tablet TAKE 1 TABLET BY MOUTH DAILY 90 tablet 3  . glimepiride (AMARYL) 4 MG tablet Take 4 mg by mouth daily with breakfast.    . liraglutide (VICTOZA) 18 MG/3ML SOPN Inject 1.8 mg into the skin daily.     [name] loperamide (IMODIUM) 2 MG capsule Take 1 capsule (2 mg total) by mouth as needed for diarrhea or loose stools. 30 capsule 0  . MAGNESIUM-OXIDE 400 (241.3 Mg) MG tablet TAKE 1 TABLET BY MOUTH TWICE DAILY (  Patient taking differently: Take 400 mg by mouth 2 (two) times daily. ) 180 tablet 3  . metFORMIN (GLUCOPHAGE) 500 MG tablet Take 1 tablet (500 mg total) by mouth 2 (two) times daily with a meal. 60 tablet 3  . oxyCODONE (OXY IR/ROXICODONE) 5 MG immediate release tablet Take 1-2 tablets (5-10 mg total) by mouth every 4 (four) hours as needed for severe pain. 30 tablet 0  . rosuvastatin (CRESTOR) 20 MG tablet Take 20 mg by mouth daily.    . sildenafil (REVATIO) 20 MG tablet Take 1 tablet (20 mg total) by mouth 3 (three) times daily. 90 tablet 3   No current facility-administered medications for this visit.      Allergies:   Allergies  Allergen Reactions  . Minocin [Minocycline Hcl] Swelling and Other (See Comments)    THROAT SWELLING  . Benazepril Cough  . Hydrochlorothiazide Itching  . Tape Rash    Past Medical History, Surgical history, Social history, and Family History were reviewed and updated.   Physical Exam: Blood pressure (!) 145/52, pulse 75, temperature 98.3 F (36.8 C), temperature source Oral, resp. rate 18, height 5\' 3"  (1.6 m), weight 191 lb 3.2 oz (86.7 kg), SpO2 97 %.   ECOG: 1   General appearance: Comfortable appearing without any discomfort Head: Normocephalic without any trauma Oropharynx: Mucous membranes are moist and pink without any thrush or ulcers. Eyes: Pupils are equal and round reactive to light. Lymph nodes: No cervical, supraclavicular, inguinal or axillary lymphadenopathy.   Heart:regular rate and rhythm.  S1 and S2.  Lower extremity wrapped without any worsening edema. Lung: Clear without any rhonchi or wheezes.  No dullness to percussion. Abdomin: Soft, nontender, nondistended with good bowel sounds.  No hepatosplenomegaly. Musculoskeletal: No joint deformity or effusion.  Full range of motion noted. Neurological: No deficits noted on motor, sensory and deep tendon reflex exam. Skin: No erythema or induration noted on her chest wall incision. Psychiatric: Mood and affect appeared appropriate.     Lab Results: Lab Results  Component Value Date   WBC 12.5 (H) 10/08/2018   HGB 9.3 (L) 10/08/2018   HCT 31.1 (L) 10/08/2018   MCV 93.4 10/08/2018   PLT 229 10/08/2018     Chemistry      Component Value Date/Time   NA 138 10/22/2018 1506   NA 144 09/19/2018 1053   K 3.8 10/22/2018 1506   CL 104 10/22/2018 1506   CO2 22 10/22/2018 1506   BUN 14 10/22/2018 1506   BUN 15 09/19/2018 1053   CREATININE 1.43 (H) 10/22/2018 1506   CREATININE 1.00 (H) 09/06/2015 1051      Component Value Date/Time   CALCIUM 8.6 (L) 10/22/2018 1506   ALKPHOS 58 09/30/2018  0533   AST 30 09/30/2018 0533   ALT 53 (H) 09/30/2018 0533   BILITOT 0.7 09/30/2018 0533   BILITOT 0.5 05/08/2018 0820       Radiological Studies: Dg Chest 2 View  Result Date: 11/04/2018 CLINICAL DATA:  Status post CABG, AVR 09/27/2018 EXAM: CHEST - 2 VIEW COMPARISON:  10/06/2018 FINDINGS: No pleural effusion or pneumothorax. No focal consolidation. Left basilar atelectasis. Rounded opacity in the left mid lung likely reflecting loculated pleural fluid. Stable cardiomediastinal silhouette. Prior CABG. No acute osseous abnormality. IMPRESSION: 1. No evidence of pulmonary edema. 2. Left basilar atelectasis. Rounded opacity in the left mid lung likely reflecting loculated pleural fluid. Electronically Signed   By: Kathreen Devoid   On: 11/04/2018 14:01  Impression and Plan:  78 year old woman with the following:  1.    Anemia noted in July 2019.  She has iron deficiency anemia noted at the time.  Her iron deficiency is related to poor iron absorption.  She received intravenous iron in the form of Feraheme in August 2019 with excellent improvement in her symptoms.  Iron studies are repeated today and they are currently pending but her hemoglobin has improved.  His hemoglobin today is up to 11.7 and does not require any transfusion.    Risks and benefits of repeat intravenous iron in the future was reiterated.  These complications include arthralgias, myalgias and infusion related complications.  She is willing to proceed if her iron studies are low.  2.  Follow-up: We will be in 6 months to follow her progress.  15  minutes was spent with the patient face-to-face today.  More than 50% of time was dedicated to reviewing laboratory data, treatment options and coordinating future plan of care.     Zola Button, MD 1/28/202010:42 AM

## 2018-11-05 NOTE — Telephone Encounter (Signed)
Gave AVS and calendar °

## 2018-11-05 NOTE — Telephone Encounter (Signed)
Spoke with patient. Per dr. Alen Blew, patient's iron is normal and she does not need an iron infusion.

## 2018-11-05 NOTE — Telephone Encounter (Signed)
-----   Message from Wyatt Portela, MD sent at 11/05/2018 12:00 PM EST ----- Please let her know her iron is normal. No need for IV iron at this time.

## 2018-11-07 ENCOUNTER — Telehealth (HOSPITAL_COMMUNITY): Payer: Self-pay | Admitting: Cardiology

## 2018-11-07 ENCOUNTER — Ambulatory Visit (HOSPITAL_COMMUNITY)
Admission: RE | Admit: 2018-11-07 | Discharge: 2018-11-07 | Disposition: A | Discharge status: Home / Self Care | Payer: Medicare Other | Source: Ambulatory Visit | Attending: Cardiology | Admitting: Cardiology

## 2018-11-07 ENCOUNTER — Ambulatory Visit: Payer: Medicare Other | Admitting: Cardiothoracic Surgery

## 2018-11-07 ENCOUNTER — Encounter (HOSPITAL_COMMUNITY): Payer: Self-pay

## 2018-11-07 VITALS — BP 184/96 | HR 86 | Wt 189.8 lb

## 2018-11-07 DIAGNOSIS — Z85828 Personal history of other malignant neoplasm of skin: Secondary | ICD-10-CM | POA: Diagnosis not present

## 2018-11-07 DIAGNOSIS — Z8249 Family history of ischemic heart disease and other diseases of the circulatory system: Secondary | ICD-10-CM | POA: Diagnosis not present

## 2018-11-07 DIAGNOSIS — I5032 Chronic diastolic (congestive) heart failure: Secondary | ICD-10-CM

## 2018-11-07 DIAGNOSIS — E785 Hyperlipidemia, unspecified: Secondary | ICD-10-CM | POA: Insufficient documentation

## 2018-11-07 DIAGNOSIS — I272 Pulmonary hypertension, unspecified: Secondary | ICD-10-CM

## 2018-11-07 DIAGNOSIS — I130 Hypertensive heart and chronic kidney disease with heart failure and stage 1 through stage 4 chronic kidney disease, or unspecified chronic kidney disease: Secondary | ICD-10-CM | POA: Insufficient documentation

## 2018-11-07 DIAGNOSIS — I480 Paroxysmal atrial fibrillation: Secondary | ICD-10-CM | POA: Diagnosis not present

## 2018-11-07 DIAGNOSIS — E1122 Type 2 diabetes mellitus with diabetic chronic kidney disease: Secondary | ICD-10-CM | POA: Insufficient documentation

## 2018-11-07 DIAGNOSIS — I10 Essential (primary) hypertension: Secondary | ICD-10-CM | POA: Diagnosis not present

## 2018-11-07 DIAGNOSIS — I2510 Atherosclerotic heart disease of native coronary artery without angina pectoris: Secondary | ICD-10-CM | POA: Diagnosis not present

## 2018-11-07 DIAGNOSIS — Z951 Presence of aortocoronary bypass graft: Secondary | ICD-10-CM | POA: Insufficient documentation

## 2018-11-07 DIAGNOSIS — E669 Obesity, unspecified: Secondary | ICD-10-CM | POA: Insufficient documentation

## 2018-11-07 DIAGNOSIS — I4819 Other persistent atrial fibrillation: Secondary | ICD-10-CM | POA: Diagnosis not present

## 2018-11-07 DIAGNOSIS — Z7982 Long term (current) use of aspirin: Secondary | ICD-10-CM | POA: Diagnosis not present

## 2018-11-07 DIAGNOSIS — Z7901 Long term (current) use of anticoagulants: Secondary | ICD-10-CM | POA: Diagnosis not present

## 2018-11-07 DIAGNOSIS — Z888 Allergy status to other drugs, medicaments and biological substances status: Secondary | ICD-10-CM | POA: Diagnosis not present

## 2018-11-07 DIAGNOSIS — F329 Major depressive disorder, single episode, unspecified: Secondary | ICD-10-CM | POA: Diagnosis not present

## 2018-11-07 DIAGNOSIS — Z953 Presence of xenogenic heart valve: Secondary | ICD-10-CM | POA: Insufficient documentation

## 2018-11-07 DIAGNOSIS — Z6833 Body mass index (BMI) 33.0-33.9, adult: Secondary | ICD-10-CM | POA: Insufficient documentation

## 2018-11-07 DIAGNOSIS — N183 Chronic kidney disease, stage 3 (moderate): Secondary | ICD-10-CM | POA: Insufficient documentation

## 2018-11-07 DIAGNOSIS — G4733 Obstructive sleep apnea (adult) (pediatric): Secondary | ICD-10-CM

## 2018-11-07 DIAGNOSIS — Z7984 Long term (current) use of oral hypoglycemic drugs: Secondary | ICD-10-CM | POA: Diagnosis not present

## 2018-11-07 DIAGNOSIS — Z79899 Other long term (current) drug therapy: Secondary | ICD-10-CM | POA: Diagnosis not present

## 2018-11-07 LAB — BASIC METABOLIC PANEL
Anion gap: 13 (ref 5–15)
BUN: 12 mg/dL (ref 8–23)
CALCIUM: 9 mg/dL (ref 8.9–10.3)
CO2: 23 mmol/L (ref 22–32)
Chloride: 101 mmol/L (ref 98–111)
Creatinine, Ser: 1.09 mg/dL — ABNORMAL HIGH (ref 0.44–1.00)
GFR calc Af Amer: 57 mL/min — ABNORMAL LOW (ref >60)
GFR, EST NON AFRICAN AMERICAN: 49 mL/min — AB (ref >60)
Glucose, Bld: 96 mg/dL (ref 70–99)
Potassium: 3 mmol/L — ABNORMAL LOW (ref 3.5–5.1)
Sodium: 137 mmol/L (ref 135–145)

## 2018-11-07 MED ORDER — POTASSIUM CHLORIDE CRYS ER 20 MEQ PO TBCR: 40.0000 meq | EXTENDED_RELEASE_TABLET | Freq: Every day | ORAL | 3 refills | Status: DC

## 2018-11-07 NOTE — Telephone Encounter (Signed)
Notes recorded by Kerry Dory, CMA on 11/07/2018 at 3:43 PM EST Patient aware. Patient voiced understanding, repeat labs 2/6  ------  Notes recorded by Darrick Grinder D, NP on 11/07/2018 at 3:13 PM EST Potassium low. Start 40 meq potassium daily. Repeat BMEt next week.

## 2018-11-07 NOTE — Telephone Encounter (Signed)
-----   Message from Conrad [place], NP sent at 11/07/2018  3:13 PM EST ----- Potassium low. Start 40 meq potassium daily. Repeat BMEt next week.

## 2018-11-07 NOTE — Progress Notes (Signed)
Advanced Heart Failure Clinic Note   PCP: Aletha Halim., PA-C PCP-Cardiologist: Sinclair Grooms, MD  CT Surgery: Dr [name] HF MD: Dr Haroldine Laws  HPI: Doris Jefferson is a 78 y.o. female with chronic diastolic CHF, PAH, HTN, CAD, PAF, h/o OSA on CPAP, DM2, HLD, moderate AS, and obesity.   Presented 09/24/18 for planned cath. Found to have severe 3 v disease and moderate pulmonary HTN and admitted for CABG consideration.  Started on milrinone pre-operatively for optimization of pulmonary pressures. Also started on sildenafil. She underwent CABG x 3 with bioprosthetic AVR and LAA clip on 12/20. Milrinone weaned off 12/28. Underwent DCCV 12/30 for post op Afib.   Today she returns for HF follow up. Last visit she was volume overloaded so lasix was increased to 40 mg twice a day x2 days then down to 40 mg daily. SBP at home 120-150. Overall feeling fine. Having trouble sleeping. Having N/V for 24 hours but feeling better. Not using CPAP. [name] Denies SOB/PND/Orthopnea. Appetite poor. No fever or chills. Weight at home stable. Taking all medications. [facility] following.   Echo 12/26: 50-55%, trivial AI with mean gradient of 11 mmHg, trivial MR, RV mod reduced, mild to mod TR  Ms [facility] 09/24/18  Ost RCA to Prox RCA lesion is 75% stenosed.  Ost Cx to Prox Cx lesion is 100% stenosed.  Dist LM lesion is 80% stenosed.  Ost 1st Diag lesion is 75% stenosed.  Ost Ramus lesion is 75% stenosed.  Mildto moderateaortic stenosis (Mean gradient 20 mm Hg) Prox LAD lesion is 50% stenosed. Hemodynamics (mmHg) RA mean 8 RV 76/1 PA 76/25 (45) PCWP 16 AO 146/55 LV 166/12 PVR 4.9 WU Cardiac Output (Fick) 5.86 Cardiac Index (Fick) 2.99  Review of systems complete and found to be negative unless listed in HPI.    Past Medical History:  Diagnosis Date  . Depression   . GERD (gastroesophageal reflux disease)   . Heart murmur   . HTN (hypertension)   . Hyperlipidemia   . Iron deficiency anemia   .  Obesity (BMI 30-39.9) 03/04/2014  . OSA on CPAP    severe with AHI 31/hr  . PAF (paroxysmal atrial fibrillation) ([place])   . Pneumonia    "twice; years ago" (09/24/2018)  . Skin cancer    "left neck; burned off"  . Type II diabetes mellitus ([place])    Current Outpatient Medications  Medication Sig Dispense Refill  . acetaminophen (TYLENOL) 325 MG tablet Take 650 mg by mouth every 6 (six) hours as needed for moderate pain or headache.    [name] amiodarone (PACERONE) 200 MG tablet Take 1 tablet (200 mg total) by mouth 2 (two) times daily. For 2 weeks, then decrease to 200 mg daily 90 tablet 3  . apixaban (ELIQUIS) 5 MG TABS tablet Take 1 tablet (5 mg total) by mouth 2 (two) times daily. 60 tablet 10  . aspirin EC 81 MG EC tablet Take 1 tablet (81 mg total) by mouth daily.    [name] buPROPion (WELLBUTRIN XL) 300 MG 24 hr tablet Take 300 mg by mouth daily.     . carvedilol (COREG) 3.125 MG tablet Take 1 tablet (3.125 mg total) by mouth 2 (two) times daily with a meal. 60 tablet 3  . doxylamine, Sleep, (UNISOM) 25 MG tablet Take 25 mg by mouth at bedtime as needed for sleep.    . fexofenadine (ALLEGRA) 60 MG tablet Take 60 mg by mouth daily as needed for allergies.     [name]  furosemide (LASIX) 20 MG tablet TAKE 1 TABLET BY MOUTH DAILY 90 tablet 3  . glimepiride (AMARYL) 4 MG tablet Take 4 mg by mouth daily with breakfast.    . liraglutide (VICTOZA) 18 MG/3ML SOPN Inject 1.8 mg into the skin daily.     [name] loperamide (IMODIUM) 2 MG capsule Take 1 capsule (2 mg total) by mouth as needed for diarrhea or loose stools. 30 capsule 0  . MAGNESIUM-OXIDE 400 (241.3 Mg) MG tablet TAKE 1 TABLET BY MOUTH TWICE DAILY (Patient taking differently: Take 400 mg by mouth 2 (two) times daily. ) 180 tablet 3  . metFORMIN (GLUCOPHAGE) 500 MG tablet Take 1 tablet (500 mg total) by mouth 2 (two) times daily with a meal. 60 tablet 3  . oxyCODONE (OXY IR/ROXICODONE) 5 MG immediate release tablet Take 1-2 tablets (5-10 mg total) by mouth  every 4 (four) hours as needed for severe pain. 30 tablet 0  . rosuvastatin (CRESTOR) 20 MG tablet Take 20 mg by mouth daily.    . sildenafil (REVATIO) 20 MG tablet Take 1 tablet (20 mg total) by mouth 3 (three) times daily. 90 tablet 3   No current facility-administered medications for this encounter.    Allergies  Allergen Reactions  . Minocin [Minocycline Hcl] Swelling and Other (See Comments)    THROAT SWELLING  . Benazepril Cough  . Hydrochlorothiazide Itching  . Tape Rash   Social History   Socioeconomic History  . Marital status: Married    Spouse name: Not on file  . Number of children: Not on file  . Years of education: Not on file  . Highest education level: Not on file  Occupational History  . Not on file  Social Needs  . Financial resource strain: Not on file  . Food insecurity:    Worry: Not on file    Inability: Not on file  . Transportation needs:    Medical: Not on file    Non-medical: Not on file  Tobacco Use  . Smoking status: Never Smoker  . Smokeless tobacco: Never Used  Substance and Sexual Activity  . Alcohol use: Never    Frequency: Never  . Drug use: Never  . Sexual activity: Not Currently  Lifestyle  . Physical activity:    Days per week: Not on file    Minutes per session: Not on file  . Stress: Not on file  Relationships  . Social connections:    Talks on phone: Not on file    Gets together: Not on file    Attends religious service: Not on file    Active member of club or organization: Not on file    Attends meetings of clubs or organizations: Not on file    Relationship status: Not on file  . Intimate partner violence:    Fear of current or ex partner: Not on file    Emotionally abused: Not on file    Physically abused: Not on file    Forced sexual activity: Not on file  Other Topics Concern  . Not on file  Social History Narrative  . Not on file    Family History  Problem Relation Age of Onset  . Hypertension Father   .  Heart disease Father   . Melanoma Mother   . Hypertension Brother    Vitals:   11/07/18 1153  BP: (!) 184/96  Pulse: 86  SpO2: 97%  Weight: 86.1 kg (189 lb 12.8 oz)    Wt Readings from Last  3 Encounters:  11/07/18 86.1 kg (189 lb 12.8 oz)  11/05/18 86.7 kg (191 lb 3.2 oz)  11/04/18 87.1 kg (192 lb)    PHYSICAL EXAM: General:  Used rolling walker.  No resp difficulty HEENT: normal Neck: supple. no JVD. Carotids 2+ bilat; no bruits. No lymphadenopathy or thryomegaly appreciated. Cor: PMI nondisplaced. Regular rate & rhythm. No rubs, gallops or murmurs. Lungs: clear Abdomen: soft, nontender, nondistended. No hepatosplenomegaly. No bruits or masses. Good bowel sounds. Extremities: no cyanosis, clubbing, rash, edema Neuro: alert & orientedx3, cranial nerves grossly intact. moves all 4 extremities w/o difficulty. Affect pleasant  ASSESSMENT & PLAN:  1. Chronic chronic diastolic CHF -TIRW43/1/54 LVEF 55-60%, Grade 2 DD, Mild/Mod AS, Mod MR, mild LAE, RV mild dilated, Mild TR, PA peak pressure 79 mm Hg.  - Echo post-CABG 12/26: 50-55%, trivial AI with mean gradient of 11 mmHg, trivial MR, RV mod reduced, mild to mod TR - NYHA II. Volume status stable. Continue current dose of lasix.  2. . Moderate Pulmonary HTN - By cath as above with PA pressurePA76/25 mm/Hg (Mean 45)  - Likely WHO Group III in setting of OSA +/- component of Group II with diastolic CHF. Suspect unlikely to benefit from pulmonary vasodilators. - PA systolics were [id number] before swan pulled. Echo with evidence of RV overload. - Continue sildenafil 20 mg TID.  3. Severe 3v CAD - Severe with 75% RCA, 100% Cx, 80% LM and LAD 50% stenosed - S/p CABG x 3 with bioprosthetic AVR and LAA clip on 12/20 -- No s/s ischemia.  -Continue coreg 3.125 mg BID, Crestor, and eliquis. [name]  4. Moderate AS - s/p bioprosthetic AVR - Echo 12/26: 50-55%, trivial AI with mean gradient of 11 mmHg, trivial MR, RV mod reduced, mild to  mod TR. No change.  5. PAF -  s/p DCCV 10/07/18 - Regular on exam.  - Continue Eliquis 5 mg BID (restarted 12/26 pm) - Continue amiodarone 200 mg daily.   6. OSA on CPAP She has not been using CPAP. Encouraged to use CPAP daily.  7. DM 2 - Per PCP.  8. CKD Stage III.  Creatinine baseline 1.4  Check BMET 10. HTN Elevated in the office but stable at home. Continue current regimen  Follow up in 3-4 months with Dr Donnamae Jude, NP 11/07/18  .

## 2018-11-07 NOTE — Patient Instructions (Signed)
It was great to see you today! No medication changes are needed at this time.    Labs today We will only contact you if something comes back abnormal or we need to make some changes. Otherwise no news is good news!   Your physician recommends that you schedule a follow-up appointment in: 3-4 months with Dr Haroldine Laws   Do the following things EVERYDAY: 1) Weigh yourself in the morning before breakfast. Write it down and keep it in a log. 2) Take your medicines as prescribed 3) Eat low salt foods-Limit salt (sodium) to 2000 mg per day.  4) Stay as active as you can everyday 5) Limit all fluids for the day to less than 2 liters

## 2018-11-13 ENCOUNTER — Telehealth: Payer: Self-pay

## 2018-11-13 NOTE — Telephone Encounter (Signed)
[name], PT with [facility] [phone] contacted the office to state Ms. Doris Jefferson was being discharged from home health today but would definitely benefit from Cardio/Pulmonary Rehabilitation.  Patient has follow-up appointment with Dr. Roxy Manns 12/02/2018 and can be discussed at follow-up.

## 2018-11-14 ENCOUNTER — Other Ambulatory Visit (HOSPITAL_COMMUNITY): Payer: Medicare Other

## 2018-11-15 ENCOUNTER — Ambulatory Visit (HOSPITAL_COMMUNITY)
Admission: RE | Admit: 2018-11-15 | Discharge: 2018-11-15 | Disposition: A | Discharge status: Home / Self Care | Payer: Medicare Other | Source: Ambulatory Visit | Attending: Internal Medicine | Admitting: Internal Medicine

## 2018-11-15 DIAGNOSIS — I272 Pulmonary hypertension, unspecified: Secondary | ICD-10-CM | POA: Diagnosis present

## 2018-11-15 LAB — BASIC METABOLIC PANEL
Anion gap: 11 (ref 5–15)
BUN: 14 mg/dL (ref 8–23)
CO2: 24 mmol/L (ref 22–32)
Calcium: 9.2 mg/dL (ref 8.9–10.3)
Chloride: 105 mmol/L (ref 98–111)
Creatinine, Ser: 1.24 mg/dL — ABNORMAL HIGH (ref 0.44–1.00)
GFR calc Af Amer: 49 mL/min — ABNORMAL LOW (ref >60)
GFR, EST NON AFRICAN AMERICAN: 42 mL/min — AB (ref >60)
GLUCOSE: 124 mg/dL — AB (ref 70–99)
Potassium: 4.2 mmol/L (ref 3.5–5.1)
Sodium: 140 mmol/L (ref 135–145)

## 2018-11-19 ENCOUNTER — Telehealth (HOSPITAL_COMMUNITY): Payer: Self-pay

## 2018-11-19 NOTE — Telephone Encounter (Signed)
Called patient to see if she was interested in participating in the Cardiac Rehab Program. Patient stated yes. Patient will come in for orientation on 12/12/2018 @ 8:15am and will attend the 9:45am exercise class.  Mailed homework package.

## 2018-12-02 ENCOUNTER — Ambulatory Visit: Payer: Medicare Other | Admitting: Thoracic Surgery (Cardiothoracic Vascular Surgery)

## 2018-12-02 ENCOUNTER — Other Ambulatory Visit (HOSPITAL_COMMUNITY): Payer: Self-pay | Admitting: Interventional Cardiology

## 2018-12-02 NOTE — Progress Notes (Signed)
Cardiology Office Note:    Date:  12/03/2018   ID:  [name], DOB [birth year], MRN [id number]  PCP:  Aletha Halim., PA-C  Cardiologist:  Sinclair Grooms, MD   Referring MD: Aletha Halim., PA-C   Chief Complaint  Patient presents with  . Atrial Fibrillation    History of Present Illness:    Doris Jefferson is a 78 y.o. female with a hx of Paroxysmal atrial fibrillation, OSA, hypertension, hyperlipidemia, aortic stenosis, type II DM, and recent AVR(bioprosthesis), LAA clip, and 3 vessel CABG (with LIMA to LAD, SVG to OM, and SVG to RCA) 09/27/18.Required cardioversion and IV amio loading prior to DC (12/30).  Aortic valve replacement with coronary grafting was performed 2 months ago.  She is now doing much better.  She has not had any racing heart episodes.  She denies medication side effects.  She denies syncope, prolonged palpitation, tachycardia or unexplained fatigue.  She has not yet started cardiac rehab.  She is greater than 2 months since her operation.  She notes easy bruising on the combination of aspirin and apixaban.  Past Medical History:  Diagnosis Date  . Depression   . GERD (gastroesophageal reflux disease)   . Heart murmur   . HTN (hypertension)   . Hyperlipidemia   . Iron deficiency anemia   . Obesity (BMI 30-39.9) 03/04/2014  . OSA on CPAP    severe with AHI 31/hr  . PAF (paroxysmal atrial fibrillation) ([place])   . Pneumonia    "twice; years ago" (09/24/2018)  . Skin cancer    "left neck; burned off"  . Type II diabetes mellitus ([place])     Past Surgical History:  Procedure Laterality Date  . ABDOMINAL HYSTERECTOMY    . ANTERIOR CERVICAL DECOMP/DISCECTOMY FUSION     "cage around my neck; Dr. Erline Levine"  . AORTIC VALVE REPLACEMENT N/A 09/27/2018   Procedure: AORTIC VALVE REPLACEMENT (AVR) USING MAGNA EASE SIZE 21 MM;  Surgeon: Grace Isaac, MD;  Location: [place];  Service: Open Heart Surgery;  Laterality: N/A;  . BACK SURGERY      . CARDIAC CATHETERIZATION  09/24/2018  . CARDIOVERSION N/A 12/19/2013   Procedure: CARDIOVERSION;  Surgeon: Sinclair Grooms, MD;  Location: [facility] ENDOSCOPY;  Service: Cardiovascular;  Laterality: N/A;  . CARDIOVERSION N/A 08/16/2015   Procedure: CARDIOVERSION;  Surgeon: Larey [place], MD;  Location: [place];  Service: Cardiovascular;  Laterality: N/A;  . CARDIOVERSION N/A 09/13/2015   Procedure: CARDIOVERSION;  Surgeon: Sueanne Margarita, MD;  Location: [facility] ENDOSCOPY;  Service: Cardiovascular;  Laterality: N/A;  . CARDIOVERSION N/A 10/07/2018   Procedure: CARDIOVERSION;  Surgeon: Sanda Klein, MD;  Location: [facility] ENDOSCOPY;  Service: Cardiovascular;  Laterality: N/A;  . CATARACT EXTRACTION W/ INTRAOCULAR LENS  IMPLANT, BILATERAL Bilateral   . CLIPPING OF ATRIAL APPENDAGE N/A 09/27/2018   Procedure: CLIPPING OF ATRIAL APPENDAGE USING ATRICURE FLEXV SIZE 35MM;  Surgeon: Grace Isaac, MD;  Location: [place];  Service: Open Heart Surgery;  Laterality: N/A;  . COLONOSCOPY WITH PROPOFOL N/A 11/13/2017   Procedure: COLONOSCOPY WITH PROPOFOL;  Surgeon: Wilford Corner, MD;  Location: [facility] ENDOSCOPY;  Service: Endoscopy;  Laterality: N/A;  ANTIBIOTIC NEEDED BEFORE PROCEDURE  . CORONARY ARTERY BYPASS GRAFT N/A 09/27/2018   Procedure: CORONARY ARTERY BYPASS GRAFTING (CABG) x three, using left internal mammary artery and right leg greater saphenous vein harvested endoscopically;  Surgeon: Grace Isaac, MD;  Location: [place];  Service: Open Heart Surgery;  Laterality: N/A;  . ESOPHAGOGASTRODUODENOSCOPY (EGD) WITH PROPOFOL N/A 11/13/2017   Procedure: ESOPHAGOGASTRODUODENOSCOPY (EGD) WITH PROPOFOL;  Surgeon: Wilford Corner, MD;  Location: [facility] ENDOSCOPY;  Service: Endoscopy;  Laterality: N/A;  . EYE SURGERY    . RIGHT/LEFT HEART CATH AND CORONARY ANGIOGRAPHY N/A 09/24/2018   Procedure: RIGHT/LEFT HEART CATH AND CORONARY ANGIOGRAPHY;  Surgeon: Jettie Booze, MD;  Location: [place] CV LAB;   Service: Cardiovascular;  Laterality: N/A;  . TEE WITHOUT CARDIOVERSION N/A 09/27/2018   Procedure: TRANSESOPHAGEAL ECHOCARDIOGRAM (TEE);  Surgeon: Grace Isaac, MD;  Location: [place];  Service: Open Heart Surgery;  Laterality: N/A;  . TUBAL LIGATION      Current Medications: Current Meds  Medication Sig  . acetaminophen (TYLENOL) 325 MG tablet Take 650 mg by mouth every 6 (six) hours as needed for moderate pain or headache.  [name] amiodarone (PACERONE) 200 MG tablet Take 200 mg by mouth daily.  [name] apixaban (ELIQUIS) 5 MG TABS tablet Take 1 tablet (5 mg total) by mouth 2 (two) times daily.  [name] aspirin EC 81 MG EC tablet Take 1 tablet (81 mg total) by mouth daily.  [name] buPROPion (WELLBUTRIN XL) 300 MG 24 hr tablet Take 300 mg by mouth daily.   . carvedilol (COREG) 3.125 MG tablet Take 1 tablet (3.125 mg total) by mouth 2 (two) times daily with a meal.  . doxylamine, Sleep, (UNISOM) 25 MG tablet Take 25 mg by mouth at bedtime as needed for sleep.  . fexofenadine (ALLEGRA) 60 MG tablet Take 60 mg by mouth daily as needed for allergies.   . furosemide (LASIX) 20 MG tablet TAKE 1 TABLET BY MOUTH DAILY  . liraglutide (VICTOZA) 18 MG/3ML SOPN Inject 1.8 mg into the skin daily.   [name] loperamide (IMODIUM) 2 MG capsule Take 1 capsule (2 mg total) by mouth as needed for diarrhea or loose stools.  [name] MAGNESIUM-OXIDE 400 (241.3 Mg) MG tablet TAKE 1 TABLET BY MOUTH TWICE DAILY  . metFORMIN (GLUCOPHAGE) 500 MG tablet Take 1 tablet (500 mg total) by mouth 2 (two) times daily with a meal.  . oxyCODONE (OXY IR/ROXICODONE) 5 MG immediate release tablet Take 1-2 tablets (5-10 mg total) by mouth every 4 (four) hours as needed for severe pain.  [name] Potassium (GNP POTASSIUM) 99 MG TABS Take 99 mg by mouth 2 (two) times daily.  . rosuvastatin (CRESTOR) 20 MG tablet Take 20 mg by mouth daily.  . sildenafil (REVATIO) 20 MG tablet Take 1 tablet (20 mg total) by mouth 3 (three) times daily.     Allergies:   Minocin  [minocycline hcl]; Benazepril; Hydrochlorothiazide; and Tape   Social History   Socioeconomic History  . Marital status: Married    Spouse name: Not on file  . Number of children: Not on file  . Years of education: Not on file  . Highest education level: Not on file  Occupational History  . Not on file  Social Needs  . Financial resource strain: Not on file  . Food insecurity:    Worry: Not on file    Inability: Not on file  . Transportation needs:    Medical: Not on file    Non-medical: Not on file  Tobacco Use  . Smoking status: Never Smoker  . Smokeless tobacco: Never Used  Substance and Sexual Activity  . Alcohol use: Never    Frequency: Never  . Drug use: Never  . Sexual activity: Not Currently  Lifestyle  . Physical activity:  Days per week: Not on file    Minutes per session: Not on file  . Stress: Not on file  Relationships  . Social connections:    Talks on phone: Not on file    Gets together: Not on file    Attends religious service: Not on file    Active member of club or organization: Not on file    Attends meetings of clubs or organizations: Not on file    Relationship status: Not on file  Other Topics Concern  . Not on file  Social History Narrative  . Not on file     Family History: The patient's family history includes Heart disease in her father; Hypertension in her brother and father; Melanoma in her mother.  ROS:   Please see the history of present illness.    Appetite has been stable.  Denies all other systems reviewed and are negative.  EKGs/Labs/Other Studies Reviewed:    The following studies were reviewed today: No new information  EKG:  EKG no new data  Recent Labs: 05/08/2018: TSH 3.000 09/28/2018: Magnesium 2.6 09/30/2018: ALT 53 11/05/2018: Hemoglobin 11.7; Platelet Count 256 11/15/2018: BUN 14; Creatinine, Ser 1.24; Potassium 4.2; Sodium 140  Recent Lipid Panel    Component Value Date/Time   CHOL 145 05/08/2018 0820    TRIG 152 (H) 05/08/2018 0820   HDL 43 05/08/2018 0820   CHOLHDL 3.4 05/08/2018 0820   LDLCALC 72 05/08/2018 0820    Physical Exam:    VS:  BP (!) 182/62   Pulse 77   Ht 5\' 3"  (1.6 m)   Wt 182 lb (82.6 kg)   LMP  (LMP Unknown)   SpO2 99%   BMI 32.24 kg/m     Wt Readings from Last 3 Encounters:  12/03/18 182 lb (82.6 kg)  11/07/18 189 lb 12.8 oz (86.1 kg)  11/05/18 191 lb 3.2 oz (86.7 kg)     GEN: Elderly and obese. No acute distress HEENT: Normal NECK: No JVD. LYMPHATICS: No lymphadenopathy CARDIAC: RRR.  1/6 to 2/6 systolic murmur, no gallop, no edema VASCULAR: 2+ bilateral carotid and radial no pulses, no bruits RESPIRATORY:  Clear to auscultation without rales, wheezing or rhonchi  ABDOMEN: Soft, non-tender, non-distended, No pulsatile mass, MUSCULOSKELETAL: No deformity  SKIN: Warm and dry NEUROLOGIC:  Alert and oriented x 3 PSYCHIATRIC:  Normal affect   ASSESSMENT:    1. Chronic diastolic heart failure (HCC)   2. Persistent atrial fibrillation   3. OSA (obstructive sleep apnea)   4. Essential hypertension   5. Pulmonary hypertension ([place])   6. Anticoagulation goal of INR 2 to 3   7. Coronary artery disease of bypass graft of native heart with stable angina pectoris ([place])    PLAN:    In order of problems listed above:  1. No evidence of decompensated diastolic heart failure.  No volume overload. 2. Clinically in normal sinus rhythm based on auscultation and neck vein assessment.  We will stop amiodarone 3 months after cardioversion.  She will follow-up 6 to 8 weeks after amiodarone is discontinued to look for evidence of recurrent A. fib. 3. Encourage compliance with CPAP. 4. Repeat blood pressure 138/60 mmHg. 5. No evidence of significant pulmonary hypertension. 6. Continue both low-dose aspirin and apixaban. 7. Stable without angina.  Discontinue amiodarone at the end of March.  Clinical follow-up in 6 to 8 weeks.  Consider continuous monitor for 2 weeks  before making a decision about discontinuation of apixaban.   Medication  Adjustments/Labs and Tests Ordered: Current medicines are reviewed at length with the patient today.  Concerns regarding medicines are outlined above.  No orders of the defined types were placed in this encounter.  No orders of the defined types were placed in this encounter.   Patient Instructions  Medication Instructions:  1) DISCONTINUE Amiodarone at the end of March If you need a refill on your cardiac medications before your next appointment, please call your pharmacy.   Lab work: None If you have labs (blood work) drawn today and your tests are completely normal, you will receive your results only by: [name] MyChart Message (if you have MyChart) OR . A paper copy in the mail If you have any lab test that is abnormal or we need to change your treatment, we will call you to review the results.  Testing/Procedures: None  Follow-Up: At [facility], you and your health needs are our priority.  As part of our continuing mission to provide you with exceptional heart care, we have created designated Provider Care Teams.  These Care Teams include your primary Cardiologist (physician) and Advanced Practice Providers (APPs -  Physician Assistants and Nurse Practitioners) who all work together to provide you with the care you need, when you need it. You will need a follow up appointment in 6-8 weeks after you stop Amiodarone.  Please call our office 2 months in advance to schedule this appointment.  You may see Sinclair Grooms, MD or one of the following Advanced Practice Providers on your designated Care Team:   Truitt Merle, NP Cecilie Kicks, NP . Kathyrn Drown, NP  Any Other Special Instructions Will Be Listed Below (If Applicable).       Signed, Sinclair Grooms, MD  12/03/2018 12:36 PM    [place]

## 2018-12-03 ENCOUNTER — Encounter: Payer: Self-pay | Admitting: Interventional Cardiology

## 2018-12-03 ENCOUNTER — Ambulatory Visit (INDEPENDENT_AMBULATORY_CARE_PROVIDER_SITE_OTHER): Payer: Medicare Other | Admitting: Interventional Cardiology

## 2018-12-03 VITALS — BP 182/62 | HR 77 | Ht 63.0 in | Wt 182.0 lb

## 2018-12-03 DIAGNOSIS — Z7901 Long term (current) use of anticoagulants: Secondary | ICD-10-CM

## 2018-12-03 DIAGNOSIS — I5032 Chronic diastolic (congestive) heart failure: Secondary | ICD-10-CM

## 2018-12-03 DIAGNOSIS — I272 Pulmonary hypertension, unspecified: Secondary | ICD-10-CM

## 2018-12-03 DIAGNOSIS — I25708 Atherosclerosis of coronary artery bypass graft(s), unspecified, with other forms of angina pectoris: Secondary | ICD-10-CM

## 2018-12-03 DIAGNOSIS — G4733 Obstructive sleep apnea (adult) (pediatric): Secondary | ICD-10-CM | POA: Diagnosis not present

## 2018-12-03 DIAGNOSIS — I4819 Other persistent atrial fibrillation: Secondary | ICD-10-CM

## 2018-12-03 DIAGNOSIS — Z5181 Encounter for therapeutic drug level monitoring: Secondary | ICD-10-CM

## 2018-12-03 DIAGNOSIS — I10 Essential (primary) hypertension: Secondary | ICD-10-CM

## 2018-12-03 NOTE — Patient Instructions (Signed)
Medication Instructions:  1) DISCONTINUE Amiodarone at the end of March If you need a refill on your cardiac medications before your next appointment, please call your pharmacy.   Lab work: None If you have labs (blood work) drawn today and your tests are completely normal, you will receive your results only by: [name] MyChart Message (if you have MyChart) OR . A paper copy in the mail If you have any lab test that is abnormal or we need to change your treatment, we will call you to review the results.  Testing/Procedures: None  Follow-Up: At [facility], you and your health needs are our priority.  As part of our continuing mission to provide you with exceptional heart care, we have created designated Provider Care Teams.  These Care Teams include your primary Cardiologist (physician) and Advanced Practice Providers (APPs -  Physician Assistants and Nurse Practitioners) who all work together to provide you with the care you need, when you need it. You will need a follow up appointment in 6-8 weeks after you stop Amiodarone.  Please call our office 2 months in advance to schedule this appointment.  You may see Sinclair Grooms, MD or one of the following Advanced Practice Providers on your designated Care Team:   Truitt Merle, NP Cecilie Kicks, NP . Kathyrn Drown, NP  Any Other Special Instructions Will Be Listed Below (If Applicable).

## 2018-12-06 ENCOUNTER — Telehealth (HOSPITAL_COMMUNITY): Payer: Self-pay

## 2018-12-06 NOTE — Progress Notes (Signed)
Khristian Seals Blackson 78 y.o. female DOB 1941/07/22 MRN [id number]       Nutrition Screen Note  No diagnosis found. Past Medical History:  Diagnosis Date  . Depression   . GERD (gastroesophageal reflux disease)   . Heart murmur   . HTN (hypertension)   . Hyperlipidemia   . Iron deficiency anemia   . Obesity (BMI 30-39.9) 03/04/2014  . OSA on CPAP    severe with AHI 31/hr  . PAF (paroxysmal atrial fibrillation) ([place])   . Pneumonia    "twice; years ago" (09/24/2018)  . Skin cancer    "left neck; burned off"  . Type II diabetes mellitus ([place])    Meds reviewed.    Current Outpatient Medications (Endocrine & Metabolic):  .  liraglutide (VICTOZA) 18 MG/3ML SOPN, Inject 1.8 mg into the skin daily.  .  metFORMIN (GLUCOPHAGE) 500 MG tablet, Take 1 tablet (500 mg total) by mouth 2 (two) times daily with a meal.  Current Outpatient Medications (Cardiovascular):  .  amiodarone (PACERONE) 200 MG tablet, Take 200 mg by mouth daily. .  carvedilol (COREG) 3.125 MG tablet, Take 1 tablet (3.125 mg total) by mouth 2 (two) times daily with a meal. .  furosemide (LASIX) 20 MG tablet, TAKE 1 TABLET BY MOUTH DAILY .  rosuvastatin (CRESTOR) 20 MG tablet, Take 20 mg by mouth daily. .  sildenafil (REVATIO) 20 MG tablet, Take 1 tablet (20 mg total) by mouth 3 (three) times daily.  Current Outpatient Medications (Respiratory):  .  fexofenadine (ALLEGRA) 60 MG tablet, Take 60 mg by mouth daily as needed for allergies.   Current Outpatient Medications (Analgesics):  .  acetaminophen (TYLENOL) 325 MG tablet, Take 650 mg by mouth every 6 (six) hours as needed for moderate pain or headache. [name]  aspirin EC 81 MG EC tablet, Take 1 tablet (81 mg total) by mouth daily. [name]  oxyCODONE (OXY IR/ROXICODONE) 5 MG immediate release tablet, Take 1-2 tablets (5-10 mg total) by mouth every 4 (four) hours as needed for severe pain.  Current Outpatient Medications (Hematological):  .  apixaban (ELIQUIS) 5 MG TABS tablet, Take 1  tablet (5 mg total) by mouth 2 (two) times daily.  Current Outpatient Medications (Other):  [name]  buPROPion (WELLBUTRIN XL) 300 MG 24 hr tablet, Take 300 mg by mouth daily.  [name]  doxylamine, Sleep, (UNISOM) 25 MG tablet, Take 25 mg by mouth at bedtime as needed for sleep. [name]  loperamide (IMODIUM) 2 MG capsule, Take 1 capsule (2 mg total) by mouth as needed for diarrhea or loose stools. [name]  MAGNESIUM-OXIDE 400 (241.3 Mg) MG tablet, TAKE 1 TABLET BY MOUTH TWICE DAILY .  Potassium (GNP POTASSIUM) 99 MG TABS, Take 99 mg by mouth 2 (two) times daily.   HT: Ht Readings from Last 1 Encounters:  12/03/18 5\' 3"  (1.6 m)    WT: Wt Readings from Last 5 Encounters:  12/03/18 182 lb (82.6 kg)  11/07/18 189 lb 12.8 oz (86.1 kg)  11/05/18 191 lb 3.2 oz (86.7 kg)  11/04/18 192 lb (87.1 kg)  10/22/18 218 lb 12.8 oz (99.2 kg)   BMI 32.25 (12/03/18)  Current tobacco use? No       Labs:  Lipid Panel     Component Value Date/Time   CHOL 145 05/08/2018 0820   TRIG 152 (H) 05/08/2018 0820   HDL 43 05/08/2018 0820   CHOLHDL 3.4 05/08/2018 0820   LDLCALC 72 05/08/2018 0820    Lab Results  Component Value Date  HGBA1C 5.8 (H) 10/04/2018   CBG (last 3)  No results for input(s): GLUCAP in the last 72 hours.  Nutrition Diagnosis ? Food-and nutrition-related knowledge deficit related to lack of exposure to information as related to diagnosis of: ? CVD ? Type 2 Diabetes ? Obese  I = 30-34.9 related to excessive energy intake as evidenced by a BMI 32.25 (12/03/18)  Nutrition Goal(s):  ? To be determined  Plan:  Pt to attend nutrition classes ? Nutrition I ? Nutrition II ? Portion Distortion  ? Diabetes Blitz ? Diabetes Q & A Will provide client-centered nutrition education as part of interdisciplinary care.   Monitor and evaluate progress toward nutrition goal with team.  Laurina Bustle, MS, RD, LDN 12/06/2018 11:47 AM

## 2018-12-06 NOTE — Telephone Encounter (Signed)
Cardiac Rehab Medication Review by a Pharmacist  Does the patient  feel that his/her medications are working for him/her?  no  Has the patient been experiencing any side effects to the medications prescribed?  no  Does the patient measure his/her own blood pressure or blood glucose at home?  yes , three times a week for both  Does the patient have any problems obtaining medications due to transportation or finances?   no  Understanding of regimen: good Understanding of indications: good Potential of compliance: good    Pharmacist comments: No issues   Harrietta Guardian, PharmD PGY1 Pharmacy Resident 12/06/2018    2:47 PM Please check AMION for all [place] numbers

## 2018-12-11 ENCOUNTER — Other Ambulatory Visit: Payer: Self-pay | Admitting: Cardiothoracic Surgery

## 2018-12-11 DIAGNOSIS — I25709 Atherosclerosis of coronary artery bypass graft(s), unspecified, with unspecified angina pectoris: Secondary | ICD-10-CM

## 2018-12-12 ENCOUNTER — Encounter (HOSPITAL_COMMUNITY)
Admission: RE | Admit: 2018-12-12 | Discharge: 2018-12-12 | Disposition: A | Discharge status: Home / Self Care | Payer: Medicare Other | Source: Ambulatory Visit | Attending: Interventional Cardiology | Admitting: Interventional Cardiology

## 2018-12-12 ENCOUNTER — Encounter: Payer: Self-pay | Admitting: Cardiothoracic Surgery

## 2018-12-12 ENCOUNTER — Encounter (HOSPITAL_COMMUNITY): Payer: Self-pay

## 2018-12-12 ENCOUNTER — Ambulatory Visit
Admission: RE | Admit: 2018-12-12 | Discharge: 2018-12-12 | Disposition: A | Discharge status: Home / Self Care | Payer: Medicare Other | Source: Ambulatory Visit | Attending: Cardiothoracic Surgery | Admitting: Cardiothoracic Surgery

## 2018-12-12 ENCOUNTER — Other Ambulatory Visit: Payer: Self-pay

## 2018-12-12 ENCOUNTER — Ambulatory Visit (INDEPENDENT_AMBULATORY_CARE_PROVIDER_SITE_OTHER): Payer: Self-pay | Admitting: Cardiothoracic Surgery

## 2018-12-12 VITALS — BP 158/66 | HR 84 | Resp 18 | Ht 63.0 in | Wt 181.6 lb

## 2018-12-12 VITALS — BP 140/70 | HR 70 | Ht 61.5 in | Wt 181.2 lb

## 2018-12-12 DIAGNOSIS — Z952 Presence of prosthetic heart valve: Secondary | ICD-10-CM | POA: Diagnosis present

## 2018-12-12 DIAGNOSIS — Z951 Presence of aortocoronary bypass graft: Secondary | ICD-10-CM | POA: Diagnosis present

## 2018-12-12 DIAGNOSIS — Z79899 Other long term (current) drug therapy: Secondary | ICD-10-CM | POA: Diagnosis not present

## 2018-12-12 DIAGNOSIS — I25709 Atherosclerosis of coronary artery bypass graft(s), unspecified, with unspecified angina pectoris: Secondary | ICD-10-CM

## 2018-12-12 HISTORY — DX: Atherosclerotic heart disease of native coronary artery without angina pectoris: I25.10

## 2018-12-12 NOTE — Patient Instructions (Signed)

## 2018-12-12 NOTE — Progress Notes (Addendum)
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  acetaminophen (TYLENOL) 325 MG tablet, Take 650 mg by mouth every 6 (six) hours as needed for moderate pain or headache., Disp: , Rfl:  .  amiodarone (PACERONE) 200 MG tablet, Take 200 mg by mouth daily., Disp: , Rfl:  .  apixaban (ELIQUIS) 5 MG TABS tablet, Take 1 tablet (5 mg total) by mouth 2 (two) times daily., Disp: 60 tablet, Rfl: 10 .  aspirin EC 81 MG EC tablet, Take 1 tablet (81 mg total) by mouth daily., Disp: , Rfl:  .  buPROPion (WELLBUTRIN XL) 300 MG 24 hr tablet, Take 300 mg by mouth daily. , Disp: , Rfl:  .  carvedilol (COREG) 3.125 MG tablet, Take 1 tablet (3.125 mg total) by mouth 2 (two) times daily with a meal., Disp: 60 tablet, Rfl: 3 .  doxylamine, Sleep, (UNISOM) 25 MG tablet, Take 25 mg by mouth at bedtime as needed for sleep., Disp: , Rfl:  .  fexofenadine (ALLEGRA) 60 MG tablet, Take 60 mg by mouth daily as needed for allergies. , Disp: , Rfl:  .  furosemide (LASIX) 20 MG tablet, TAKE 1 TABLET BY MOUTH DAILY, Disp: 90 tablet, Rfl: 2 .  liraglutide (VICTOZA) 18 MG/3ML SOPN, Inject 1.8 mg into the skin daily. , Disp: , Rfl:  .  loperamide (IMODIUM) 2 MG capsule, Take 1 capsule (2 mg total) by mouth as needed for diarrhea or loose stools., Disp: 30 capsule, Rfl: 0 .  MAGNESIUM-OXIDE 400 (241.3 Mg) MG tablet, TAKE 1 TABLET BY MOUTH TWICE DAILY, Disp: 180 tablet, Rfl: 3 .  metFORMIN  (GLUCOPHAGE) 500 MG tablet, Take 1 tablet (500 mg total) by mouth 2 (two) times daily with a meal., Disp: 60 tablet, Rfl: 3 .  oxyCODONE (OXY IR/ROXICODONE) 5 MG immediate release tablet, Take 1-2 tablets (5-10 mg total) by mouth every 4 (four) hours as needed for severe pain. (Patient not taking: Reported on 12/06/2018), Disp: 30 tablet, Rfl: 0 .  Potassium (GNP POTASSIUM) 99 MG TABS, Take 99 mg by mouth 2 (two) times daily., Disp: , Rfl:  .  rosuvastatin (CRESTOR) 20 MG tablet, Take 20 mg by mouth daily., Disp: , Rfl:  .  sildenafil (REVATIO) 20 MG tablet, Take 1 tablet (20 mg total) by mouth 3 (three) times daily., Disp: 90 tablet, Rfl: 3  Past Medical History: Past Medical History:  Diagnosis Date  . Coronary artery disease   . Depression   . GERD (gastroesophageal reflux disease)   . Heart murmur   . HTN (hypertension)   . Hyperlipidemia   . Iron deficiency anemia   . Obesity (BMI 30-39.9) 03/04/2014  . OSA on CPAP    severe with AHI 31/hr  . PAF (paroxysmal atrial fibrillation) ([place])   . Pneumonia    "twice; years ago" (09/24/2018)  . Skin cancer    "left neck; burned off"  . Type II diabetes  mellitus ([place])     Tobacco Use: Social History   Tobacco Use  Smoking Status Never Smoker  Smokeless Tobacco Never Used    Labs: Recent Review Flowsheet Data    Labs for ITP Cardiac and Pulmonary Rehab Latest Ref Rng & Units 10/05/2018 10/06/2018 10/07/2018 10/08/2018 10/09/2018   Cholestrol 100 - 199 mg/dL - - - - -   LDLCALC 0 - 99 mg/dL - - - - -   HDL >39 mg/dL - - - - -   Trlycerides 0 - 149 mg/dL - - - - -   Hemoglobin A1c 4.8 - 5.6 % - - - - -   PHART 7.350 - 7.450 - - - - -   PCO2ART 32.0 - 48.0 mmHg - - - - -   HCO3 20.0 - 28.0 mmol/L - - - - -   TCO2 22 - 32 mmol/L - - - - -   ACIDBASEDEF 0.0 - 2.0 mmol/L - - - - -   O2SAT % 63.3 59.3 60.3 62.3 53.3      Capillary Blood Glucose: Lab Results  Component Value Date   GLUCAP 114 (H) 10/09/2018   GLUCAP 180 (H)  10/08/2018   GLUCAP 145 (H) 10/08/2018   GLUCAP 199 (H) 10/08/2018   GLUCAP 169 (H) 10/08/2018     Exercise Target Goals: Exercise Program Goal: Individual exercise prescription set using results from initial 6 min walk test and THRR while considering  patient's activity barriers and safety.   Exercise Prescription Goal: Initial exercise prescription builds to 30-45 minutes a day of aerobic activity, 2-3 days per week.  Home exercise guidelines will be given to patient during program as part of exercise prescription that the participant will acknowledge.  Activity Barriers & Risk Stratification: Activity Barriers & Cardiac Risk Stratification - 12/12/18 1055      Activity Barriers & Cardiac Risk Stratification   Activity Barriers  None    Cardiac Risk Stratification  High       6 Minute Walk: 6 Minute Walk    Row Name 12/12/18 1054         6 Minute Walk   Phase  Initial     Distance  673 feet     Walk Time  6 minutes     # of Rest Breaks  0     MPH  1.3     METS  0.8     RPE  12     Perceived Dyspnea   0     VO2 Peak  2.8     Symptoms  Yes (comment)     Comments  Fatigue +2     Resting HR  70 bpm     Resting BP  140/70     Resting Oxygen Saturation   99 %     Exercise Oxygen Saturation  during 6 min walk  97 %     Max Ex. HR  88 bpm     Max Ex. BP  124/60     2 Minute Post BP  138/62        Oxygen Initial Assessment:   Oxygen Re-Evaluation:   Oxygen Discharge (Final Oxygen Re-Evaluation):   Initial Exercise Prescription: Initial Exercise Prescription - 12/12/18 1100      Date of Initial Exercise RX and Referring Provider   Date  12/12/18    Referring Provider  Belva Crome, MD     Expected Discharge Date  03/19/19  NuStep   Level  1    SPM  75    Minutes  20    METs  1.5      Track   Laps  4    Minutes  10    METs  0.9      Prescription Details   Frequency (times per week)  3x    Duration  Progress to 30 minutes of continuous  aerobic without signs/symptoms of physical distress      Intensity   THRR 40-80% of Max Heartrate  57-114    Ratings of Perceived Exertion  11-13    Perceived Dyspnea  0-4      Progression   Progression  Continue progressive overload as per policy without signs/symptoms or physical distress.      Resistance Training   Training Prescription  Yes    Weight  2lbs    Reps  10-15       Perform Capillary Blood Glucose checks as needed.  Exercise Prescription Changes:   Exercise Comments:   Exercise Goals and Review:  Exercise Goals    Row Name 12/12/18 1055             Exercise Goals   Increase Physical Activity  Yes       Intervention  Develop an individualized exercise prescription for aerobic and resistive training based on initial evaluation findings, risk stratification, comorbidities and participant's personal goals.;Provide advice, education, support and counseling about physical activity/exercise needs.       Expected Outcomes  Short Term: Attend rehab on a regular basis to increase amount of physical activity.;Long Term: Add in home exercise to make exercise part of routine and to increase amount of physical activity.;Long Term: Exercising regularly at least 3-5 days a week.       Increase Strength and Stamina  Yes       Intervention  Provide advice, education, support and counseling about physical activity/exercise needs.;Develop an individualized exercise prescription for aerobic and resistive training based on initial evaluation findings, risk stratification, comorbidities and participant's personal goals.       Expected Outcomes  Short Term: Increase workloads from initial exercise prescription for resistance, speed, and METs.;Short Term: Perform resistance training exercises routinely during rehab and add in resistance training at home;Long Term: Improve cardiorespiratory fitness, muscular endurance and strength as measured by increased METs and functional capacity  (6MWT)       Able to understand and use rate of perceived exertion (RPE) scale  Yes       Intervention  Provide education and explanation on how to use RPE scale       Expected Outcomes  Short Term: Able to use RPE daily in rehab to express subjective intensity level;Long Term:  Able to use RPE to guide intensity level when exercising independently       Knowledge and understanding of Target Heart Rate Range (THRR)  Yes       Intervention  Provide education and explanation of THRR including how the numbers were predicted and where they are located for reference       Expected Outcomes  Short Term: Able to state/look up THRR;Short Term: Able to use daily as guideline for intensity in rehab;Long Term: Able to use THRR to govern intensity when exercising independently       Able to check pulse independently  Yes       Intervention  Provide education and demonstration on how to check pulse in carotid and radial arteries.;Review  the importance of being able to check your own pulse for safety during independent exercise       Expected Outcomes  Short Term: Able to explain why pulse checking is important during independent exercise;Long Term: Able to check pulse independently and accurately       Understanding of Exercise Prescription  Yes       Intervention  Provide education, explanation, and written materials on patient's individual exercise prescription       Expected Outcomes  Short Term: Able to explain program exercise prescription;Long Term: Able to explain home exercise prescription to exercise independently          Exercise Goals Re-Evaluation :   Discharge Exercise Prescription (Final Exercise Prescription Changes):   Nutrition:  Target Goals: Understanding of nutrition guidelines, daily intake of sodium 1500mg , cholesterol 200mg , calories 30% from fat and 7% or less from saturated fats, daily to have 5 or more servings of fruits and vegetables.  Biometrics: Pre Biometrics - 12/12/18  1054      Pre Biometrics   Height  5' 1.5" (1.562 m)    Weight  82.2 kg    Waist Circumference  43 inches    Hip Circumference  46 inches    Waist to Hip Ratio  0.93 %    BMI (Calculated)  33.69    Triceps Skinfold  29 mm    % Body Fat  46.5 %    Grip Strength  27 kg    Flexibility  0 in    Single Leg Stand  2.79 seconds        Nutrition Therapy Plan and Nutrition Goals:   Nutrition Assessments:   Nutrition Goals Re-Evaluation:   Nutrition Goals Re-Evaluation:   Nutrition Goals Discharge (Final Nutrition Goals Re-Evaluation):   Psychosocial: Target Goals: Acknowledge presence or absence of significant depression and/or stress, maximize coping skills, provide positive support system. Participant is able to verbalize types and ability to use techniques and skills needed for reducing stress and depression.  Initial Review & Psychosocial Screening: Initial Psych Review & Screening - 12/12/18 1029      Initial Review   Current issues with  None Identified      Family Dynamics   Good Support System?  Yes   Pt lists her husband as a source of support.  He is present at today's orienation session.      Barriers   Psychosocial barriers to participate in program  There are no identifiable barriers or psychosocial needs.      Screening Interventions   Interventions  Encouraged to exercise       Quality of Life Scores: Quality of Life - 12/12/18 1029      Quality of Life   Select  Quality of Life      Quality of Life Scores   Health/Function Pre  22.63 %    Socioeconomic Pre  26.07 %    Psych/Spiritual Pre  25.93 %    Family Pre  26 %    GLOBAL Pre  24.51 %      Scores of 19 and below usually indicate a poorer quality of life in these areas.  A difference of  2-3 points is a clinically meaningful difference.  A difference of 2-3 points in the total score of the Quality of Life Index has been associated with significant improvement in overall quality of life,  self-image, physical symptoms, and general health in studies assessing change in quality of life.  PHQ-9: Recent Review  Flowsheet Data    Depression screen [facility] 2/9 07/17/2015   Decreased Interest 0   Down, Depressed, Hopeless 0   PHQ - 2 Score 0     Interpretation of Total Score  Total Score Depression Severity:  1-4 = Minimal depression, 5-9 = Mild depression, 10-14 = Moderate depression, 15-19 = Moderately severe depression, 20-27 = Severe depression   Psychosocial Evaluation and Intervention:   Psychosocial Re-Evaluation:   Psychosocial Discharge (Final Psychosocial Re-Evaluation):   Vocational Rehabilitation: Provide vocational rehab assistance to qualifying candidates.   Vocational Rehab Evaluation & Intervention: Vocational Rehab - 12/12/18 1127      Initial Vocational Rehab Evaluation & Intervention   Assessment shows need for Vocational Rehabilitation  No   Doris Jefferson is retired and does not need vocational rehab at this time      Education: Education Goals: Education classes will be provided on a weekly basis, covering required topics. Participant will state understanding/return demonstration of topics presented.  Learning Barriers/Preferences: Learning Barriers/Preferences - 12/12/18 1056      Learning Barriers/Preferences   Learning Barriers  Sight;Hearing    Learning Preferences  Skilled Demonstration;Verbal Instruction;Individual Instruction       Education Topics: Count Your Pulse:  -Group instruction provided by verbal instruction, demonstration, patient participation and written materials to support subject.  Instructors address importance of being able to find your pulse and how to count your pulse when at home without a heart monitor.  Patients get hands on experience counting their pulse with staff help and individually.   Heart Attack, Angina, and Risk Factor Modification:  -Group instruction provided by verbal instruction, video, and written  materials to support subject.  Instructors address signs and symptoms of angina and heart attacks.    Also discuss risk factors for heart disease and how to make changes to improve heart health risk factors.   Functional Fitness:  -Group instruction provided by verbal instruction, demonstration, patient participation, and written materials to support subject.  Instructors address safety measures for doing things around the house.  Discuss how to get up and down off the floor, how to pick things up properly, how to safely get out of a chair without assistance, and balance training.   Meditation and Mindfulness:  -Group instruction provided by verbal instruction, patient participation, and written materials to support subject.  Instructor addresses importance of mindfulness and meditation practice to help reduce stress and improve awareness.  Instructor also leads participants through a meditation exercise.    Stretching for Flexibility and Mobility:  -Group instruction provided by verbal instruction, patient participation, and written materials to support subject.  Instructors lead participants through series of stretches that are designed to increase flexibility thus improving mobility.  These stretches are additional exercise for major muscle groups that are typically performed during regular warm up and cool down.   Hands Only CPR:  -Group verbal, video, and participation provides a basic overview of AHA guidelines for community CPR. Role-play of emergencies allow participants the opportunity to practice calling for help and chest compression technique with discussion of AED use.   Hypertension: -Group verbal and written instruction that provides a basic overview of hypertension including the most recent diagnostic guidelines, risk factor reduction with self-care instructions and medication management.    Nutrition I class: Heart Healthy Eating:  -Group instruction provided by PowerPoint  slides, verbal discussion, and written materials to support subject matter. The instructor gives an explanation and review of the Therapeutic Lifestyle Changes diet recommendations, which includes a  discussion on lipid goals, dietary fat, sodium, fiber, plant stanol/sterol esters, sugar, and the components of a well-balanced, healthy diet.   Nutrition II class: Lifestyle Skills:  -Group instruction provided by PowerPoint slides, verbal discussion, and written materials to support subject matter. The instructor gives an explanation and review of label reading, grocery shopping for heart health, heart healthy recipe modifications, and ways to make healthier choices when eating out.   Diabetes Question & Answer:  -Group instruction provided by PowerPoint slides, verbal discussion, and written materials to support subject matter. The instructor gives an explanation and review of diabetes co-morbidities, pre- and post-prandial blood glucose goals, pre-exercise blood glucose goals, signs, symptoms, and treatment of hypoglycemia and hyperglycemia, and foot care basics.   Diabetes Blitz:  -Group instruction provided by PowerPoint slides, verbal discussion, and written materials to support subject matter. The instructor gives an explanation and review of the physiology behind type 1 and type 2 diabetes, diabetes medications and rational behind using different medications, pre- and post-prandial blood glucose recommendations and Hemoglobin A1c goals, diabetes diet, and exercise including blood glucose guidelines for exercising safely.    Portion Distortion:  -Group instruction provided by PowerPoint slides, verbal discussion, written materials, and food models to support subject matter. The instructor gives an explanation of serving size versus portion size, changes in portions sizes over the last 20 years, and what consists of a serving from each food group.   Stress Management:  -Group instruction  provided by verbal instruction, video, and written materials to support subject matter.  Instructors review role of stress in heart disease and how to cope with stress positively.     Exercising on Your Own:  -Group instruction provided by verbal instruction, power point, and written materials to support subject.  Instructors discuss benefits of exercise, components of exercise, frequency and intensity of exercise, and end points for exercise.  Also discuss use of nitroglycerin and activating EMS.  Review options of places to exercise outside of rehab.  Review guidelines for sex with heart disease.   Cardiac Drugs I:  -Group instruction provided by verbal instruction and written materials to support subject.  Instructor reviews cardiac drug classes: antiplatelets, anticoagulants, beta blockers, and statins.  Instructor discusses reasons, side effects, and lifestyle considerations for each drug class.   Cardiac Drugs II:  -Group instruction provided by verbal instruction and written materials to support subject.  Instructor reviews cardiac drug classes: angiotensin converting enzyme inhibitors (ACE-I), angiotensin II receptor blockers (ARBs), nitrates, and calcium channel blockers.  Instructor discusses reasons, side effects, and lifestyle considerations for each drug class.   Anatomy and Physiology of the Circulatory System:  Group verbal and written instruction and models provide basic cardiac anatomy and physiology, with the coronary electrical and arterial systems. Review of: AMI, Angina, Valve disease, Heart Failure, Peripheral Artery Disease, Cardiac Arrhythmia, Pacemakers, and the ICD.   Other Education:  -Group or individual verbal, written, or video instructions that support the educational goals of the cardiac rehab program.   Holiday Eating Survival Tips:  -Group instruction provided by PowerPoint slides, verbal discussion, and written materials to support subject matter. The  instructor gives patients tips, tricks, and techniques to help them not only survive but enjoy the holidays despite the onslaught of food that accompanies the holidays.   Knowledge Questionnaire Score: Knowledge Questionnaire Score - 12/12/18 1030      Knowledge Questionnaire Score   Pre Score  19/24       Core Components/Risk Factors/Patient Goals at  Admission: Personal Goals and Risk Factors at Admission - 12/12/18 1056      Core Components/Risk Factors/Patient Goals on Admission    Weight Management  Yes;Weight Loss    Intervention  Weight Management: Develop a combined nutrition and exercise program designed to reach desired caloric intake, while maintaining appropriate intake of nutrient and fiber, sodium and fats, and appropriate energy expenditure required for the weight goal.;Weight Management: Provide education and appropriate resources to help participant work on and attain dietary goals.;Weight Management/Obesity: Establish reasonable short term and long term weight goals.;Obesity: Provide education and appropriate resources to help participant work on and attain dietary goals.    Admit Weight  181 lb 3.5 oz (82.2 kg)    Goal Weight: Long Term  171 lb (77.6 kg)    Expected Outcomes  Short Term: Continue to assess and modify interventions until short term weight is achieved;Long Term: Adherence to nutrition and physical activity/exercise program aimed toward attainment of established weight goal;Weight Loss: Understanding of general recommendations for a balanced deficit meal plan, which promotes 1-2 lb weight loss per week and includes a negative energy balance of [phone] kcal/d;Understanding recommendations for meals to include 15-35% energy as protein, 25-35% energy from fat, 35-60% energy from carbohydrates, less than 200mg  of dietary cholesterol, 20-35 gm of total fiber daily;Understanding of distribution of calorie intake throughout the day with the consumption of 4-5 meals/snacks     Diabetes  Yes    Intervention  Provide education about signs/symptoms and action to take for hypo/hyperglycemia.;Provide education about proper nutrition, including hydration, and aerobic/resistive exercise prescription along with prescribed medications to achieve blood glucose in normal ranges: Fasting glucose 65-99 mg/dL    Expected Outcomes  Long Term: Attainment of HbA1C < 7%.;Short Term: Participant verbalizes understanding of the signs/symptoms and immediate care of hyper/hypoglycemia, proper foot care and importance of medication, aerobic/resistive exercise and nutrition plan for blood glucose control.    Hypertension  Yes    Intervention  Provide education on lifestyle modifcations including regular physical activity/exercise, weight management, moderate sodium restriction and increased consumption of fresh fruit, vegetables, and low fat dairy, alcohol moderation, and smoking cessation.;Monitor prescription use compliance.    Expected Outcomes  Short Term: Continued assessment and intervention until BP is < 140/46mm HG in hypertensive participants. < 130/5mm HG in hypertensive participants with diabetes, heart failure or chronic kidney disease.;Long Term: Maintenance of blood pressure at goal levels.    Lipids  Yes    Intervention  Provide education and support for participant on nutrition & aerobic/resistive exercise along with prescribed medications to achieve LDL 70mg , HDL >40mg .    Expected Outcomes  Short Term: Participant states understanding of desired cholesterol values and is compliant with medications prescribed. Participant is following exercise prescription and nutrition guidelines.;Long Term: Cholesterol controlled with medications as prescribed, with individualized exercise RX and with personalized nutrition plan. Value goals: LDL < 70mg , HDL > 40 mg.       Core Components/Risk Factors/Patient Goals Review:    Core Components/Risk Factors/Patient Goals at Discharge (Final  Review):    ITP Comments: ITP Comments    Row Name 12/12/18 1030           ITP Comments  Dr. Fransico Him, Medical Director          Comments:Breia attended orientation from [phone] to [phone] to review rules and guidelines for program. Completed 6 minute walk test, Intitial ITP, and exercise prescription.  VSS. Telemetry-Sinus Rhythm with  PAC's.  Asymptomatic.Barnet Pall, RN,BSN 12/12/2018  12:04 PM

## 2018-12-12 NOTE — Progress Notes (Signed)
[address]       [place] [address]             [phone]      Arminta O Roscoe [place] Medical Record #[id number] Date of Birth: April 24, 1941  Referring: Jettie Booze, MD Primary Care: Aletha Halim., PA-C Primary Cardiologist: Sinclair Grooms, MD   Chief Complaint:   POST OP FOLLOW UP OPERATIVE REPORT DATE OF PROCEDURE:  09/27/2018 PREOPERATIVE DIAGNOSIS:  Critical left main coronary artery disease with ostial right moderate aortic stenosis, pulmonary hypertension. POSTOPERATIVE DIAGNOSIS:  Critical left main coronary artery disease with ostial right moderate aortic stenosis, pulmonary hypertension. SURGICAL PROCEDURE:  Coronary artery bypass grafting x3 with the left internal mammary to the left anterior descending coronary artery, reverse saphenous vein graft to the obtuse marginal coronary artery, reverse saphenous vein graft to the right  coronary artery with right thigh greater saphenous endoscopic vein harvesting and aortic valve replacement with pericardial tissue valve, [employer] model #3300TFX 21 mm, serial #[id number] and placement of AtriCure left atrial clip 35 mm. SURGEON:  Lanelle Bal, MD    History of Present Illness:     Patient returns to the office today in follow-up after her aortic valve replacement and coronary artery bypass grafting for critical left main disease and aortic stenosis.  The patient has known underlying pulmonary hypertension.  She notes that she has had significant improvement in her overall functional capacity, much less short of breath than she has not been in the past.  As far she is aware that she is been remaining in sinus rhythm.    Past Medical History:  Diagnosis Date  . Coronary artery disease   . Depression   . GERD (gastroesophageal reflux disease)   . Heart murmur   . HTN (hypertension)   . Hyperlipidemia   . Iron deficiency anemia   . Obesity (BMI 30-39.9) 03/04/2014  . OSA  on CPAP    severe with AHI 31/hr  . PAF (paroxysmal atrial fibrillation) ([facility])   . Pneumonia    "twice; years ago" (09/24/2018)  . Skin cancer    "left neck; burned off"  . Type II diabetes mellitus ([facility])      Social History   Tobacco Use  Smoking Status Never Smoker  Smokeless Tobacco Never Used    Social History   Substance and Sexual Activity  Alcohol Use Never  . Frequency: Never     Allergies  Allergen Reactions  . Minocin [Minocycline Hcl] Swelling and Other (See Comments)    THROAT SWELLING  . Benazepril Cough  . Hydrochlorothiazide Itching  . Tape Rash    Current Outpatient Medications  Medication Sig Dispense Refill  . acetaminophen (TYLENOL) 325 MG tablet Take 650 mg by mouth every 6 (six) hours as needed for moderate pain or headache.    [name] amiodarone (PACERONE) 200 MG tablet Take 200 mg by mouth daily.    [name] apixaban (ELIQUIS) 5 MG TABS tablet Take 1 tablet (5 mg total) by mouth 2 (two) times daily. 60 tablet 10  . aspirin EC 81 MG EC tablet Take 1 tablet (81 mg total) by mouth daily.    [name] buPROPion (WELLBUTRIN XL) 300 MG 24 hr tablet Take 300 mg by mouth daily.     . carvedilol (COREG) 3.125 MG tablet Take 1 tablet (3.125 mg total) by mouth 2 (two) times daily with a meal. 60 tablet 3  . doxylamine, Sleep, (UNISOM) 25  MG tablet Take 25 mg by mouth at bedtime as needed for sleep.    . fexofenadine (ALLEGRA) 60 MG tablet Take 60 mg by mouth daily as needed for allergies.     . furosemide (LASIX) 20 MG tablet TAKE 1 TABLET BY MOUTH DAILY 90 tablet 2  . liraglutide (VICTOZA) 18 MG/3ML SOPN Inject 1.8 mg into the skin daily.     [name] loperamide (IMODIUM) 2 MG capsule Take 1 capsule (2 mg total) by mouth as needed for diarrhea or loose stools. 30 capsule 0  . MAGNESIUM-OXIDE 400 (241.3 Mg) MG tablet TAKE 1 TABLET BY MOUTH TWICE DAILY 180 tablet 3  . metFORMIN (GLUCOPHAGE) 500 MG tablet Take 1 tablet (500 mg total) by mouth 2 (two) times daily with a meal. 60 tablet 3   . oxyCODONE (OXY IR/ROXICODONE) 5 MG immediate release tablet Take 1-2 tablets (5-10 mg total) by mouth every 4 (four) hours as needed for severe pain. 30 tablet 0  . Potassium (GNP POTASSIUM) 99 MG TABS Take 99 mg by mouth 2 (two) times daily.    . rosuvastatin (CRESTOR) 20 MG tablet Take 20 mg by mouth daily.    . sildenafil (REVATIO) 20 MG tablet Take 1 tablet (20 mg total) by mouth 3 (three) times daily. 90 tablet 3   No current facility-administered medications for this visit.        Physical Exam: BP (!) 158/66 (BP Location: Left Arm, Patient Position: Sitting, Cuff Size: Large)   Pulse 84   Resp 18   Ht 5\' 3"  (1.6 m)   Wt 181 lb 9.6 oz (82.4 kg)   LMP  (LMP Unknown)   SpO2 98% Comment: RA  BMI 32.17 kg/m  General appearance: alert, cooperative, appears older than stated age and no distress Head: Normocephalic, without obvious abnormality, atraumatic Neck: no adenopathy, no carotid bruit, no JVD, supple, symmetrical, trachea midline and thyroid not enlarged, symmetric, no tenderness/mass/nodules Lymph nodes: Cervical, supraclavicular, and axillary nodes normal. Resp: diminished breath sounds bibasilar Back: symmetric, no curvature. ROM normal. No CVA tenderness. Cardio: regular rate and rhythm, S1, S2 normal, no murmur, click, rub or gallop GI: soft, non-tender; bowel sounds normal; no masses,  no organomegaly Extremities: Minor bruising and some skin tears on her forearms where she is bumped into things , none appear actively infected Neurologic: Grossly normal    Diagnostic Studies & Laboratory data:     Recent Radiology Findings:  Dg Chest 2 View  Result Date: 12/12/2018 CLINICAL DATA:  Pt c/o CABG 09/27/2018. Pt has no complaints. Hx of GERD, heart murmur, HTN, pneumonia, diabetes. EXAM: CHEST - 2 VIEW COMPARISON:  11/04/2018 FINDINGS: Opacity in left mid lung has decreased in density, consistent with improved atelectasis. Remainder of the lungs is clear. No pleural  effusion.  No pneumothorax. Changes from the prior CABG surgery are stable. Cardiac silhouette is normal in size and configuration. Normal mediastinal and hilar contours. Skeletal structures are intact. IMPRESSION: No acute cardiopulmonary disease. Electronically Signed   By: Lajean Manes M.D.   On: 12/12/2018 14:15   I have independently reviewed the above radiology studies  and reviewed the findings with the patient.  Recent Lab Findings: Lab Results  Component Value Date   WBC 9.7 11/05/2018   HGB 11.7 (L) 11/05/2018   HCT 38.2 11/05/2018   PLT 256 11/05/2018   GLUCOSE 124 (H) 11/15/2018   CHOL 145 05/08/2018   TRIG 152 (H) 05/08/2018   HDL 43 05/08/2018   LDLCALC 72  05/08/2018   ALT 53 (H) 09/30/2018   AST 30 09/30/2018   NA 140 11/15/2018   K 4.2 11/15/2018   CL 105 11/15/2018   CREATININE 1.24 (H) 11/15/2018   BUN 14 11/15/2018   CO2 24 11/15/2018   TSH 3.000 05/08/2018   INR 1.47 09/27/2018   HGBA1C 5.8 (H) 10/04/2018      Assessment / Plan: Patient making good progress following aortic valve replacement and coronary artery bypass grafting for critical left main disease-she is making good progress with physical recovery and is less symptomatic than she had been before.  Her wounds are healing nicely She does note easy bruisability and has recent bruises on her arms where she has bumped into things.  Cardiology plans to stop her amiodarone at the end of March, monitor and if no further atrial fibrillation discontinue her anticoagulation.  She is followed closely by cardiology, I have not made a return appointment to be seen in the surgical office but would be glad to see her at her or cardiology request.

## 2018-12-18 ENCOUNTER — Other Ambulatory Visit: Payer: Self-pay

## 2018-12-18 ENCOUNTER — Encounter (HOSPITAL_COMMUNITY)
Admission: RE | Admit: 2018-12-18 | Discharge: 2018-12-18 | Disposition: A | Discharge status: Home / Self Care | Payer: Medicare Other | Source: Ambulatory Visit | Attending: Interventional Cardiology | Admitting: Interventional Cardiology

## 2018-12-18 ENCOUNTER — Ambulatory Visit (HOSPITAL_COMMUNITY): Payer: Medicare Other

## 2018-12-18 DIAGNOSIS — Z952 Presence of prosthetic heart valve: Secondary | ICD-10-CM

## 2018-12-18 DIAGNOSIS — Z951 Presence of aortocoronary bypass graft: Secondary | ICD-10-CM

## 2018-12-18 LAB — GLUCOSE, CAPILLARY
Glucose-Capillary: 222 mg/dL — ABNORMAL HIGH (ref 70–99)
Glucose-Capillary: 191 mg/dL — ABNORMAL HIGH (ref 70–99)

## 2018-12-18 NOTE — Progress Notes (Signed)
Daily Session Note  Patient Details  Name: Doris Jefferson MRN: [id number] Date of Birth: 07-Apr-1941 Referring Provider:     CARDIAC REHAB PHASE II ORIENTATION from 12/12/2018 in [facility]  Referring Provider  Belva Crome, MD       Encounter Date: 12/18/2018  Check In: Session Check In - 12/18/18 1102      Check-In   Supervising physician immediately available to respond to emergencies  [facility] immediately available    Physician(s)  Dr. Maylene Roes     Location  [facility]    Staff Present  Dorma Russell, MS,ACSM CEP, Exercise Physiologist;Freja Faro, RN, BSN;Joann Rion, RN, Deland Pretty, MS, ACSM CEP, Exercise Physiologist    Medication changes reported      No    Fall or balance concerns reported     No    Tobacco Cessation  No Change    Warm-up and Cool-down  Performed as group-led instruction    Resistance Training Performed  No    VAD Patient?  No    PAD/SET Patient?  No      Pain Assessment   Currently in Pain?  No/denies       Capillary Blood Glucose: Results for orders placed or performed during the hospital encounter of 12/18/18 (from the past 24 hour(s))  Glucose, capillary     Status: Abnormal   Collection Time: 12/18/18 10:56 AM  Result Value Ref Range   Glucose-Capillary 191 (H) 70 - 99 mg/dL      Social History   Tobacco Use  Smoking Status Never Smoker  Smokeless Tobacco Never Used    Goals Met:  No report of cardiac concerns or symptoms  Goals Unmet:  Not Applicable  Comments: [facility] started cardiac rehab today.  Pt tolerated light exercise without difficulty. VSS, telemetry-Sinus Rhythm with PAC's, asymptomatic.  Medication list reconciled. Pt denies barriers to medicaiton compliance.  PSYCHOSOCIAL ASSESSMENT:  PHQ-0. Pt exhibits positive coping skills, hopeful outlook with supportive family. No psychosocial needs identified at this time, no psychosocial interventions necessary.    Pt  enjoys reading .   Pt oriented to exercise equipment and routine.    Understanding verbalized.   Dr. Fransico Him is Medical Director for Cardiac Rehab at [facility]

## 2018-12-20 ENCOUNTER — Ambulatory Visit (HOSPITAL_COMMUNITY): Payer: Medicare Other

## 2018-12-20 ENCOUNTER — Encounter (HOSPITAL_COMMUNITY)
Admission: RE | Admit: 2018-12-20 | Discharge: 2018-12-20 | Disposition: A | Discharge status: Home / Self Care | Payer: Medicare Other | Source: Ambulatory Visit | Attending: Interventional Cardiology | Admitting: Interventional Cardiology

## 2018-12-20 ENCOUNTER — Other Ambulatory Visit: Payer: Self-pay

## 2018-12-20 DIAGNOSIS — Z951 Presence of aortocoronary bypass graft: Secondary | ICD-10-CM

## 2018-12-20 DIAGNOSIS — Z952 Presence of prosthetic heart valve: Secondary | ICD-10-CM

## 2018-12-20 LAB — GLUCOSE, CAPILLARY
Glucose-Capillary: 173 mg/dL — ABNORMAL HIGH (ref 70–99)
Glucose-Capillary: 141 mg/dL — ABNORMAL HIGH (ref 70–99)

## 2018-12-23 ENCOUNTER — Telehealth (HOSPITAL_COMMUNITY): Payer: Self-pay | Admitting: Cardiac Rehabilitation

## 2018-12-23 ENCOUNTER — Ambulatory Visit (HOSPITAL_COMMUNITY): Payer: Medicare Other

## 2018-12-23 ENCOUNTER — Encounter (HOSPITAL_COMMUNITY): Payer: Medicare Other

## 2018-12-23 NOTE — Telephone Encounter (Signed)
Phone call to patient to notify of [facility] Phase II departmental closing for 2 weeks.  Pt verbalized understanding.  Joann Rion, RN, BSN Cardiac Pulmonary Rehab  

## 2018-12-25 ENCOUNTER — Encounter (HOSPITAL_COMMUNITY): Payer: Medicare Other

## 2018-12-25 ENCOUNTER — Ambulatory Visit (HOSPITAL_COMMUNITY): Payer: Medicare Other

## 2018-12-26 ENCOUNTER — Encounter (HOSPITAL_COMMUNITY): Payer: Self-pay | Admitting: *Deleted

## 2018-12-26 DIAGNOSIS — Z952 Presence of prosthetic heart valve: Secondary | ICD-10-CM

## 2018-12-26 DIAGNOSIS — Z951 Presence of aortocoronary bypass graft: Secondary | ICD-10-CM

## 2018-12-27 ENCOUNTER — Ambulatory Visit (HOSPITAL_COMMUNITY): Payer: Medicare Other

## 2018-12-27 ENCOUNTER — Encounter (HOSPITAL_COMMUNITY): Payer: Medicare Other

## 2018-12-30 ENCOUNTER — Encounter (HOSPITAL_COMMUNITY): Payer: Medicare Other

## 2018-12-30 ENCOUNTER — Encounter (HOSPITAL_COMMUNITY): Payer: Self-pay | Admitting: *Deleted

## 2018-12-30 ENCOUNTER — Ambulatory Visit (HOSPITAL_COMMUNITY): Payer: Medicare Other

## 2018-12-30 ENCOUNTER — Telehealth (HOSPITAL_COMMUNITY): Payer: Self-pay | Admitting: *Deleted

## 2018-12-30 NOTE — Telephone Encounter (Signed)
Called to notify patient that the cardiac and pulmonary rehabilitation department will be closed for 4 weeks due to COVID-19 restrictions. Pt verbalized understanding.  Charlea Nardo M Charline Hoskinson, MS, ACSM CEP  

## 2018-12-30 NOTE — Addendum Note (Signed)
Encounter addended by: Ivonne Andrew, RD on: 12/30/2018 12:24 PM  Actions taken: Visit Navigator Flowsheet section accepted

## 2018-12-30 NOTE — Progress Notes (Signed)
[id number] Reviewed home exercise guidelines with patient including endpoints, temperature precautions, target heart rate and rate of perceived exertion. Pt is walking 15 minutes BID as her mode of home exercise. Pt voices understanding of instructions given. Sol Passer, MS, ACSM CEP

## 2018-12-31 ENCOUNTER — Encounter (HOSPITAL_COMMUNITY): Payer: Self-pay | Admitting: *Deleted

## 2018-12-31 DIAGNOSIS — Z951 Presence of aortocoronary bypass graft: Secondary | ICD-10-CM

## 2018-12-31 DIAGNOSIS — Z952 Presence of prosthetic heart valve: Secondary | ICD-10-CM

## 2018-12-31 NOTE — Progress Notes (Signed)
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  acetaminophen (TYLENOL) 325 MG tablet, Take 650 mg by mouth every 6 (six) hours as needed for moderate pain or headache., Disp: , Rfl:  .  amiodarone (PACERONE) 200 MG tablet, Take 200 mg by mouth daily., Disp: , Rfl:  .  apixaban (ELIQUIS) 5 MG TABS tablet, Take 1 tablet (5 mg total) by mouth 2 (two) times daily., Disp: 60 tablet, Rfl: 10 .  aspirin EC 81 MG EC tablet, Take 1 tablet (81 mg total) by mouth daily., Disp: , Rfl:  .  buPROPion (WELLBUTRIN XL) 300 MG 24 hr tablet, Take 300 mg by mouth daily. , Disp: , Rfl:  .  carvedilol (COREG) 3.125 MG tablet, Take 1 tablet (3.125 mg total) by mouth 2 (two) times daily with a meal., Disp: 60 tablet, Rfl: 3 .  doxylamine, Sleep, (UNISOM) 25 MG tablet, Take 25 mg by mouth at bedtime as needed for sleep., Disp: , Rfl:  .  fexofenadine (ALLEGRA) 60 MG tablet, Take 60 mg by mouth daily as needed for allergies. , Disp: , Rfl:  .  furosemide (LASIX) 20 MG tablet, TAKE 1 TABLET BY MOUTH DAILY, Disp: 90 tablet, Rfl: 2 .  liraglutide (VICTOZA) 18 MG/3ML SOPN, Inject 1.8 mg into the skin daily. , Disp: , Rfl:  .  loperamide (IMODIUM) 2 MG capsule, Take 1 capsule (2 mg total) by mouth as needed for diarrhea or loose stools., Disp: 30 capsule, Rfl: 0 .  MAGNESIUM-OXIDE 400 (241.3 Mg) MG tablet, TAKE 1 TABLET BY MOUTH TWICE DAILY, Disp: 180 tablet, Rfl: 3 .  metFORMIN  (GLUCOPHAGE) 500 MG tablet, Take 1 tablet (500 mg total) by mouth 2 (two) times daily with a meal., Disp: 60 tablet, Rfl: 3 .  oxyCODONE (OXY IR/ROXICODONE) 5 MG immediate release tablet, Take 1-2 tablets (5-10 mg total) by mouth every 4 (four) hours as needed for severe pain., Disp: 30 tablet, Rfl: 0 .  Potassium (GNP POTASSIUM) 99 MG TABS, Take 99 mg by mouth 2 (two) times daily., Disp: , Rfl:  .  rosuvastatin (CRESTOR) 20 MG tablet, Take 20 mg by mouth daily., Disp: , Rfl:  .  sildenafil (REVATIO) 20 MG tablet, Take 1 tablet (20 mg total) by mouth 3 (three) times daily., Disp: 90 tablet, Rfl: 3  Past Medical History: Past Medical History:  Diagnosis Date  . Coronary artery disease   . Depression   . GERD (gastroesophageal reflux disease)   . Heart murmur   . HTN (hypertension)   . Hyperlipidemia   . Iron deficiency anemia   . Obesity (BMI 30-39.9) 03/04/2014  . OSA on CPAP    severe with AHI 31/hr  . PAF (paroxysmal atrial fibrillation) ([place])   . Pneumonia    "twice; years ago" (09/24/2018)  . Skin cancer    "left neck; burned off"  . Type II diabetes mellitus ([place])  Tobacco Use: Social History   Tobacco Use  Smoking Status Never Smoker  Smokeless Tobacco Never Used    Labs: Recent Review Flowsheet Data    Labs for ITP Cardiac and Pulmonary Rehab Latest Ref Rng & Units 10/05/2018 10/06/2018 10/07/2018 10/08/2018 10/09/2018   Cholestrol 100 - 199 mg/dL - - - - -   LDLCALC 0 - 99 mg/dL - - - - -   HDL >39 mg/dL - - - - -   Trlycerides 0 - 149 mg/dL - - - - -   Hemoglobin A1c 4.8 - 5.6 % - - - - -   PHART 7.350 - 7.450 - - - - -   PCO2ART 32.0 - 48.0 mmHg - - - - -   HCO3 20.0 - 28.0 mmol/L - - - - -   TCO2 22 - 32 mmol/L - - - - -   ACIDBASEDEF 0.0 - 2.0 mmol/L - - - - -   O2SAT % 63.3 59.3 60.3 62.3 53.3      Capillary Blood Glucose: Lab Results  Component Value Date   GLUCAP 141 (H) 12/20/2018   GLUCAP 173 (H) 12/20/2018   GLUCAP 191 (H) 12/18/2018    GLUCAP 222 (H) 12/18/2018   GLUCAP 114 (H) 10/09/2018     Exercise Target Goals: Exercise Program Goal: Individual exercise prescription set using results from initial 6 min walk test and THRR while considering  patient's activity barriers and safety.   Exercise Prescription Goal: Initial exercise prescription builds to 30-45 minutes a day of aerobic activity, 2-3 days per week.  Home exercise guidelines will be given to patient during program as part of exercise prescription that the participant will acknowledge.  Activity Barriers & Risk Stratification: Activity Barriers & Cardiac Risk Stratification - 12/12/18 1055      Activity Barriers & Cardiac Risk Stratification   Activity Barriers  None    Cardiac Risk Stratification  High       6 Minute Walk: 6 Minute Walk    Row Name 12/12/18 1054         6 Minute Walk   Phase  Initial     Distance  673 feet     Walk Time  6 minutes     # of Rest Breaks  0     MPH  1.3     METS  0.8     RPE  12     Perceived Dyspnea   0     VO2 Peak  2.8     Symptoms  Yes (comment)     Comments  Fatigue +2     Resting HR  70 bpm     Resting BP  140/70     Resting Oxygen Saturation   99 %     Exercise Oxygen Saturation  during 6 min walk  97 %     Max Ex. HR  88 bpm     Max Ex. BP  124/60     2 Minute Post BP  138/62        Oxygen Initial Assessment:   Oxygen Re-Evaluation:   Oxygen Discharge (Final Oxygen Re-Evaluation):   Initial Exercise Prescription: Initial Exercise Prescription - 12/12/18 1100      Date of Initial Exercise RX and Referring Provider   Date  12/12/18    Referring Provider  Belva Crome, MD     Expected Discharge Date  03/19/19      NuStep   Level  1  SPM  75    Minutes  20    METs  1.5      Track   Laps  4    Minutes  10    METs  0.9      Prescription Details   Frequency (times per week)  3x    Duration  Progress to 30 minutes of continuous aerobic without signs/symptoms of physical  distress      Intensity   THRR 40-80% of Max Heartrate  57-114    Ratings of Perceived Exertion  11-13    Perceived Dyspnea  0-4      Progression   Progression  Continue progressive overload as per policy without signs/symptoms or physical distress.      Resistance Training   Training Prescription  Yes    Weight  2lbs    Reps  10-15       Perform Capillary Blood Glucose checks as needed.  Exercise Prescription Changes: Exercise Prescription Changes    Row Name 12/18/18 1000 12/20/18 0952           Response to Exercise   Blood Pressure (Admit)  130/60  122/70      Blood Pressure (Exercise)  132/60  158/62      Blood Pressure (Exit)  130/60  144/60      Heart Rate (Admit)  76 bpm  80 bpm      Heart Rate (Exercise)  84 bpm  89 bpm      Heart Rate (Exit)  75 bpm  80 bpm      Rating of Perceived Exertion (Exercise)  12  13      Symptoms  none  none      Duration  Progress to 30 minutes of  aerobic without signs/symptoms of physical distress  Progress to 30 minutes of  aerobic without signs/symptoms of physical distress      Intensity  THRR unchanged  THRR unchanged        Progression   Progression  Continue to progress workloads to maintain intensity without signs/symptoms of physical distress.  Continue to progress workloads to maintain intensity without signs/symptoms of physical distress.      Average METs  1.6  1.9        Resistance Training   Training Prescription  No Relaxation day, no weights.  Yes      Weight  -  2lbs      Reps  -  10-15      Time  -  10 Minutes        Interval Training   Interval Training  No  No        NuStep   Level  1  1      SPM  75  75      Minutes  20  20      METs  1.2  1.7        Track   Laps  6  7      Minutes  10  10      METs  1.2  2.22         Exercise Comments: Exercise Comments    Row Name 12/18/18 1050 12/30/18 1113         Exercise Comments  Patient tolerated low intensity exercise well without symptoms.   Reviewed home exericse guidelines with patient via telephone. Home exercise packet mailed to patient.         Exercise Goals and Review: Exercise Goals  [place] Name 12/12/18 1055             Exercise Goals   Increase Physical Activity  Yes       Intervention  Develop an individualized exercise prescription for aerobic and resistive training based on initial evaluation findings, risk stratification, comorbidities and participant's personal goals.;Provide advice, education, support and counseling about physical activity/exercise needs.       Expected Outcomes  Short Term: Attend rehab on a regular basis to increase amount of physical activity.;Long Term: Add in home exercise to make exercise part of routine and to increase amount of physical activity.;Long Term: Exercising regularly at least 3-5 days a week.       Increase Strength and Stamina  Yes       Intervention  Provide advice, education, support and counseling about physical activity/exercise needs.;Develop an individualized exercise prescription for aerobic and resistive training based on initial evaluation findings, risk stratification, comorbidities and participant's personal goals.       Expected Outcomes  Short Term: Increase workloads from initial exercise prescription for resistance, speed, and METs.;Short Term: Perform resistance training exercises routinely during rehab and add in resistance training at home;Long Term: Improve cardiorespiratory fitness, muscular endurance and strength as measured by increased METs and functional capacity (6MWT)       Able to understand and use rate of perceived exertion (RPE) scale  Yes       Intervention  Provide education and explanation on how to use RPE scale       Expected Outcomes  Short Term: Able to use RPE daily in rehab to express subjective intensity level;Long Term:  Able to use RPE to guide intensity level when exercising independently       Knowledge and understanding of Target Heart  Rate Range (THRR)  Yes       Intervention  Provide education and explanation of THRR including how the numbers were predicted and where they are located for reference       Expected Outcomes  Short Term: Able to state/look up THRR;Short Term: Able to use daily as guideline for intensity in rehab;Long Term: Able to use THRR to govern intensity when exercising independently       Able to check pulse independently  Yes       Intervention  Provide education and demonstration on how to check pulse in carotid and radial arteries.;Review the importance of being able to check your own pulse for safety during independent exercise       Expected Outcomes  Short Term: Able to explain why pulse checking is important during independent exercise;Long Term: Able to check pulse independently and accurately       Understanding of Exercise Prescription  Yes       Intervention  Provide education, explanation, and written materials on patient's individual exercise prescription       Expected Outcomes  Short Term: Able to explain program exercise prescription;Long Term: Able to explain home exercise prescription to exercise independently          Exercise Goals Re-Evaluation : Exercise Goals Re-Evaluation    Row Name 12/18/18 1050 12/25/18 1356 12/30/18 1113         Exercise Goal Re-Evaluation   Exercise Goals Review  Increase Physical Activity;Able to understand and use rate of perceived exertion (RPE) scale;Increase Strength and Stamina  -  Increase Physical Activity;Able to understand and use rate of perceived exertion (RPE) scale;Increase Strength and Stamina;Knowledge and understanding of Target Heart Rate Range (THRR);Understanding  of Exercise Prescription;Able to check pulse independently     Comments  Patient able to understand and use RPE scale appropriately.  Temporary department closure due to COVID-19.  Contacted patient via telephone to review home exercise guidelines including THRR, RPE scale, and  endpoints for exercise. Pt is walking 15 minutes, 2 days/week using walking stick for balance. Pt has 2lbs weights at home as well. Pt knows how to count her pulse.     Expected Outcomes  Progress workloads as tolerated to help increase walking tolerance, strength and stamina.  -  Patient will continue walking 30 minutes daily to help improve cardiorespiratory fitness.         Discharge Exercise Prescription (Final Exercise Prescription Changes): Exercise Prescription Changes - 12/20/18 0952      Response to Exercise   Blood Pressure (Admit)  122/70    Blood Pressure (Exercise)  158/62    Blood Pressure (Exit)  144/60    Heart Rate (Admit)  80 bpm    Heart Rate (Exercise)  89 bpm    Heart Rate (Exit)  80 bpm    Rating of Perceived Exertion (Exercise)  13    Symptoms  none    Duration  Progress to 30 minutes of  aerobic without signs/symptoms of physical distress    Intensity  THRR unchanged      Progression   Progression  Continue to progress workloads to maintain intensity without signs/symptoms of physical distress.    Average METs  1.9      Resistance Training   Training Prescription  Yes    Weight  2lbs    Reps  10-15    Time  10 Minutes      Interval Training   Interval Training  No      NuStep   Level  1    SPM  75    Minutes  20    METs  1.7      Track   Laps  7    Minutes  10    METs  2.22       Nutrition:  Target Goals: Understanding of nutrition guidelines, daily intake of sodium 1500mg , cholesterol 200mg , calories 30% from fat and 7% or less from saturated fats, daily to have 5 or more servings of fruits and vegetables.  Biometrics: Pre Biometrics - 12/12/18 1054      Pre Biometrics   Height  5' 1.5" (1.562 m)    Weight  181 lb 3.5 oz (82.2 kg)    Waist Circumference  43 inches    Hip Circumference  46 inches    Waist to Hip Ratio  0.93 %    BMI (Calculated)  33.69    Triceps Skinfold  29 mm    % Body Fat  46.5 %    Grip Strength  27 kg     Flexibility  0 in    Single Leg Stand  2.79 seconds        Nutrition Therapy Plan and Nutrition Goals: Nutrition Therapy & Goals - 12/23/18 1150      Nutrition Therapy   Diet  heart healthy, carb modified      Personal Nutrition Goals   Nutrition Goal  Pt to identify and limit food sources of sodium, saturated fat, trans fat, and refined carbohydrates      Intervention Plan   Intervention  Prescribe, educate and counsel regarding individualized specific dietary modifications aiming towards targeted core components such as weight, hypertension, lipid management,  diabetes, heart failure and other comorbidities.    Expected Outcomes  Short Term Goal: Understand basic principles of dietary content, such as calories, fat, sodium, cholesterol and nutrients.;Long Term Goal: Adherence to prescribed nutrition plan.       Nutrition Assessments: Nutrition Assessments - 12/23/18 1151      MEDFICTS Scores   Pre Score  59       Nutrition Goals Re-Evaluation:   Nutrition Goals Re-Evaluation:   Nutrition Goals Discharge (Final Nutrition Goals Re-Evaluation):   Psychosocial: Target Goals: Acknowledge presence or absence of significant depression and/or stress, maximize coping skills, provide positive support system. Participant is able to verbalize types and ability to use techniques and skills needed for reducing stress and depression.  Initial Review & Psychosocial Screening: Initial Psych Review & Screening - 12/12/18 1029      Initial Review   Current issues with  None Identified      Family Dynamics   Good Support System?  Yes   Pt lists her husband as a source of support.  He is present at today's orienation session.      Barriers   Psychosocial barriers to participate in program  There are no identifiable barriers or psychosocial needs.      Screening Interventions   Interventions  Encouraged to exercise       Quality of Life Scores: Quality of Life - 12/12/18 1029       Quality of Life   Select  Quality of Life      Quality of Life Scores   Health/Function Pre  22.63 %    Socioeconomic Pre  26.07 %    Psych/Spiritual Pre  25.93 %    Family Pre  26 %    GLOBAL Pre  24.51 %      Scores of 19 and below usually indicate a poorer quality of life in these areas.  A difference of  2-3 points is a clinically meaningful difference.  A difference of 2-3 points in the total score of the Quality of Life Index has been associated with significant improvement in overall quality of life, self-image, physical symptoms, and general health in studies assessing change in quality of life.  PHQ-9: Recent Review Flowsheet Data    Depression screen [facility] 2/9 12/18/2018 07/17/2015   Decreased Interest 0 0   Down, Depressed, Hopeless 0 0   PHQ - 2 Score 0 0     Interpretation of Total Score  Total Score Depression Severity:  1-4 = Minimal depression, 5-9 = Mild depression, 10-14 = Moderate depression, 15-19 = Moderately severe depression, 20-27 = Severe depression   Psychosocial Evaluation and Intervention:   Psychosocial Re-Evaluation: Psychosocial Re-Evaluation    [place] Name 12/26/18 1207             Psychosocial Re-Evaluation   Current issues with  None Identified       Comments   unable to assess cuurently as Exercise at cardiac rehab is currently on hold per recommended guidelines to prevent the spread of COVID-19          Psychosocial Discharge (Final Psychosocial Re-Evaluation): Psychosocial Re-Evaluation - 12/26/18 1207      Psychosocial Re-Evaluation   Current issues with  None Identified    Comments   unable to assess cuurently as Exercise at cardiac rehab is currently on hold per recommended guidelines to prevent the spread of COVID-19       Vocational Rehabilitation: Provide vocational rehab assistance to qualifying candidates.   Vocational  Rehab Evaluation & Intervention: Vocational Rehab - 12/12/18 1127      Initial Vocational Rehab  Evaluation & Intervention   Assessment shows need for Vocational Rehabilitation  No   Darling is retired and does not need vocational rehab at this time      Education: Education Goals: Education classes will be provided on a weekly basis, covering required topics. Participant will state understanding/return demonstration of topics presented.  Learning Barriers/Preferences: Learning Barriers/Preferences - 12/12/18 1056      Learning Barriers/Preferences   Learning Barriers  Sight;Hearing    Learning Preferences  Skilled Demonstration;Verbal Instruction;Individual Instruction       Education Topics: Count Your Pulse:  -Group instruction provided by verbal instruction, demonstration, patient participation and written materials to support subject.  Instructors address importance of being able to find your pulse and how to count your pulse when at home without a heart monitor.  Patients get hands on experience counting their pulse with staff help and individually.   Heart Attack, Angina, and Risk Factor Modification:  -Group instruction provided by verbal instruction, video, and written materials to support subject.  Instructors address signs and symptoms of angina and heart attacks.    Also discuss risk factors for heart disease and how to make changes to improve heart health risk factors.   Functional Fitness:  -Group instruction provided by verbal instruction, demonstration, patient participation, and written materials to support subject.  Instructors address safety measures for doing things around the house.  Discuss how to get up and down off the floor, how to pick things up properly, how to safely get out of a chair without assistance, and balance training.   Meditation and Mindfulness:  -Group instruction provided by verbal instruction, patient participation, and written materials to support subject.  Instructor addresses importance of mindfulness and meditation practice to help  reduce stress and improve awareness.  Instructor also leads participants through a meditation exercise.    Stretching for Flexibility and Mobility:  -Group instruction provided by verbal instruction, patient participation, and written materials to support subject.  Instructors lead participants through series of stretches that are designed to increase flexibility thus improving mobility.  These stretches are additional exercise for major muscle groups that are typically performed during regular warm up and cool down.   Hands Only CPR:  -Group verbal, video, and participation provides a basic overview of AHA guidelines for community CPR. Role-play of emergencies allow participants the opportunity to practice calling for help and chest compression technique with discussion of AED use.   Hypertension: -Group verbal and written instruction that provides a basic overview of hypertension including the most recent diagnostic guidelines, risk factor reduction with self-care instructions and medication management.    Nutrition I class: Heart Healthy Eating:  -Group instruction provided by PowerPoint slides, verbal discussion, and written materials to support subject matter. The instructor gives an explanation and review of the Therapeutic Lifestyle Changes diet recommendations, which includes a discussion on lipid goals, dietary fat, sodium, fiber, plant stanol/sterol esters, sugar, and the components of a well-balanced, healthy diet.   Nutrition II class: Lifestyle Skills:  -Group instruction provided by PowerPoint slides, verbal discussion, and written materials to support subject matter. The instructor gives an explanation and review of label reading, grocery shopping for heart health, heart healthy recipe modifications, and ways to make healthier choices when eating out.   Diabetes Question & Answer:  -Group instruction provided by PowerPoint slides, verbal discussion, and written materials to  support subject matter.  The instructor gives an explanation and review of diabetes co-morbidities, pre- and post-prandial blood glucose goals, pre-exercise blood glucose goals, signs, symptoms, and treatment of hypoglycemia and hyperglycemia, and foot care basics.   Diabetes Blitz:  -Group instruction provided by PowerPoint slides, verbal discussion, and written materials to support subject matter. The instructor gives an explanation and review of the physiology behind type 1 and type 2 diabetes, diabetes medications and rational behind using different medications, pre- and post-prandial blood glucose recommendations and Hemoglobin A1c goals, diabetes diet, and exercise including blood glucose guidelines for exercising safely.    Portion Distortion:  -Group instruction provided by PowerPoint slides, verbal discussion, written materials, and food models to support subject matter. The instructor gives an explanation of serving size versus portion size, changes in portions sizes over the last 20 years, and what consists of a serving from each food group.   Stress Management:  -Group instruction provided by verbal instruction, video, and written materials to support subject matter.  Instructors review role of stress in heart disease and how to cope with stress positively.     Exercising on Your Own:  -Group instruction provided by verbal instruction, power point, and written materials to support subject.  Instructors discuss benefits of exercise, components of exercise, frequency and intensity of exercise, and end points for exercise.  Also discuss use of nitroglycerin and activating EMS.  Review options of places to exercise outside of rehab.  Review guidelines for sex with heart disease.   Cardiac Drugs I:  -Group instruction provided by verbal instruction and written materials to support subject.  Instructor reviews cardiac drug classes: antiplatelets, anticoagulants, beta blockers, and statins.   Instructor discusses reasons, side effects, and lifestyle considerations for each drug class.   Cardiac Drugs II:  -Group instruction provided by verbal instruction and written materials to support subject.  Instructor reviews cardiac drug classes: angiotensin converting enzyme inhibitors (ACE-I), angiotensin II receptor blockers (ARBs), nitrates, and calcium channel blockers.  Instructor discusses reasons, side effects, and lifestyle considerations for each drug class.   Anatomy and Physiology of the Circulatory System:  Group verbal and written instruction and models provide basic cardiac anatomy and physiology, with the coronary electrical and arterial systems. Review of: AMI, Angina, Valve disease, Heart Failure, Peripheral Artery Disease, Cardiac Arrhythmia, Pacemakers, and the ICD.   Other Education:  -Group or individual verbal, written, or video instructions that support the educational goals of the cardiac rehab program.   Holiday Eating Survival Tips:  -Group instruction provided by PowerPoint slides, verbal discussion, and written materials to support subject matter. The instructor gives patients tips, tricks, and techniques to help them not only survive but enjoy the holidays despite the onslaught of food that accompanies the holidays.   Knowledge Questionnaire Score: Knowledge Questionnaire Score - 12/12/18 1030      Knowledge Questionnaire Score   Pre Score  19/24       Core Components/Risk Factors/Patient Goals at Admission: Personal Goals and Risk Factors at Admission - 12/12/18 1056      Core Components/Risk Factors/Patient Goals on Admission    Weight Management  Yes;Weight Loss    Intervention  Weight Management: Develop a combined nutrition and exercise program designed to reach desired caloric intake, while maintaining appropriate intake of nutrient and fiber, sodium and fats, and appropriate energy expenditure required for the weight goal.;Weight Management:  Provide education and appropriate resources to help participant work on and attain dietary goals.;Weight Management/Obesity: Establish reasonable short term and long term  weight goals.;Obesity: Provide education and appropriate resources to help participant work on and attain dietary goals.    Admit Weight  181 lb 3.5 oz (82.2 kg)    Goal Weight: Long Term  171 lb (77.6 kg)    Expected Outcomes  Short Term: Continue to assess and modify interventions until short term weight is achieved;Long Term: Adherence to nutrition and physical activity/exercise program aimed toward attainment of established weight goal;Weight Loss: Understanding of general recommendations for a balanced deficit meal plan, which promotes 1-2 lb weight loss per week and includes a negative energy balance of [phone] kcal/d;Understanding recommendations for meals to include 15-35% energy as protein, 25-35% energy from fat, 35-60% energy from carbohydrates, less than 200mg  of dietary cholesterol, 20-35 gm of total fiber daily;Understanding of distribution of calorie intake throughout the day with the consumption of 4-5 meals/snacks    Diabetes  Yes    Intervention  Provide education about signs/symptoms and action to take for hypo/hyperglycemia.;Provide education about proper nutrition, including hydration, and aerobic/resistive exercise prescription along with prescribed medications to achieve blood glucose in normal ranges: Fasting glucose 65-99 mg/dL    Expected Outcomes  Long Term: Attainment of HbA1C < 7%.;Short Term: Participant verbalizes understanding of the signs/symptoms and immediate care of hyper/hypoglycemia, proper foot care and importance of medication, aerobic/resistive exercise and nutrition plan for blood glucose control.    Hypertension  Yes    Intervention  Provide education on lifestyle modifcations including regular physical activity/exercise, weight management, moderate sodium restriction and increased consumption of  fresh fruit, vegetables, and low fat dairy, alcohol moderation, and smoking cessation.;Monitor prescription use compliance.    Expected Outcomes  Short Term: Continued assessment and intervention until BP is < 140/70mm HG in hypertensive participants. < 130/39mm HG in hypertensive participants with diabetes, heart failure or chronic kidney disease.;Long Term: Maintenance of blood pressure at goal levels.    Lipids  Yes    Intervention  Provide education and support for participant on nutrition & aerobic/resistive exercise along with prescribed medications to achieve LDL 70mg , HDL >40mg .    Expected Outcomes  Short Term: Participant states understanding of desired cholesterol values and is compliant with medications prescribed. Participant is following exercise prescription and nutrition guidelines.;Long Term: Cholesterol controlled with medications as prescribed, with individualized exercise RX and with personalized nutrition plan. Value goals: LDL < 70mg , HDL > 40 mg.       Core Components/Risk Factors/Patient Goals Review:  Goals and Risk Factor Review    Row Name 12/26/18 1348             Core Components/Risk Factors/Patient Goals Review   Personal Goals Review  Weight Management/Obesity;Hypertension;Lipids;Diabetes       Review   Exercise is currently on hold per recommended guidelines from the federal government to prevent the spread of COVID-19       Expected Outcomes  patient will participate in cardiac rehab for lifestyle modifications once exercise resumes at cardiac rehab          Core Components/Risk Factors/Patient Goals at Discharge (Final Review):  Goals and Risk Factor Review - 12/26/18 1348      Core Components/Risk Factors/Patient Goals Review   Personal Goals Review  Weight Management/Obesity;Hypertension;Lipids;Diabetes    Review   Exercise is currently on hold per recommended guidelines from the federal government to prevent the spread of COVID-19    Expected  Outcomes  patient will participate in cardiac rehab for lifestyle modifications once exercise resumes at cardiac rehab  ITP Comments: ITP Comments    Row Name 12/12/18 1030 12/26/18 1344         ITP Comments  Dr. Fransico Him, Medical Director   30 Day ITP Review. Exercise is currently on hold per recommended guidelines from the federal government to prevent the spread of COVID-19         Comments: See ITP comments.Barnet Pall, RN,BSN 12/31/2018 9:03 AM

## 2019-01-01 ENCOUNTER — Ambulatory Visit (HOSPITAL_COMMUNITY): Payer: Medicare Other

## 2019-01-01 ENCOUNTER — Encounter (HOSPITAL_COMMUNITY): Payer: Medicare Other

## 2019-01-02 ENCOUNTER — Telehealth (HOSPITAL_COMMUNITY): Payer: Self-pay

## 2019-01-02 NOTE — Telephone Encounter (Signed)
Phone call to pt for nutrition education and counseling for heart healthy diet. Left voicemail with contact information.   Laurina Bustle, MS, RD, LDN 01/02/2019 10:23 AM

## 2019-01-03 ENCOUNTER — Encounter (HOSPITAL_COMMUNITY): Payer: Medicare Other

## 2019-01-03 ENCOUNTER — Ambulatory Visit (HOSPITAL_COMMUNITY): Payer: Medicare Other

## 2019-01-06 ENCOUNTER — Ambulatory Visit (HOSPITAL_COMMUNITY): Payer: Medicare Other

## 2019-01-06 ENCOUNTER — Encounter (HOSPITAL_COMMUNITY): Payer: Medicare Other

## 2019-01-08 ENCOUNTER — Encounter (HOSPITAL_COMMUNITY): Payer: Medicare Other

## 2019-01-08 ENCOUNTER — Ambulatory Visit (HOSPITAL_COMMUNITY): Payer: Medicare Other

## 2019-01-10 ENCOUNTER — Encounter (HOSPITAL_COMMUNITY): Payer: Medicare Other

## 2019-01-10 ENCOUNTER — Ambulatory Visit (HOSPITAL_COMMUNITY): Payer: Medicare Other

## 2019-01-13 ENCOUNTER — Encounter (HOSPITAL_COMMUNITY): Payer: Medicare Other

## 2019-01-13 ENCOUNTER — Ambulatory Visit (HOSPITAL_COMMUNITY): Payer: Medicare Other

## 2019-01-15 ENCOUNTER — Telehealth (HOSPITAL_COMMUNITY): Payer: Self-pay | Admitting: *Deleted

## 2019-01-15 ENCOUNTER — Ambulatory Visit (HOSPITAL_COMMUNITY): Payer: Medicare Other

## 2019-01-15 ENCOUNTER — Encounter (HOSPITAL_COMMUNITY): Payer: Medicare Other

## 2019-01-15 NOTE — Telephone Encounter (Signed)
Called to notify patient that the cardiac and pulmonary rehabilitation department will be closed temporarily due to COVID-19 restrictions. Pt verbalized understanding.  Patient is walking and stretching as her mode of home exercise.   Sol Passer, MS, ACSM CEP 01/15/2019 1003

## 2019-01-16 ENCOUNTER — Other Ambulatory Visit: Payer: Self-pay | Admitting: *Deleted

## 2019-01-17 ENCOUNTER — Ambulatory Visit (HOSPITAL_COMMUNITY): Payer: Medicare Other

## 2019-01-17 ENCOUNTER — Encounter (HOSPITAL_COMMUNITY): Payer: Medicare Other

## 2019-01-20 ENCOUNTER — Encounter (HOSPITAL_COMMUNITY): Payer: Medicare Other

## 2019-01-20 ENCOUNTER — Ambulatory Visit (HOSPITAL_COMMUNITY): Payer: Medicare Other

## 2019-01-22 ENCOUNTER — Ambulatory Visit (HOSPITAL_COMMUNITY): Payer: Medicare Other

## 2019-01-22 ENCOUNTER — Encounter (HOSPITAL_COMMUNITY): Payer: Medicare Other

## 2019-01-24 ENCOUNTER — Ambulatory Visit (HOSPITAL_COMMUNITY): Payer: Medicare Other

## 2019-01-24 ENCOUNTER — Encounter (HOSPITAL_COMMUNITY): Payer: Medicare Other

## 2019-01-27 ENCOUNTER — Ambulatory Visit (HOSPITAL_COMMUNITY): Payer: Medicare Other

## 2019-01-27 ENCOUNTER — Encounter (HOSPITAL_COMMUNITY): Payer: Medicare Other

## 2019-01-29 ENCOUNTER — Ambulatory Visit (HOSPITAL_COMMUNITY): Payer: Medicare Other

## 2019-01-29 ENCOUNTER — Encounter (HOSPITAL_COMMUNITY): Payer: Medicare Other

## 2019-01-31 ENCOUNTER — Encounter (HOSPITAL_COMMUNITY): Payer: Medicare Other

## 2019-01-31 ENCOUNTER — Ambulatory Visit (HOSPITAL_COMMUNITY): Payer: Medicare Other

## 2019-02-03 ENCOUNTER — Encounter (HOSPITAL_COMMUNITY): Payer: Medicare Other

## 2019-02-03 ENCOUNTER — Ambulatory Visit (HOSPITAL_COMMUNITY): Payer: Medicare Other

## 2019-02-05 ENCOUNTER — Other Ambulatory Visit: Payer: Self-pay | Admitting: Physician Assistant

## 2019-02-05 ENCOUNTER — Encounter (HOSPITAL_COMMUNITY): Payer: Medicare Other

## 2019-02-05 ENCOUNTER — Ambulatory Visit (HOSPITAL_COMMUNITY): Payer: Medicare Other

## 2019-02-05 ENCOUNTER — Other Ambulatory Visit: Payer: Self-pay | Admitting: Interventional Cardiology

## 2019-02-05 MED ORDER — CARVEDILOL 3.125 MG PO TABS: 3.1250 mg | ORAL_TABLET | Freq: Two times a day (BID) | ORAL | 3 refills | Status: DC

## 2019-02-07 ENCOUNTER — Other Ambulatory Visit: Payer: Self-pay | Admitting: Interventional Cardiology

## 2019-02-07 ENCOUNTER — Encounter (HOSPITAL_COMMUNITY): Payer: Medicare Other

## 2019-02-07 ENCOUNTER — Ambulatory Visit (HOSPITAL_COMMUNITY): Payer: Medicare Other

## 2019-02-07 NOTE — Telephone Encounter (Signed)
Pt calling requesting a refill on sildenafil. Would Dr. Tamala Julian like to refill this medication? Please address

## 2019-02-07 NOTE — Telephone Encounter (Signed)
Not responsible for this med.

## 2019-02-10 ENCOUNTER — Encounter (HOSPITAL_COMMUNITY): Payer: Medicare Other

## 2019-02-10 ENCOUNTER — Ambulatory Visit (HOSPITAL_COMMUNITY): Payer: Medicare Other

## 2019-02-12 ENCOUNTER — Encounter (HOSPITAL_COMMUNITY): Payer: Medicare Other | Admitting: Internal Medicine

## 2019-02-12 ENCOUNTER — Encounter (HOSPITAL_COMMUNITY): Payer: Medicare Other

## 2019-02-12 ENCOUNTER — Telehealth: Payer: Self-pay | Admitting: Interventional Cardiology

## 2019-02-12 NOTE — Telephone Encounter (Signed)
Virtual Visit Pre-Appointment Phone Call  "(Name), I am calling you today to discuss your upcoming appointment. We are currently trying to limit exposure to the virus that causes COVID-19 by seeing patients at home rather than in the office."  1. "What is the BEST phone number to call the day of the visit?" - include this in appointment notes  USE HOME PHONE #  2. Do you have or have access to (through a family member/friend) a smartphone with video capability that we can use for your visit?" a. If yes - list this number in appt notes as cell (if different from BEST phone #) and list the appointment type as a VIDEO visit in appointment notes b. If no - list the appointment type as a PHONE visit in appointment notes  3. Confirm consent - "In the setting of the current Covid19 crisis, you are scheduled for a PHONE visit with your provider on 5/13 at 1:40pm.  Just as we do with many in-office visits, in order for you to participate in this visit, we must obtain consent.  If you'd like, I can send this to your mychart (if signed up) or email for you to review.  Otherwise, I can obtain your verbal consent now.  All virtual visits are billed to your insurance company just like a normal visit would be.  By agreeing to a virtual visit, we'd like you to understand that the technology does not allow for your provider to perform an examination, and thus may limit your provider's ability to fully assess your condition. If your provider identifies any concerns that need to be evaluated in person, we will make arrangements to do so.  Finally, though the technology is pretty good, we cannot assure that it will always work on either your or our end, and in the setting of a video visit, we may have to convert it to a phone-only visit.  In either situation, we cannot ensure that we have a secure connection.  Are you willing to proceed?" STAFF: Did the patient verbally acknowledge consent to telehealth visit?  Document YES/NO here: YES  4. Advise patient to be prepared - "Two hours prior to your appointment, go ahead and check your blood pressure, pulse, oxygen saturation, and your weight (if you have the equipment to check those) and write them all down. When your visit starts, your provider will ask you for this information. If you have an Apple Watch or Kardia device, please plan to have heart rate information ready on the day of your appointment. Please have a pen and paper handy nearby the day of the visit as well."  5. Give patient instructions for MyChart download to smartphone OR [email or web address] as below if video visit (depending on what platform provider is using)  6. Inform patient they will receive a phone call 15 minutes prior to their appointment time (may be from unknown caller ID) so they should be prepared to answer    TELEPHONE CALL NOTE  Doris Jefferson has been deemed a candidate for a follow-up tele-health visit to limit community exposure during the Covid-19 pandemic. I spoke with the patient via phone to ensure availability of phone/video source, confirm preferred email & phone number, and discuss instructions and expectations.  I reminded Doris Jefferson to be prepared with any vital sign and/or heart rhythm information that could potentially be obtained via home monitoring, at the time of her visit. I reminded Doris Jefferson to expect a phone  call prior to her visit.  [name], RN 02/12/2019 11:41 AM

## 2019-02-12 NOTE — Telephone Encounter (Signed)
New Message   Patient calling about appointment 5/13 states she can do a telephone visit please call to setup.

## 2019-02-14 ENCOUNTER — Encounter (HOSPITAL_COMMUNITY): Payer: Medicare Other

## 2019-02-14 ENCOUNTER — Ambulatory Visit (HOSPITAL_COMMUNITY): Payer: Medicare Other

## 2019-02-17 ENCOUNTER — Ambulatory Visit (HOSPITAL_COMMUNITY): Payer: Medicare Other

## 2019-02-17 ENCOUNTER — Encounter (HOSPITAL_COMMUNITY): Payer: Medicare Other

## 2019-02-17 NOTE — Progress Notes (Signed)
Virtual Visit via Video Note   This visit type was conducted due to national recommendations for restrictions regarding the COVID-19 Pandemic (e.g. social distancing) in an effort to limit this patient's exposure and mitigate transmission in our community.  Due to her co-morbid illnesses, this patient is at least at moderate risk for complications without adequate follow up.  This format is felt to be most appropriate for this patient at this time.  All issues noted in this document were discussed and addressed.  A limited physical exam was performed with this format.  Please refer to the patient's chart for her consent to telehealth for [facility].   Date:  02/19/2019   ID:  Doris Jefferson, DOB 16-Jan-1941, MRN [id number]  Patient Location: Home Provider Location: Office  PCP:  Aletha Halim., PA-C  Cardiologist:  Sinclair Grooms, MD  Electrophysiologist:  None   Evaluation Performed:  Follow-Up Visit  Chief Complaint:  CAD/AF/AVR  History of Present Illness:    Doris Jefferson is a 78 y.o. female with  a hx of Paroxysmal atrial fibrillation, OSA, hypertension, hyperlipidemia, aortic stenosis, type II DM, and recentAVR(bioprosthesis), LAA clip, and 3 vessel CABG (with LIMA to LAD, SVG to OM, and SVG to RCA) 09/27/18. Required cardioversion and IV amio loading prior to DC (12/30).  She feels well.  She is concerned that blood pressures been higher than prior.  Diastolic is running near or slightly above 90.  She has had no racing or pounding heart.  She denies orthopnea, PND, and swelling.  No bleeding complications on apixaban.  She is off amiodarone now for greater than a month.  The patient does not have symptoms concerning for COVID-19 infection (fever, chills, cough, or new shortness of breath).    Past Medical History:  Diagnosis Date  . Coronary artery disease   . Depression   . GERD (gastroesophageal reflux disease)   . Heart murmur   . HTN (hypertension)   .  Hyperlipidemia   . Iron deficiency anemia   . Obesity (BMI 30-39.9) 03/04/2014  . OSA on CPAP    severe with AHI 31/hr  . PAF (paroxysmal atrial fibrillation) ([place])   . Pneumonia    "twice; years ago" (09/24/2018)  . Skin cancer    "left neck; burned off"  . Type II diabetes mellitus ([place])    Past Surgical History:  Procedure Laterality Date  . ABDOMINAL HYSTERECTOMY    . ANTERIOR CERVICAL DECOMP/DISCECTOMY FUSION     "cage around my neck; Dr. Erline Levine"  . AORTIC VALVE REPLACEMENT N/A 09/27/2018   Procedure: AORTIC VALVE REPLACEMENT (AVR) USING MAGNA EASE SIZE 21 MM;  Surgeon: Grace Isaac, MD;  Location: [place];  Service: Open Heart Surgery;  Laterality: N/A;  . BACK SURGERY    . CARDIAC CATHETERIZATION  09/24/2018  . CARDIOVERSION N/A 12/19/2013   Procedure: CARDIOVERSION;  Surgeon: Sinclair Grooms, MD;  Location: [facility] ENDOSCOPY;  Service: Cardiovascular;  Laterality: N/A;  . CARDIOVERSION N/A 08/16/2015   Procedure: CARDIOVERSION;  Surgeon: Larey Dresser, MD;  Location: [place];  Service: Cardiovascular;  Laterality: N/A;  . CARDIOVERSION N/A 09/13/2015   Procedure: CARDIOVERSION;  Surgeon: Sueanne Margarita, MD;  Location: [facility] ENDOSCOPY;  Service: Cardiovascular;  Laterality: N/A;  . CARDIOVERSION N/A 10/07/2018   Procedure: CARDIOVERSION;  Surgeon: Sanda Klein, MD;  Location: [place];  Service: Cardiovascular;  Laterality: N/A;  . CATARACT EXTRACTION W/ INTRAOCULAR LENS  IMPLANT, BILATERAL Bilateral   .  CLIPPING OF ATRIAL APPENDAGE N/A 09/27/2018   Procedure: CLIPPING OF ATRIAL APPENDAGE USING ATRICURE FLEXV SIZE 35MM;  Surgeon: Grace Isaac, MD;  Location: [place];  Service: Open Heart Surgery;  Laterality: N/A;  . COLONOSCOPY WITH PROPOFOL N/A 11/13/2017   Procedure: COLONOSCOPY WITH PROPOFOL;  Surgeon: Wilford Corner, MD;  Location: [facility] ENDOSCOPY;  Service: Endoscopy;  Laterality: N/A;  ANTIBIOTIC NEEDED BEFORE PROCEDURE  . CORONARY ARTERY BYPASS GRAFT  N/A 09/27/2018   Procedure: CORONARY ARTERY BYPASS GRAFTING (CABG) x three, using left internal mammary artery and right leg greater saphenous vein harvested endoscopically;  Surgeon: Grace Isaac, MD;  Location: [place];  Service: Open Heart Surgery;  Laterality: N/A;  . ESOPHAGOGASTRODUODENOSCOPY (EGD) WITH PROPOFOL N/A 11/13/2017   Procedure: ESOPHAGOGASTRODUODENOSCOPY (EGD) WITH PROPOFOL;  Surgeon: Wilford Corner, MD;  Location: [facility] ENDOSCOPY;  Service: Endoscopy;  Laterality: N/A;  . EYE SURGERY    . RIGHT/LEFT HEART CATH AND CORONARY ANGIOGRAPHY N/A 09/24/2018   Procedure: RIGHT/LEFT HEART CATH AND CORONARY ANGIOGRAPHY;  Surgeon: Jettie Booze, MD;  Location: [place] CV LAB;  Service: Cardiovascular;  Laterality: N/A;  . TEE WITHOUT CARDIOVERSION N/A 09/27/2018   Procedure: TRANSESOPHAGEAL ECHOCARDIOGRAM (TEE);  Surgeon: Grace Isaac, MD;  Location: [place];  Service: Open Heart Surgery;  Laterality: N/A;  . TUBAL LIGATION       Current Meds  Medication Sig  . acetaminophen (TYLENOL) 325 MG tablet Take 650 mg by mouth every 6 (six) hours as needed for moderate pain or headache.  [name] apixaban (ELIQUIS) 5 MG TABS tablet Take 1 tablet (5 mg total) by mouth 2 (two) times daily.  [name] aspirin EC 81 MG EC tablet Take 1 tablet (81 mg total) by mouth daily.  [name] buPROPion (WELLBUTRIN XL) 300 MG 24 hr tablet Take 300 mg by mouth daily.   [name] doxylamine, Sleep, (UNISOM) 25 MG tablet Take 25 mg by mouth at bedtime as needed for sleep.  . fexofenadine (ALLEGRA) 60 MG tablet Take 60 mg by mouth daily as needed for allergies.   . furosemide (LASIX) 20 MG tablet TAKE 1 TABLET BY MOUTH DAILY  . liraglutide (VICTOZA) 18 MG/3ML SOPN Inject 1.8 mg into the skin daily.   [name] loperamide (IMODIUM) 2 MG capsule Take 1 capsule (2 mg total) by mouth as needed for diarrhea or loose stools.  [name] MAGNESIUM-OXIDE 400 (241.3 Mg) MG tablet TAKE 1 TABLET BY MOUTH TWICE DAILY  . metFORMIN (GLUCOPHAGE) 1000 MG  tablet Take 1,000 mg by mouth 2 (two) times a day.  . oxyCODONE (OXY IR/ROXICODONE) 5 MG immediate release tablet Take 1-2 tablets (5-10 mg total) by mouth every 4 (four) hours as needed for severe pain.  [name] Potassium (GNP POTASSIUM) 99 MG TABS Take 99 mg by mouth 3 (three) times daily.   . rosuvastatin (CRESTOR) 20 MG tablet Take 20 mg by mouth daily.  . sildenafil (REVATIO) 20 MG tablet Take 1 tablet (20 mg total) by mouth 3 (three) times daily.  . [DISCONTINUED] carvedilol (COREG) 3.125 MG tablet Take 1 tablet (3.125 mg total) by mouth 2 (two) times daily with a meal.     Allergies:   Minocin [minocycline hcl]; Benazepril; Hydrochlorothiazide; and Tape   Social History   Tobacco Use  . Smoking status: Never Smoker  . Smokeless tobacco: Never Used  Substance Use Topics  . Alcohol use: Never    Frequency: Never  . Drug use: Never     Family Hx: The patient's family history includes Heart  disease in her father; Hypertension in her brother and father; Melanoma in her mother.  ROS:   Please see the history of present illness.    Concerned about blood pressure.  Otherwise no complaints. All other systems reviewed and are negative.   Prior CV studies:   The following studies were reviewed today:  No new cardiac imaging or functional data.  Labs/Other Tests and Data Reviewed:    EKG:  No ECG reviewed.  Recent Labs: 05/08/2018: TSH 3.000 09/28/2018: Magnesium 2.6 09/30/2018: ALT 53 11/05/2018: Hemoglobin 11.7; Platelet Count 256 11/15/2018: BUN 14; Creatinine, Ser 1.24; Potassium 4.2; Sodium 140   Recent Lipid Panel Lab Results  Component Value Date/Time   CHOL 145 05/08/2018 08:20 AM   TRIG 152 (H) 05/08/2018 08:20 AM   HDL 43 05/08/2018 08:20 AM   CHOLHDL 3.4 05/08/2018 08:20 AM   LDLCALC 72 05/08/2018 08:20 AM    Wt Readings from Last 3 Encounters:  02/19/19 180 lb 3.2 oz (81.7 kg)  12/12/18 181 lb 9.6 oz (82.4 kg)  12/12/18 181 lb 3.5 oz (82.2 kg)     Objective:     Vital Signs:  BP (!) 149/60   Pulse 73   Ht 5\' 3"  (1.6 m)   Wt 180 lb 3.2 oz (81.7 kg)   LMP  (LMP Unknown)   BMI 31.92 kg/m    VITAL SIGNS:  reviewed GEN:  no acute distress RESPIRATORY:  normal respiratory effort, symmetric expansion CARDIOVASCULAR:  no peripheral edema NEURO:  alert and oriented x 3, no obvious focal deficit  ASSESSMENT & PLAN:    1. Persistent atrial fibrillation   2. Chronic diastolic heart failure ([place])   3. OSA (obstructive sleep apnea)   4. Essential hypertension   5. Coronary artery disease of bypass graft of native heart with stable angina pectoris ([place])   6. Diabetes mellitus type 2 in obese (H[facility])   7. Other hyperlipidemia   8. Educated About Covid-19 Virus Infection    PLAN:  1. Presumed to be in sinus rhythm.  She will wear a 2-week monitor to rule out A. fib.  Amiodarone has been discontinued.  If no A. fib, discontinue apixaban. 2. No clinical evidence of volume overload 3. Compliance with CPAP mask is advocated 4. Increase carvedilol to 6.25 mg twice daily for better blood pressure control, target less than 130/80 mmHg. 5. Secondary risk prevention discussed briefly. 6. Target A1c less than 7. 7. Target LDL less than 70.  COVID-19 Education: The signs and symptoms of COVID-19 were discussed with the patient and how to seek care for testing (follow up with PCP or arrange E-visit).  The importance of social distancing was discussed today.  Time:   Today, I have spent 15 minutes with the patient with telehealth technology discussing the above problems.     Medication Adjustments/Labs and Tests Ordered: Current medicines are reviewed at length with the patient today.  Concerns regarding medicines are outlined above.   Tests Ordered: Orders Placed This Encounter  Procedures  . LONG TERM MONITOR (3-14 DAYS)    Medication Changes: Meds ordered this encounter  Medications  . carvedilol (COREG) 6.25 MG tablet    Sig: Take 1 tablet  (6.25 mg total) by mouth 2 (two) times daily.    Dispense:  180 tablet    Refill:  3    Dose change    Disposition:  Follow up in 3 month(s)  Signed, Sinclair Grooms, MD  02/19/2019 1:48 PM  [facility]

## 2019-02-19 ENCOUNTER — Ambulatory Visit (HOSPITAL_COMMUNITY): Payer: Medicare Other

## 2019-02-19 ENCOUNTER — Encounter: Payer: Self-pay | Admitting: Interventional Cardiology

## 2019-02-19 ENCOUNTER — Telehealth (INDEPENDENT_AMBULATORY_CARE_PROVIDER_SITE_OTHER): Payer: Medicare Other | Admitting: Interventional Cardiology

## 2019-02-19 ENCOUNTER — Telehealth: Payer: Self-pay | Admitting: Radiology

## 2019-02-19 ENCOUNTER — Other Ambulatory Visit: Payer: Self-pay

## 2019-02-19 ENCOUNTER — Encounter (HOSPITAL_COMMUNITY): Payer: Medicare Other

## 2019-02-19 VITALS — BP 149/60 | HR 73 | Ht 63.0 in | Wt 180.2 lb

## 2019-02-19 DIAGNOSIS — E669 Obesity, unspecified: Secondary | ICD-10-CM

## 2019-02-19 DIAGNOSIS — E1169 Type 2 diabetes mellitus with other specified complication: Secondary | ICD-10-CM

## 2019-02-19 DIAGNOSIS — I4819 Other persistent atrial fibrillation: Secondary | ICD-10-CM

## 2019-02-19 DIAGNOSIS — E7849 Other hyperlipidemia: Secondary | ICD-10-CM

## 2019-02-19 DIAGNOSIS — I25708 Atherosclerosis of coronary artery bypass graft(s), unspecified, with other forms of angina pectoris: Secondary | ICD-10-CM

## 2019-02-19 DIAGNOSIS — Z7189 Other specified counseling: Secondary | ICD-10-CM

## 2019-02-19 DIAGNOSIS — I10 Essential (primary) hypertension: Secondary | ICD-10-CM

## 2019-02-19 DIAGNOSIS — G4733 Obstructive sleep apnea (adult) (pediatric): Secondary | ICD-10-CM

## 2019-02-19 DIAGNOSIS — I5032 Chronic diastolic (congestive) heart failure: Secondary | ICD-10-CM

## 2019-02-19 MED ORDER — CARVEDILOL 6.25 MG PO TABS: 6.2500 mg | ORAL_TABLET | Freq: Two times a day (BID) | ORAL | 3 refills | Status: DC

## 2019-02-19 NOTE — Patient Instructions (Signed)
Medication Instructions:  1) INCREASE Carvedilol to 6.25mg  twice daily  If you need a refill on your cardiac medications before your next appointment, please call your pharmacy.   Lab work: None If you have labs (blood work) drawn today and your tests are completely normal, you will receive your results only by: [name] MyChart Message (if you have MyChart) OR . A paper copy in the mail If you have any lab test that is abnormal or we need to change your treatment, we will call you to review the results.  Testing/Procedures: Your physician has recommended that you wear an event monitor. Event monitors are medical devices that record the heart's electrical activity. Doctors most often [place] these monitors to diagnose arrhythmias. Arrhythmias are problems with the speed or rhythm of the heartbeat. The monitor is a small, portable device. You can wear one while you do your normal daily activities. This is usually used to diagnose what is causing palpitations/syncope (passing out).   Follow-Up: At [facility], you and your health needs are our priority.  As part of our continuing mission to provide you with exceptional heart care, we have created designated Provider Care Teams.  These Care Teams include your primary Cardiologist (physician) and Advanced Practice Providers (APPs -  Physician Assistants and Nurse Practitioners) who all work together to provide you with the care you need, when you need it. You will need a follow up appointment in 3 months.  Please call our office 2 months in advance to schedule this appointment.  You may see Sinclair Grooms, MD or one of the following Advanced Practice Providers on your designated Care Team:   Truitt Merle, NP Cecilie Kicks, NP . Kathyrn Drown, NP  Any Other Special Instructions Will Be Listed Below (If Applicable).

## 2019-02-19 NOTE — Telephone Encounter (Signed)
Enrolled patient for a 14 Day Zio monitor to be mailed. Brief instructions were gone over with the patient and she knows to expect the monitor to arrive in 3-4 days

## 2019-02-21 ENCOUNTER — Encounter (HOSPITAL_COMMUNITY): Payer: Medicare Other

## 2019-02-21 ENCOUNTER — Ambulatory Visit (HOSPITAL_COMMUNITY): Payer: Medicare Other

## 2019-02-24 ENCOUNTER — Ambulatory Visit (HOSPITAL_COMMUNITY): Payer: Medicare Other

## 2019-02-24 ENCOUNTER — Encounter (HOSPITAL_COMMUNITY): Payer: Medicare Other

## 2019-02-25 ENCOUNTER — Ambulatory Visit (INDEPENDENT_AMBULATORY_CARE_PROVIDER_SITE_OTHER): Payer: Medicare Other

## 2019-02-25 DIAGNOSIS — I4819 Other persistent atrial fibrillation: Secondary | ICD-10-CM

## 2019-02-26 ENCOUNTER — Ambulatory Visit (HOSPITAL_COMMUNITY): Payer: Medicare Other

## 2019-02-26 ENCOUNTER — Encounter (HOSPITAL_COMMUNITY): Payer: Medicare Other

## 2019-02-28 ENCOUNTER — Encounter (HOSPITAL_COMMUNITY): Payer: Medicare Other

## 2019-02-28 ENCOUNTER — Ambulatory Visit (HOSPITAL_COMMUNITY): Payer: Medicare Other

## 2019-03-04 ENCOUNTER — Telehealth (HOSPITAL_COMMUNITY): Payer: Self-pay | Admitting: *Deleted

## 2019-03-04 ENCOUNTER — Encounter (HOSPITAL_COMMUNITY): Payer: Self-pay | Admitting: *Deleted

## 2019-03-04 DIAGNOSIS — Z951 Presence of aortocoronary bypass graft: Secondary | ICD-10-CM

## 2019-03-04 DIAGNOSIS — Z952 Presence of prosthetic heart valve: Secondary | ICD-10-CM

## 2019-03-04 NOTE — Progress Notes (Signed)
Discharge Progress Report  Patient Details  Name: Doris Jefferson MRN: [id number] Date of Birth: Feb 09, 1941 Referring Provider:     CARDIAC REHAB PHASE II ORIENTATION from 12/12/2018 in [place]  Referring Provider  Belva Crome, MD        Number of Visits: 3  Reason for Discharge:  Early Exit:  Program closed due to [place] 19 pandemic. Patient exercising on their own  Smoking History:  Social History   Tobacco Use  Smoking Status Never Smoker  Smokeless Tobacco Never Used    Diagnosis:  S/P CABG x 3, 09/27/18  S/P AVR (aortic valve replacement), 09/27/18  ADL UCSD:   Initial Exercise Prescription:   Discharge Exercise Prescription (Final Exercise Prescription Changes):   Functional Capacity:   Psychological, QOL, Others - Outcomes: PHQ 2/9: Depression screen [facility] 2/9 12/18/2018 07/17/2015  Decreased Interest 0 0  Down, Depressed, Hopeless 0 0  PHQ - 2 Score 0 0    Quality of Life:   Personal Goals: Goals established at orientation with interventions provided to work toward goal.    Personal Goals Discharge:   Exercise Goals and Review:   Exercise Goals Re-Evaluation:   Nutrition & Weight - Outcomes:    Nutrition:   Nutrition Discharge:   Education Ques:  03/04/19 [name] attended 3 exercise sessions between 12/12/18-12/20/18. Cardiac rehab has been closed due to the [place] 19 pandemic. [name] says she is walking at home and can walk up and down stairs without feeling short of breath. [name] was offered virtual cardiac rehab. [name] declined and has decided she will exercise on her own and not resume cardiac rehab when the program reopens.Barnet Pall, RN,BSN 03/04/2019 1:39 PM

## 2019-03-05 ENCOUNTER — Ambulatory Visit (HOSPITAL_COMMUNITY): Payer: Medicare Other

## 2019-03-05 ENCOUNTER — Encounter (HOSPITAL_COMMUNITY): Payer: Medicare Other

## 2019-03-07 ENCOUNTER — Encounter (HOSPITAL_COMMUNITY): Payer: Medicare Other

## 2019-03-07 ENCOUNTER — Ambulatory Visit (HOSPITAL_COMMUNITY): Payer: Medicare Other

## 2019-03-10 ENCOUNTER — Encounter (HOSPITAL_COMMUNITY): Payer: BLUE CROSS/BLUE SHIELD

## 2019-03-10 ENCOUNTER — Ambulatory Visit (HOSPITAL_COMMUNITY): Payer: Medicare Other

## 2019-03-12 ENCOUNTER — Ambulatory Visit (HOSPITAL_COMMUNITY): Payer: Medicare Other

## 2019-03-12 ENCOUNTER — Encounter (HOSPITAL_COMMUNITY): Payer: BLUE CROSS/BLUE SHIELD

## 2019-03-14 ENCOUNTER — Ambulatory Visit (HOSPITAL_COMMUNITY): Payer: Medicare Other

## 2019-03-14 ENCOUNTER — Encounter (HOSPITAL_COMMUNITY): Payer: BLUE CROSS/BLUE SHIELD

## 2019-03-17 ENCOUNTER — Encounter (HOSPITAL_COMMUNITY): Payer: BLUE CROSS/BLUE SHIELD

## 2019-03-17 ENCOUNTER — Ambulatory Visit (HOSPITAL_COMMUNITY): Payer: Medicare Other

## 2019-03-19 ENCOUNTER — Encounter (HOSPITAL_COMMUNITY): Payer: BLUE CROSS/BLUE SHIELD

## 2019-03-19 ENCOUNTER — Ambulatory Visit (HOSPITAL_COMMUNITY): Payer: Medicare Other

## 2019-03-21 ENCOUNTER — Other Ambulatory Visit: Payer: Self-pay

## 2019-03-21 ENCOUNTER — Ambulatory Visit (HOSPITAL_COMMUNITY): Payer: Medicare Other

## 2019-03-21 ENCOUNTER — Encounter (HOSPITAL_COMMUNITY): Payer: BLUE CROSS/BLUE SHIELD

## 2019-03-26 ENCOUNTER — Telehealth: Payer: Self-pay | Admitting: *Deleted

## 2019-03-26 NOTE — Telephone Encounter (Signed)
-----   Message from Belva Crome, MD sent at 03/25/2019  5:03 PM EDT ----- Let the patient know there is no AF. Okay to stop Apixiban and start Aspirin 81 mg daily. A copy will be sent to Aletha Halim., PA-C

## 2019-03-26 NOTE — Telephone Encounter (Signed)
Spoke with pt and went over results and recommendations.  Pt verbalized understanding and was in agreement with this plan.

## 2019-04-01 ENCOUNTER — Other Ambulatory Visit: Payer: Self-pay | Admitting: Interventional Cardiology

## 2019-04-01 NOTE — Telephone Encounter (Signed)
Pt last saw Dr Tamala Julian 02/19/19 telemedicine Covid-19, last labs 11/15/18 Creat 1.24, age 78, weight 82.4kg, based on specified criteria pt is on appropriate dosage of Eliquis 5mg  BID.  Will refill rx.

## 2019-04-25 ENCOUNTER — Encounter (HOSPITAL_COMMUNITY): Payer: Medicare Other | Admitting: Internal Medicine

## 2019-05-05 ENCOUNTER — Other Ambulatory Visit: Payer: Self-pay | Admitting: *Deleted

## 2019-05-05 DIAGNOSIS — D509 Iron deficiency anemia, unspecified: Secondary | ICD-10-CM

## 2019-05-06 ENCOUNTER — Other Ambulatory Visit: Payer: Self-pay

## 2019-05-06 ENCOUNTER — Telehealth: Payer: Self-pay | Admitting: Oncology

## 2019-05-06 ENCOUNTER — Inpatient Hospital Stay: Payer: Medicare Other

## 2019-05-06 ENCOUNTER — Inpatient Hospital Stay: Payer: Medicare Other | Attending: Oncology | Admitting: Oncology

## 2019-05-06 VITALS — BP 140/72 | HR 82 | Temp 98.5°F | Resp 18 | Ht 63.0 in | Wt 179.1 lb

## 2019-05-06 DIAGNOSIS — D509 Iron deficiency anemia, unspecified: Secondary | ICD-10-CM

## 2019-05-06 LAB — IRON AND TIBC
Iron: 50 ug/dL (ref 41–142)
Saturation Ratios: 13 % — ABNORMAL LOW (ref 21–57)
TIBC: 383 ug/dL (ref 236–444)
UIBC: 333 ug/dL (ref 120–384)

## 2019-05-06 LAB — CBC WITH DIFFERENTIAL (CANCER CENTER ONLY)
Abs Immature Granulocytes: 0.02 10*3/uL (ref 0.00–0.07)
Basophils Absolute: 0 10*3/uL (ref 0.0–0.1)
Basophils Relative: 0 %
Eosinophils Absolute: 0.2 10*3/uL (ref 0.0–0.5)
Eosinophils Relative: 3 %
HCT: 35.5 % — ABNORMAL LOW (ref 36.0–46.0)
Hemoglobin: 10.6 g/dL — ABNORMAL LOW (ref 12.0–15.0)
Immature Granulocytes: 0 %
Lymphocytes Relative: 29 %
Lymphs Abs: 2.2 10*3/uL (ref 0.7–4.0)
MCH: 26.4 pg (ref 26.0–34.0)
MCHC: 29.9 g/dL — ABNORMAL LOW (ref 30.0–36.0)
MCV: 88.3 fL (ref 80.0–100.0)
Monocytes Absolute: 0.6 10*3/uL (ref 0.1–1.0)
Monocytes Relative: 8 %
Neutro Abs: 4.5 10*3/uL (ref 1.7–7.7)
Neutrophils Relative %: 60 %
Platelet Count: 169 10*3/uL (ref 150–400)
RBC: 4.02 MIL/uL (ref 3.87–5.11)
RDW: 14.1 % (ref 11.5–15.5)
WBC Count: 7.5 10*3/uL (ref 4.0–10.5)
nRBC: 0 % (ref 0.0–0.2)

## 2019-05-06 LAB — FERRITIN: Ferritin: 26 ng/mL (ref 11–307)

## 2019-05-06 NOTE — Progress Notes (Signed)
Hematology and Oncology Follow Up Visit  Doris Jefferson [id number] 28-Jul-1941 78 y.o. 05/06/2019 8:42 AM Aletha Halim., PA-CKaplan, Baldemar Friday., PA-C   Principle Diagnosis: 78 year old woman with anemia related to iron deficiency without any active bleeding noted in 2019.     Prior Therapy:  Iron infusion completed in August 2019.  Current therapy: Repeat iron infusion as needed.  Interim History: Doris Jefferson is here for a follow-up.  Since the last visit, she denies any recent complications or illnesses.  She denies any excessive fatigue, tiredness or dyspnea on exertion.  She denies any palpitation or hospitalization.  She did receive intravenous iron a year ago with resolution of her fatigue symptoms back then.  She denied any alteration mental status, neuropathy, confusion or dizziness.  Denies any headaches or lethargy.  Denies any night sweats, weight loss or changes in appetite.  Denied orthopnea, dyspnea on exertion or chest discomfort.  Denies shortness of breath, difficulty breathing hemoptysis or cough.  Denies any abdominal distention, nausea, early satiety or dyspepsia.  Denies any hematuria, frequency, dysuria or nocturia.  Denies any skin irritation, dryness or rash.  Denies any ecchymosis or petechiae.  Denies any lymphadenopathy or clotting.  Denies any heat or cold intolerance.  Denies any anxiety or depression.  Remaining review of system is negative.        Medications: Updated on review.  She is no longer taking Eliquis. Current Outpatient Medications  Medication Sig Dispense Refill  . acetaminophen (TYLENOL) 325 MG tablet Take 650 mg by mouth every 6 (six) hours as needed for moderate pain or headache.    [name] aspirin EC 81 MG EC tablet Take 1 tablet (81 mg total) by mouth daily.    [name] buPROPion (WELLBUTRIN XL) 300 MG 24 hr tablet Take 300 mg by mouth daily.     . carvedilol (COREG) 6.25 MG tablet Take 1 tablet (6.25 mg total) by mouth 2 (two) times daily. 180 tablet  3  . doxylamine, Sleep, (UNISOM) 25 MG tablet Take 25 mg by mouth at bedtime as needed for sleep.    [name] ELIQUIS 5 MG TABS tablet TAKE 1 TABLET(5 MG) BY MOUTH TWICE DAILY 60 tablet 7  . fexofenadine (ALLEGRA) 60 MG tablet Take 60 mg by mouth daily as needed for allergies.     . furosemide (LASIX) 20 MG tablet TAKE 1 TABLET BY MOUTH DAILY 90 tablet 2  . liraglutide (VICTOZA) 18 MG/3ML SOPN Inject 1.8 mg into the skin daily.     [name] loperamide (IMODIUM) 2 MG capsule Take 1 capsule (2 mg total) by mouth as needed for diarrhea or loose stools. 30 capsule 0  . MAGNESIUM-OXIDE 400 (241.3 Mg) MG tablet TAKE 1 TABLET BY MOUTH TWICE DAILY 180 tablet 3  . metFORMIN (GLUCOPHAGE) 1000 MG tablet Take 1,000 mg by mouth 2 (two) times a day.    . oxyCODONE (OXY IR/ROXICODONE) 5 MG immediate release tablet Take 1-2 tablets (5-10 mg total) by mouth every 4 (four) hours as needed for severe pain. 30 tablet 0  . Potassium (GNP POTASSIUM) 99 MG TABS Take 99 mg by mouth 3 (three) times daily.     . rosuvastatin (CRESTOR) 20 MG tablet Take 20 mg by mouth daily.    . sildenafil (REVATIO) 20 MG tablet Take 1 tablet (20 mg total) by mouth 3 (three) times daily. 90 tablet 3   No current facility-administered medications for this visit.      Allergies:  Allergies  Allergen Reactions  .  Minocin [Minocycline Hcl] Swelling and Other (See Comments)    THROAT SWELLING  . Benazepril Cough  . Hydrochlorothiazide Itching  . Tape Rash    Past Medical History, Surgical history, Social history, and Family History without any changes on review.   Physical Exam: Blood pressure 140/72, pulse 82, temperature 98.5 F (36.9 C), temperature source Oral, resp. rate 18, height 5\' 3"  (1.6 m), weight 179 lb 1.6 oz (81.2 kg), SpO2 99 %.   ECOG: 1    General appearance: Alert, awake without any distress. Head: Atraumatic without abnormalities Oropharynx: Without any thrush or ulcers. Eyes: No scleral icterus. Lymph nodes: No  lymphadenopathy noted in the cervical, supraclavicular, or axillary nodes Heart:regular rate and rhythm, without any murmurs or gallops.   Lung: Clear to auscultation without any rhonchi, wheezes or dullness to percussion. Abdomin: Soft, nontender without any shifting dullness or ascites. Musculoskeletal: No clubbing or cyanosis. Neurological: No motor or sensory deficits. Skin: No rashes or lesions.      Lab Results: Lab Results  Component Value Date   WBC 7.5 05/06/2019   HGB 10.6 (L) 05/06/2019   HCT 35.5 (L) 05/06/2019   MCV 88.3 05/06/2019   PLT 169 05/06/2019     Chemistry      Component Value Date/Time   NA 140 11/15/2018 0937   NA 144 09/19/2018 1053   K 4.2 11/15/2018 0937   CL 105 11/15/2018 0937   CO2 24 11/15/2018 0937   BUN 14 11/15/2018 0937   BUN 15 09/19/2018 1053   CREATININE 1.24 (H) 11/15/2018 0937   CREATININE 1.00 (H) 09/06/2015 1051      Component Value Date/Time   CALCIUM 9.2 11/15/2018 0937   ALKPHOS 58 09/30/2018 0533   AST 30 09/30/2018 0533   ALT 53 (H) 09/30/2018 0533   BILITOT 0.7 09/30/2018 0533   BILITOT 0.5 05/08/2018 0820       Impression and Plan:  78 year old woman with the following:  1.    Iron deficiency anemia diagnosed in July 2019.  Her work-up did not reveal any GI blood losses.  She is status post intravenous iron in the past with normalization of her iron studies and hemoglobin.  Her hemoglobin today is slightly lower although she is asymptomatic.  Iron studies are currently pending and will be repeated periodically.  Other causes for her anemia could be renal insufficiency among others.  Risks and benefits of repeat intravenous iron if needed in the future were reviewed.  These include arthralgias, myalgias and rarely anaphylaxis.  Growth factor support will be considered if her anemia persists despite normal iron studies.  2.  Follow-up: In 6 months for repeat evaluation.  15  minutes was spent with the patient  face-to-face today.  More than 50% of time was spent on updating her disease status, differential diagnosis and treatment options.     Zola Button, MD 7/28/20208:42 AM

## 2019-05-06 NOTE — Telephone Encounter (Signed)
Scheduled appt per 7/28 sch message/los - reminder letter mailed and left message message with appt date and time

## 2019-05-15 ENCOUNTER — Telehealth: Payer: Self-pay | Admitting: Cardiology

## 2019-05-15 NOTE — Telephone Encounter (Signed)
New Message   Patient's husband calling in because the patient has been having trouble with her sleep apnea and the cpap machine. It is making a lot of noise throughout the night. Patient and her husband would like a call back to discuss.

## 2019-05-16 NOTE — Telephone Encounter (Addendum)
Spoke to [employer] per dpr and encouraged him to call his wifes dme and speak to one of the RT's for help. Pt is agreeable to treatment.

## 2019-05-22 ENCOUNTER — Encounter (HOSPITAL_COMMUNITY): Payer: Self-pay | Admitting: Internal Medicine

## 2019-05-22 ENCOUNTER — Ambulatory Visit (HOSPITAL_COMMUNITY)
Admission: RE | Admit: 2019-05-22 | Discharge: 2019-05-22 | Disposition: A | Discharge status: Home / Self Care | Payer: Medicare Other | Source: Ambulatory Visit | Attending: Internal Medicine | Admitting: Internal Medicine

## 2019-05-22 ENCOUNTER — Other Ambulatory Visit: Payer: Self-pay

## 2019-05-22 VITALS — BP 132/79 | HR 84 | Wt 180.8 lb

## 2019-05-22 DIAGNOSIS — Z85828 Personal history of other malignant neoplasm of skin: Secondary | ICD-10-CM | POA: Insufficient documentation

## 2019-05-22 DIAGNOSIS — Z79899 Other long term (current) drug therapy: Secondary | ICD-10-CM | POA: Insufficient documentation

## 2019-05-22 DIAGNOSIS — E1169 Type 2 diabetes mellitus with other specified complication: Secondary | ICD-10-CM | POA: Diagnosis not present

## 2019-05-22 DIAGNOSIS — Z8249 Family history of ischemic heart disease and other diseases of the circulatory system: Secondary | ICD-10-CM | POA: Diagnosis not present

## 2019-05-22 DIAGNOSIS — N183 Chronic kidney disease, stage 3 (moderate): Secondary | ICD-10-CM | POA: Insufficient documentation

## 2019-05-22 DIAGNOSIS — I5032 Chronic diastolic (congestive) heart failure: Secondary | ICD-10-CM

## 2019-05-22 DIAGNOSIS — I2510 Atherosclerotic heart disease of native coronary artery without angina pectoris: Secondary | ICD-10-CM

## 2019-05-22 DIAGNOSIS — Z7982 Long term (current) use of aspirin: Secondary | ICD-10-CM | POA: Insufficient documentation

## 2019-05-22 DIAGNOSIS — G4733 Obstructive sleep apnea (adult) (pediatric): Secondary | ICD-10-CM

## 2019-05-22 DIAGNOSIS — I10 Essential (primary) hypertension: Secondary | ICD-10-CM

## 2019-05-22 DIAGNOSIS — I480 Paroxysmal atrial fibrillation: Secondary | ICD-10-CM | POA: Diagnosis not present

## 2019-05-22 DIAGNOSIS — Z952 Presence of prosthetic heart valve: Secondary | ICD-10-CM | POA: Diagnosis not present

## 2019-05-22 DIAGNOSIS — Z951 Presence of aortocoronary bypass graft: Secondary | ICD-10-CM | POA: Insufficient documentation

## 2019-05-22 DIAGNOSIS — E669 Obesity, unspecified: Secondary | ICD-10-CM | POA: Insufficient documentation

## 2019-05-22 DIAGNOSIS — Z7984 Long term (current) use of oral hypoglycemic drugs: Secondary | ICD-10-CM | POA: Diagnosis not present

## 2019-05-22 DIAGNOSIS — K219 Gastro-esophageal reflux disease without esophagitis: Secondary | ICD-10-CM | POA: Diagnosis not present

## 2019-05-22 DIAGNOSIS — E785 Hyperlipidemia, unspecified: Secondary | ICD-10-CM | POA: Insufficient documentation

## 2019-05-22 DIAGNOSIS — I272 Pulmonary hypertension, unspecified: Secondary | ICD-10-CM | POA: Diagnosis not present

## 2019-05-22 DIAGNOSIS — E1122 Type 2 diabetes mellitus with diabetic chronic kidney disease: Secondary | ICD-10-CM | POA: Diagnosis not present

## 2019-05-22 DIAGNOSIS — I130 Hypertensive heart and chronic kidney disease with heart failure and stage 1 through stage 4 chronic kidney disease, or unspecified chronic kidney disease: Secondary | ICD-10-CM | POA: Diagnosis not present

## 2019-05-22 MED ORDER — JARDIANCE 10 MG PO TABS: 10.0000 mg | ORAL_TABLET | Freq: Every day | ORAL | 6 refills | Status: DC

## 2019-05-22 NOTE — Patient Instructions (Signed)
Start Jardiance 10 mg daily  Your physician has requested that you have an echocardiogram. Echocardiography is a painless test that uses sound waves to create images of your heart. It provides your doctor with information about the size and shape of your heart and how well your heart's chambers and valves are working. This procedure takes approximately one hour. There are no restrictions for this procedure.  We will contact you in 9 months to schedule your next appointment.  At the [facility], you and your health needs are our priority. As part of our continuing mission to provide you with exceptional heart care, we have created designated Provider Care Teams. These Care Teams include your primary Cardiologist (physician) and Advanced Practice Providers (APPs- Physician Assistants and Nurse Practitioners) who all work together to provide you with the care you need, when you need it.   You may see any of the following providers on your designated Care Team at your next follow up: [name] Dr Glori Bickers . Dr [name] . Darrick Grinder, NP   Please be sure to bring in all your medications bottles to every appointment.

## 2019-05-22 NOTE — Progress Notes (Signed)
Advanced Heart Failure Clinic Note   PCP: Aletha Halim., PA-C PCP-Cardiologist: Sinclair Grooms, MD  CT Surgery: Dr [name] HF MD: Dr Haroldine Laws  HPI: Doris Jefferson is a 78 y.o. female with chronic diastolic CHF, PAH, HTN, CAD, PAF, h/o OSA on CPAP, DM2, HLD, moderate AS, and obesity.   Cath 09/24/18 Found to have severe 3 v disease and moderate pulmonary HTN and admitted for CABG consideration.  Started on milrinone pre-operatively for optimization of pulmonary pressures. Also started on sildenafil. She underwent CABG x 3 (with LIMA to LAD, SVG to OM, and SVG to RCA) with bioprosthetic AVR and LAA clip on 12/20. Milrinone weaned off. Underwent DCCV 12/30 for post op Afib.   Today she returns for HF follow up. Doing quite well. Dr. Tamala Julian has stopped Eliquis and amio as no AF recurrence post-op. Does all ADLs without problem. No CP or SOB. No palpitations. Wears CPAP every night. Diabetes well controlled.    Echo 12/19: 50-55%, trivial AI with mean gradient of 11 mmHg, trivial MR, RV mod reduced, mild to mod TR  [facility] 09/24/18  Ost RCA to Prox RCA lesion is 75% stenosed.  Ost Cx to Prox Cx lesion is 100% stenosed.  Dist LM lesion is 80% stenosed.  Ost 1st Diag lesion is 75% stenosed.  Ost Ramus lesion is 75% stenosed.  Mildto moderateaortic stenosis (Mean gradient 20 mm Hg) Prox LAD lesion is 50% stenosed. Hemodynamics (mmHg) RA mean 8 RV 76/1 PA 76/25 (45) PCWP 16 AO 146/55 LV 166/12 PVR 4.9 WU Cardiac Output (Fick) 5.86 Cardiac Index (Fick) 2.99  Review of systems complete and found to be negative unless listed in HPI.    Past Medical History:  Diagnosis Date  . Coronary artery disease   . Depression   . GERD (gastroesophageal reflux disease)   . Heart murmur   . HTN (hypertension)   . Hyperlipidemia   . Iron deficiency anemia   . Obesity (BMI 30-39.9) 03/04/2014  . OSA on CPAP    severe with AHI 31/hr  . PAF (paroxysmal atrial fibrillation) ([place])    . Pneumonia    "twice; years ago" (09/24/2018)  . Skin cancer    "left neck; burned off"  . Type II diabetes mellitus ([place])    Current Outpatient Medications  Medication Sig Dispense Refill  . acetaminophen (TYLENOL) 325 MG tablet Take 650 mg by mouth every 6 (six) hours as needed for moderate pain or headache.    [name] aspirin EC 81 MG EC tablet Take 1 tablet (81 mg total) by mouth daily.    [name] buPROPion (WELLBUTRIN XL) 300 MG 24 hr tablet Take 300 mg by mouth daily.     . carvedilol (COREG) 6.25 MG tablet Take 1 tablet (6.25 mg total) by mouth 2 (two) times daily. 180 tablet 3  . fexofenadine (ALLEGRA) 60 MG tablet Take 60 mg by mouth daily as needed for allergies.     . furosemide (LASIX) 20 MG tablet TAKE 1 TABLET BY MOUTH DAILY 90 tablet 2  . liraglutide (VICTOZA) 18 MG/3ML SOPN Inject 1.8 mg into the skin daily.     [name] loperamide (IMODIUM) 2 MG capsule Take 1 capsule (2 mg total) by mouth as needed for diarrhea or loose stools. 30 capsule 0  . MAGNESIUM-OXIDE 400 (241.3 Mg) MG tablet TAKE 1 TABLET BY MOUTH TWICE DAILY 180 tablet 3  . metFORMIN (GLUCOPHAGE) 1000 MG tablet Take 1,000 mg by mouth 2 (two) times a day.    [name]  Potassium (GNP POTASSIUM) 99 MG TABS Take 99 mg by mouth 3 (three) times daily.     . rosuvastatin (CRESTOR) 20 MG tablet Take 20 mg by mouth daily.    . sildenafil (REVATIO) 20 MG tablet Take 1 tablet (20 mg total) by mouth 3 (three) times daily. 90 tablet 3  . oxyCODONE (OXY IR/ROXICODONE) 5 MG immediate release tablet Take 1-2 tablets (5-10 mg total) by mouth every 4 (four) hours as needed for severe pain. (Patient not taking: Reported on 05/22/2019) 30 tablet 0   No current facility-administered medications for this encounter.    Allergies  Allergen Reactions  . Minocin [Minocycline Hcl] Swelling and Other (See Comments)    THROAT SWELLING  . Benazepril Cough  . Hydrochlorothiazide Itching  . Tape Rash   Social History   Socioeconomic History  . Marital status:  Married    Spouse name: Thomsa  . Number of children: Not on file  . Years of education: 52  . Highest education level: High school graduate  Occupational History  . Occupation: Retired  Scientific laboratory technician  . Financial resource strain: Not hard at all  . Food insecurity    Worry: Never true    Inability: Never true  . Transportation needs    Medical: No    Non-medical: No  Tobacco Use  . Smoking status: Never Smoker  . Smokeless tobacco: Never Used  Substance and Sexual Activity  . Alcohol use: Never    Frequency: Never  . Drug use: Never  . Sexual activity: Not Currently  Lifestyle  . Physical activity    Days per week: 3 days    Minutes per session: 10 min  . Stress: Not at all  Relationships  . Social Herbalist on phone: Not on file    Gets together: Not on file    Attends religious service: Not on file    Active member of club or organization: Not on file    Attends meetings of clubs or organizations: Not on file    Relationship status: Not on file  . Intimate partner violence    Fear of current or ex partner: Not on file    Emotionally abused: Not on file    Physically abused: Not on file    Forced sexual activity: Not on file  Other Topics Concern  . Not on file  Social History Narrative  . Not on file    Family History  Problem Relation Age of Onset  . Hypertension Father   . Heart disease Father   . Melanoma Mother   . Hypertension Brother    Vitals:   05/22/19 0940  BP: 132/79  Pulse: 84  SpO2: 98%  Weight: 82 kg (180 lb 12.8 oz)    Wt Readings from Last 3 Encounters:  05/22/19 82 kg (180 lb 12.8 oz)  05/06/19 81.2 kg (179 lb 1.6 oz)  02/19/19 81.7 kg (180 lb 3.2 oz)    PHYSICAL EXAM: General:  Well appearing. No resp difficulty HEENT: normal Neck: supple. JVP 8. Carotids 2+ bilat; no bruits. No lymphadenopathy or thryomegaly appreciated. Cor: PMI nondisplaced. Regular rate & rhythm. Soft SEM RUSB S2 crisp Lungs: clear Abdomen:  obese soft, nontender, nondistended. No hepatosplenomegaly. No bruits or masses. Good bowel sounds. Extremities: no cyanosis, clubbing, rash, trace edema Neuro: alert & orientedx3, cranial nerves grossly intact. moves all 4 extremities w/o difficulty. Affect pleasant   ASSESSMENT & PLAN:  1. Chronic diastolic HF -ELFY10/1/75 LVEF 55-60%,  Grade 2 DD, Mild/Mod AS, Mod MR, mild LAE, RV mild dilated, Mild TR, PA peak pressure 79 mm Hg.  - Echo post-CABG 12/26: 50-55%, trivial AI with mean gradient of 11 mmHg, trivial MR, RV mod reduced, mild to mod TR - Doing very well. NYHA II. Volume status stable. Continue current dose of lasix. May be able to stop with addition of Jardiance - repeat echo   2. . Moderate Pulmonary HTN - By cath 12/19 PA76/25 mm/Hg (Mean 45)  - Likely WHO Group II/III Suspect unlikely to benefit from pulmonary vasodilators. - PA systolics were [id number] before swan pulled. Echo with evidence of RV overload. - Continue sildenafil 20 mg TID.  - will repeat echo. Continue sildenafil and CPAP - can repeat RHC as needed  3. Severe 3v CAD - Severe with 75% RCA, 100% Cx, 80% LM and LAD 50% stenosed - S/p CABG x 3 with bioprosthetic AVR and LAA clip on 12/20 - No s/s ischemia - Continue coreg 3.125 mg BID, Crestor, and ASA - Will start Jardiance 10mg  for CV risk reduction  .  4. H/o aortic stenosis  - s/p bioprosthetic AVR - Echo 12/26: 50-55%, trivial AI with mean gradient of 11 mmHg, trivial MR, RV mod reduced, mild to mod TR. No change.  - aware of need for SBE prophylaxis  5. PAF, post-op -  s/p DCCV 10/07/18 - off amio and eliquis per Dr. Tamala Julian.  - regular today  6. OSA on CPAP - using CPAP regularly   7. DM 2 - Per PCP.  - Will start Jardiance 10mg  for CV risk reduction. Discussed possible risk of volume depletion (decrease lasix as needed). Also discussed small increased risk of vaginal yeats infections to watch out for.   8. CKD Stage III.  -  Creatinine basel - had labs yesterday with PCP  10. HTN - Mildly elevated. Should improve with Lottie Rater, MD 05/22/19  .

## 2019-05-29 ENCOUNTER — Other Ambulatory Visit: Payer: Self-pay

## 2019-05-29 ENCOUNTER — Ambulatory Visit (HOSPITAL_COMMUNITY): Payer: Medicare Other | Attending: Cardiovascular Disease

## 2019-05-29 DIAGNOSIS — I5032 Chronic diastolic (congestive) heart failure: Secondary | ICD-10-CM | POA: Diagnosis present

## 2019-06-02 ENCOUNTER — Ambulatory Visit: Payer: Medicare Other | Admitting: Interventional Cardiology

## 2019-06-03 ENCOUNTER — Telehealth (HOSPITAL_COMMUNITY): Payer: Self-pay

## 2019-06-03 NOTE — Telephone Encounter (Signed)
-----   Message from Jolaine Artist, MD sent at 05/29/2019 11:22 PM EDT ----- EF looks good. Improved from pre-CABG 50-55%

## 2019-06-03 NOTE — Progress Notes (Signed)
Cardiology Office Note:    Date:  06/04/2019   ID:  YASIRAH KURAMOTO, DOB 05-Aug-1941, MRN [id number]  PCP:  Aletha Halim., PA-C  Cardiologist:  Sinclair Grooms, MD   Referring MD: Aletha Halim., PA-C   Chief Complaint  Patient presents with  . Cardiac Valve Problem  . Coronary Artery Disease  . Hypertension  . Follow-up    Pulmonary hypertension    History of Present Illness:    PARMA PICAZO is a 78 y.o. female with a hx of Paroxysmal atrial fibrillation, OSA, hypertension, hyperlipidemia, aortic stenosis, type II DM, and recentAVR(bioprosthesis), LAA clip, and 3 vessel CABG (with LIMA to LAD, SVG to OM, and SVG to RCA) 09/27/18. Required cardioversion and IV amio loading prior to DC (12/30).  She is improving.  She is ambulatory and independent.  She lives alone.  She just saw the heart failure team on 05/27/2019.  She has not had syncope.  She voices concern about her blood pressure.  No peripheral edema.  She is sleeping well.  No medication side effects.  No issues since the recent adjustments in medications by the heart failure team which included adding Jardiance as a risk reducing therapy.  Past Medical History:  Diagnosis Date  . Coronary artery disease   . Depression   . GERD (gastroesophageal reflux disease)   . Heart murmur   . HTN (hypertension)   . Hyperlipidemia   . Iron deficiency anemia   . Obesity (BMI 30-39.9) 03/04/2014  . OSA on CPAP    severe with AHI 31/hr  . PAF (paroxysmal atrial fibrillation) ([place])   . Pneumonia    "twice; years ago" (09/24/2018)  . Skin cancer    "left neck; burned off"  . Type II diabetes mellitus ([place])     Past Surgical History:  Procedure Laterality Date  . ABDOMINAL HYSTERECTOMY    . ANTERIOR CERVICAL DECOMP/DISCECTOMY FUSION     "cage around my neck; Dr. Erline Levine"  . AORTIC VALVE REPLACEMENT N/A 09/27/2018   Procedure: AORTIC VALVE REPLACEMENT (AVR) USING MAGNA EASE SIZE 21 MM;  Surgeon: Grace Isaac, MD;  Location: [place];  Service: Open Heart Surgery;  Laterality: N/A;  . BACK SURGERY    . CARDIAC CATHETERIZATION  09/24/2018  . CARDIOVERSION N/A 12/19/2013   Procedure: CARDIOVERSION;  Surgeon: Sinclair Grooms, MD;  Location: [facility] ENDOSCOPY;  Service: Cardiovascular;  Laterality: N/A;  . CARDIOVERSION N/A 08/16/2015   Procedure: CARDIOVERSION;  Surgeon: Larey Dresser, MD;  Location: [place];  Service: Cardiovascular;  Laterality: N/A;  . CARDIOVERSION N/A 09/13/2015   Procedure: CARDIOVERSION;  Surgeon: Sueanne Margarita, MD;  Location: [facility] ENDOSCOPY;  Service: Cardiovascular;  Laterality: N/A;  . CARDIOVERSION N/A 10/07/2018   Procedure: CARDIOVERSION;  Surgeon: Sanda Klein, MD;  Location: [facility] ENDOSCOPY;  Service: Cardiovascular;  Laterality: N/A;  . CATARACT EXTRACTION W/ INTRAOCULAR LENS  IMPLANT, BILATERAL Bilateral   . CLIPPING OF ATRIAL APPENDAGE N/A 09/27/2018   Procedure: CLIPPING OF ATRIAL APPENDAGE USING ATRICURE FLEXV SIZE 35MM;  Surgeon: Grace Isaac, MD;  Location: [place];  Service: Open Heart Surgery;  Laterality: N/A;  . COLONOSCOPY WITH PROPOFOL N/A 11/13/2017   Procedure: COLONOSCOPY WITH PROPOFOL;  Surgeon: Wilford Corner, MD;  Location: [facility] ENDOSCOPY;  Service: Endoscopy;  Laterality: N/A;  ANTIBIOTIC NEEDED BEFORE PROCEDURE  . CORONARY ARTERY BYPASS GRAFT N/A 09/27/2018   Procedure: CORONARY ARTERY BYPASS GRAFTING (CABG) x three, using left internal mammary artery and  right leg greater saphenous vein harvested endoscopically;  Surgeon: Grace Isaac, MD;  Location: [place];  Service: Open Heart Surgery;  Laterality: N/A;  . ESOPHAGOGASTRODUODENOSCOPY (EGD) WITH PROPOFOL N/A 11/13/2017   Procedure: ESOPHAGOGASTRODUODENOSCOPY (EGD) WITH PROPOFOL;  Surgeon: Wilford Corner, MD;  Location: [facility] ENDOSCOPY;  Service: Endoscopy;  Laterality: N/A;  . EYE SURGERY    . RIGHT/LEFT HEART CATH AND CORONARY ANGIOGRAPHY N/A 09/24/2018   Procedure: RIGHT/LEFT HEART CATH AND  CORONARY ANGIOGRAPHY;  Surgeon: Jettie Booze, MD;  Location: [place] [facility];  Service: Cardiovascular;  Laterality: N/A;  . TEE WITHOUT CARDIOVERSION N/A 09/27/2018   Procedure: TRANSESOPHAGEAL ECHOCARDIOGRAM (TEE);  Surgeon: Grace Isaac, MD;  Location: [place];  Service: Open Heart Surgery;  Laterality: N/A;  . TUBAL LIGATION      Current Medications: Current Meds  Medication Sig  . acetaminophen (TYLENOL) 325 MG tablet Take 650 mg by mouth every 6 (six) hours as needed for moderate pain or headache.  [name] aspirin EC 81 MG EC tablet Take 1 tablet (81 mg total) by mouth daily.  [name] buPROPion (WELLBUTRIN XL) 300 MG 24 hr tablet Take 300 mg by mouth daily.   . empagliflozin (JARDIANCE) 10 MG TABS tablet Take 10 mg by mouth daily before breakfast.  . fexofenadine (ALLEGRA) 60 MG tablet Take 60 mg by mouth daily as needed for allergies.   . furosemide (LASIX) 20 MG tablet TAKE 1 TABLET BY MOUTH DAILY  . liraglutide (VICTOZA) 18 MG/3ML SOPN Inject 1.8 mg into the skin daily.   [name] loperamide (IMODIUM) 2 MG capsule Take 1 capsule (2 mg total) by mouth as needed for diarrhea or loose stools.  [name] MAGNESIUM-OXIDE 400 (241.3 Mg) MG tablet TAKE 1 TABLET BY MOUTH TWICE DAILY  . metFORMIN (GLUCOPHAGE) 1000 MG tablet Take 1,000 mg by mouth 2 (two) times a day.  . Potassium (GNP POTASSIUM) 99 MG TABS Take 99 mg by mouth 3 (three) times daily.   . rosuvastatin (CRESTOR) 20 MG tablet Take 20 mg by mouth daily.  . sildenafil (REVATIO) 20 MG tablet Take 1 tablet (20 mg total) by mouth 3 (three) times daily.  . [DISCONTINUED] carvedilol (COREG) 6.25 MG tablet Take 1 tablet (6.25 mg total) by mouth 2 (two) times daily.     Allergies:   Minocin [minocycline hcl], Benazepril, Hydrochlorothiazide, and Tape   Social History   Socioeconomic History  . Marital status: Married    Spouse name: Thomsa  . Number of children: Not on file  . Years of education: 4  . Highest education level: High school  graduate  Occupational History  . Occupation: Retired  Scientific laboratory technician  . Financial resource strain: Not hard at all  . Food insecurity    Worry: Never true    Inability: Never true  . Transportation needs    Medical: No    Non-medical: No  Tobacco Use  . Smoking status: Never Smoker  . Smokeless tobacco: Never Used  Substance and Sexual Activity  . Alcohol use: Never    Frequency: Never  . Drug use: Never  . Sexual activity: Not Currently  Lifestyle  . Physical activity    Days per week: 3 days    Minutes per session: 10 min  . Stress: Not at all  Relationships  . Social Herbalist on phone: Not on file    Gets together: Not on file    Attends religious service: Not on file    Active member  of club or organization: Not on file    Attends meetings of clubs or organizations: Not on file    Relationship status: Not on file  Other Topics Concern  . Not on file  Social History Narrative  . Not on file     Family History: The patient's family history includes Heart disease in her father; Hypertension in her brother and father; Melanoma in her mother.  ROS:   Please see the history of present illness.    She is anxious about her blood pressure otherwise doing quite well. 1 All other systems reviewed and are negative.  EKGs/Labs/Other Studies Reviewed:    The following studies were reviewed today: No new cardiac imaging data  EKG:  EKG sinus rhythm, left anterior hemiblock, nonspecific ST-T wave change, occasional PVC.  PR interval is 170 ms.  Prominent voltage consistent with LVH.  Recent Labs: 09/28/2018: Magnesium 2.6 09/30/2018: ALT 53 11/15/2018: BUN 14; Creatinine, Ser 1.24; Potassium 4.2; Sodium 140 05/06/2019: Hemoglobin 10.6; Platelet Count 169  Recent Lipid Panel    Component Value Date/Time   CHOL 145 05/08/2018 0820   TRIG 152 (H) 05/08/2018 0820   HDL 43 05/08/2018 0820   CHOLHDL 3.4 05/08/2018 0820   LDLCALC 72 05/08/2018 0820    Physical  Exam:    VS:  BP (!) 188/82   Pulse 83   Ht 5\' 3"  (1.6 m)   Wt 176 lb 1.9 oz (79.9 kg)   LMP  (LMP Unknown)   SpO2 97%   BMI 31.20 kg/m     Wt Readings from Last 3 Encounters:  06/04/19 176 lb 1.9 oz (79.9 kg)  05/22/19 180 lb 12.8 oz (82 kg)  05/06/19 179 lb 1.6 oz (81.2 kg)     GEN: Moderate obesity, appears slightly older than stated age.. No acute distress HEENT: Normal NECK: No JVD. LYMPHATICS: No lymphadenopathy CARDIAC:  RRR without 2/6 systolic prosthetic valve murmur, S4 gallop, but no edema. VASCULAR:  Normal Pulses. No bruits. RESPIRATORY:  Clear to auscultation without rales, wheezing or rhonchi  ABDOMEN: Soft, non-tender, non-distended, No pulsatile mass, MUSCULOSKELETAL: No deformity  SKIN: Warm and dry NEUROLOGIC:  Alert and oriented x 3 PSYCHIATRIC:  Normal affect   ASSESSMENT:    1. Persistent atrial fibrillation   2. Long term current use of amiodarone   3. Chronic diastolic heart failure ([place])   4. Pulmonary hypertension ([place])   5. Diabetes mellitus type 2 in obese ([place])   6. Essential hypertension   7. OSA (obstructive sleep apnea)   8. Coronary artery disease of bypass graft of native heart with stable angina pectoris ([place])   9. Anticoagulation goal of INR 2 to 3   10. Other hyperlipidemia   11. Moderate aortic stenosis   12. Educated About Covid-19 Virus Infection    PLAN:    In order of problems listed above:  1. Resolved and no longer on amiodarone.  She had left atrial ligation at the time of surgery. 2. Amiodarone therapy has been discontinued. 3. No evidence of volume overload.  Diastolic heart failure is controlled. 4. Sildenafil therapy is being used to treat WHO III secondary pulmonary hypertension. 5. Total agreement with heart failure clinic and starting Jardiance 10 mg/day.  This will help A1c and give cardiac/vascular risk reductive benefits. 6. Systolic blood pressure is still too high despite Jardiance.  Will increase  carvedilol to 12.5 mg twice daily.  She will continue to monitor the blood pressure.  Want to keep the  systolic pressure at least 140 mmHg or less. 7. CPAP compliance is endorsed. 8. Secondary risk prevention is being adhered to 9. Anticoagulation therapy has been discontinued 10. Target LDL is less than 70.  Most recently documented to be 61 11. Status post aortic valve replacemen with bioprosthesis.  Target BP: <130/80 mmHg  Diet and lifestyle measures for BP control were reviewed in detail: Low sodium diet (<2.5 gm daily); alcohol restriction (<3 ounces per day); weight loss (Mediterranean); avoid non-steroidal agents; > 6 hours sleep per day; 150 min moderate exercise per week. Medical regimen will include at least 2 agents. Resistant hypertension if not controlled on 3 agents. Consider further evaluation: Sleep study to r/o OSA; Renal angiogram; Primary hyperaldonism and Pheochromocytoma w/u. After 3 agents, consider MRA (spironolactone)/ Epleronone), hydralazine, beta-blocker, and Minoxidil if not already in use due to patient profile.    Medication Adjustments/Labs and Tests Ordered: Current medicines are reviewed at length with the patient today.  Concerns regarding medicines are outlined above.  Orders Placed This Encounter  Procedures  . EKG 12-Lead   Meds ordered this encounter  Medications  . carvedilol (COREG) 12.5 MG tablet    Sig: Take 1 tablet (12.5 mg total) by mouth 2 (two) times daily.    Dispense:  180 tablet    Refill:  3    Dose change    Patient Instructions  Medication Instructions:  1) INCREASE Carvedilol to 12.5mg  twice daily  If you need a refill on your cardiac medications before your next appointment, please call your pharmacy.   Lab work: None If you have labs (blood work) drawn today and your tests are completely normal, you will receive your results only by: [name] MyChart Message (if you have MyChart) OR . A paper copy in the mail If you have any  lab test that is abnormal or we need to change your treatment, we will call you to review the results.  Testing/Procedures: None  Follow-Up: At [facility], you and your health needs are our priority.  As part of our continuing mission to provide you with exceptional heart care, we have created designated Provider Care Teams.  These Care Teams include your primary Cardiologist (physician) and Advanced Practice Providers (APPs -  Physician Assistants and Nurse Practitioners) who all work together to provide you with the care you need, when you need it. You will need a follow up appointment in 6 months.  Please call our office 2 months in advance to schedule this appointment.  You may see Sinclair Grooms, MD or one of the following Advanced Practice Providers on your designated Care Team:   Truitt Merle, NP Cecilie Kicks, NP . Kathyrn Drown, NP  Any Other Special Instructions Will Be Listed Below (If Applicable).       Signed, Sinclair Grooms, MD  06/04/2019 10:37 AM    [place]

## 2019-06-03 NOTE — Telephone Encounter (Signed)
Called both lines on chart. No answer. Left voicemail to call for results of echo.

## 2019-06-04 ENCOUNTER — Ambulatory Visit (INDEPENDENT_AMBULATORY_CARE_PROVIDER_SITE_OTHER): Payer: Medicare Other | Admitting: Interventional Cardiology

## 2019-06-04 ENCOUNTER — Encounter: Payer: Self-pay | Admitting: Interventional Cardiology

## 2019-06-04 ENCOUNTER — Other Ambulatory Visit: Payer: Self-pay

## 2019-06-04 VITALS — BP 188/82 | HR 83 | Ht 63.0 in | Wt 176.1 lb

## 2019-06-04 DIAGNOSIS — I2510 Atherosclerotic heart disease of native coronary artery without angina pectoris: Secondary | ICD-10-CM

## 2019-06-04 DIAGNOSIS — I10 Essential (primary) hypertension: Secondary | ICD-10-CM

## 2019-06-04 DIAGNOSIS — I350 Nonrheumatic aortic (valve) stenosis: Secondary | ICD-10-CM

## 2019-06-04 DIAGNOSIS — Z79899 Other long term (current) drug therapy: Secondary | ICD-10-CM | POA: Diagnosis not present

## 2019-06-04 DIAGNOSIS — E7849 Other hyperlipidemia: Secondary | ICD-10-CM

## 2019-06-04 DIAGNOSIS — I272 Pulmonary hypertension, unspecified: Secondary | ICD-10-CM | POA: Diagnosis not present

## 2019-06-04 DIAGNOSIS — I5032 Chronic diastolic (congestive) heart failure: Secondary | ICD-10-CM | POA: Diagnosis not present

## 2019-06-04 DIAGNOSIS — E669 Obesity, unspecified: Secondary | ICD-10-CM

## 2019-06-04 DIAGNOSIS — I4819 Other persistent atrial fibrillation: Secondary | ICD-10-CM | POA: Diagnosis not present

## 2019-06-04 DIAGNOSIS — Z5181 Encounter for therapeutic drug level monitoring: Secondary | ICD-10-CM

## 2019-06-04 DIAGNOSIS — E1169 Type 2 diabetes mellitus with other specified complication: Secondary | ICD-10-CM

## 2019-06-04 DIAGNOSIS — Z7189 Other specified counseling: Secondary | ICD-10-CM

## 2019-06-04 DIAGNOSIS — I25708 Atherosclerosis of coronary artery bypass graft(s), unspecified, with other forms of angina pectoris: Secondary | ICD-10-CM

## 2019-06-04 DIAGNOSIS — Z7901 Long term (current) use of anticoagulants: Secondary | ICD-10-CM

## 2019-06-04 DIAGNOSIS — G4733 Obstructive sleep apnea (adult) (pediatric): Secondary | ICD-10-CM

## 2019-06-04 MED ORDER — CARVEDILOL 12.5 MG PO TABS: 12.5000 mg | ORAL_TABLET | Freq: Two times a day (BID) | ORAL | 3 refills | Status: DC

## 2019-06-04 NOTE — Patient Instructions (Signed)
Medication Instructions:  1) INCREASE Carvedilol to 12.5mg  twice daily  If you need a refill on your cardiac medications before your next appointment, please call your pharmacy.   Lab work: None If you have labs (blood work) drawn today and your tests are completely normal, you will receive your results only by: [name] MyChart Message (if you have MyChart) OR . A paper copy in the mail If you have any lab test that is abnormal or we need to change your treatment, we will call you to review the results.  Testing/Procedures: None  Follow-Up: At [facility], you and your health needs are our priority.  As part of our continuing mission to provide you with exceptional heart care, we have created designated Provider Care Teams.  These Care Teams include your primary Cardiologist (physician) and Advanced Practice Providers (APPs -  Physician Assistants and Nurse Practitioners) who all work together to provide you with the care you need, when you need it. You will need a follow up appointment in 6 months.  Please call our office 2 months in advance to schedule this appointment.  You may see Sinclair Grooms, MD or one of the following Advanced Practice Providers on your designated Care Team:   Truitt Merle, NP Cecilie Kicks, NP . Kathyrn Drown, NP  Any Other Special Instructions Will Be Listed Below (If Applicable).

## 2019-06-13 ENCOUNTER — Telehealth (HOSPITAL_COMMUNITY): Payer: Self-pay

## 2019-06-13 NOTE — Telephone Encounter (Signed)
Reviewed echo results pt grateful for the news.

## 2019-07-04 ENCOUNTER — Other Ambulatory Visit: Payer: Self-pay | Admitting: Interventional Cardiology

## 2019-09-15 ENCOUNTER — Other Ambulatory Visit: Payer: Self-pay

## 2019-09-15 MED ORDER — FUROSEMIDE 20 MG PO TABS: 20.0000 mg | ORAL_TABLET | Freq: Every day | ORAL | 2 refills | Status: DC

## 2019-10-09 ENCOUNTER — Telehealth: Payer: Self-pay | Admitting: *Deleted

## 2019-10-09 NOTE — Telephone Encounter (Signed)

## 2019-10-17 ENCOUNTER — Encounter: Payer: Self-pay | Admitting: Cardiology

## 2019-10-17 ENCOUNTER — Other Ambulatory Visit: Payer: Self-pay

## 2019-10-17 ENCOUNTER — Telehealth (INDEPENDENT_AMBULATORY_CARE_PROVIDER_SITE_OTHER): Payer: Medicare Other | Admitting: Cardiology

## 2019-10-17 ENCOUNTER — Telehealth: Payer: Self-pay | Admitting: *Deleted

## 2019-10-17 VITALS — BP 122/64 | HR 80 | Ht 63.0 in | Wt 175.0 lb

## 2019-10-17 DIAGNOSIS — G4733 Obstructive sleep apnea (adult) (pediatric): Secondary | ICD-10-CM

## 2019-10-17 DIAGNOSIS — E669 Obesity, unspecified: Secondary | ICD-10-CM | POA: Diagnosis not present

## 2019-10-17 DIAGNOSIS — I10 Essential (primary) hypertension: Secondary | ICD-10-CM | POA: Diagnosis not present

## 2019-10-17 NOTE — Progress Notes (Signed)
Virtual Visit via Telephone Note   This visit type was conducted due to national recommendations for restrictions regarding the COVID-19 Pandemic (e.g. social distancing) in an effort to limit this patient's exposure and mitigate transmission in our community.  Due to her co-morbid illnesses, this patient is at least at moderate risk for complications without adequate follow up.  This format is felt to be most appropriate for this patient at this time.  The patient did not have access to video technology/had technical difficulties with video requiring transitioning to audio format only (telephone).  All issues noted in this document were discussed and addressed.  No physical exam could be performed with this format.  Please refer to the patient's chart for her  consent to telehealth for [facility].   Evaluation Performed:  Follow-up visit  This visit type was conducted due to national recommendations for restrictions regarding the COVID-19 Pandemic (e.g. social distancing).  This format is felt to be most appropriate for this patient at this time.  All issues noted in this document were discussed and addressed.  No physical exam was performed (except for noted visual exam findings with Video Visits).  Please refer to the patient's chart (MyChart message for video visits and phone note for telephone visits) for the patient's consent to telehealth for [facility].  Date:  10/17/2019   ID:  Doris Jefferson, DOB [birth year], MRN [id number]  Patient Location:  Home  Provider location:   [place]  PCP:  Aletha Halim., PA-C  Cardiologist:  Sinclair Grooms, MD  Sleep Medicine:  Fransico Him, MD Electrophysiologist:  None   Chief Complaint:  OSA  History of Present Illness:    Doris Jefferson is a 79 y.o. female who presents via audio/video conferencing for a telehealth visit today.    Doris Jefferson is a 79 y.o. female with a hx of PAF, pulmonary HTN, HTN, obesity and OSA on CPAP.  She is doing well with her CPAP device.  She tolerates the mask and feels the pressure is adequate.  Since going on CPAP She feels rested in the am and has no significant daytime sleepiness.  She has problems with nasal dryness in the am.   She does not think that he snores.    The patient does not have symptoms concerning for COVID-19 infection (fever, chills, cough, or new shortness of breath).   Prior CV studies:   The following studies were reviewed today:  PAP compliance download  Past Medical History:  Diagnosis Date  . Coronary artery disease   . Depression   . GERD (gastroesophageal reflux disease)   . Heart murmur   . HTN (hypertension)   . Hyperlipidemia   . Iron deficiency anemia   . Obesity (BMI 30-39.9) 03/04/2014  . OSA on CPAP    severe with AHI 31/hr  . PAF (paroxysmal atrial fibrillation) ([place])   . Pneumonia    "twice; years ago" (09/24/2018)  . Skin cancer    "left neck; burned off"  . Type II diabetes mellitus ([place])    Past Surgical History:  Procedure Laterality Date  . ABDOMINAL HYSTERECTOMY    . ANTERIOR CERVICAL DECOMP/DISCECTOMY FUSION     "cage around my neck; Dr. Erline Levine"  . AORTIC VALVE REPLACEMENT N/A 09/27/2018   Procedure: AORTIC VALVE REPLACEMENT (AVR) USING MAGNA EASE SIZE 21 MM;  Surgeon: Grace Isaac, MD;  Location: [place];  Service: Open Heart Surgery;  Laterality: N/A;  . BACK  SURGERY    . CARDIAC CATHETERIZATION  09/24/2018  . CARDIOVERSION N/A 12/19/2013   Procedure: CARDIOVERSION;  Surgeon: Sinclair Grooms, MD;  Location: [facility] ENDOSCOPY;  Service: Cardiovascular;  Laterality: N/A;  . CARDIOVERSION N/A 08/16/2015   Procedure: CARDIOVERSION;  Surgeon: Larey Dresser, MD;  Location: [place];  Service: Cardiovascular;  Laterality: N/A;  . CARDIOVERSION N/A 09/13/2015   Procedure: CARDIOVERSION;  Surgeon: Sueanne Margarita, MD;  Location[facility]COPY;  Service: Cardiovascular;  Laterality: N/A;  . CARDIOVERSION N/A 10/07/2018    Procedure: CARDIOVERSION;  Surgeon: Sanda Klein, MD;  Location: [facility] ENDOSCOPY;  Service: Cardiovascular;  Laterality: N/A;  . CATARACT EXTRACTION W/ INTRAOCULAR LENS  IMPLANT, BILATERAL Bilateral   . CLIPPING OF ATRIAL APPENDAGE N/A 09/27/2018   Procedure: CLIPPING OF ATRIAL APPENDAGE USING ATRICURE FLEXV SIZE 35MM;  Surgeon: Grace Isaac, MD;  Location: [place];  Service: Open Heart Surgery;  Laterality: N/A;  . COLONOSCOPY WITH PROPOFOL N/A 11/13/2017   Procedure: COLONOSCOPY WITH PROPOFOL;  Surgeon: Wilford Corner, MD;  Location: [facility] ENDOSCOPY;  Service: Endoscopy;  Laterality: N/A;  ANTIBIOTIC NEEDED BEFORE PROCEDURE  . CORONARY ARTERY BYPASS GRAFT N/A 09/27/2018   Procedure: CORONARY ARTERY BYPASS GRAFTING (CABG) x three, using left internal mammary artery and right leg greater saphenous vein harvested endoscopically;  Surgeon: Grace Isaac, MD;  Location: [place];  Service: Open Heart Surgery;  Laterality: N/A;  . ESOPHAGOGASTRODUODENOSCOPY (EGD) WITH PROPOFOL N/A 11/13/2017   Procedure: ESOPHAGOGASTRODUODENOSCOPY (EGD) WITH PROPOFOL;  Surgeon: Wilford Corner, MD;  Location: [facility] ENDOSCOPY;  Service: Endoscopy;  Laterality: N/A;  . EYE SURGERY    . RIGHT/LEFT HEART CATH AND CORONARY ANGIOGRAPHY N/A 09/24/2018   Procedure: RIGHT/LEFT HEART CATH AND CORONARY ANGIOGRAPHY;  Surgeon: Jettie Booze, MD;  Location: [place] CV LAB;  Service: Cardiovascular;  Laterality: N/A;  . TEE WITHOUT CARDIOVERSION N/A 09/27/2018   Procedure: TRANSESOPHAGEAL ECHOCARDIOGRAM (TEE);  Surgeon: Grace Isaac, MD;  Location: [place];  Service: Open Heart Surgery;  Laterality: N/A;  . TUBAL LIGATION       Current Meds  Medication Sig  . acetaminophen (TYLENOL) 325 MG tablet Take 650 mg by mouth every 6 (six) hours as needed for moderate pain or headache.  [name] aspirin EC 81 MG EC tablet Take 1 tablet (81 mg total) by mouth daily.  [name] buPROPion (WELLBUTRIN XL) 300 MG 24 hr tablet Take 300 mg by  mouth daily.   . carvedilol (COREG) 12.5 MG tablet Take 1 tablet (12.5 mg total) by mouth 2 (two) times daily.  . cetirizine (ZYRTEC) 10 MG tablet Take 10 mg by mouth as needed for allergies.  [name] empagliflozin (JARDIANCE) 10 MG TABS tablet Take 10 mg by mouth daily before breakfast.  . furosemide (LASIX) 20 MG tablet Take 1 tablet (20 mg total) by mouth daily.  [name] liraglutide (VICTOZA) 18 MG/3ML SOPN Inject 1.8 mg into the skin daily.   [name] loperamide (IMODIUM) 2 MG capsule Take 1 capsule (2 mg total) by mouth as needed for diarrhea or loose stools.  [name] MAGNESIUM-OXIDE 400 (241.3 Mg) MG tablet TAKE 1 TABLET BY MOUTH TWICE DAILY  . metFORMIN (GLUCOPHAGE) 1000 MG tablet Take 1,000 mg by mouth 2 (two) times a day.  . Potassium (GNP POTASSIUM) 99 MG TABS Take 99 mg by mouth 3 (three) times daily.   . rosuvastatin (CRESTOR) 20 MG tablet Take 20 mg by mouth daily.  . sildenafil (REVATIO) 20 MG tablet Take 1 tablet (20 mg total) by  mouth 3 (three) times daily.     Allergies:   Minocin [minocycline hcl], Benazepril, Hydrochlorothiazide, and Tape   Social History   Tobacco Use  . Smoking status: Never Smoker  . Smokeless tobacco: Never Used  Substance Use Topics  . Alcohol use: Never  . Drug use: Never     Family Hx: The patient's family history includes Heart disease in her father; Hypertension in her brother and father; Melanoma in her mother.  ROS:   Please see the history of present illness.     All other systems reviewed and are negative.   Labs/Other Tests and Data Reviewed:    Recent Labs: 11/15/2018: BUN 14; Creatinine, Ser 1.24; Potassium 4.2; Sodium 140 05/06/2019: Hemoglobin 10.6; Platelet Count 169   Recent Lipid Panel Lab Results  Component Value Date/Time   CHOL 145 05/08/2018 08:20 AM   TRIG 152 (H) 05/08/2018 08:20 AM   HDL 43 05/08/2018 08:20 AM   CHOLHDL 3.4 05/08/2018 08:20 AM   LDLCALC 72 05/08/2018 08:20 AM    Wt Readings from Last 3 Encounters:  10/17/19 175  lb (79.4 kg)  06/04/19 176 lb 1.9 oz (79.9 kg)  05/22/19 180 lb 12.8 oz (82 kg)     Objective:    Vital Signs:  BP 122/64   Pulse 80   Ht 5\' 3"  (1.6 m)   Wt 175 lb (79.4 kg)   LMP  (LMP Unknown)   BMI 31.00 kg/m      ASSESSMENT & PLAN:    1.  OSA -  The patient is tolerating PAP therapy well without any problems. The PAP download was reviewed today and showed an AHI of 0.8/hr on 14 cm H2O with 97% compliance in using more than 4 hours nightly.  The patient has been using and benefiting from PAP use and will continue to benefit from therapy. She is due for a new PAP device which I will order and order new supplies and new mask of her choice. I encouraged her to use nasal saline spray 2 sprays twice daily and adjust her humidity on the new device.   2.  Obesity  -I have encouraged her to get into a routine exercise program and cut back on carbs and portions.   3.  HTN -continue Carvedilol 12.5mg  BID   COVID-19 Education: The signs and symptoms of COVID-19 were discussed with the patient and how to seek care for testing (follow up with PCP or arrange E-visit).  The importance of social distancing was discussed today.  Patient Risk:   After full review of this patient's clinical status, I feel that they are at least moderate risk at this time.  Time:   Today, I have spent 20 minutes  on telemedicine discussing medical problems including OSA, HTN, obesity.  We also reviewed the symptoms of COVID 19 and the ways to protect against contracting the virus with telehealth technology.  I spent an additional 5 minutes reviewing patient's chart including PAP compliance.  Medication Adjustments/Labs and Tests Ordered: Current medicines are reviewed at length with the patient today.  Concerns regarding medicines are outlined above.  Tests Ordered: No orders of the defined types were placed in this encounter.  Medication Changes: No orders of the defined types were placed in this  encounter.   Disposition:  Follow up with me in 10 weeks after getting her device  Signed, Fransico Him, MD  10/17/2019 10:18 AM    [place]

## 2019-10-17 NOTE — Telephone Encounter (Signed)
-----   Message from Sueanne Margarita, MD sent at 10/17/2019 10:20 AM EST ----- Please order a ResMed CPAP at 14cm H2O with heated humidity and mask of choice.  Please set up appt to look at the different types of nasal and nasal pillow masks as the one she has is not working and she has an allergic reaction.  She may do well with the Respironics Dreamwear mask.  She will need to see me in 10 weeks after getting her device.

## 2019-10-17 NOTE — Telephone Encounter (Signed)
Order placed to [facility] via community message.

## 2019-11-04 ENCOUNTER — Encounter: Payer: Self-pay | Admitting: Oncology

## 2019-11-05 ENCOUNTER — Ambulatory Visit: Payer: Self-pay

## 2019-11-06 ENCOUNTER — Telehealth: Payer: Self-pay | Admitting: Oncology

## 2019-11-06 ENCOUNTER — Inpatient Hospital Stay: Payer: Medicare Other

## 2019-11-06 ENCOUNTER — Other Ambulatory Visit: Payer: Self-pay

## 2019-11-06 ENCOUNTER — Inpatient Hospital Stay: Payer: Medicare Other | Attending: Oncology | Admitting: Oncology

## 2019-11-06 VITALS — BP 128/46 | HR 92 | Temp 97.3°F | Resp 18 | Wt 173.0 lb

## 2019-11-06 DIAGNOSIS — D509 Iron deficiency anemia, unspecified: Secondary | ICD-10-CM | POA: Diagnosis present

## 2019-11-06 LAB — CBC WITH DIFFERENTIAL (CANCER CENTER ONLY)
Abs Immature Granulocytes: 0.02 10*3/uL (ref 0.00–0.07)
Basophils Absolute: 0 10*3/uL (ref 0.0–0.1)
Basophils Relative: 0 %
Eosinophils Absolute: 0.1 10*3/uL (ref 0.0–0.5)
Eosinophils Relative: 2 %
HCT: 35.1 % — ABNORMAL LOW (ref 36.0–46.0)
Hemoglobin: 10.9 g/dL — ABNORMAL LOW (ref 12.0–15.0)
Immature Granulocytes: 0 %
Lymphocytes Relative: 33 %
Lymphs Abs: 2.3 10*3/uL (ref 0.7–4.0)
MCH: 27.1 pg (ref 26.0–34.0)
MCHC: 31.1 g/dL (ref 30.0–36.0)
MCV: 87.3 fL (ref 80.0–100.0)
Monocytes Absolute: 0.6 10*3/uL (ref 0.1–1.0)
Monocytes Relative: 9 %
Neutro Abs: 3.9 10*3/uL (ref 1.7–7.7)
Neutrophils Relative %: 56 %
Platelet Count: 136 10*3/uL — ABNORMAL LOW (ref 150–400)
RBC: 4.02 MIL/uL (ref 3.87–5.11)
RDW: 13.5 % (ref 11.5–15.5)
WBC Count: 6.9 10*3/uL (ref 4.0–10.5)
nRBC: 0 % (ref 0.0–0.2)

## 2019-11-06 LAB — IRON AND TIBC
Iron: 60 ug/dL (ref 41–142)
Saturation Ratios: 19 % — ABNORMAL LOW (ref 21–57)
TIBC: 313 ug/dL (ref 236–444)
UIBC: 253 ug/dL (ref 120–384)

## 2019-11-06 LAB — FERRITIN: Ferritin: 36 ng/mL (ref 11–307)

## 2019-11-06 NOTE — Progress Notes (Signed)
Hematology and Oncology Follow Up Visit  ANNESLEY LOZZI [id number] May 28, 1941 79 y.o. 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   Prior Therapy:  Iron infusion completed in August 2019.  Current therapy: Active surveillance and repeat iron infusion as needed.  Interim History: Ms. Pancake returns today for repeat follow-up.  Since her last visit, she reports no major changes in her health.  She continues to perform activities of daily living without any decline in ability to do so.  She denies any chest pain shortness of breath or dyspnea on exertion.  She denies any hematochezia melena.  She denies any abdominal pain or discomfort.  Home status quality of life remains unchanged.        Medications: Updated on review. Current Outpatient Medications  Medication Sig Dispense Refill  . acetaminophen (TYLENOL) 325 MG tablet Take 650 mg by mouth every 6 (six) hours as needed for moderate pain or headache.    [name] aspirin EC 81 MG EC tablet Take 1 tablet (81 mg total) by mouth daily.    [name] buPROPion (WELLBUTRIN XL) 300 MG 24 hr tablet Take 300 mg by mouth daily.     . carvedilol (COREG) 12.5 MG tablet Take 1 tablet (12.5 mg total) by mouth 2 (two) times daily. 180 tablet 3  . cetirizine (ZYRTEC) 10 MG tablet Take 10 mg by mouth as needed for allergies.    [name] empagliflozin (JARDIANCE) 10 MG TABS tablet Take 10 mg by mouth daily before breakfast. 30 tablet 6  . furosemide (LASIX) 20 MG tablet Take 1 tablet (20 mg total) by mouth daily. 90 tablet 2  . liraglutide (VICTOZA) 18 MG/3ML SOPN Inject 1.8 mg into the skin daily.     [name] loperamide (IMODIUM) 2 MG capsule Take 1 capsule (2 mg total) by mouth as needed for diarrhea or loose stools. 30 capsule 0  . MAGNESIUM-OXIDE 400 (241.3 Mg) MG tablet TAKE 1 TABLET BY MOUTH TWICE DAILY 180 tablet 3  .  metFORMIN (GLUCOPHAGE) 1000 MG tablet Take 1,000 mg by mouth 2 (two) times a day.    . Potassium (GNP POTASSIUM) 99 MG TABS Take 99 mg by mouth 3 (three) times daily.     . rosuvastatin (CRESTOR) 20 MG tablet Take 20 mg by mouth daily.    . sildenafil (REVATIO) 20 MG tablet Take 1 tablet (20 mg total) by mouth 3 (three) times daily. 90 tablet 3   No current facility-administered medications for this visit.     Allergies:  Allergies  Allergen Reactions  . Minocin [Minocycline Hcl] Swelling and Other (See Comments)    THROAT SWELLING  . Benazepril Cough  . Hydrochlorothiazide Itching  . Tape Rash       Physical Exam: Blood pressure (!) 128/46, pulse 92, temperature (!) 97.3 F (36.3 C), temperature source Temporal, resp. rate 18, weight 173 lb (78.5 kg), SpO2 100 %.    ECOG: 1   General appearance: Comfortable appearing without any discomfort Head: Normocephalic without any trauma Oropharynx: Mucous membranes are moist and pink without any thrush or ulcers. Eyes: Pupils are equal and round reactive to light. Lymph nodes: No cervical, supraclavicular, inguinal or axillary lymphadenopathy.   Heart:regular rate and rhythm.  S1 and S2 without leg edema. Lung: Clear without any rhonchi or wheezes.  No dullness to percussion. Abdomin: Soft, nontender, nondistended with good bowel  sounds.  No hepatosplenomegaly. Musculoskeletal: No joint deformity or effusion.  Full range of motion noted. Neurological: No deficits noted on motor, sensory and deep tendon reflex exam. Skin: No petechial rash or dryness.  Appeared moist.        Lab Results: Lab Results  Component Value Date   WBC 7.5 05/06/2019   HGB 10.6 (L) 05/06/2019   HCT 35.5 (L) 05/06/2019   MCV 88.3 05/06/2019   PLT 169 05/06/2019     Chemistry      Component Value Date/Time   NA 140 11/15/2018 0937   NA 144 09/19/2018 1053   K 4.2 11/15/2018 0937   CL 105 11/15/2018 0937   CO2 24 11/15/2018 0937   BUN 14  11/15/2018 0937   BUN 15 09/19/2018 1053   CREATININE 1.24 (H) 11/15/2018 0937   CREATININE 1.00 (H) 09/06/2015 1051      Component Value Date/Time   CALCIUM 9.2 11/15/2018 0937   ALKPHOS 58 09/30/2018 0533   AST 30 09/30/2018 0533   ALT 53 (H) 09/30/2018 0533   BILITOT 0.7 09/30/2018 0533   BILITOT 0.5 05/08/2018 0820       Impression and Plan:  79 year old woman with the following:  1.    Anemia diagnosed in July 2019.  She was found to have iron deficiency without any clear-cut GI blood losses.   He is status post intravenous iron in the past with improvement in her hemoglobin as well as iron studies.  Laboratory data in July November 2020 showed a hemoglobin of 10.6 with ferritin at 50.  Risks and benefits of repeat intravenous iron was discussed.  Potential complications including arthralgias, myalgias and infusion related complications were reiterated.  Hemoglobin today is 10.9 with iron studies are currently pending.  If her iron levels are dropping we will arrange for intravenous iron otherwise we will repeat in 6 months.  2.  Follow-up: She will return in 6 months for repeat follow-up.  20  minutes was spent on this encounter.  The time was dedicated to reviewing laboratory data, discussing treatment options and complications related to therapy.     Zola Button, MD 1/28/20218:29 AM

## 2019-11-06 NOTE — Telephone Encounter (Signed)
Scheduled appt per 1/28 los.  Sent a message to HIM pool to have a calendar mailed out.

## 2019-11-13 ENCOUNTER — Ambulatory Visit: Payer: Medicare Other | Admitting: Cardiology

## 2019-11-14 ENCOUNTER — Ambulatory Visit: Payer: Medicare Other | Attending: Internal Medicine

## 2019-11-14 DIAGNOSIS — Z23 Encounter for immunization: Secondary | ICD-10-CM

## 2019-11-14 NOTE — Progress Notes (Signed)
   Covid-19 Vaccination Clinic  Name:  JURLENE GILCHREST    MRN: [id number] DOB: [birth year]  11/14/2019  Ms. Cristina was observed post Covid-19 immunization for 15 minutes without incidence. She was provided with Vaccine Information Sheet and instruction to access the V-Safe system.   Ms. Lamp was instructed to call 911 with any severe reactions post vaccine: [name] Difficulty breathing  . Swelling of your face and throat  . A fast heartbeat  . A bad rash all over your body  . Dizziness and weakness    Immunizations Administered    Name Date Dose VIS Date Route   Pfizer COVID-19 Vaccine 11/14/2019 11:40 AM 0.3 mL 09/19/2019 Intramuscular   Manufacturer: [place]   Lot: [id number]   [place]: [id number]

## 2019-11-29 ENCOUNTER — Other Ambulatory Visit: Payer: Self-pay

## 2019-11-29 ENCOUNTER — Emergency Department (HOSPITAL_COMMUNITY)
Admission: EM | Admit: 2019-11-29 | Discharge: 2019-11-29 | Disposition: A | Discharge status: Home / Self Care | Payer: Medicare Other | Attending: Emergency Medicine | Admitting: Emergency Medicine

## 2019-11-29 DIAGNOSIS — M545 Low back pain, unspecified: Secondary | ICD-10-CM

## 2019-11-29 DIAGNOSIS — Z951 Presence of aortocoronary bypass graft: Secondary | ICD-10-CM | POA: Insufficient documentation

## 2019-11-29 DIAGNOSIS — Z952 Presence of prosthetic heart valve: Secondary | ICD-10-CM | POA: Diagnosis not present

## 2019-11-29 DIAGNOSIS — I10 Essential (primary) hypertension: Secondary | ICD-10-CM | POA: Insufficient documentation

## 2019-11-29 DIAGNOSIS — E119 Type 2 diabetes mellitus without complications: Secondary | ICD-10-CM | POA: Diagnosis not present

## 2019-11-29 DIAGNOSIS — Z7982 Long term (current) use of aspirin: Secondary | ICD-10-CM | POA: Insufficient documentation

## 2019-11-29 DIAGNOSIS — I2510 Atherosclerotic heart disease of native coronary artery without angina pectoris: Secondary | ICD-10-CM | POA: Insufficient documentation

## 2019-11-29 DIAGNOSIS — Z79899 Other long term (current) drug therapy: Secondary | ICD-10-CM | POA: Diagnosis not present

## 2019-11-29 DIAGNOSIS — Z7984 Long term (current) use of oral hypoglycemic drugs: Secondary | ICD-10-CM | POA: Insufficient documentation

## 2019-11-29 MED ORDER — LORAZEPAM 2 MG/ML IJ SOLN
1.0000 mg | Freq: Once | INTRAMUSCULAR | Status: AC
  Administered 2019-11-29: 1 mg via INTRAMUSCULAR
  Filled 2019-11-29: qty 1

## 2019-11-29 MED ORDER — TRAMADOL HCL 50 MG PO TABS: 50.0000 mg | ORAL_TABLET | Freq: Four times a day (QID) | ORAL | 0 refills | Status: DC | PRN

## 2019-11-29 MED ORDER — FENTANYL CITRATE (PF) 100 MCG/2ML IJ SOLN
50.0000 ug | Freq: Once | INTRAMUSCULAR | Status: AC
  Administered 2019-11-29: 50 ug via INTRAMUSCULAR
  Filled 2019-11-29: qty 2

## 2019-11-29 MED ORDER — LORAZEPAM 0.5 MG PO TABS: 0.5000 mg | ORAL_TABLET | Freq: Three times a day (TID) | ORAL | 0 refills | Status: DC | PRN

## 2019-11-29 NOTE — ED Notes (Signed)
She smiles and tells me "the medicine is helping". She tells me she does not need any additional medication of any kind at this time.

## 2019-11-29 NOTE — Discharge Instructions (Signed)
Use ice or heat on the lower back, whichever helps, several times a day for 3 or 4 days.  Rest as much as possible.  Do not drive a car or drink alcohol while taking the sedating medications.  Follow-up with your primary care doctor for further care and treatment as soon as possible.

## 2019-11-29 NOTE — ED Provider Notes (Signed)
[place] DEPT Provider Note   CSN: [id number] Arrival date & time: 11/29/19  [id number]     History Chief Complaint  Patient presents with  . Back Pain    Doris Jefferson is a 79 y.o. female.  HPI She presents for evaluation of back pain.  She describes it as sharp, "stabbing," in her lower back.  The pain is severe rated 10/10.  She saw her PCP for an annual visit, 4 days ago and at that time was baseline stable.  She states the pain in her lower back started around 3 AM when she turned over in bed and has been persistent since that time.  She did not take anything for it.  She came here by private vehicle, husband driving.  No trauma known.  No fever, chills, nausea, vomiting, weakness or paresthesia.  No prior similar problems.  She denies having prior back surgery but has had surgery on her neck.  There are no other known modifying factors.    Past Medical History:  Diagnosis Date  . Coronary artery disease   . Depression   . GERD (gastroesophageal reflux disease)   . Heart murmur   . HTN (hypertension)   . Hyperlipidemia   . Iron deficiency anemia   . Obesity (BMI 30-39.9) 03/04/2014  . OSA on CPAP    severe with AHI 31/hr  . PAF (paroxysmal atrial fibrillation) ([place])   . Pneumonia    "twice; years ago" (09/24/2018)  . Skin cancer    "left neck; burned off"  . Type II diabetes mellitus [facility])     Patient Active Problem List   Diagnosis Date Noted  . Coronary artery disease involving coronary bypass graft 10/16/2018  . Diabetes mellitus type 2 in obese ([place])   . Acute blood loss anemia   . PAF (paroxysmal atrial fibrillation) ([place])   . Pulmonary hypertension ([place])   . Depression   . 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 S/P AVR (aortic valve replacement) 09/27/2018  . Vertigo 02/08/2018  . Iron deficiency anemia, unspecified 11/13/2017  . HTN (hypertension) 09/25/2016  . Esophageal reflux 02/23/2016  . Pure hypercholesterolemia 02/23/2016  .  Unspecified cataract 02/23/2016  . Obesity (BMI 30-39.9) 03/04/2014  . OSA (obstructive sleep apnea) 12/31/2013  . Anticoagulation goal of INR 2 to 3 11/17/2013  . Status post aortic valve replacement with tissue valve 11/10/2013  . Pulmonary HTN ([place]) 11/10/2013  . Diabetes mellitus ([place]) 11/10/2013    Past Surgical History:  Procedure Laterality Date  . ABDOMINAL HYSTERECTOMY    . ANTERIOR CERVICAL DECOMP/DISCECTOMY FUSION     "cage around my neck; Dr. Erline Levine"  . AORTIC VALVE REPLACEMENT N/A 09/27/2018   Procedure: AORTIC VALVE REPLACEMENT (AVR) USING MAGNA EASE SIZE 21 MM;  Surgeon: Grace Isaac, MD;  Location: [place];  Service: Open Heart Surgery;  Laterality: N/A;  . BACK SURGERY    . CARDIAC CATHETERIZATION  09/24/2018  . CARDIOVERSION N/A 12/19/2013   Procedure: CARDIOVERSION;  Surgeon: Sinclair Grooms, MD;  Location: [facility] ENDOSCOPY;  Service: Cardiovascular;  Laterality: N/A;  . CARDIOVERSION N/A 08/16/2015   Procedure: CARDIOVERSION;  Surgeon: Larey Dresser, MD;  Location: [place];  Service: Cardiovascular;  Laterality: N/A;  . CARDIOVERSION N/A 09/13/2015   Procedure: CARDIOVERSION;  Surgeon: Sueanne Margarita, MD;  Location: [facility] ENDOSCOPY;  Service: Cardiovascular;  Laterality: N/A;  . CARDIOVERSION N/A 10/07/2018   Procedure: CARDIOVERSION;  Surgeon: Sanda Klein, MD;  Location: [facility]  ENDOSCOPY;  Service: Cardiovascular;  Laterality: N/A;  . CATARACT EXTRACTION W/ INTRAOCULAR LENS  IMPLANT, BILATERAL Bilateral   . CLIPPING OF ATRIAL APPENDAGE N/A 09/27/2018   Procedure: CLIPPING OF ATRIAL APPENDAGE USING ATRICURE FLEXV SIZE 35MM;  Surgeon: Grace Isaac, MD;  Location: [place];  Service: Open Heart Surgery;  Laterality: N/A;  . COLONOSCOPY WITH PROPOFOL N/A 11/13/2017   Procedure: COLONOSCOPY WITH PROPOFOL;  Surgeon: Wilford Corner, MD;  Location: [facility] ENDOSCOPY;  Service: Endoscopy;  Laterality: N/A;  ANTIBIOTIC NEEDED BEFORE PROCEDURE  . CORONARY ARTERY  BYPASS GRAFT N/A 09/27/2018   Procedure: CORONARY ARTERY BYPASS GRAFTING (CABG) x three, using left internal mammary artery and right leg greater saphenous vein harvested endoscopically;  Surgeon: Grace Isaac, MD;  Location: [place];  Service: Open Heart Surgery;  Laterality: N/A;  . ESOPHAGOGASTRODUODENOSCOPY (EGD) WITH PROPOFOL N/A 11/13/2017   Procedure: ESOPHAGOGASTRODUODENOSCOPY (EGD) WITH PROPOFOL;  Surgeon: Wilford Corner, MD;  Location: [facility] ENDOSCOPY;  Service: Endoscopy;  Laterality: N/A;  . EYE SURGERY    . RIGHT/LEFT HEART CATH AND CORONARY ANGIOGRAPHY N/A 09/24/2018   Procedure: RIGHT/LEFT HEART CATH AND CORONARY ANGIOGRAPHY;  Surgeon: Jettie Booze, MD;  Location: [place] CV LAB;  Service: Cardiovascular;  Laterality: N/A;  . TEE WITHOUT CARDIOVERSION N/A 09/27/2018   Procedure: TRANSESOPHAGEAL ECHOCARDIOGRAM (TEE);  Surgeon: Grace Isaac, MD;  Location: [place];  Service: Open Heart Surgery;  Laterality: N/A;  . TUBAL LIGATION       OB History   No obstetric history on file.     Family History  Problem Relation Age of Onset  . Hypertension Father   . Heart disease Father   . Melanoma Mother   . Hypertension Brother     Social History   Tobacco Use  . Smoking status: Never Smoker  . Smokeless tobacco: Never Used  Substance Use Topics  . Alcohol use: Never  . Drug use: Never    Home Medications Prior to Admission medications   Medication Sig Start Date End Date Taking? Authorizing Provider  acetaminophen (TYLENOL) 325 MG tablet Take 650 mg by mouth every 6 (six) hours as needed for moderate pain or headache.   Yes [provider]  aspirin EC 81 MG EC tablet Take 1 tablet (81 mg total) by mouth daily. 10/08/18  Yes Barrett, Erin R, PA-C  buPROPion (WELLBUTRIN XL) 300 MG 24 hr tablet Take 300 mg by mouth daily.  01/11/16  Yes [provider]  carvedilol (COREG) 12.5 MG tablet Take 1 tablet (12.5 mg total) by mouth 2 (two) times  daily. 06/04/19 11/29/19 Yes Belva Crome, MD  cetirizine (ZYRTEC) 10 MG tablet Take 10 mg by mouth as needed for allergies.   Yes [provider]  empagliflozin (JARDIANCE) 10 MG TABS tablet Take 10 mg by mouth daily before breakfast. 05/22/19  Yes Bensimhon, Shaune Pascal, MD  furosemide (LASIX) 20 MG tablet Take 1 tablet (20 mg total) by mouth daily. 09/15/19  Yes Belva Crome, MD  liraglutide (VICTOZA) 18 MG/3ML SOPN Inject 1.8 mg into the skin daily.    Yes [provider]  loperamide (IMODIUM) 2 MG capsule Take 1 capsule (2 mg total) by mouth as needed for diarrhea or loose stools. 10/09/18  Yes Conte, Tessa N, PA-C  MAGNESIUM-OXIDE 400 (241.3 Mg) MG tablet TAKE 1 TABLET BY MOUTH TWICE DAILY 07/04/19  Yes Belva Crome, MD  metFORMIN (GLUCOPHAGE) 1000 MG tablet Take 1,000 mg by mouth 2 (two) times a day.  01/01/19  Yes [provider]  Potassium (GNP POTASSIUM) 99 MG TABS Take 99 mg by mouth 3 (three) times daily.    Yes [provider]  rosuvastatin (CRESTOR) 20 MG tablet Take 20 mg by mouth daily.   Yes [provider]  sildenafil (REVATIO) 20 MG tablet Take 1 tablet (20 mg total) by mouth 3 (three) times daily. 10/09/18  Yes Conte, Tessa N, PA-C  LORazepam (ATIVAN) 0.5 MG tablet Take 1 tablet (0.5 mg total) by mouth 3 (three) times daily as needed (muscle spasm). 11/29/19   Daleen Bo, MD  traMADol (ULTRAM) 50 MG tablet Take 1 tablet (50 mg total) by mouth every 6 (six) hours as needed for moderate pain. 11/29/19   Daleen Bo, MD    Allergies    Minocin [minocycline hcl], Benazepril, Hydrochlorothiazide, and Tape  Review of Systems   Review of Systems  All other systems reviewed and are negative.   Physical Exam Updated Vital Signs BP (!) 174/65   Pulse 98   Temp 98.7 F (37.1 C) (Oral)   Resp 17   Ht 5\' 3"  (1.6 m)   Wt 77.1 kg   LMP  (LMP Unknown)   SpO2 100%   BMI 30.11 kg/m   Physical Exam Vitals and nursing note reviewed.    Constitutional:      Appearance: She is well-developed.  HENT:     Head: Normocephalic and atraumatic.     Right Ear: External ear normal.     Left Ear: External ear normal.  Eyes:     Conjunctiva/sclera: Conjunctivae normal.     Pupils: Pupils are equal, round, and reactive to light.  Neck:     Trachea: Phonation normal.  Cardiovascular:     Rate and Rhythm: Tachycardia present.  Pulmonary:     Effort: Pulmonary effort is normal. No respiratory distress.     Breath sounds: Normal breath sounds. No stridor.  Abdominal:     General: There is no distension.     Palpations: Abdomen is soft.  Musculoskeletal:     Cervical back: Normal range of motion and neck supple.     Comments: Negative straight leg raising bilaterally.  Tender left lumbar to palpation and resistant to moving in the bed, secondary to low back pain.  Neurovascular intact distally in the feet bilaterally.  Skin:    General: Skin is warm and dry.  Neurological:     Mental Status: She is alert and oriented to person, place, and time.     Cranial Nerves: No cranial nerve deficit.     Sensory: No sensory deficit.     Motor: No abnormal muscle tone.     Coordination: Coordination normal.  Psychiatric:        Mood and Affect: Mood normal.        Behavior: Behavior normal.        Thought Content: Thought content normal.        Judgment: Judgment normal.     ED Results / Procedures / Treatments   Labs (all labs ordered are listed, but only abnormal results are displayed) Labs Reviewed - No data to display  EKG None  Radiology No results found.  Procedures Procedures (including critical care time)  Medications Ordered in ED Medications  LORazepam (ATIVAN) injection 1 mg (1 mg Intramuscular Given 11/29/19 1013)  fentaNYL (SUBLIMAZE) injection 50 mcg (50 mcg Intramuscular Given 11/29/19 1013)    ED Course  I have reviewed the triage vital signs and the  nursing notes.  Pertinent labs & imaging results  that were available during my care of the patient were reviewed by me and considered in my medical decision making (see chart for details).  Clinical Course as of Nov 28 1326  Sat Nov 29, 2019  1221 She was able ambulate in the hallway with minimal assistance, stating while she is walking, that she feels better.   [EW]    Clinical Course User Index [EW] Daleen Bo, MD   MDM Rules/Calculators/A&P                       Patient Vitals for the past 24 hrs:  BP Temp Temp src Pulse Resp SpO2 Height Weight  11/29/19 1100 (!) 174/65 -- -- 98 -- 100 % -- --  11/29/19 1045 -- -- -- 90 -- 100 % -- --  11/29/19 1030 (!) 168/48 -- -- -- -- -- -- --  11/29/19 1015 -- -- -- 94 -- 99 % -- --  11/29/19 1000 (!) 168/61 -- -- 90 -- 100 % -- --  11/29/19 0930 (!) 170/85 -- -- (!) 102 -- 98 % -- --  11/29/19 0903 -- -- -- -- -- -- 5\' 3"  (1.6 m) 77.1 kg  11/29/19 0859 (!) 167/67 98.7 F (37.1 C) Oral (!) 106 17 100 % -- --    1:28 PM Reevaluation with update and discussion. After initial assessment and treatment, an updated evaluation reveals she remains comfortable and has no additional complaints.  Findings discussed and questions answered. Daleen Bo   Medical Decision Making: Low back pain, atraumatic, started when rolling over in bed.  Suspect muscle spasm causing her discomfort.  Exact etiology of pain is not clear.  Doubt spinal myelopathy.  Doubt fracture.  Doris Jefferson was evaluated in Emergency Department on 11/29/2019 for the symptoms described in the history of present illness. She was evaluated in the context of the global COVID-19 pandemic, which necessitated consideration that the patient might be at risk for infection with the SARS-CoV-2 virus that causes COVID-19. Institutional protocols and algorithms that pertain to the evaluation of patients at risk for COVID-19 are in a state of rapid change based on information released by regulatory bodies including the CDC and federal and  state organizations. These policies and algorithms were followed during the patient's care in the ED.   CRITICAL CARE-no Performed by: Daleen Bo   Nursing Notes Reviewed/ Care Coordinated Applicable Imaging Reviewed Interpretation of Laboratory Data incorporated into ED treatment  The patient appears reasonably screened and/or stabilized for discharge and I doubt any other medical condition or other [facility] requiring further screening, evaluation, or treatment in the ED at this time prior to discharge.  Plan: Home Medications-continue usual; Home Treatments-rest, fluids; return here if the recommended treatment, does not improve the symptoms; Recommended follow up-PCP, as needed    Final Clinical Impression(s) / ED Diagnoses Final diagnoses:  Acute bilateral low back pain without sciatica    Rx / DC Orders ED Discharge Orders         Ordered    traMADol (ULTRAM) 50 MG tablet  Every 6 hours PRN     11/29/19 1328    LORazepam (ATIVAN) 0.5 MG tablet  3 times daily PRN     11/29/19 1328           Daleen Bo, MD 11/29/19 1329

## 2019-11-29 NOTE — ED Triage Notes (Signed)
Patient states she woke up around midnight and had terrible lower back pain. Patient says pain feels like a bunch of knives stabbing her lower back. Pain rated 10/10.

## 2019-12-01 ENCOUNTER — Ambulatory Visit
Admission: RE | Admit: 2019-12-01 | Discharge: 2019-12-01 | Disposition: A | Discharge status: Home / Self Care | Payer: Medicare Other | Source: Ambulatory Visit | Attending: Family Medicine | Admitting: Family Medicine

## 2019-12-01 ENCOUNTER — Other Ambulatory Visit: Payer: Self-pay | Admitting: Family Medicine

## 2019-12-01 ENCOUNTER — Other Ambulatory Visit: Payer: Self-pay

## 2019-12-01 DIAGNOSIS — M545 Low back pain, unspecified: Secondary | ICD-10-CM

## 2019-12-02 ENCOUNTER — Inpatient Hospital Stay (HOSPITAL_COMMUNITY)
Admission: EM | Admit: 2019-12-02 | Discharge: 2019-12-09 | DRG: 540 | Disposition: A | Discharge status: Home Health | Payer: Medicare Other | Attending: Internal Medicine | Admitting: Internal Medicine

## 2019-12-02 ENCOUNTER — Other Ambulatory Visit: Payer: Self-pay

## 2019-12-02 ENCOUNTER — Emergency Department (HOSPITAL_COMMUNITY): Payer: Medicare Other

## 2019-12-02 ENCOUNTER — Encounter (HOSPITAL_COMMUNITY): Payer: Self-pay | Admitting: *Deleted

## 2019-12-02 DIAGNOSIS — M4625 Osteomyelitis of vertebra, thoracolumbar region: Secondary | ICD-10-CM | POA: Diagnosis not present

## 2019-12-02 DIAGNOSIS — E1122 Type 2 diabetes mellitus with diabetic chronic kidney disease: Secondary | ICD-10-CM | POA: Diagnosis present

## 2019-12-02 DIAGNOSIS — B955 Unspecified streptococcus as the cause of diseases classified elsewhere: Secondary | ICD-10-CM | POA: Diagnosis present

## 2019-12-02 DIAGNOSIS — Z85828 Personal history of other malignant neoplasm of skin: Secondary | ICD-10-CM

## 2019-12-02 DIAGNOSIS — Z20822 Contact with and (suspected) exposure to covid-19: Secondary | ICD-10-CM | POA: Diagnosis present

## 2019-12-02 DIAGNOSIS — M545 Low back pain, unspecified: Secondary | ICD-10-CM

## 2019-12-02 DIAGNOSIS — Z951 Presence of aortocoronary bypass graft: Secondary | ICD-10-CM

## 2019-12-02 DIAGNOSIS — M4646 Discitis, unspecified, lumbar region: Secondary | ICD-10-CM | POA: Diagnosis present

## 2019-12-02 DIAGNOSIS — E1169 Type 2 diabetes mellitus with other specified complication: Secondary | ICD-10-CM | POA: Diagnosis present

## 2019-12-02 DIAGNOSIS — E860 Dehydration: Secondary | ICD-10-CM

## 2019-12-02 DIAGNOSIS — I2510 Atherosclerotic heart disease of native coronary artery without angina pectoris: Secondary | ICD-10-CM | POA: Diagnosis present

## 2019-12-02 DIAGNOSIS — I272 Pulmonary hypertension, unspecified: Secondary | ICD-10-CM | POA: Diagnosis present

## 2019-12-02 DIAGNOSIS — I10 Essential (primary) hypertension: Secondary | ICD-10-CM | POA: Diagnosis present

## 2019-12-02 DIAGNOSIS — K5900 Constipation, unspecified: Secondary | ICD-10-CM | POA: Diagnosis present

## 2019-12-02 DIAGNOSIS — R7881 Bacteremia: Secondary | ICD-10-CM | POA: Diagnosis present

## 2019-12-02 DIAGNOSIS — F329 Major depressive disorder, single episode, unspecified: Secondary | ICD-10-CM | POA: Diagnosis present

## 2019-12-02 DIAGNOSIS — M549 Dorsalgia, unspecified: Secondary | ICD-10-CM | POA: Diagnosis present

## 2019-12-02 DIAGNOSIS — E785 Hyperlipidemia, unspecified: Secondary | ICD-10-CM | POA: Diagnosis present

## 2019-12-02 DIAGNOSIS — Z1611 Resistance to penicillins: Secondary | ICD-10-CM | POA: Diagnosis present

## 2019-12-02 DIAGNOSIS — R52 Pain, unspecified: Secondary | ICD-10-CM | POA: Diagnosis present

## 2019-12-02 DIAGNOSIS — Z953 Presence of xenogenic heart valve: Secondary | ICD-10-CM

## 2019-12-02 DIAGNOSIS — Z6830 Body mass index (BMI) 30.0-30.9, adult: Secondary | ICD-10-CM

## 2019-12-02 DIAGNOSIS — I129 Hypertensive chronic kidney disease with stage 1 through stage 4 chronic kidney disease, or unspecified chronic kidney disease: Secondary | ICD-10-CM | POA: Diagnosis present

## 2019-12-02 DIAGNOSIS — I480 Paroxysmal atrial fibrillation: Secondary | ICD-10-CM | POA: Diagnosis present

## 2019-12-02 DIAGNOSIS — Z7982 Long term (current) use of aspirin: Secondary | ICD-10-CM

## 2019-12-02 DIAGNOSIS — Z79899 Other long term (current) drug therapy: Secondary | ICD-10-CM

## 2019-12-02 DIAGNOSIS — M48061 Spinal stenosis, lumbar region without neurogenic claudication: Secondary | ICD-10-CM | POA: Diagnosis present

## 2019-12-02 DIAGNOSIS — M4805 Spinal stenosis, thoracolumbar region: Secondary | ICD-10-CM | POA: Diagnosis present

## 2019-12-02 DIAGNOSIS — Z90710 Acquired absence of both cervix and uterus: Secondary | ICD-10-CM

## 2019-12-02 DIAGNOSIS — N1832 Chronic kidney disease, stage 3b: Secondary | ICD-10-CM | POA: Diagnosis present

## 2019-12-02 DIAGNOSIS — Z952 Presence of prosthetic heart valve: Secondary | ICD-10-CM

## 2019-12-02 DIAGNOSIS — E119 Type 2 diabetes mellitus without complications: Secondary | ICD-10-CM

## 2019-12-02 DIAGNOSIS — M869 Osteomyelitis, unspecified: Secondary | ICD-10-CM

## 2019-12-02 DIAGNOSIS — K219 Gastro-esophageal reflux disease without esophagitis: Secondary | ICD-10-CM | POA: Diagnosis present

## 2019-12-02 DIAGNOSIS — G4733 Obstructive sleep apnea (adult) (pediatric): Secondary | ICD-10-CM | POA: Diagnosis present

## 2019-12-02 DIAGNOSIS — Z7984 Long term (current) use of oral hypoglycemic drugs: Secondary | ICD-10-CM

## 2019-12-02 DIAGNOSIS — E669 Obesity, unspecified: Secondary | ICD-10-CM | POA: Diagnosis present

## 2019-12-02 LAB — COMPREHENSIVE METABOLIC PANEL
ALT: 16 U/L (ref 0–44)
AST: 21 U/L (ref 15–41)
Albumin: 3.2 g/dL — ABNORMAL LOW (ref 3.5–5.0)
Alkaline Phosphatase: 82 U/L (ref 38–126)
Anion gap: 18 — ABNORMAL HIGH (ref 5–15)
BUN: 34 mg/dL — ABNORMAL HIGH (ref 8–23)
CO2: 16 mmol/L — ABNORMAL LOW (ref 22–32)
Calcium: 9.1 mg/dL (ref 8.9–10.3)
Chloride: 106 mmol/L (ref 98–111)
Creatinine, Ser: 1.4 mg/dL — ABNORMAL HIGH (ref 0.44–1.00)
GFR calc Af Amer: 42 mL/min — ABNORMAL LOW (ref >60)
GFR calc non Af Amer: 36 mL/min — ABNORMAL LOW (ref >60)
Glucose, Bld: 210 mg/dL — ABNORMAL HIGH (ref 70–99)
Potassium: 3.8 mmol/L (ref 3.5–5.1)
Sodium: 140 mmol/L (ref 135–145)
Total Bilirubin: 1.2 mg/dL (ref 0.3–1.2)
Total Protein: 6.9 g/dL (ref 6.5–8.1)

## 2019-12-02 LAB — CBC
HCT: 33.4 % — ABNORMAL LOW (ref 36.0–46.0)
Hemoglobin: 10.3 g/dL — ABNORMAL LOW (ref 12.0–15.0)
MCH: 27.2 pg (ref 26.0–34.0)
MCHC: 30.8 g/dL (ref 30.0–36.0)
MCV: 88.1 fL (ref 80.0–100.0)
Platelets: 223 10*3/uL (ref 150–400)
RBC: 3.79 MIL/uL — ABNORMAL LOW (ref 3.87–5.11)
RDW: 13.9 % (ref 11.5–15.5)
WBC: 13.1 10*3/uL — ABNORMAL HIGH (ref 4.0–10.5)
nRBC: 0 % (ref 0.0–0.2)

## 2019-12-02 LAB — CBG MONITORING, ED: Glucose-Capillary: 219 mg/dL — ABNORMAL HIGH (ref 70–99)

## 2019-12-02 MED ORDER — SODIUM CHLORIDE (PF) 0.9 % IJ SOLN
INTRAMUSCULAR | Status: AC
  Filled 2019-12-02: qty 50

## 2019-12-02 MED ORDER — OXYCODONE HCL 5 MG PO TABS
5.0000 mg | ORAL_TABLET | Freq: Once | ORAL | Status: AC
  Administered 2019-12-03: 01:00:00 5 mg via ORAL
  Filled 2019-12-02: qty 1

## 2019-12-02 MED ORDER — HYDROMORPHONE HCL 1 MG/ML IJ SOLN
0.5000 mg | Freq: Once | INTRAMUSCULAR | Status: AC
  Administered 2019-12-02: 21:00:00 0.5 mg via INTRAVENOUS
  Filled 2019-12-02: qty 1

## 2019-12-02 MED ORDER — IOHEXOL 300 MG/ML  SOLN
100.0000 mL | Freq: Once | INTRAMUSCULAR | Status: AC | PRN
  Administered 2019-12-02: 80 mL via INTRAVENOUS

## 2019-12-02 MED ORDER — SODIUM CHLORIDE 0.9 % IV BOLUS
1000.0000 mL | Freq: Once | INTRAVENOUS | Status: AC
  Administered 2019-12-03: 1000 mL via INTRAVENOUS

## 2019-12-02 NOTE — ED Triage Notes (Signed)
Pt states she went to orthopedics today due to back pain and was sent here for "shob, uncontrolled diabetes, and pain control" CBG 219

## 2019-12-02 NOTE — ED Provider Notes (Signed)
[facility] Emergency Department Provider Note MRN:  [id number]  Arrival date & time: 12/03/19     Chief Complaint   Back pain History of Present Illness   Doris Jefferson is a 79 y.o. year-old female with a history of diabetes, hypertension presenting to the ED with chief complaint of back pain.  Few days ago patient twisted her body to get out of bed and experienced severe sudden onset middle and lower back pain.  Worse with motion.  Evaluated here in the emergency department, x-rays performed, discharged.  Continues to have pain and trouble ambulating without assistance.  Followed up with orthopedics today, who are concerned about her pain and breathing difficulties and sent her here.  Patient explains that she is breathing rapidly due to the discomfort in her back.  Denies chest pain, no bowel or bladder dysfunction, no numbness or weakness.  Pain continues to be moderate to severe.  Review of Systems  A complete 10 system review of systems was obtained and all systems are negative except as noted in the HPI and PMH.   Patient's Health History    Past Medical History:  Diagnosis Date  . Coronary artery disease   . Depression   . GERD (gastroesophageal reflux disease)   . Heart murmur   . HTN (hypertension)   . Hyperlipidemia   . Iron deficiency anemia   . Obesity (BMI 30-39.9) 03/04/2014  . OSA on CPAP    severe with AHI 31/hr  . PAF (paroxysmal atrial fibrillation) ([place])   . Pneumonia    "twice; years ago" (09/24/2018)  . Skin cancer    "left neck; burned off"  . Type II diabetes mellitus ([place])     Past Surgical History:  Procedure Laterality Date  . ABDOMINAL HYSTERECTOMY    . ANTERIOR CERVICAL DECOMP/DISCECTOMY FUSION     "cage around my neck; Dr. Erline Levine"  . AORTIC VALVE REPLACEMENT N/A 09/27/2018   Procedure: AORTIC VALVE REPLACEMENT (AVR) USING MAGNA EASE SIZE 21 MM;  Surgeon: Grace Isaac, MD;  Location: [place];  Service: Open  Heart Surgery;  Laterality: N/A;  . BACK SURGERY    . CARDIAC CATHETERIZATION  09/24/2018  . CARDIOVERSION N/A 12/19/2013   Procedure: CARDIOVERSION;  Surgeon: Sinclair Grooms, MD;  Location: [facility] ENDOSCOPY;  Service: Cardiovascular;  Laterality: N/A;  . CARDIOVERSION N/A 08/16/2015   Procedure: CARDIOVERSION;  Surgeon: Larey Dresser, MD;  Location: [place];  Service: Cardiovascular;  Laterality: N/A;  . CARDIOVERSION N/A 09/13/2015   Procedure: CARDIOVERSION;  Surgeon: Sueanne Margarita, MD;  Location: [facility] ENDOSCOPY;  Service: Cardiovascular;  Laterality: N/A;  . CARDIOVERSION N/A 10/07/2018   Procedure: CARDIOVERSION;  Surgeon: Sanda Klein, MD;  Location: [facility] ENDOSCOPY;  Service: Cardiovascular;  Laterality: N/A;  . CATARACT EXTRACTION W/ INTRAOCULAR LENS  IMPLANT, BILATERAL Bilateral   . CLIPPING OF ATRIAL APPENDAGE N/A 09/27/2018   Procedure: CLIPPING OF ATRIAL APPENDAGE USING ATRICURE FLEXV SIZE 35MM;  Surgeon: Grace Isaac, MD;  Location: [place];  Service: Open Heart Surgery;  Laterality: N/A;  . COLONOSCOPY WITH PROPOFOL N/A 11/13/2017   Procedure: COLONOSCOPY WITH PROPOFOL;  Surgeon: Wilford Corner, MD;  Location: [facility] ENDOSCOPY;  Service: Endoscopy;  Laterality: N/A;  ANTIBIOTIC NEEDED BEFORE PROCEDURE  . CORONARY ARTERY BYPASS GRAFT N/A 09/27/2018   Procedure: CORONARY ARTERY BYPASS GRAFTING (CABG) x three, using left internal mammary artery and right leg greater saphenous vein harvested endoscopically;  Surgeon: Grace Isaac, MD;  Location: [facility]  OR;  Service: Open Heart Surgery;  Laterality: N/A;  . ESOPHAGOGASTRODUODENOSCOPY (EGD) WITH PROPOFOL N/A 11/13/2017   Procedure: ESOPHAGOGASTRODUODENOSCOPY (EGD) WITH PROPOFOL;  Surgeon: Wilford Corner, MD;  Location: [facility] ENDOSCOPY;  Service: Endoscopy;  Laterality: N/A;  . EYE SURGERY    . RIGHT/LEFT HEART CATH AND CORONARY ANGIOGRAPHY N/A 09/24/2018   Procedure: RIGHT/LEFT HEART CATH AND CORONARY ANGIOGRAPHY;  Surgeon: Jettie Booze, MD;  Location: [place] CV LAB;  Service: Cardiovascular;  Laterality: N/A;  . TEE WITHOUT CARDIOVERSION N/A 09/27/2018   Procedure: TRANSESOPHAGEAL ECHOCARDIOGRAM (TEE);  Surgeon: Grace Isaac, MD;  Location: [place];  Service: Open Heart Surgery;  Laterality: N/A;  . TUBAL LIGATION      Family History  Problem Relation Age of Onset  . Hypertension Father   . Heart disease Father   . Melanoma Mother   . Hypertension Brother     Social History   Socioeconomic History  . Marital status: Married    Spouse name: Thomsa  . Number of children: Not on file  . Years of education: 56  . Highest education level: High school graduate  Occupational History  . Occupation: Retired  Tobacco Use  . Smoking status: Never Smoker  . Smokeless tobacco: Never Used  Substance and Sexual Activity  . Alcohol use: Never  . Drug use: Never  . Sexual activity: Not Currently  Other Topics Concern  . Not on file  Social History Narrative  . Not on file   Social Determinants of Health   Financial Resource Strain: Low Risk   . Difficulty of Paying Living Expenses: Not hard at all  Food Insecurity: No Food Insecurity  . Worried About Charity fundraiser in the Last Year: Never true  . Ran Out of Food in the Last Year: Never true  Transportation Needs: No Transportation Needs  . Lack of Transportation (Medical): No  . Lack of Transportation (Non-Medical): No  Physical Activity: Insufficiently Active  . Days of Exercise per Week: 3 days  . Minutes of Exercise per Session: 10 min  Stress: No Stress Concern Present  . Feeling of Stress : Not at all  Social Connections:   . Frequency of Communication with Friends and Family: Not on file  . Frequency of Social Gatherings with Friends and Family: Not on file  . Attends Religious Services: Not on file  . Active Member of Clubs or Organizations: Not on file  . Attends Archivist Meetings: Not on file  . Marital Status: Not  on file  Intimate Partner Violence:   . Fear of Current or Ex-Partner: Not on file  . Emotionally Abused: Not on file  . Physically Abused: Not on file  . Sexually Abused: Not on file     Physical Exam   Vitals:   12/02/19 2330 12/03/19 0038  BP: (!) 107/41 (!) 159/71  Pulse: (!) 108 (!) 111  Resp:  18  Temp:    SpO2: 98% 98%    CONSTITUTIONAL: Well-appearing, NAD NEURO:  Alert and oriented x 3, normal and symmetric strength and sensation, normal coordination, normal speech EYES:  eyes equal and reactive ENT/NECK:  no LAD, no JVD CARDIO: Regular rate, well-perfused, normal S1 and S2 PULM:  CTAB no wheezing or rhonchi GI/GU:  normal bowel sounds, non-distended, non-tender MSK/SPINE:  No gross deformities, no edema SKIN:  no rash, atraumatic PSYCH:  Appropriate speech and behavior  *Additional and/or pertinent findings included in MDM below  Diagnostic and Interventional Summary  EKG Interpretation  Date/Time:  Tuesday December 02 2019 17:05:47 EST Ventricular Rate:  104 PR Interval:    QRS Duration: 116 QT Interval:  372 QTC Calculation: 490 R Axis:   -42 Text Interpretation: Sinus tachycardia Nonspecific IVCD with LAD LVH with secondary repolarization abnormality Confirmed by Gerlene Fee [phone]) on 12/02/2019 8:12:42 PM      Cardiac Monitoring Interpretation:  Labs Reviewed  CBC - Abnormal; Notable for the following components:      Result Value   WBC 13.1 (*)    RBC 3.79 (*)    Hemoglobin 10.3 (*)    HCT 33.4 (*)    All other components within normal limits  COMPREHENSIVE METABOLIC PANEL - Abnormal; Notable for the following components:   CO2 16 (*)    Glucose, Bld 210 (*)    BUN 34 (*)    Creatinine, Ser 1.40 (*)    Albumin 3.2 (*)    GFR calc non Af Amer 36 (*)    GFR calc Af Amer 42 (*)    Anion gap 18 (*)    All other components within normal limits  CBG MONITORING, ED - Abnormal; Notable for the following components:   Glucose-Capillary 219  (*)    All other components within normal limits  SARS CORONAVIRUS 2 (TAT 6-24 HRS)  URINALYSIS, ROUTINE W REFLEX MICROSCOPIC    CT ABDOMEN PELVIS W CONTRAST  Final Result    DG Chest 2 View  Final Result      Medications  sodium chloride (PF) 0.9 % injection (has no administration in time range)  HYDROmorphone (DILAUDID) injection 0.5 mg (0.5 mg Intravenous Given 12/02/19 2051)  iohexol (OMNIPAQUE) 300 MG/ML solution 100 mL (80 mLs Intravenous Contrast Given 12/02/19 2221)  oxyCODONE (Oxy IR/ROXICODONE) immediate release tablet 5 mg (5 mg Oral Given 12/03/19 0032)  sodium chloride 0.9 % bolus 1,000 mL (1,000 mLs Intravenous New Bag/Given 12/03/19 0033)     Procedures  /  Critical Care Procedures  ED Course and Medical Decision Making  I have reviewed the triage vital signs, the nursing notes, and pertinent available records from the EMR.  Pertinent labs & imaging results that were available during my care of the patient were reviewed by me and considered in my medical decision making (see below for details).     Suspect musculoskeletal pain, no red flag symptoms to suggest myelopathy.  Patient also endorsing some abdominal pain, had transient soft blood pressure on arrival, will obtain CT imaging of the abdomen, pelvis, spine to get a better idea of her possible compression fractures, image of the aorta.  Labs reveal gap acidosis.  Patient's husband explains that she has not eaten in 3 days due to the pain.  She continues to barely be able to walk.  CT scan is without acute process, does demonstrate hernia that she explains has been there for years, it is nontender, she is moving her bowels normally.  Will admit to hospitalist service for observation.  Barth Kirks. [place] Small, [place] [email or web address]  Final Clinical Impressions(s) / ED Diagnoses     ICD-10-CM   1. Dehydration  E86.0   2. Acute bilateral low back pain without  sciatica  M54.5     ED Discharge Orders    None       Discharge Instructions Discussed with and Provided to Patient:   Discharge Instructions   None       Maudie Flakes, MD 12/03/19 [id number]

## 2019-12-03 ENCOUNTER — Encounter (HOSPITAL_COMMUNITY): Payer: Self-pay | Admitting: Internal Medicine

## 2019-12-03 DIAGNOSIS — M48061 Spinal stenosis, lumbar region without neurogenic claudication: Secondary | ICD-10-CM | POA: Diagnosis present

## 2019-12-03 DIAGNOSIS — G4733 Obstructive sleep apnea (adult) (pediatric): Secondary | ICD-10-CM | POA: Diagnosis present

## 2019-12-03 DIAGNOSIS — E0800 Diabetes mellitus due to underlying condition with hyperosmolarity without nonketotic hyperglycemic-hyperosmolar coma (NKHHC): Secondary | ICD-10-CM | POA: Diagnosis not present

## 2019-12-03 DIAGNOSIS — I361 Nonrheumatic tricuspid (valve) insufficiency: Secondary | ICD-10-CM | POA: Diagnosis not present

## 2019-12-03 DIAGNOSIS — R52 Pain, unspecified: Secondary | ICD-10-CM | POA: Diagnosis present

## 2019-12-03 DIAGNOSIS — G061 Intraspinal abscess and granuloma: Secondary | ICD-10-CM | POA: Diagnosis not present

## 2019-12-03 DIAGNOSIS — M549 Dorsalgia, unspecified: Secondary | ICD-10-CM | POA: Diagnosis present

## 2019-12-03 DIAGNOSIS — M4645 Discitis, unspecified, thoracolumbar region: Secondary | ICD-10-CM | POA: Diagnosis not present

## 2019-12-03 DIAGNOSIS — M545 Low back pain: Secondary | ICD-10-CM | POA: Diagnosis not present

## 2019-12-03 DIAGNOSIS — E785 Hyperlipidemia, unspecified: Secondary | ICD-10-CM | POA: Diagnosis present

## 2019-12-03 DIAGNOSIS — Z7984 Long term (current) use of oral hypoglycemic drugs: Secondary | ICD-10-CM | POA: Diagnosis not present

## 2019-12-03 DIAGNOSIS — Z951 Presence of aortocoronary bypass graft: Secondary | ICD-10-CM | POA: Diagnosis not present

## 2019-12-03 DIAGNOSIS — R7881 Bacteremia: Secondary | ICD-10-CM | POA: Diagnosis present

## 2019-12-03 DIAGNOSIS — Z90710 Acquired absence of both cervix and uterus: Secondary | ICD-10-CM | POA: Diagnosis not present

## 2019-12-03 DIAGNOSIS — Z953 Presence of xenogenic heart valve: Secondary | ICD-10-CM | POA: Diagnosis not present

## 2019-12-03 DIAGNOSIS — E1122 Type 2 diabetes mellitus with diabetic chronic kidney disease: Secondary | ICD-10-CM | POA: Diagnosis present

## 2019-12-03 DIAGNOSIS — Z20822 Contact with and (suspected) exposure to covid-19: Secondary | ICD-10-CM | POA: Diagnosis present

## 2019-12-03 DIAGNOSIS — K219 Gastro-esophageal reflux disease without esophagitis: Secondary | ICD-10-CM | POA: Diagnosis present

## 2019-12-03 DIAGNOSIS — M5440 Lumbago with sciatica, unspecified side: Secondary | ICD-10-CM | POA: Diagnosis not present

## 2019-12-03 DIAGNOSIS — E1169 Type 2 diabetes mellitus with other specified complication: Secondary | ICD-10-CM | POA: Diagnosis not present

## 2019-12-03 DIAGNOSIS — Z85828 Personal history of other malignant neoplasm of skin: Secondary | ICD-10-CM | POA: Diagnosis not present

## 2019-12-03 DIAGNOSIS — M4646 Discitis, unspecified, lumbar region: Secondary | ICD-10-CM | POA: Diagnosis present

## 2019-12-03 DIAGNOSIS — E860 Dehydration: Secondary | ICD-10-CM | POA: Diagnosis present

## 2019-12-03 DIAGNOSIS — Z79899 Other long term (current) drug therapy: Secondary | ICD-10-CM | POA: Diagnosis not present

## 2019-12-03 DIAGNOSIS — M4625 Osteomyelitis of vertebra, thoracolumbar region: Secondary | ICD-10-CM | POA: Diagnosis present

## 2019-12-03 DIAGNOSIS — F329 Major depressive disorder, single episode, unspecified: Secondary | ICD-10-CM | POA: Diagnosis present

## 2019-12-03 DIAGNOSIS — E119 Type 2 diabetes mellitus without complications: Secondary | ICD-10-CM | POA: Diagnosis not present

## 2019-12-03 DIAGNOSIS — E872 Acidosis: Secondary | ICD-10-CM | POA: Diagnosis not present

## 2019-12-03 DIAGNOSIS — I2510 Atherosclerotic heart disease of native coronary artery without angina pectoris: Secondary | ICD-10-CM | POA: Diagnosis present

## 2019-12-03 DIAGNOSIS — B954 Other streptococcus as the cause of diseases classified elsewhere: Secondary | ICD-10-CM | POA: Diagnosis not present

## 2019-12-03 DIAGNOSIS — I272 Pulmonary hypertension, unspecified: Secondary | ICD-10-CM | POA: Diagnosis present

## 2019-12-03 DIAGNOSIS — M4620 Osteomyelitis of vertebra, site unspecified: Secondary | ICD-10-CM | POA: Diagnosis not present

## 2019-12-03 DIAGNOSIS — N1832 Chronic kidney disease, stage 3b: Secondary | ICD-10-CM | POA: Diagnosis present

## 2019-12-03 DIAGNOSIS — Z1611 Resistance to penicillins: Secondary | ICD-10-CM | POA: Diagnosis present

## 2019-12-03 DIAGNOSIS — Z7982 Long term (current) use of aspirin: Secondary | ICD-10-CM | POA: Diagnosis not present

## 2019-12-03 DIAGNOSIS — I10 Essential (primary) hypertension: Secondary | ICD-10-CM | POA: Diagnosis not present

## 2019-12-03 DIAGNOSIS — I340 Nonrheumatic mitral (valve) insufficiency: Secondary | ICD-10-CM | POA: Diagnosis not present

## 2019-12-03 DIAGNOSIS — I480 Paroxysmal atrial fibrillation: Secondary | ICD-10-CM | POA: Diagnosis present

## 2019-12-03 DIAGNOSIS — I350 Nonrheumatic aortic (valve) stenosis: Secondary | ICD-10-CM | POA: Diagnosis not present

## 2019-12-03 DIAGNOSIS — M4805 Spinal stenosis, thoracolumbar region: Secondary | ICD-10-CM | POA: Diagnosis not present

## 2019-12-03 DIAGNOSIS — I129 Hypertensive chronic kidney disease with stage 1 through stage 4 chronic kidney disease, or unspecified chronic kidney disease: Secondary | ICD-10-CM | POA: Diagnosis present

## 2019-12-03 DIAGNOSIS — M4640 Discitis, unspecified, site unspecified: Secondary | ICD-10-CM | POA: Diagnosis not present

## 2019-12-03 DIAGNOSIS — E669 Obesity, unspecified: Secondary | ICD-10-CM | POA: Diagnosis present

## 2019-12-03 LAB — GLUCOSE, CAPILLARY
Glucose-Capillary: 159 mg/dL — ABNORMAL HIGH (ref 70–99)
Glucose-Capillary: 155 mg/dL — ABNORMAL HIGH (ref 70–99)
Glucose-Capillary: 163 mg/dL — ABNORMAL HIGH (ref 70–99)
Glucose-Capillary: 180 mg/dL — ABNORMAL HIGH (ref 70–99)
Glucose-Capillary: 180 mg/dL — ABNORMAL HIGH (ref 70–99)

## 2019-12-03 LAB — BLOOD GAS, VENOUS
Acid-base deficit: 5.9 mmol/L — ABNORMAL HIGH (ref 0.0–2.0)
Bicarbonate: 18.9 mmol/L — ABNORMAL LOW (ref 20.0–28.0)
FIO2: 21
O2 Saturation: 31.8 %
Patient temperature: 37
pCO2, Ven: 37.2 mmHg — ABNORMAL LOW (ref 44.0–60.0)
pH, Ven: 7.327 (ref 7.250–7.430)
pO2, Ven: <31 mmHg — CL (ref 32.0–45.0)

## 2019-12-03 LAB — BASIC METABOLIC PANEL
Anion gap: 16 — ABNORMAL HIGH (ref 5–15)
BUN: 31 mg/dL — ABNORMAL HIGH (ref 8–23)
CO2: 16 mmol/L — ABNORMAL LOW (ref 22–32)
Calcium: 8.8 mg/dL — ABNORMAL LOW (ref 8.9–10.3)
Chloride: 110 mmol/L (ref 98–111)
Creatinine, Ser: 1.29 mg/dL — ABNORMAL HIGH (ref 0.44–1.00)
GFR calc Af Amer: 46 mL/min — ABNORMAL LOW (ref >60)
GFR calc non Af Amer: 40 mL/min — ABNORMAL LOW (ref >60)
Glucose, Bld: 175 mg/dL — ABNORMAL HIGH (ref 70–99)
Potassium: 3.9 mmol/L (ref 3.5–5.1)
Sodium: 142 mmol/L (ref 135–145)

## 2019-12-03 LAB — CBC
HCT: 32.3 % — ABNORMAL LOW (ref 36.0–46.0)
Hemoglobin: 10.1 g/dL — ABNORMAL LOW (ref 12.0–15.0)
MCH: 27.7 pg (ref 26.0–34.0)
MCHC: 31.3 g/dL (ref 30.0–36.0)
MCV: 88.7 fL (ref 80.0–100.0)
Platelets: 217 10*3/uL (ref 150–400)
RBC: 3.64 MIL/uL — ABNORMAL LOW (ref 3.87–5.11)
RDW: 14.2 % (ref 11.5–15.5)
WBC: 13.9 10*3/uL — ABNORMAL HIGH (ref 4.0–10.5)
nRBC: 0 % (ref 0.0–0.2)

## 2019-12-03 LAB — HEMOGLOBIN A1C
Hgb A1c MFr Bld: 6.9 % — ABNORMAL HIGH (ref 4.8–5.6)
Mean Plasma Glucose: 151.33 mg/dL

## 2019-12-03 LAB — SARS CORONAVIRUS 2 (TAT 6-24 HRS): SARS Coronavirus 2: NEGATIVE

## 2019-12-03 LAB — BETA-HYDROXYBUTYRIC ACID: Beta-Hydroxybutyric Acid: 5.26 mmol/L — ABNORMAL HIGH (ref 0.05–0.27)

## 2019-12-03 MED ORDER — SODIUM CHLORIDE 0.9% FLUSH
3.0000 mL | Freq: Two times a day (BID) | INTRAVENOUS | Status: DC
  Administered 2019-12-03 – 2019-12-08 (×9): 3 mL via INTRAVENOUS

## 2019-12-03 MED ORDER — INSULIN ASPART 100 UNIT/ML ~~LOC~~ SOLN
0.0000 [IU] | Freq: Every day | SUBCUTANEOUS | Status: DC
  Administered 2019-12-06: 22:00:00 2 [IU] via SUBCUTANEOUS

## 2019-12-03 MED ORDER — ENOXAPARIN SODIUM 40 MG/0.4ML ~~LOC~~ SOLN
40.0000 mg | SUBCUTANEOUS | Status: DC
  Administered 2019-12-03 – 2019-12-04 (×2): 40 mg via SUBCUTANEOUS
  Filled 2019-12-03 (×2): qty 0.4

## 2019-12-03 MED ORDER — ACETAMINOPHEN 650 MG RE SUPP: 650.0000 mg | Freq: Four times a day (QID) | RECTAL | Status: DC | PRN

## 2019-12-03 MED ORDER — CARVEDILOL 12.5 MG PO TABS
12.5000 mg | ORAL_TABLET | Freq: Two times a day (BID) | ORAL | Status: DC
  Administered 2019-12-03 – 2019-12-09 (×13): 12.5 mg via ORAL
  Filled 2019-12-03 (×13): qty 1

## 2019-12-03 MED ORDER — OXYCODONE HCL 5 MG PO TABS
5.0000 mg | ORAL_TABLET | Freq: Four times a day (QID) | ORAL | Status: DC | PRN
  Administered 2019-12-03: 5 mg via ORAL
  Filled 2019-12-03: qty 1

## 2019-12-03 MED ORDER — SODIUM CHLORIDE 0.9 % IV SOLN: INTRAVENOUS | Status: DC

## 2019-12-03 MED ORDER — INSULIN ASPART 100 UNIT/ML ~~LOC~~ SOLN
0.0000 [IU] | Freq: Three times a day (TID) | SUBCUTANEOUS | Status: DC
  Administered 2019-12-03 (×3): 2 [IU] via SUBCUTANEOUS
  Administered 2019-12-04: 1 [IU] via SUBCUTANEOUS
  Administered 2019-12-04: 2 [IU] via SUBCUTANEOUS
  Administered 2019-12-04 – 2019-12-06 (×2): 1 [IU] via SUBCUTANEOUS
  Administered 2019-12-06 – 2019-12-07 (×3): 2 [IU] via SUBCUTANEOUS
  Administered 2019-12-07: 1 [IU] via SUBCUTANEOUS
  Administered 2019-12-08: 2 [IU] via SUBCUTANEOUS
  Administered 2019-12-08: 17:00:00 1 [IU] via SUBCUTANEOUS
  Administered 2019-12-08: 2 [IU] via SUBCUTANEOUS
  Administered 2019-12-09: 1 [IU] via SUBCUTANEOUS

## 2019-12-03 MED ORDER — BUPROPION HCL ER (XL) 300 MG PO TB24
300.0000 mg | ORAL_TABLET | Freq: Every day | ORAL | Status: DC
  Administered 2019-12-03 – 2019-12-09 (×7): 300 mg via ORAL
  Filled 2019-12-03 (×7): qty 1

## 2019-12-03 MED ORDER — ACETAMINOPHEN 325 MG PO TABS
650.0000 mg | ORAL_TABLET | Freq: Four times a day (QID) | ORAL | Status: DC | PRN
  Filled 2019-12-03 (×2): qty 2

## 2019-12-03 MED ORDER — METHOCARBAMOL 500 MG PO TABS
1000.0000 mg | ORAL_TABLET | Freq: Three times a day (TID) | ORAL | Status: DC
  Administered 2019-12-03 – 2019-12-09 (×19): 1000 mg via ORAL
  Filled 2019-12-03 (×19): qty 2

## 2019-12-03 MED ORDER — OXYCODONE HCL 5 MG PO TABS
5.0000 mg | ORAL_TABLET | ORAL | Status: DC | PRN
  Administered 2019-12-03 – 2019-12-07 (×12): 5 mg via ORAL
  Filled 2019-12-03 (×13): qty 1

## 2019-12-03 MED ORDER — LORAZEPAM 0.5 MG PO TABS: 0.5000 mg | ORAL_TABLET | Freq: Three times a day (TID) | ORAL | Status: DC | PRN

## 2019-12-03 MED ORDER — LOPERAMIDE HCL 2 MG PO CAPS: 2.0000 mg | ORAL_CAPSULE | ORAL | Status: DC | PRN

## 2019-12-03 MED ORDER — ASPIRIN EC 81 MG PO TBEC
81.0000 mg | DELAYED_RELEASE_TABLET | Freq: Every day | ORAL | Status: DC
  Administered 2019-12-03 – 2019-12-09 (×7): 81 mg via ORAL
  Filled 2019-12-03 (×7): qty 1

## 2019-12-03 MED ORDER — ROSUVASTATIN CALCIUM 20 MG PO TABS
20.0000 mg | ORAL_TABLET | Freq: Every day | ORAL | Status: DC
  Administered 2019-12-03 – 2019-12-09 (×7): 20 mg via ORAL
  Filled 2019-12-03 (×7): qty 1

## 2019-12-03 MED ORDER — HYDROMORPHONE HCL 1 MG/ML IJ SOLN
0.5000 mg | INTRAMUSCULAR | Status: DC | PRN
  Administered 2019-12-03 – 2019-12-04 (×3): 0.5 mg via INTRAVENOUS
  Filled 2019-12-03 (×3): qty 0.5

## 2019-12-03 MED ORDER — SILDENAFIL CITRATE 20 MG PO TABS
20.0000 mg | ORAL_TABLET | Freq: Three times a day (TID) | ORAL | Status: DC
  Administered 2019-12-03 – 2019-12-09 (×18): 20 mg via ORAL
  Filled 2019-12-03 (×21): qty 1

## 2019-12-03 MED ORDER — ACETAMINOPHEN 325 MG PO TABS
650.0000 mg | ORAL_TABLET | Freq: Four times a day (QID) | ORAL | Status: DC | PRN
  Administered 2019-12-04 – 2019-12-05 (×2): 650 mg via ORAL

## 2019-12-03 MED ORDER — LORATADINE 10 MG PO TABS
10.0000 mg | ORAL_TABLET | Freq: Every day | ORAL | Status: DC
  Administered 2019-12-03 – 2019-12-09 (×7): 10 mg via ORAL
  Filled 2019-12-03 (×7): qty 1

## 2019-12-03 MED ORDER — DIAZEPAM 2 MG PO TABS
2.0000 mg | ORAL_TABLET | Freq: Once | ORAL | Status: AC
  Administered 2019-12-03: 15:00:00 2 mg via ORAL
  Filled 2019-12-03: qty 1

## 2019-12-03 NOTE — H&P (Signed)
History and Physical    Doris Jefferson [id number] DOB: 1940/10/30 DOA: 12/02/2019  PCP: Aletha Halim., PA-C   Patient coming from: Home  I have personally briefly reviewed patient's old medical records in [place]  Chief Complaint: Back pain  HPI: Doris Jefferson is a 79 y.o. female with medical history significant of HTN, HLD, T2DM, CAD s/p CABG, Obesity/OSA, Depression, GERD, Paroxysmal Atrial Fibrillation, and IDA who presents with back pain.  Patient reports she was in her [place] thru Saturday night when she turned awkwardly in her bed trying to get out to get up and use the bathroom and had sudden onset severe back pain.  She states it is very sharp and she did present to the ER due to the severe pain.  She denies any trauma, she had no falls, no recent injuries prior to going to bed that may have precipitated pain.  She states she once had this occur 25 years ago but has never had pain of this severity since.  She states she was sent home with Ativan and Tramadol but neither have assisted her since.  She states she has been able to occasionally get up out of bed with the assistance of her husband and has now been utilizing her walker which she previously had not.  She states she has not eaten since then due to lack of appetite in setting of severe pain but denies any nausea/vomiting or abdominal pain.  She denies any fevers, chills, chest pain, sob, cough.  Denies smoking, EtOH or Drugs  Lives at home with her husband    Review of Systems: As per HPI otherwise 10 point review of systems negative.    Past Medical History:  Diagnosis Date   Coronary artery disease    Depression    GERD (gastroesophageal reflux disease)    Heart murmur    HTN (hypertension)    Hyperlipidemia    Iron deficiency anemia    Obesity (BMI 30-39.9) 03/04/2014   OSA on CPAP    severe with AHI 31/hr   PAF (paroxysmal atrial fibrillation) ([place])    Pneumonia    "twice; years  ago" (09/24/2018)   Skin cancer    "left neck; burned off"   Type II diabetes mellitus ([place])     Past Surgical History:  Procedure Laterality Date   ABDOMINAL HYSTERECTOMY     ANTERIOR CERVICAL DECOMP/DISCECTOMY FUSION     "cage around my neck; Dr. Erline Levine"   AORTIC VALVE REPLACEMENT N/A 09/27/2018   Procedure: AORTIC VALVE REPLACEMENT (AVR) USING MAGNA EASE SIZE 21 MM;  Surgeon: Grace Isaac, MD;  Location: [place];  Service: Open Heart Surgery;  Laterality: N/A;   BACK SURGERY     CARDIAC CATHETERIZATION  09/24/2018   CARDIOVERSION N/A 12/19/2013   Procedure: CARDIOVERSION;  Surgeon: Sinclair Grooms, MD;  [facility] ENDOSCOPY;  Service: Cardiovascular;  Laterality: N/A;   CARDIOVERSION N/A 08/16/2015   Procedure: CARDIOVERSION;  Surgeon: Larey Dresser, MD;  Location: [place];  Service: Cardiovascular;  Laterality: N/A;   CARDIOVERSION N/A 09/13/2015   Procedure: CARDIOVERSION;  Surgeon: Sueanne Margarita, MD;  Location: [place] ENDOSCOPY;  Service: Cardiovascular;  Laterality: N/A;   CARDIOVERSION N/A 10/07/2018   Procedure: CARDIOVERSION;  Surgeon: Sanda Klein, MD;  Location: [place];  Service: Cardiovascular;  Laterality: N/A;   CATARACT EXTRACTION W/ INTRAOCULAR LENS  IMPLANT, BILATERAL Bilateral    CLIPPING OF ATRIAL APPENDAGE N/A 09/27/2018   Procedure: CLIPPING OF  ATRIAL APPENDAGE USING ATRICURE FLEXV SIZE 35MM;  Surgeon: Grace Isaac, MD;  Location: [place];  Service: Open Heart Surgery;  Laterality: N/A;   COLONOSCOPY WITH PROPOFOL N/A 11/13/2017   Procedure: COLONOSCOPY WITH PROPOFOL;  Surgeon: Wilford Corner, MD;  Location: [facility] ENDOSCOPY;  Service: Endoscopy;  Laterality: N/A;  ANTIBIOTIC NEEDED BEFORE PROCEDURE   CORONARY ARTERY BYPASS GRAFT N/A 09/27/2018   Procedure: CORONARY ARTERY BYPASS GRAFTING (CABG) x three, using left internal mammary artery and right leg greater saphenous vein harvested endoscopically;  Surgeon: Grace Isaac, MD;  Location: [place];  Service: Open Heart Surgery;  Laterality: N/A;   ESOPHAGOGASTRODUODENOSCOPY (EGD) WITH PROPOFOL N/A 11/13/2017   Procedure: ESOPHAGOGASTRODUODENOSCOPY (EGD) WITH PROPOFOL;  Surgeon: Wilford Corner, MD;  Location: [facility] ENDOSCOPY;  Service: Endoscopy;  Laterality: N/A;   EYE SURGERY     RIGHT/LEFT HEART CATH AND CORONARY ANGIOGRAPHY N/A 09/24/2018   Procedure: RIGHT/LEFT HEART CATH AND CORONARY ANGIOGRAPHY;  Surgeon: Jettie Booze, MD;  Location: [place] CV LAB;  Service: Cardiovascular;  Laterality: N/A;   TEE WITHOUT CARDIOVERSION N/A 09/27/2018   Procedure: TRANSESOPHAGEAL ECHOCARDIOGRAM (TEE);  Surgeon: Grace Isaac, MD;  Location: [place];  Service: Open Heart Surgery;  Laterality: N/A;   TUBAL LIGATION       reports that she has never smoked. She has never used smokeless tobacco. She reports that she does not drink alcohol or use drugs.  Allergies  Allergen Reactions   Minocin [Minocycline Hcl] Swelling and Other (See Comments)    THROAT SWELLING   Benazepril Cough   Hydrochlorothiazide Itching   Tape Rash    Family History  Problem Relation Age of Onset   Hypertension Father    Heart disease Father    Melanoma Mother    Hypertension Brother     Prior to Admission medications   Medication Sig Start Date End Date Taking? Authorizing Provider  acetaminophen (TYLENOL) 325 MG tablet Take 650 mg by mouth every 6 (six) hours as needed for moderate pain or headache.   Yes [provider]  aspirin EC 81 MG EC tablet Take 1 tablet (81 mg total) by mouth daily. 10/08/18  Yes Barrett, Erin R, PA-C  buPROPion (WELLBUTRIN XL) 300 MG 24 hr tablet Take 300 mg by mouth daily.  01/11/16  Yes [provider]  carvedilol (COREG) 12.5 MG tablet Take 12.5 mg by mouth 2 (two) times daily with a meal.   Yes [provider]  cetirizine (ZYRTEC) 10 MG tablet Take 10 mg by mouth as needed for allergies.   Yes [provider]  empagliflozin (JARDIANCE) 10 MG TABS tablet Take 10 mg by mouth daily before breakfast. 05/22/19  Yes Bensimhon, Shaune Pascal, MD  furosemide (LASIX) 20 MG tablet Take 1 tablet (20 mg total) by mouth daily. 09/15/19  Yes Belva Crome, MD  liraglutide (VICTOZA) 18 MG/3ML SOPN Inject 1.8 mg into the skin daily.    Yes [provider]  loperamide (IMODIUM) 2 MG capsule Take 1 capsule (2 mg total) by mouth as needed for diarrhea or loose stools. 10/09/18  Yes Conte, Tessa N, PA-C  MAGNESIUM-OXIDE 400 (241.3 Mg) MG tablet TAKE 1 TABLET BY MOUTH TWICE DAILY 07/04/19  Yes Belva Crome, MD  metFORMIN (GLUCOPHAGE) 1000 MG tablet Take 1,000 mg by mouth 2 (two) times a day. 01/01/19  Yes [provider]  Potassium (GNP POTASSIUM) 99 MG TABS Take 99 mg by mouth 3 (three) times daily.  Yes [provider]  rosuvastatin (CRESTOR) 20 MG tablet Take 20 mg by mouth daily.   Yes [provider]  sildenafil (REVATIO) 20 MG tablet Take 1 tablet (20 mg total) by mouth 3 (three) times daily. 10/09/18  Yes Conte, Tessa N, PA-C  carvedilol (COREG) 12.5 MG tablet Take 1 tablet (12.5 mg total) by mouth 2 (two) times daily. 06/04/19 11/29/19  Belva Crome, MD  LORazepam (ATIVAN) 0.5 MG tablet Take 1 tablet (0.5 mg total) by mouth 3 (three) times daily as needed (muscle spasm). Patient not taking: Reported on 12/02/2019 11/29/19   Daleen Bo, MD  traMADol (ULTRAM) 50 MG tablet Take 1 tablet (50 mg total) by mouth every 6 (six) hours as needed for moderate pain. Patient not taking: Reported on 12/02/2019 11/29/19   Daleen Bo, MD    Physical Exam: Vitals:   12/02/19 2015 12/02/19 2232 12/02/19 2330 12/03/19 0038  BP: (!) 151/83 (!) 151/57 (!) 107/41 (!) 159/71  Pulse: (!) 102 99 (!) 108 (!) 111  Resp: 18 19  18   Temp: 98.2 F (36.8 C)     TempSrc: Oral     SpO2: 100% 100% 98% 98%  Weight:      Height:        Vitals:   12/02/19 2015 12/02/19 2232 12/02/19 2330  12/03/19 0038  BP: (!) 151/83 (!) 151/57 (!) 107/41 (!) 159/71  Pulse: (!) 102 99 (!) 108 (!) 111  Resp: 18 19  18   Temp: 98.2 F (36.8 C)     TempSrc: Oral     SpO2: 100% 100% 98% 98%  Weight:      Height:         Constitutional: NAD, calm while laying on her R side Eyes: PERRL, lids and conjunctivae normal ENMT: Mucous membranes are moist. Posterior pharynx clear of any exudate or lesions.Normal dentition.  Neck: normal, supple, no masses, no thyromegaly Respiratory: clear to auscultation bilaterally, no wheezing, no crackles. Normal respiratory effort. No accessory muscle use.  Cardiovascular: Regular rate and rhythm, no murmurs / rubs / gallops. No extremity edema. 2+ pedal pulses. No carotid bruits.  Abdomen: no tenderness, no masses palpated. No hepatosplenomegaly. Bowel sounds positive.  Musculoskeletal: no clubbing / cyanosis. No joint deformity upper and lower extremities. Good ROM, no contractures. She has notable paraspinal muscle tenderness to her L side as well as R (L > R). Skin: no rashes, lesions, ulcers. No induration Neurologic: CN 2-12 grossly intact. Strength exam limited by pain and positioning.  No gross focal deficits appreciated Psychiatric: Normal judgment and insight. Alert and oriented x 3. Normal mood.    Labs on Admission: I have personally reviewed following labs and imaging studies  CBC: Recent Labs  Lab 12/02/19 2053  WBC 13.1*  HGB 10.3*  HCT 33.4*  MCV 88.1  PLT [id number]   Basic Metabolic Panel: Recent Labs  Lab 12/02/19 2053  NA 140  K 3.8  CL 106  CO2 16*  GLUCOSE 210*  BUN 34*  CREATININE 1.40*  CALCIUM 9.1   GFR: Estimated Creatinine Clearance: 32.6 mL/min (A) (by C-G formula based on SCr of 1.4 mg/dL (H)). Liver Function Tests: Recent Labs  Lab 12/02/19 2053  AST 21  ALT 16  ALKPHOS 82  BILITOT 1.2  PROT 6.9  ALBUMIN 3.2*   No results for input(s): LIPASE, AMYLASE in the last 168 hours. No results for input(s): AMMONIA  in the last 168 hours. Coagulation Profile: No results for input(s): INR, PROTIME  in the last 168 hours. Cardiac Enzymes: No results for input(s): CKTOTAL, CKMB, CKMBINDEX, TROPONINI in the last 168 hours. BNP (last 3 results) No results for input(s): PROBNP in the last 8760 hours. HbA1C: No results for input(s): HGBA1C in the last 72 hours. CBG: Recent Labs  Lab 12/02/19 1655  GLUCAP 219*   Lipid Profile: No results for input(s): CHOL, HDL, LDLCALC, TRIG, CHOLHDL, LDLDIRECT in the last 72 hours. Thyroid Function Tests: No results for input(s): TSH, T4TOTAL, FREET4, T3FREE, THYROIDAB in the last 72 hours. Anemia Panel: No results for input(s): VITAMINB12, FOLATE, FERRITIN, TIBC, IRON, RETICCTPCT in the last 72 hours. Urine analysis:    Component Value Date/Time   COLORURINE YELLOW 09/25/2018 1932   APPEARANCEUR CLEAR 09/25/2018 1932   LABSPEC 1.027 09/25/2018 1932   PHURINE 5.0 09/25/2018 1932   GLUCOSEU >=500 (A) 09/25/2018 1932   HGBUR NEGATIVE 09/25/2018 1932   BILIRUBINUR NEGATIVE 09/25/2018 1932   KETONESUR NEGATIVE 09/25/2018 1932   PROTEINUR NEGATIVE 09/25/2018 1932   NITRITE NEGATIVE 09/25/2018 1932   LEUKOCYTESUR NEGATIVE 09/25/2018 1932    Radiological Exams on Admission: DG Chest 2 View  Result Date: 12/02/2019 CLINICAL DATA:  79 year old female with shortness of breath EXAM: CHEST - 2 VIEW COMPARISON:  Chest radiograph dated 12/12/2018. FINDINGS: There is no focal consolidation, pleural effusion, or pneumothorax. Mild diffuse interstitial coarsening. The cardiac silhouette is within normal limits. Median sternotomy wires, mechanical heart valve and left atrial appendage occlusive device. No acute osseous pathology. Cervical ACDF. IMPRESSION: No active cardiopulmonary disease. Electronically Signed   By: Anner Crete M.D.   On: 12/02/2019 18:36   DG Lumbar Spine 2-3 Views  Result Date: 12/01/2019 CLINICAL DATA:  Low back pain for 2 days.  No known injury.  EXAM: LUMBAR SPINE - 2-3 VIEW COMPARISON:  None. FINDINGS: The patient has transitional lumbosacral anatomy with rudimentary ribs off what is presumed to be the first lumbar segment. No fracture or malalignment. Multilevel marked loss of disc space height appears worst at L1-2, L2-3 and L3-4. Extensive atherosclerosis is noted. IMPRESSION: No acute abnormality. Multilevel degenerative disease. Transitional lumbosacral anatomy. Atherosclerosis. Electronically Signed   By: Inge Rise M.D.   On: 12/01/2019 16:09   CT ABDOMEN PELVIS W CONTRAST  Result Date: 12/02/2019 CLINICAL DATA:  Abdominal pain, back pain, concern for compression deformity versus acute aortic process EXAM: CT ABDOMEN AND PELVIS WITH CONTRAST TECHNIQUE: Multidetector CT imaging of the abdomen and pelvis was performed using the standard protocol following bolus administration of intravenous contrast. CONTRAST:  36mL OMNIPAQUE IOHEXOL 300 MG/ML  SOLN COMPARISON:  Abdominal radiograph 10/24/2006 FINDINGS: Lower chest: Bandlike atelectasis in the lingula. Lung bases are otherwise clear. Normal heart size. No pericardial effusion. Coronary arteries are calcified. Hepatobiliary: No focal liver abnormality is seen. No gallstones, gallbladder wall thickening, or biliary dilatation. Pancreas: Unremarkable. No pancreatic ductal dilatation or surrounding inflammatory changes. Spleen: Normal in size without focal abnormality. Adrenals/Urinary Tract: Normal adrenal glands. Scattered subcentimeter hypoattenuating foci in both kidneys too small to fully characterize on CT imaging but statistically likely benign. No worrisome renal lesions. No urolithiasis or hydronephrosis. Normal urinary bladder. Stomach/Bowel: Small fluid-filled type 2 hiatal hernia. Distal stomach is unremarkable. Duodenal sweep takes a normal course. No small bowel dilatation or wall thickening. A normal appendix is visualized. There is a slightly tortuous and redundant colon  particularly the right hemicolon. Portion of the transverse colon protrudes into a large ventral hernia defect without evidence of resulting mechanical obstruction at this time. Distal colon  demonstrates some scattered colonic diverticula without diverticular inflammation. Vascular/Lymphatic: Atherosclerotic plaque within the normal caliber aorta. No acute aortic abnormalities clearly identified within the limitations of this non angiographic technique. No periaortic stranding or hemorrhage. No aneurysm or ectasia. No suspicious or enlarged lymph nodes in the included lymphatic chains. Reproductive: Uterus is surgically absent. No concerning adnexal lesions. Other: Posterior body wall edema. Large ventral hernia defect appears to protrude appears to protrude medial to the rectus sheath accounting for abdominal wall laxity and rightward displacement of the linea alba. Hernia sac contains portion of the transverse colon and omental fat with some stranding within the herniated fat contents. A smaller superimposed fat containing hernia seen just inferiorly as well., fascial defect measures approximately 3.6 by 4.9 cm transverse by craniocaudal. Few benign-appearing calcifications present within the herniated fat content. No free fluid or free air. Musculoskeletal: Ventral hernia, as above. Laxity of the anterior abdominal wall with atrophy of the rectus sheath and oblique musculature. No visible compression deformity or acute osseous injury is identified. The osseous structures appear diffusely demineralized which may limit detection of small or nondisplaced fractures. Transitional lumbosacral anatomy with lumbarization of the L5 vertebrae. IMPRESSION: 1. Aortic Atherosclerosis (ICD10-I70.0). No acute aortic abnormality. 2. The osseous structures appear diffusely demineralized which may limit detection of small or nondisplaced fractures. No visible compression deformity or acute osseous injury is identified. 3. Large  ventral hernia defect appears to protrude medial to the rectus sheath accounting for abdominal wall laxity and rightward displacement of the linea alba. Hernia sac contains portion of the transverse colon and omental fat with some stranding within the herniated fat contents which could reflect some fat strangulation. No evidence of resulting mechanical colonic obstruction at this time. 4. Small fluid-filled type 2 paraesophageal hiatal hernia. 5. Scattered colonic diverticula without diverticular inflammation. 6. Coronary arteries are calcified. These results were called by telephone at the time of interpretation on 12/02/2019 at 10:54 pm to provider [facility] , who verbally acknowledged these results. Electronically Signed   By: Lovena Le M.D.   On: 12/02/2019 22:54    EKG: Independently reviewed.  Assessment/Plan SHAQUASHA KWOK is a 79 y.o. female with medical history significant of HTN, HLD, T2DM, CAD s/p CABG, Obesity/OSA, Depression, GERD, Paroxysmal Atrial Fibrillation, LAA clip, AS s/p AVR, and IDA who presents with back pain.  # Back pain - appears to be muscular, strain and hx suggestive of this, no trauma or fall, unlikely to be fracture.  Has CT which was not revealing of this.   - would continue with conservative management, pain control, muscle relaxer and PT/OT - if refractory to adequate time with the above would obtain MRI (eval tear, other pathology)  # Mixed Non-gap and AGMA - last A1c was 5.8, awaiting repeat, BG not very impressive though with DKA not necessarily needed - may be ketosis in setting of poor intake - continue pain control, fluids, and repeat - if persistent will add on ketones and B hydroxybutyrate, do not suspect her uremia can account for this and Bicarb is 16 - will also add on VBG  # Leukocytosis - suspect stress demargination, hx not suggestive of infection  # CAD s/p CABG # Paroxysmal Atrial Fibrillation s/p LAA ligation # s/p Bioprosthetic AVR -  continue aspirin, carvedilol, rosuvastatin - follows with cardiology  # 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

## 2019-12-03 NOTE — Plan of Care (Signed)
?  Problem: Education: ?Goal: Knowledge of General Education information will improve ?Description: Including pain rating scale, medication(s)/side effects and non-pharmacologic comfort measures ?Outcome: Progressing ?  ?Problem: Health Behavior/Discharge Planning: ?Goal: Ability to manage health-related needs will improve ?Outcome: Progressing ?  ?Problem: Clinical Measurements: ?Goal: Ability to maintain clinical measurements within normal limits will improve ?Outcome: Progressing ?Goal: Will remain free from infection ?Outcome: Progressing ?Goal: Diagnostic test results will improve ?Outcome: Progressing ?Goal: Respiratory complications will improve ?Outcome: Progressing ?Goal: Cardiovascular complication will be avoided ?Outcome: Progressing ?  ?Problem: Nutrition: ?Goal: Adequate nutrition will be maintained ?Outcome: Progressing ?  ?Problem: Coping: ?Goal: Level of anxiety will decrease ?Outcome: Progressing ?  ?Problem: Elimination: ?Goal: Will not experience complications related to bowel motility ?Outcome: Progressing ?Goal: Will not experience complications related to urinary retention ?Outcome: Progressing ?  ?Problem: Safety: ?Goal: Ability to remain free from injury will improve ?Outcome: Progressing ?  ?Problem: Skin Integrity: ?Goal: Risk for impaired skin integrity will decrease ?Outcome: Progressing ?  ?Problem: Activity: ?Goal: Risk for activity intolerance will decrease ?Outcome: Not Progressing ?  ?Problem: Pain Managment: ?Goal: General experience of comfort will improve ?Outcome: Not Progressing ?  ?

## 2019-12-03 NOTE — Progress Notes (Signed)
PROGRESS NOTE    Doris Jefferson  YHC:[id number] DOB: 1941/02/18 DOA: 12/02/2019 PCP: Aletha Halim., PA-C    Brief Narrative:  Doris Jefferson is a 79 year old Caucasian female with past medical history remarkable for essential hypertension, hyperlipidemia, type 2 diabetes mellitus, CAD status post CABG, obesity/OSA, depression, GERD, paroxysmal atrial fibrillation, iron deficiency anemia who presented to the ED with intractable back pain.  She was in her usual state of health until Saturday night when she turned awkwardly in her bed trying to get out and use the bathroom and had sudden onset severe back pain.  She reports it is localized to the low back, described as a bandlike sensation that is sharp.  No recent injuries or falls.  No recent similar events, although stated this occurred with similar symptoms 25 years ago.  Now requiring the assistance of her husband to get a bed and use of a walker, which she previously has not had to use.  She also reports poor oral intake due to lack of appetite in the setting of severe pain.  Denies any nausea, vomiting or diarrhea.   Assessment & Plan:   Active Problems:   Back pain   Intractable pain   Intractable back pain Patient presenting to the ED following recent acute onset low back pain after awkward movement getting out of bed.  CT abdomen/pelvis with no acute musculoskeletal findings.  Suspect etiology likely secondary to muscle strain. --Pain control with oxycodone 5 mg p.o. every 4 hours as needed, Robaxin 500 mg p.o. twice daily --Dilaudid IV as needed for breakthrough pain --will give trial dose of Valium 2 mg p.o. x1 this afternoon --Seen by OT today with recommendations of home health on discharge --Pending PT evaluation --Supportive care --If no significant improvement by tomorrow, will consider MRI L-spine  Leukocytosis Suspect stress to marginalization with a history of acute pain as above.  Afebrile.  No history or localizing  symptoms suggestive of infectious etiology. 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  T2DM On Metformin 1000 mg p.o. twice daily, Jardiance, Victoza outpatient. --Hold home outpatient regimen while hospitalized --ISS and CBG   Essential HTN --continue carvedilol 12.5 mg p.o. twice daily  HLD --continue rosuvastatin  Depression --continue bupropion   DVT prophylaxis: Lovenox Code Status: Full code Family Communication: Discussed with patient's husband who is present at bedside Disposition Plan:      Patient is from: Home with husband     Anticipated Disposition:  Home     Barriers to discharge or conditions that needs to be met prior to discharge: Improvement of back pain  Consultants:   None  Procedures:   None  Antimicrobials:   None   Subjective: Patient seen and examined bedside, resting comfortably.  Husband present at bedside.  Continues to complain of low back pain.  Some improvement with pain medication.  Work with occupational therapy earlier this morning, awaiting PT evaluation.  No other specific complaints at this time.  Denies any focal weakness to her extremities or paresthesias, no headache, no visual changes, no chest pain, no palpitations, no shortness of breath, no abdominal pain, no fatigue.  No acute events overnight per nursing  staff.  Objective: Vitals:   12/03/19 0200 12/03/19 0253 12/03/19 0802 12/03/19 1425  BP: (!) 118/54 (!) 136/114 (!) 146/47 99/79  Pulse: (!) 102 (!) 108 98 76  Resp: _0 Temp:  98.7 F (37.1 C)  98.8 F  (37.1 C)  TempSrc:  Oral  Oral  SpO2: 99% 100%  99%  Weight:      Height:        Intake/Output Summary (Last 24 hours) at 12/03/2019 1445 Last data filed at 12/03/2019 1254 Gross per 24 hour  Intake 1360 ml  Output 800 ml  Net 560 ml   Filed Weights   12/02/19 1655  Weight: 77.1 kg    Examination:  General exam: Appears calm and comfortable  Respiratory system: Clear to auscultation. Respiratory effort normal. Cardiovascular system: S1 & S2 heard, RRR. No JVD, murmurs, rubs, gallops or clicks. No pedal edema. Gastrointestinal system: Abdomen is nondistended, soft and nontender. No organomegaly or masses felt. Normal bowel sounds heard. Central nervous system: Alert and oriented. No focal neurological deficits. Extremities: Symmetric 5 x 5 power. Skin: No rashes, lesions or ulcers Psychiatry: Judgement and insight appear normal. Mood & affect appropriate.     Data Reviewed: I have personally reviewed following labs and imaging studies  CBC: Recent Labs  Lab 12/02/19 2053 12/03/19 0514  WBC 13.1* 13.9*  HGB 10.3* 10.1*  HCT 33.4* 32.3*  MCV 88.1 88.7  PLT 223 782   Basic Metabolic Panel: Recent Labs  Lab 12/02/19 2053 12/03/19 0514  NA 140 142  K 3.8 3.9  CL 106 110  CO2 16* 16*  GLUCOSE 210* 175*  BUN 34* 31*  CREATININE 1.40* 1.29*  CALCIUM 9.1 8.8*   GFR: Estimated Creatinine Clearance: 35.3 mL/min (A) (by C-G formula based on SCr of 1.29 mg/dL (H)). Liver Function Tests: Recent Labs  Lab 12/02/19 2053  AST 21  ALT 16  ALKPHOS 82  BILITOT 1.2  PROT 6.9  ALBUMIN 3.2*   No results for input(s): LIPASE, AMYLASE in the last 168 hours. No results for input(s): AMMONIA in the last 168 hours. Coagulation Profile: No results for input(s): INR, PROTIME in the last 168 hours. Cardiac Enzymes: No results for input(s): CKTOTAL, CKMB, CKMBINDEX, TROPONINI in the last 168 hours. BNP (last 3 results) No results for input(s): PROBNP in the last 8760  hours. HbA1C: Recent Labs    12/03/19 0514  HGBA1C 6.9*   CBG: Recent Labs  Lab 12/02/19 1655 12/03/19 0323 12/03/19 0719 12/03/19 1200  GLUCAP 219* 159* 155* 163*   Lipid Profile: No results for input(s): CHOL, HDL, LDLCALC, TRIG, CHOLHDL, LDLDIRECT in the last 72 hours. Thyroid Function Tests: No results for input(s): TSH, T4TOTAL, FREET4, T3FREE, THYROIDAB in the last 72 hours. Anemia Panel: No results for input(s): VITAMINB12, FOLATE, FERRITIN, TIBC, IRON, RETICCTPCT in the last 72 hours. Sepsis Labs: No results for input(s): PROCALCITON, LATICACIDVEN in the last 168 hours.  Recent Results (from the past 240 hour(s))  SARS CORONAVIRUS 2 (TAT 6-24 HRS) Nasopharyngeal Nasopharyngeal Swab     Status: None   Collection Time: 12/03/19 12:44 AM   Specimen: Nasopharyngeal Swab  Result Value Ref Range Status   SARS Coronavirus 2 NEGATIVE NEGATIVE Final    Comment: (NOTE) SARS-CoV-2 target nucleic acids are NOT DETECTED. The SARS-CoV-2 RNA is generally detectable in upper and lower respiratory specimens during the acute phase of infection. Negative results do not preclude SARS-CoV-2 infection, do not rule out co-infections with other pathogens,  and should not be used as the sole basis for treatment or other patient management decisions. Negative results must be combined with clinical observations, patient history, and epidemiological information. The expected result is Negative. Fact Sheet for Patients: SugarRoll.be Fact Sheet for Healthcare Providers: https://www.woods-mathews.com/ This test is not yet approved or cleared by the [place] FDA and  has been authorized for detection and/or diagnosis of SARS-CoV-2 by FDA under an Emergency Use Authorization (EUA). This EUA will remain  in effect (meaning this test can be used) for the duration of the COVID-19 declaration under Section 56 4(b)(1) of the Act, 21 U.S.C. section  360bbb-3(b)(1), unless the authorization is terminated or revoked sooner. Performed at [facility], [place] [address] [place], [place] [address]          Radiology Studies: DG Chest 2 View  Result Date: 12/02/2019 CLINICAL DATA:  79 year old female with shortness of breath EXAM: CHEST - 2 VIEW COMPARISON:  Chest radiograph dated 12/12/2018. FINDINGS: There is no focal consolidation, pleural effusion, or pneumothorax. Mild diffuse interstitial coarsening. The cardiac silhouette is within normal limits. Median sternotomy wires, mechanical heart valve and left atrial appendage occlusive device. No acute osseous pathology. Cervical ACDF. IMPRESSION: No active cardiopulmonary disease. Electronically Signed   By: Anner Crete M.D.   On: 12/02/2019 18:36   CT ABDOMEN PELVIS W CONTRAST  Result Date: 12/02/2019 CLINICAL DATA:  Abdominal pain, back pain, concern for compression deformity versus acute aortic process EXAM: CT ABDOMEN AND PELVIS WITH CONTRAST TECHNIQUE: Multidetector CT imaging of the abdomen and pelvis was performed using the standard protocol following bolus administration of intravenous contrast. CONTRAST:  73m OMNIPAQUE IOHEXOL 300 MG/ML  SOLN COMPARISON:  Abdominal radiograph 10/24/2006 FINDINGS: Lower chest: Bandlike atelectasis in the lingula. Lung bases are otherwise clear. Normal heart size. No pericardial effusion. Coronary arteries are calcified. Hepatobiliary: No focal liver abnormality is seen. No gallstones, gallbladder wall thickening, or biliary dilatation. Pancreas: Unremarkable. No pancreatic ductal dilatation or surrounding inflammatory changes. Spleen: Normal in size without focal abnormality. Adrenals/Urinary Tract: Normal adrenal glands. Scattered subcentimeter hypoattenuating foci in both kidneys too small to fully characterize on CT imaging but statistically likely benign. No worrisome renal lesions. No urolithiasis or hydronephrosis. Normal urinary bladder.  Stomach/Bowel: Small fluid-filled type 2 hiatal hernia. Distal stomach is unremarkable. Duodenal sweep takes a normal course. No small bowel dilatation or wall thickening. A normal appendix is visualized. There is a slightly tortuous and redundant colon particularly the right hemicolon. Portion of the transverse colon protrudes into a large ventral hernia defect without evidence of resulting mechanical obstruction at this time. Distal colon demonstrates some scattered colonic diverticula without diverticular inflammation. Vascular/Lymphatic: Atherosclerotic plaque within the normal caliber aorta. No acute aortic abnormalities clearly identified within the limitations of this non angiographic technique. No periaortic stranding or hemorrhage. No aneurysm or ectasia. No suspicious or enlarged lymph nodes in the included lymphatic chains. Reproductive: Uterus is surgically absent. No concerning adnexal lesions. Other: Posterior body wall edema. Large ventral hernia defect appears to protrude appears to protrude medial to the rectus sheath accounting for abdominal wall laxity and rightward displacement of the linea alba. Hernia sac contains portion of the transverse colon and omental fat with some stranding within the herniated fat contents. A smaller superimposed fat containing hernia seen just inferiorly as well., fascial defect measures approximately 3.6 by 4.9 cm transverse by craniocaudal. Few benign-appearing calcifications present within the herniated fat content. No free fluid or free air. Musculoskeletal: Ventral hernia,  as above. Laxity of the anterior abdominal wall with atrophy of the rectus sheath and oblique musculature. No visible compression deformity or acute osseous injury is identified. The osseous structures appear diffusely demineralized which may limit detection of small or nondisplaced fractures. Transitional lumbosacral anatomy with lumbarization of the L5 vertebrae. IMPRESSION: 1. Aortic  Atherosclerosis (ICD10-I70.0). No acute aortic abnormality. 2. The osseous structures appear diffusely demineralized which may limit detection of small or nondisplaced fractures. No visible compression deformity or acute osseous injury is identified. 3. Large ventral hernia defect appears to protrude medial to the rectus sheath accounting for abdominal wall laxity and rightward displacement of the linea alba. Hernia sac contains portion of the transverse colon and omental fat with some stranding within the herniated fat contents which could reflect some fat strangulation. No evidence of resulting mechanical colonic obstruction at this time. 4. Small fluid-filled type 2 paraesophageal hiatal hernia. 5. Scattered colonic diverticula without diverticular inflammation. 6. Coronary arteries are calcified. These results were called by telephone at the time of interpretation on 12/02/2019 at 10:54 pm to provider [facility] , who verbally acknowledged these results. Electronically Signed   By: Lovena Le M.D.   On: 12/02/2019 22:54        Scheduled Meds: . aspirin EC  81 mg Oral Daily  . buPROPion  300 mg Oral Daily  . carvedilol  12.5 mg Oral BID WC  . diazepam  2 mg Oral Once  . enoxaparin (LOVENOX) injection  40 mg Subcutaneous Q24H  . insulin aspart  0-5 Units Subcutaneous QHS  . insulin aspart  0-9 Units Subcutaneous TID WC  . loratadine  10 mg Oral Daily  . methocarbamol  1,000 mg Oral Q8H  . rosuvastatin  20 mg Oral Daily  . sildenafil  20 mg Oral TID  . sodium chloride flush  3 mL Intravenous Q12H   Continuous Infusions: . sodium chloride 100 mL/hr at 12/03/19 0250     LOS: 0 days    Time spent: 36 minutes spent on chart review, discussion with nursing staff, consultants, updating family and interview/physical exam; more than 50% of that time was spent in counseling and/or coordination of care.    Elif Yonts J [place] DO [facility] Available via Epic secure chat 7am-7pm After  these hours, please refer to coverage provider listed on [email or web address] 12/03/2019, 2:45 PM

## 2019-12-03 NOTE — Evaluation (Signed)
Occupational Therapy Evaluation Patient Details Name: Doris Jefferson MRN: [id number] DOB: 01/13/1941 Today's Date: 12/03/2019    History of Present Illness Doris Jefferson is a 79 y.o. female with medical history significant of HTN, HLD, T2DM, CAD s/p CABG, Obesity/OSA, Depression, GERD, Paroxysmal Atrial Fibrillation, and IDA who presents with back pain.Patient reports she was in her [place] thru Saturday night when she turned awkwardly in her bed trying to get out to get up and use the bathroom and had sudden onset severe back pain.  She states it is very sharp and she did present to the ER due to the severe pain.  She denies any trauma, she had no falls, no recent injuries prior to going to bed that may have precipitated pain.  She states she once had this occur 25 years ago but has never had pain of this severity since.  She states she was sent home with Ativan and Tramadol but neither have assisted her since.  She states she has been able to occasionally get up out of bed with the assistance of her husband and has now been utilizing her walker which she previously had not.  She states she has not eaten since then due to lack of appetite in setting of severe pain but denies any nausea/vomiting or abdominal pain.   Clinical Impression   Pt admitted with back pain. Pt currently with functional limitations due to the deficits listed below (see OT Problem List).  Pt will benefit from skilled OT to increase their safety and independence with ADL and functional mobility for ADL to facilitate discharge to venue listed below.      Follow Up Recommendations  Home health OT;Supervision/Assistance - 24 hour    Equipment Recommendations  None recommended by OT    Recommendations for Other Services       Precautions / Restrictions Precautions Precautions: Fall Precaution Comments: back pain      Mobility Bed Mobility Overal bed mobility: Needs Assistance Bed Mobility: Rolling;Sidelying to  Sit Rolling: Min assist Sidelying to sit: Max assist          Transfers Overall transfer level: Needs assistance Equipment used: Rolling walker (2 wheeled) Transfers: Sit to/from [facility] Sit to Stand: Mod assist Stand pivot transfers: Mod assist       General transfer comment: pt in significant pain upon sitting but pain decreased with time. Pt agreeable to the chair    Balance Overall balance assessment: Needs assistance Sitting-balance support: Bilateral upper extremity supported Sitting balance-Leahy Scale: Fair     Standing balance support: Bilateral upper extremity supported Standing balance-Leahy Scale: Poor                             ADL either performed or assessed with clinical judgement   ADL Overall ADL's : Needs assistance/impaired Eating/Feeding: Set up;Sitting   Grooming: Minimal assistance;Sitting   Upper Body Bathing: Minimal assistance;Sitting   Lower Body Bathing: Maximal assistance;Sit to/from stand;Cueing for compensatory techniques;Cueing for safety;Cueing for sequencing   Upper Body Dressing : Minimal assistance;Sitting   Lower Body Dressing: Sit to/from stand;Cueing for compensatory techniques;Cueing for safety;Cueing for sequencing;Maximal assistance   Toilet Transfer: Moderate assistance;RW;Stand-pivot;Cueing for safety;Cueing for sequencing   Toileting- Clothing Manipulation and Hygiene: Moderate assistance;Sit to/from stand;Cueing for safety;Cueing for compensatory techniques;Cueing for sequencing         General ADL Comments: pts husband can provide A at home but pt needs to be overall  S     Vision Patient Visual Report: No change from baseline              Pertinent Vitals/Pain Pain Assessment: Faces Pain Score: 3  Pain Descriptors / Indicators: Guarding;Discomfort;Sore Pain Intervention(s): Limited activity within patient's tolerance;Monitored during session;Repositioned;Premedicated before  session     Hand Dominance     Extremity/Trunk Assessment Upper Extremity Assessment Upper Extremity Assessment: Generalized weakness           Communication Communication Communication: No difficulties   Cognition Arousal/Alertness: Awake/alert Behavior During Therapy: WFL for tasks assessed/performed Overall Cognitive Status: Within Functional Limits for tasks assessed                                                Home Living Family/patient expects to be discharged to:: Private residence Living Arrangements: Spouse/significant other Available Help at Discharge: Family Type of Home: House Home Access: Stairs to enter     Home Layout: One level     Bathroom Shower/Tub: Occupational psychologist: Standard     Home Equipment: Environmental consultant - 2 wheels;Bedside commode;Shower seat - built in          Prior Functioning/Environment Level of Independence: Independent                 OT Problem List: Decreased strength;Decreased activity tolerance;Impaired balance (sitting and/or standing);Decreased safety awareness;Decreased knowledge of use of DME or AE      OT Treatment/Interventions: Self-care/ADL training;Patient/family education;Therapeutic exercise;DME and/or AE instruction    OT Goals(Current goals can be found in the care plan section) Acute Rehab OT Goals Patient Stated Goal: less pain OT Goal Formulation: With patient Time For Goal Achievement: 12/09/19 ADL Goals Pt Will Perform Grooming: with supervision;standing Pt Will Perform Lower Body Dressing: sit to/from stand;with supervision;with adaptive equipment Pt Will Transfer to Toilet: with supervision;ambulating;stand pivot transfer;bedside commode Pt Will Perform Toileting - Clothing Manipulation and hygiene: with supervision;sit to/from stand  OT Frequency: Min 2X/week   Barriers to D/C:               AM-PAC OT "6 Clicks" Daily Activity     Outcome Measure Help from  another person eating meals?: A Little Help from another person taking care of personal grooming?: A Little Help from another person toileting, which includes using toliet, bedpan, or urinal?: A Lot Help from another person bathing (including washing, rinsing, drying)?: A Little Help from another person to put on and taking off regular upper body clothing?: A Little Help from another person to put on and taking off regular lower body clothing?: A Lot 6 Click Score: 16   End of Session Equipment Utilized During Treatment: Rolling walker;Gait belt Nurse Communication: Mobility status  Activity Tolerance: Patient limited by pain Patient left: in chair  OT Visit Diagnosis: Unsteadiness on feet (R26.81);Other abnormalities of gait and mobility (R26.89);Repeated falls (R29.6);Muscle weakness (generalized) (M62.81)                Time: 1030-1100 OT Time Calculation (min): 30 min Charges:  OT General Charges $OT Visit: 1 Visit OT Evaluation $OT Eval Moderate Complexity: 1 Mod OT Treatments $Self Care/Home Management : 8-22 mins  Kari Baars, OT Acute Rehabilitation Services [phone] Office- [phone], Edwena Felty D 12/03/2019, 12:28 PM

## 2019-12-03 NOTE — Progress Notes (Signed)
Pt declined use of nocturnal cpap.  Pt was advised that RT is available all night should she change her mind.

## 2019-12-04 ENCOUNTER — Inpatient Hospital Stay (HOSPITAL_COMMUNITY): Payer: Medicare Other

## 2019-12-04 DIAGNOSIS — Z952 Presence of prosthetic heart valve: Secondary | ICD-10-CM

## 2019-12-04 DIAGNOSIS — M4805 Spinal stenosis, thoracolumbar region: Secondary | ICD-10-CM

## 2019-12-04 DIAGNOSIS — E109 Type 1 diabetes mellitus without complications: Secondary | ICD-10-CM

## 2019-12-04 DIAGNOSIS — Z951 Presence of aortocoronary bypass graft: Secondary | ICD-10-CM

## 2019-12-04 DIAGNOSIS — I361 Nonrheumatic tricuspid (valve) insufficiency: Secondary | ICD-10-CM

## 2019-12-04 DIAGNOSIS — G061 Intraspinal abscess and granuloma: Secondary | ICD-10-CM

## 2019-12-04 DIAGNOSIS — M4645 Discitis, unspecified, thoracolumbar region: Secondary | ICD-10-CM

## 2019-12-04 DIAGNOSIS — M4625 Osteomyelitis of vertebra, thoracolumbar region: Principal | ICD-10-CM

## 2019-12-04 DIAGNOSIS — I350 Nonrheumatic aortic (valve) stenosis: Secondary | ICD-10-CM

## 2019-12-04 DIAGNOSIS — I340 Nonrheumatic mitral (valve) insufficiency: Secondary | ICD-10-CM

## 2019-12-04 LAB — CBC
HCT: 30.6 % — ABNORMAL LOW (ref 36.0–46.0)
Hemoglobin: 9.6 g/dL — ABNORMAL LOW (ref 12.0–15.0)
MCH: 27.2 pg (ref 26.0–34.0)
MCHC: 31.4 g/dL (ref 30.0–36.0)
MCV: 86.7 fL (ref 80.0–100.0)
Platelets: 183 10*3/uL (ref 150–400)
RBC: 3.53 MIL/uL — ABNORMAL LOW (ref 3.87–5.11)
RDW: 14.2 % (ref 11.5–15.5)
WBC: 8.8 10*3/uL (ref 4.0–10.5)
nRBC: 0 % (ref 0.0–0.2)

## 2019-12-04 LAB — GLUCOSE, CAPILLARY
Glucose-Capillary: 138 mg/dL — ABNORMAL HIGH (ref 70–99)
Glucose-Capillary: 176 mg/dL — ABNORMAL HIGH (ref 70–99)
Glucose-Capillary: 141 mg/dL — ABNORMAL HIGH (ref 70–99)

## 2019-12-04 LAB — SEDIMENTATION RATE: Sed Rate: 64 mm/hr — ABNORMAL HIGH (ref 0–22)

## 2019-12-04 LAB — BASIC METABOLIC PANEL
Anion gap: 11 (ref 5–15)
BUN: 23 mg/dL (ref 8–23)
CO2: 17 mmol/L — ABNORMAL LOW (ref 22–32)
Calcium: 8.4 mg/dL — ABNORMAL LOW (ref 8.9–10.3)
Chloride: 115 mmol/L — ABNORMAL HIGH (ref 98–111)
Creatinine, Ser: 1.12 mg/dL — ABNORMAL HIGH (ref 0.44–1.00)
GFR calc Af Amer: 54 mL/min — ABNORMAL LOW (ref >60)
GFR calc non Af Amer: 47 mL/min — ABNORMAL LOW (ref >60)
Glucose, Bld: 170 mg/dL — ABNORMAL HIGH (ref 70–99)
Potassium: 3.4 mmol/L — ABNORMAL LOW (ref 3.5–5.1)
Sodium: 143 mmol/L (ref 135–145)

## 2019-12-04 LAB — ECHOCARDIOGRAM COMPLETE
Height: 63 in
Weight: 2719.98 oz

## 2019-12-04 LAB — C-REACTIVE PROTEIN: CRP: 15 mg/dL — ABNORMAL HIGH (ref <1.0)

## 2019-12-04 MED ORDER — SENNOSIDES-DOCUSATE SODIUM 8.6-50 MG PO TABS
2.0000 | ORAL_TABLET | Freq: Two times a day (BID) | ORAL | Status: DC
  Administered 2019-12-04 – 2019-12-08 (×9): 2 via ORAL
  Filled 2019-12-04 (×11): qty 2

## 2019-12-04 MED ORDER — PERFLUTREN LIPID MICROSPHERE
1.0000 mL | INTRAVENOUS | Status: AC | PRN
  Administered 2019-12-04: 2 mL via INTRAVENOUS
  Filled 2019-12-04: qty 10

## 2019-12-04 MED ORDER — HYDROMORPHONE HCL 1 MG/ML IJ SOLN
1.0000 mg | INTRAMUSCULAR | Status: DC | PRN
  Administered 2019-12-06 – 2019-12-07 (×5): 1 mg via INTRAVENOUS
  Filled 2019-12-04 (×5): qty 1

## 2019-12-04 MED ORDER — POTASSIUM CHLORIDE CRYS ER 20 MEQ PO TBCR
40.0000 meq | EXTENDED_RELEASE_TABLET | Freq: Once | ORAL | Status: AC
  Administered 2019-12-04: 40 meq via ORAL
  Filled 2019-12-04: qty 2

## 2019-12-04 NOTE — Progress Notes (Signed)
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    PT Comments    The patient tolerated ambulating x 25' x 2 with RW, tolerated toileting/self care with min guard assist. Patient will check with spouse for 3-in 1  at home, if not, patient will need  One. Patient is progressing this session. Continue PT. Patient expresses that she is improved and would like to go home. Agreeable to HHPT.  Follow Up Recommendations  Home health PT;Supervision/Assistance - 24 hour     Equipment Recommendations  (patiewnt needs to check w/ spouse for availability of 3 in 1, if not one at home, needs a 3 in 1.)    Recommendations for Other Services       Precautions / Restrictions Precautions Precautions: None Precaution Comments: back pain    Mobility  Bed Mobility   Bed Mobility: Rolling;Sidelying to Sit Rolling: Min guard         General bed mobility comments: in recliner  Transfers Overall transfer level: Needs assistance Equipment used: Rolling walker (2 wheeled) Transfers: Sit to/from Stand Sit to Stand: Min guard Stand pivot transfers: Mod assist       General transfer comment: extra time  to push up from recliner and BSC , descends slowly.  Ambulation/Gait Ambulation/Gait assistance: Min guard Gait Distance (Feet): 25 Feet(x 2) Assistive device: Rolling walker (2 wheeled) Gait Pattern/deviations: Step-to pattern;Step-through pattern     General Gait Details: gait is slow, able to maneuvre arounf dorr and  /bsc safely   Stairs             Wheelchair Mobility    Modified Rankin (Stroke Patients Only)       Balance Overall balance assessment: Needs assistance Sitting-balance support: Bilateral upper extremity supported Sitting balance-Leahy Scale: Fair     Standing balance support: Single extremity supported;During functional activity Standing balance-Leahy Scale: Poor Standing balance comment: for [facility]                            Cognition Arousal/Alertness: Awake/alert Behavior During Therapy: WFL for tasks assessed/performed Overall Cognitive Status: Within Functional Limits for tasks assessed                                        Exercises      General Comments        Pertinent Vitals/Pain Pain Score: 6  Pain Location: back with moving Pain Descriptors / Indicators: Guarding;Discomfort;Sore Pain Intervention(s): Premedicated before session;Monitored during session    [place] expects to be discharged to:: Private residence Living Arrangements: Spouse/significant other Available Help at Discharge: Family Type of Home: House Home Access: Stairs to enter Entrance Stairs-Rails: Right;Left Home Layout: One level Home Equipment: Environmental consultant - 2 wheels;Bedside commode;Shower seat - built in Additional Comments: hurricane cane, needs to check on [facility]    Prior Function Level of Independence: Independent          PT Goals (current goals can now be  found in the care plan section) Acute Rehab PT Goals Patient Stated Goal: go home today. PT Goal Formulation: With patient Time For Goal Achievement: 12/18/19 Potential to Achieve Goals: Fair Progress towards PT goals: Progressing toward goals    Frequency    Min 3X/week      PT Plan Current plan remains appropriate    Co-evaluation              AM-PAC PT "6 Clicks" Mobility   Outcome Measure  Help needed turning from your back to your side while in  a flat bed without using bedrails?: A Little Help needed moving from lying on your back to sitting on the side of a flat bed without using bedrails?: A Lot Help needed moving to and from a bed to a chair (including a wheelchair)?: A Little Help needed standing up from a chair using your arms (e.g., wheelchair or bedside chair)?: A Little Help needed to walk in hospital room?: A Little Help needed climbing 3-5 steps with a railing? : A Lot 6 Click Score: 16    End of Session   Activity Tolerance: Patient tolerated treatment well Patient left: in chair;with call bell/phone within reach Nurse Communication: Mobility status PT Visit Diagnosis: Unsteadiness on feet (R26.81);Difficulty in walking, not elsewhere classified (R26.2);Pain     Time: [id number] PT Time Calculation (min) (ACUTE ONLY): 14 min  Charges:  $Gait Training: 8-22 mins                     [place] Pager [phone] Office [phone]    Doris Jefferson 12/04/2019, 9:03 AM

## 2019-12-04 NOTE — Evaluation (Signed)
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  Clinical Impression  The patient reports pain is significant when mobilizing, has had pain meds prior to eval. Patient endoreses desire to go home today, states that she has lots of chairs to sit down in. Patient has been limited in ambulation since admission. Will allow to eat breakfast and return to ambulate in room to assess needs. Pt admitted with above diagnosis.  Pt currently with functional limitations due to the deficits listed below (see PT Problem List). Pt will benefit from skilled PT to increase their independence and safety with mobility to allow discharge to the venue listed below.       Follow Up Recommendations Home health PT;Supervision/Assistance - 24 hour    Equipment Recommendations  None recommended by PT    Recommendations for Other Services       Precautions / Restrictions Precautions Precautions: Fall Precaution Comments: back pain      Mobility  Bed Mobility   Bed Mobility: Rolling;Sidelying to Sit Rolling: Min guard         General bed mobility comments: use of the rail, extra time, cues for back precautions  Transfers Overall transfer level: Needs assistance Equipment used: Rolling walker (2 wheeled) Transfers: Sit to/from [facility] Sit to Stand: Mod assist Stand pivot transfers: Mod assist       General transfer comment: extra time, guarded  for standing, small steps to get to recliner, moves very slowly and  guardedly  Ambulation/Gait             General Gait Details: only a few steps to recliner  Stairs            Wheelchair Mobility    Modified Rankin (Stroke Patients Only)       Balance Overall balance assessment: Needs assistance Sitting-balance support: Bilateral upper extremity supported Sitting balance-Leahy Scale: Fair     Standing balance support: Bilateral upper extremity supported Standing balance-Leahy Scale: Poor Standing balance comment: reliant on support                             Pertinent Vitals/Pain Pain Score: 8  Pain Location: back with moving Pain Descriptors / Indicators: Guarding;Discomfort;Sore Pain Intervention(s): Monitored during session;Premedicated before session    [place] expects to be discharged to:: Private residence Living Arrangements: Spouse/significant other Available Help at Discharge: Family Type of Home: House Home Access: Stairs to enter Entrance Stairs-Rails: Psychiatric nurse of Steps: 3 Home Layout: One level Home Equipment: Environmental consultant - 2 wheels;Bedside commode;Shower seat - built in      Prior Function Level of Independence: Independent               Journalist, newspaper        Extremity/Trunk Assessment   Upper Extremity Assessment Upper Extremity Assessment: Defer to OT evaluation    Lower Extremity Assessment Lower Extremity Assessment: Generalized weakness    Cervical / Trunk Assessment Cervical / Trunk Assessment: (guarded when moving)  Communication   Communication: No difficulties  Cognition Arousal/Alertness:  Awake/alert Behavior During Therapy: WFL for tasks assessed/performed Overall Cognitive Status: Within Functional Limits for tasks assessed                                        General Comments      Exercises     Assessment/Plan    PT Assessment Patient needs continued PT services  PT Problem List Decreased  strength;Decreased mobility;Decreased safety awareness;Decreased knowledge of precautions;Decreased activity tolerance;Decreased balance;Pain       PT Treatment Interventions DME instruction;Therapeutic activities;Gait training;Therapeutic exercise;Patient/family education;Stair training;Functional mobility training    PT Goals (Current goals can be found in the Care Plan section)  Acute Rehab PT Goals Patient Stated Goal: go home today. PT Goal Formulation: With patient Time For Goal Achievement: 12/18/19 Potential to Achieve Goals: Fair    Frequency Min 3X/week   Barriers to discharge        Co-evaluation               AM-PAC PT "6 Clicks" Mobility  Outcome Measure Help needed turning from your back to your side while in a flat bed without using bedrails?: A Little Help needed moving from lying on your back to sitting on the side of a flat bed without using bedrails?: A Lot Help needed moving to and from a bed to a chair (including a wheelchair)?: A Lot Help needed standing up from a chair using your arms (e.g., wheelchair or bedside chair)?: A Lot Help needed to walk in hospital room?: A Lot Help needed climbing 3-5 steps with a railing? : A Lot 6 Click Score: 13    End of Session   Activity Tolerance: Patient limited by pain Patient left: in chair;with call bell/phone within reach;with nursing/sitter in room Nurse Communication: Mobility status PT Visit Diagnosis: Unsteadiness on feet (R26.81);Difficulty in walking, not elsewhere classified (R26.2);Pain    Time: [id number] PT Time Calculation (min) (ACUTE ONLY): 20 min   Charges:   PT Evaluation $PT Eval Low Complexity: [place] [facility] Acute Rehabilitation Services Pager [phone] Office [phone]  Claretha Cooper 12/04/2019, 8:40 AM

## 2019-12-04 NOTE — Consult Note (Signed)
Chief Complaint: Patient was seen in consultation today for image guided aspiration of T12-L1 disc space Chief Complaint  Patient presents with   Shortness of Breath    Referring Physician(s): Austria,E  Supervising Physician: Jacqulynn Cadet  Patient Status: [facility] - Out-pt  History of Present Illness: Doris Jefferson is a 79 y.o. female with past medical history significant for hypertension, hyperlipidemia, diabetes, coronary artery disease with prior CABG, obesity/OSA, depression, GERD, paroxysmal atrial fibrillation, iron deficiency anemia who was admitted to [facility] on 2/23 with persistent intractable back pain.  Patient stated she had acute onset of pain after turning awkwardly in bed this past Saturday night and it has persisted since.  She denies fever, headache, chest pain, dyspnea, cough, abdominal pain, nausea/vomiting, upper or lower extremity paresthesias, or bleeding.  MRI of the lumbar spine performed today revealed:   Transitional lumbosacral anatomy.   Fluid in the disc interspace with endplate edema and infiltration of anterior paraspinous fat at T12-L1 could be due to degenerative change but is most suspicious for discitis and osteomyelitis. Likely ventral epidural phlegmon narrows the ventral thecal sac causing mild to moderate central canal stenosis at T12-L1.  Moderate central canal stenosis L2-3.  Mild central canal stenosis at L4-5 where there is also narrowing in the right subarticular recess which could impact the right L5 root  Current temp 99.5, WBC normal, hemoglobin 9.6, platelets normal, creatinine 1.12, Covid 19 neg, PT/INR pending. Blood cx pend.  Request now received from primary care team for image guided aspiration of T12-L1 disc space for further evaluation.  Past Medical History:  Diagnosis Date   Coronary artery disease    Depression    GERD (gastroesophageal reflux disease)    Heart murmur    HTN (hypertension)      Hyperlipidemia    Iron deficiency anemia    Obesity (BMI 30-39.9) 03/04/2014   OSA on CPAP    severe with AHI 31/hr   PAF (paroxysmal atrial fibrillation) ([place])    Pneumonia    "twice; years ago" (09/24/2018)   Skin cancer    "left neck; burned off"   Type II diabetes mellitus ([place])     Past Surgical History:  Procedure Laterality Date   ABDOMINAL HYSTERECTOMY     ANTERIOR CERVICAL DECOMP/DISCECTOMY FUSION     "cage around my neck; Dr. Erline Levine"   AORTIC VALVE REPLACEMENT N/A 09/27/2018   Procedure: AORTIC VALVE REPLACEMENT (AVR) USING MAGNA EASE SIZE 21 MM;  Surgeon: Grace Isaac, MD;  Location: [place];  Service: Open Heart Surgery;  Laterality: N/A;   BACK SURGERY     CARDIAC CATHETERIZATION  09/24/2018   CARDIOVERSION N/A 12/19/2013   Procedure: CARDIOVERSION;  Surgeon: Sinclair Grooms, MD;  Location: [facility] ENDOSCOPY;  Service: Cardiovascular;  Laterality: N/A;   CARDIOVERSION N/A 08/16/2015   Procedure: CARDIOVERSION;  Surgeon: Larey Dresser, MD;  Location: [place];  Service: Cardiovascular;  Laterality: N/A;   CARDIOVERSION N/A 09/13/2015   Procedure: CARDIOVERSION;  Surgeon: Sueanne Margarita, MD;  Location: [place] ENDOSCOPY;  Service: Cardiovascular;  Laterality: N/A;   CARDIOVERSION N/A 10/07/2018   Procedure: CARDIOVERSION;  Surgeon: Sanda Klein, MD;  Location: [facility] ENDOSCOPY;  Service: Cardiovascular;  Laterality: N/A;   CATARACT EXTRACTION W/ INTRAOCULAR LENS  IMPLANT, BILATERAL Bilateral    CLIPPING OF ATRIAL APPENDAGE N/A 09/27/2018   Procedure: CLIPPING OF ATRIAL APPENDAGE USING ATRICURE FLEXV SIZE 35MM;  Surgeon: Grace Isaac, MD;  Location: [place];  Service: Open  Heart Surgery;  Laterality: N/A;   COLONOSCOPY WITH PROPOFOL N/A 11/13/2017   Procedure: COLONOSCOPY WITH PROPOFOL;  Surgeon: Wilford Corner, MD;  Location: [facility] ENDOSCOPY;  Service: Endoscopy;  Laterality: N/A;  ANTIBIOTIC NEEDED BEFORE PROCEDURE   CORONARY ARTERY BYPASS  GRAFT N/A 09/27/2018   Procedure: CORONARY ARTERY BYPASS GRAFTING (CABG) x three, using left internal mammary artery and right leg greater saphenous vein harvested endoscopically;  Surgeon: Grace Isaac, MD;  Location: [place];  Service: Open Heart Surgery;  Laterality: N/A;   ESOPHAGOGASTRODUODENOSCOPY (EGD) WITH PROPOFOL N/A 11/13/2017   Procedure: ESOPHAGOGASTRODUODENOSCOPY (EGD) WITH PROPOFOL;  Surgeon: Wilford Corner, MD;  Location: [facility] ENDOSCOPY;  Service: Endoscopy;  Laterality: N/A;   EYE SURGERY     RIGHT/LEFT HEART CATH AND CORONARY ANGIOGRAPHY N/A 09/24/2018   Procedure: RIGHT/LEFT HEART CATH AND CORONARY ANGIOGRAPHY;  Surgeon: Jettie Booze, MD;  Location: [place] CV LAB;  Service: Cardiovascular;  Laterality: N/A;   TEE WITHOUT CARDIOVERSION N/A 09/27/2018   Procedure: TRANSESOPHAGEAL ECHOCARDIOGRAM (TEE);  Surgeon: Grace Isaac, MD;  Location: [place];  Service: Open Heart Surgery;  Laterality: N/A;   TUBAL LIGATION      Allergies: Minocin [minocycline hcl], Benazepril, Hydrochlorothiazide, and Tape  Medications: Prior to Admission medications   Medication Sig Start Date End Date Taking? Authorizing Provider  acetaminophen (TYLENOL) 325 MG tablet Take 650 mg by mouth every 6 (six) hours as needed for moderate pain or headache.   Yes [provider]  aspirin EC 81 MG EC tablet Take 1 tablet (81 mg total) by mouth daily. 10/08/18  Yes Barrett, Erin R, PA-C  buPROPion (WELLBUTRIN XL) 300 MG 24 hr tablet Take 300 mg by mouth daily.  01/11/16  Yes [provider]  carvedilol (COREG) 12.5 MG tablet Take 12.5 mg by mouth 2 (two) times daily with a meal.   Yes [provider]  cetirizine (ZYRTEC) 10 MG tablet Take 10 mg by mouth as needed for allergies.   Yes [provider]  empagliflozin (JARDIANCE) 10 MG TABS tablet Take 10 mg by mouth daily before breakfast. 05/22/19  Yes Bensimhon, Shaune Pascal, MD  furosemide (LASIX) 20 MG  tablet Take 1 tablet (20 mg total) by mouth daily. 09/15/19  Yes Belva Crome, MD  liraglutide (VICTOZA) 18 MG/3ML SOPN Inject 1.8 mg into the skin daily.    Yes [provider]  loperamide (IMODIUM) 2 MG capsule Take 1 capsule (2 mg total) by mouth as needed for diarrhea or loose stools. 10/09/18  Yes Conte, Tessa N, PA-C  MAGNESIUM-OXIDE 400 (241.3 Mg) MG tablet TAKE 1 TABLET BY MOUTH TWICE DAILY 07/04/19  Yes Belva Crome, MD  metFORMIN (GLUCOPHAGE) 1000 MG tablet Take 1,000 mg by mouth 2 (two) times a day. 01/01/19  Yes [provider]  Potassium (GNP POTASSIUM) 99 MG TABS Take 99 mg by mouth 3 (three) times daily.    Yes [provider]  rosuvastatin (CRESTOR) 20 MG tablet Take 20 mg by mouth daily.   Yes [provider]  sildenafil (REVATIO) 20 MG tablet Take 1 tablet (20 mg total) by mouth 3 (three) times daily. 10/09/18  Yes Conte, Tessa N, PA-C  carvedilol (COREG) 12.5 MG tablet Take 1 tablet (12.5 mg total) by mouth 2 (two) times daily. 06/04/19 11/29/19  Belva Crome, MD  LORazepam (ATIVAN) 0.5 MG tablet Take 1 tablet (0.5 mg total) by mouth 3 (three) times daily as needed (muscle spasm). Patient not taking: Reported on 12/02/2019 11/29/19  Daleen Bo, MD  traMADol (ULTRAM) 50 MG tablet Take 1 tablet (50 mg total) by mouth every 6 (six) hours as needed for moderate pain. Patient not taking: Reported on 12/02/2019 11/29/19   Daleen Bo, MD     Family History  Problem Relation Age of Onset   Hypertension Father    Heart disease Father    Melanoma Mother    Hypertension Brother     Social History   Socioeconomic History   Marital status: Married    Spouse name: Thomsa   Number of children: Not on file   Years of education: 12   Highest education level: High school graduate  Occupational History   Occupation: Retired  Tobacco Use   Smoking status: Never Smoker   Smokeless tobacco: Never Used  Substance and Sexual Activity    Alcohol use: Never   Drug use: Never   Sexual activity: Not Currently  Other Topics Concern   Not on file  Social History Narrative   Not on file   Social Determinants of Health   Financial Resource Strain: Low Risk    Difficulty of Paying Living Expenses: Not hard at all  Food Insecurity: No Food Insecurity   Worried About Charity fundraiser in the Last Year: Never true   [place] in the Last Year: Never true  Transportation Needs: No Transportation Needs   Lack of Transportation (Medical): No   Lack of Transportation (Non-Medical): No  Physical Activity: Insufficiently Active   Days of Exercise per Week: 3 days   Minutes of Exercise per Session: 10 min  Stress: No Stress Concern Present   Feeling of Stress : Not at all  Social Connections:    Frequency of Communication with Friends and Family: Not on file   Frequency of Social Gatherings with Friends and Family: Not on file   Attends Religious Services: Not on file   Active Member of Clubs or Organizations: Not on file   Attends Archivist Meetings: Not on file   Marital Status: Not on file      Review of Systems see above  Vital Signs: BP (!) 137/56 (BP Location: Left Arm)    Pulse 81    Temp 99.5 F (37.5 C)    Resp 14    Ht 5\' 3"  (1.6 m)    Wt 170 lb (77.1 kg)    LMP  (LMP Unknown)    SpO2 100%    BMI 30.11 kg/m   Physical Exam awake, alert.  Chest clear to auscultation bilaterally.  Heart with regular rate and rhythm, positive murmur.  Abdomen soft, positive bowel sounds, nontender.  No lower extremity edema.  Notable mid to lower back pain   Imaging: DG Chest 2 View  Result Date: 12/02/2019 CLINICAL DATA:  79 year old female with shortness of breath EXAM: CHEST - 2 VIEW COMPARISON:  Chest radiograph dated 12/12/2018. FINDINGS: There is no focal consolidation, pleural effusion, or pneumothorax. Mild diffuse interstitial coarsening. The cardiac silhouette is within normal limits.  Median sternotomy wires, mechanical heart valve and left atrial appendage occlusive device. No acute osseous pathology. Cervical ACDF. IMPRESSION: No active cardiopulmonary disease. Electronically Signed   By: Anner Crete M.D.   On: 12/02/2019 18:36   DG Lumbar Spine 2-3 Views  Result Date: 12/01/2019 CLINICAL DATA:  Low back pain for 2 days.  No known injury. EXAM: LUMBAR SPINE - 2-3 VIEW COMPARISON:  None. FINDINGS: The patient has transitional lumbosacral anatomy with rudimentary ribs  off what is presumed to be the first lumbar segment. No fracture or malalignment. Multilevel marked loss of disc space height appears worst at L1-2, L2-3 and L3-4. Extensive atherosclerosis is noted. IMPRESSION: No acute abnormality. Multilevel degenerative disease. Transitional lumbosacral anatomy. Atherosclerosis. Electronically Signed   By: Inge Rise M.D.   On: 12/01/2019 16:09   MR LUMBAR SPINE WO CONTRAST  Result Date: 12/04/2019 CLINICAL DATA:  Intractable low back pain since a twisting injury the night of 11/29/2019. Initial encounter. EXAM: MRI LUMBAR SPINE WITHOUT CONTRAST TECHNIQUE: Multiplanar, multisequence MR imaging of the lumbar spine was performed. No intravenous contrast was administered. COMPARISON:  CT abdomen and pelvis 12/02/2019 and plain films lumbar spine 12/01/2019 FINDINGS: Segmentation: The patient has transitional lumbosacral anatomy. Numbering scheme on this examination is based on the lowest visible pair of ribs on the prior plain films and CT scan. Based on this numbering scheme, the L5 segment is sacralized and transitional. Alignment:  Maintained. Vertebrae: There is some fluid in the disc interspace at T12-L1 and loss of normal fat signal and paraspinous fat anterior to the disc interspace. Mild edema is present in the endplates. No fracture. Conus medullaris and cauda equina: Conus extends to the L1 level. Conus and cauda equina appear normal. Paraspinal and other soft tissues:  See report of abdomen and pelvis CT scan. Disc levels: T10-11 and T11-12 are imaged in the sagittal plane only. There is mild disc bulging at both levels but no stenosis. T12-L1: There is a broad-based disc bulge or phlegmon effacing the ventral thecal sac. A very thin focus of epidural fluid is seen just superior to the disc interspace on sagittal image 8 of series 7. There is moderate central canal stenosis. Foramina are open. L1-2: Broad-based disc bulge causes mild to moderate central canal stenosis. Foramina are open. L2-3: Bilateral facet degenerative change, ligamentum flavum thickening and a shallow disc bulge. There is mild to moderate central canal stenosis. Foramina are open. L3-4: Facet degenerative disease, mild disc bulge and ligamentum flavum thickening. There is mild central canal stenosis. Foramina are open. L4-5: Central and eccentric to the right broad-based protrusion causes mild central canal stenosis and narrowing in the right subarticular recess. There is some facet degenerative change. Foramina are open. L5-S1: Transitional segment.  Negative. IMPRESSION: Transitional lumbosacral anatomy. Please see numbering scheme above and correlate with plain films if any intervention is planned. Fluid in the disc interspace with endplate edema and infiltration of anterior paraspinous fat at T12-L1 could be due to degenerative change but is most suspicious for discitis and osteomyelitis. Likely ventral epidural phlegmon narrows the ventral thecal sac causing mild to moderate central canal stenosis at T12-L1. Moderate central canal stenosis L2-3. Mild central canal stenosis at L4-5 where there is also narrowing in the right subarticular recess which could impact the right L5 root. Electronically Signed   By: Inge Rise M.D.   On: 12/04/2019 12:06   CT ABDOMEN PELVIS W CONTRAST  Result Date: 12/02/2019 CLINICAL DATA:  Abdominal pain, back pain, concern for compression deformity versus acute aortic  process EXAM: CT ABDOMEN AND PELVIS WITH CONTRAST TECHNIQUE: Multidetector CT imaging of the abdomen and pelvis was performed using the standard protocol following bolus administration of intravenous contrast. CONTRAST:  80mL OMNIPAQUE IOHEXOL 300 MG/ML  SOLN COMPARISON:  Abdominal radiograph 10/24/2006 FINDINGS: Lower chest: Bandlike atelectasis in the lingula. Lung bases are otherwise clear. Normal heart size. No pericardial effusion. Coronary arteries are calcified. Hepatobiliary: No focal liver abnormality is seen. No  gallstones, gallbladder wall thickening, or biliary dilatation. Pancreas: Unremarkable. No pancreatic ductal dilatation or surrounding inflammatory changes. Spleen: Normal in size without focal abnormality. Adrenals/Urinary Tract: Normal adrenal glands. Scattered subcentimeter hypoattenuating foci in both kidneys too small to fully characterize on CT imaging but statistically likely benign. No worrisome renal lesions. No urolithiasis or hydronephrosis. Normal urinary bladder. Stomach/Bowel: Small fluid-filled type 2 hiatal hernia. Distal stomach is unremarkable. Duodenal sweep takes a normal course. No small bowel dilatation or wall thickening. A normal appendix is visualized. There is a slightly tortuous and redundant colon particularly the right hemicolon. Portion of the transverse colon protrudes into a large ventral hernia defect without evidence of resulting mechanical obstruction at this time. Distal colon demonstrates some scattered colonic diverticula without diverticular inflammation. Vascular/Lymphatic: Atherosclerotic plaque within the normal caliber aorta. No acute aortic abnormalities clearly identified within the limitations of this non angiographic technique. No periaortic stranding or hemorrhage. No aneurysm or ectasia. No suspicious or enlarged lymph nodes in the included lymphatic chains. Reproductive: Uterus is surgically absent. No concerning adnexal lesions. Other: Posterior  body wall edema. Large ventral hernia defect appears to protrude appears to protrude medial to the rectus sheath accounting for abdominal wall laxity and rightward displacement of the linea alba. Hernia sac contains portion of the transverse colon and omental fat with some stranding within the herniated fat contents. A smaller superimposed fat containing hernia seen just inferiorly as well., fascial defect measures approximately 3.6 by 4.9 cm transverse by craniocaudal. Few benign-appearing calcifications present within the herniated fat content. No free fluid or free air. Musculoskeletal: Ventral hernia, as above. Laxity of the anterior abdominal wall with atrophy of the rectus sheath and oblique musculature. No visible compression deformity or acute osseous injury is identified. The osseous structures appear diffusely demineralized which may limit detection of small or nondisplaced fractures. Transitional lumbosacral anatomy with lumbarization of the L5 vertebrae. IMPRESSION: 1. Aortic Atherosclerosis (ICD10-I70.0). No acute aortic abnormality. 2. The osseous structures appear diffusely demineralized which may limit detection of small or nondisplaced fractures. No visible compression deformity or acute osseous injury is identified. 3. Large ventral hernia defect appears to protrude medial to the rectus sheath accounting for abdominal wall laxity and rightward displacement of the linea alba. Hernia sac contains portion of the transverse colon and omental fat with some stranding within the herniated fat contents which could reflect some fat strangulation. No evidence of resulting mechanical colonic obstruction at this time. 4. Small fluid-filled type 2 paraesophageal hiatal hernia. 5. Scattered colonic diverticula without diverticular inflammation. 6. Coronary arteries are calcified. These results were called by telephone at the time of interpretation on 12/02/2019 at 10:54 pm to provider [facility] , who verbally  acknowledged these results. Electronically Signed   By: Lovena Le M.D.   On: 12/02/2019 22:54    Labs:  CBC: Recent Labs    11/06/19 0819 12/02/19 2053 12/03/19 0514 12/04/19 0439  WBC 6.9 13.1* 13.9* 8.8  HGB 10.9* 10.3* 10.1* 9.6*  HCT 35.1* 33.4* 32.3* 30.6*  PLT 136* 223 217 183    COAGS: No results for input(s): INR, APTT in the last 8760 hours.  BMP: Recent Labs    12/02/19 2053 12/03/19 0514 12/04/19 0439  NA 140 142 143  K 3.8 3.9 3.4*  CL 106 110 115*  CO2 16* 16* 17*  GLUCOSE 210* 175* 170*  BUN 34* 31* 23  CALCIUM 9.1 8.8* 8.4*  CREATININE 1.40* 1.29* 1.12*  GFRNONAA 36* 40* 47*  GFRAA 42*  46* 54*    LIVER FUNCTION TESTS: Recent Labs    12/02/19 2053  BILITOT 1.2  AST 21  ALT 16  ALKPHOS 82  PROT 6.9  ALBUMIN 3.2*    TUMOR MARKERS: No results for input(s): AFPTM, CEA, CA199, CHROMGRNA in the last 8760 hours.  Assessment and Plan: 79 y.o. female with past medical history significant for hypertension, hyperlipidemia, diabetes, coronary artery disease with prior CABG, obesity/OSA, depression, GERD, paroxysmal atrial fibrillation, iron deficiency anemia who was admitted to [facility] on 2/23 with persistent intractable back pain.  Patient stated she had acute onset of pain after turning awkwardly in bed this past Saturday night and it has persisted since.  She denies fever, headache, chest pain, dyspnea, cough, abdominal pain, nausea/vomiting, upper or lower extremity paresthesias, or bleeding.  MRI of the lumbar spine performed today revealed:   Transitional lumbosacral anatomy.   Fluid in the disc interspace with endplate edema and infiltration of anterior paraspinous fat at T12-L1 could be due to degenerative change but is most suspicious for discitis and osteomyelitis. Likely ventral epidural phlegmon narrows the ventral thecal sac causing mild to moderate central canal stenosis at T12-L1.  Moderate central canal stenosis  L2-3.  Mild central canal stenosis at L4-5 where there is also narrowing in the right subarticular recess which could impact the right L5 root  Current temp 99.5, WBC normal, hemoglobin 9.6, platelets normal, creatinine 1.12, Covid 19 neg, PT/INR pending. Blood cx pend.  Request now received from primary care team for image guided aspiration of T12-L1 disc space for further evaluation.  Imaging studies were reviewed by Dr. Laurence Ferrari.  Details/risks of procedure, including but not limited to, internal bleeding, infection, injury to adjacent structures discussed with patient with her understanding and consent.  Procedure scheduled for 2/26.     Thank you for this interesting consult.  I greatly enjoyed meeting BRESLYN BJERK and look forward to participating in their care.  A copy of this report was sent to the requesting provider on this date.  Electronically Signed: D. Rowe Robert, PA-C 12/04/2019, 2:22 PM   I spent a total of  25 minutes   in face to face in clinical consultation, greater than 50% of which was counseling/coordinating care for image guided T12/L1 disc space aspiration

## 2019-12-04 NOTE — TOC Initial Note (Addendum)
Transition of Care [facility]) - Initial/Assessment Note    Patient Details  Name: Doris Jefferson MRN: [id number] Date of Birth: 27-Jan-1941  Transition [facility]) CM/SW Contact:    Lynnell Catalan, RN Phone Number: 12/04/2019, 10:29 AM  Clinical Narrative:                 This CM spoke with pt at bedside for dc planning. Pt states that she lives with her husband, and he is with her 24hrs a day. She states she has used [facility] in the past for home health services and would like to use them again. [facility] liaison contacted for referral. Pt states she has a RW and BSC at home already.  Expected Discharge Plan: [place] Barriers to Discharge: Continued Medical Work up   Patient Goals and CMS Choice Patient states their goals for this hospitalization and ongoing recovery are:: to get better   Choice offered to / list presented to : Patient  Expected Discharge Plan and Services Expected Discharge Plan: [place]   Discharge Planning Services: CM Consult Post Acute Care Choice: [place] arrangements for the past 2 months: Single Family Home                           HH Arranged: PT, OT, Lane Agency: [place] ([facility]) Date HH Agency Contacted: 12/04/19 Time HH Agency Contacted: 1000 Representative spoke with at [place]: Santiago Glad  Prior Living Arrangements/Services Living arrangements for the past 2 months: Single Family Home Lives with:: Spouse Patient language and need for interpreter reviewed:: Yes Do you feel safe going back to the place where you live?: Yes      Need for Family Participation in Patient Care: Yes (Comment) Care giver support system in place?: Yes (comment)      Activities of Daily Living Home Assistive Devices/Equipment: None ADL Screening (condition at time of admission) Patient's cognitive ability adequate to safely complete daily activities?: Yes Is the patient deaf or have difficulty hearing?:  No Does the patient have difficulty seeing, even when wearing glasses/contacts?: No Does the patient have difficulty concentrating, remembering, or making decisions?: No Patient able to express need for assistance with ADLs?: Yes Does the patient have difficulty dressing or bathing?: No Independently performs ADLs?: Yes (appropriate for developmental age) Does the patient have difficulty walking or climbing stairs?: Yes Weakness of Legs: Both Weakness of Arms/Hands: Both  Permission Sought/Granted Permission sought to share information with : Chartered certified accountant granted to share information with : Yes, Verbal Permission Granted     Permission granted to share info w AGENCY: [facility]        Emotional Assessment Appearance:: Appears stated age     Orientation: : Oriented to Self, Oriented to Place, Oriented to  Time, Oriented to Situation      Admission diagnosis:  Dehydration [E86.0] Back pain [M54.9] Acute bilateral low back pain without sciatica [M54.5] Intractable pain [R52] Patient Active Problem List   Diagnosis Date Noted  . Back pain 12/03/2019  . Intractable pain 12/03/2019  . Coronary artery disease involving coronary bypass graft 10/16/2018  . Diabetes mellitus type 2 in obese ([place])   . Acute blood loss anemia   . PAF (paroxysmal atrial fibrillation) ([place])   . Pulmonary hypertension ([place])   . Depression   . 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 S/P AVR (aortic valve  replacement) 09/27/2018  . Vertigo 02/08/2018  . Iron deficiency anemia, unspecified 11/13/2017  . HTN (hypertension) 09/25/2016  . Esophageal reflux 02/23/2016  . Pure hypercholesterolemia 02/23/2016  . Unspecified cataract 02/23/2016  . Obesity (BMI 30-39.9) 03/04/2014  . OSA (obstructive sleep apnea) 12/31/2013  . Anticoagulation goal of INR 2 to 3 11/17/2013  . Status post aortic valve replacement with tissue valve 11/10/2013  . Pulmonary HTN ([place]) 11/10/2013  .  Diabetes mellitus ([place]) 11/10/2013   PCP:  Aletha Halim., PA-C Pharmacy:   [facility] [id number] - [address], [address] [address] Phone: [phone] Fax: [place], [place] [address] Phone: [phone] Fax: [phone]  [facility] [place], [place] - [address] [place] [address] Phone: [phone] Fax: [place], [place] [address] Phone: [phone] Fax: [phone]     Social Determinants of Health (SDOH) Interventions    Readmission Risk Interventions No flowsheet data found.

## 2019-12-04 NOTE — Progress Notes (Signed)
PROGRESS NOTE    Doris Jefferson  [id number] DOB: 08-25-1941 DOA: 12/02/2019 PCP: Aletha Halim., PA-C    Brief Narrative:  Doris Jefferson is a 79 year old Caucasian female with past medical history remarkable for essential hypertension, hyperlipidemia, type 2 diabetes mellitus, CAD status post CABG, obesity/OSA, depression, GERD, paroxysmal atrial fibrillation, iron deficiency anemia who presented to the ED with intractable back pain.  She was in her usual state of health until Saturday night when she turned awkwardly in her bed trying to get out and use the bathroom and had sudden onset severe back pain.  She reports it is localized to the low back, described as a bandlike sensation that is sharp.  No recent injuries or falls.  No recent similar events, although stated this occurred with similar symptoms 25 years ago.  Now requiring the assistance of her husband to get a bed and use of a walker, which she previously has not had to use.  She also reports poor oral intake due to lack of appetite in the setting of severe pain.  Denies any nausea, vomiting or diarrhea.   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  CT abdomen/pelvis with no acute musculoskeletal findings.  MR L-spine for fluid disc interspace with endplate edema and infiltration of anterior paraspinous fat T12-L1 suspicious for discitis and osteomyelitis.  Also  noted questionable phlegmon versus disc bulge at the T12-L1 level. --Pain control with oxycodone 5 mg p.o. every 4 hours as needed, Robaxin 500 mg p.o. twice daily --Dilaudid 18m IV q3h prn for breakthrough pain --check Echo given history of bioprostatic valve replacement --Check blood cultures x2 --ESR/CRP --Holding antibiotics; pending IR biopsy/aspiration --Discussed with neurosurgery, Dr. SVertell Limber states difficult to discern given lack of contrast but states there is some rind formation and recommends to proceed with IR biopsy/aspiration --ID consulted for assistance with antibiotic coverage and duration --Follow CBC, fever curve daily  Leukocytosis: resolved Suspect stress to marginalization with a history of acute pain as above.  Afebrile.  Resolved with IV fluid hydration. --13.1-->13.9-->8.8 --Monitor CBC daily  CAD s/p CABG Paroxysmal Atrial Fibrillation s/p LAA ligation s/p Bioprosthetic AVR --continue aspirin, carvedilol, rosuvastatin --follows with cardiology  Pulmonary HTN OSA --continue sildenafil, CPAP  CKD stage 3b Baseline creatinine between 1.1-1.5 with GFR between 36-40 --Cr 1.40-->1.29-->1.12 --monitor I/O --avoid nephrotoxic agents and dose accordingly --Repeat renal function in a.m.  T2DM On Metformin 1000 mg p.o. twice daily, Jardiance, Victoza outpatient. --Hold home outpatient regimen while hospitalized --ISS and CBG   Essential HTN --continue carvedilol 12.5 mg p.o. twice daily  HLD --continue rosuvastatin  Depression --continue bupropion   DVT prophylaxis: Lovenox Code Status: Full code Family Communication: Discussed with patient's husband who is present at bedside Disposition Plan:      Patient is from: Home with husband     Anticipated Disposition:  Home     Barriers to discharge or  conditions that needs to be met prior to discharge: Other work-up of suspected T12-L1 osteomyelitis/discitis, pending IR  biopsy/aspiration  Consultants:   Interventional radiology  Infectious disease - Dr. Johnnye Sima  Neurosurgery - Dr. Vertell Limber  Procedures:   None  Antimicrobials:   None   Subjective: Patient seen and examined bedside, resting comfortably.  Husband present at bedside.  Continues to complain of low back pain that is exquisitely tender in the lower T spine and upper L-spine midline spinous processes.  Discussed with patient and husband at bedside findings of MRI L-spine consistent with osteomyelitis versus discitis and need for further work-up.  No other specific complaints at this time.  Denies any focal weakness to her extremities or paresthesias, no headache, no visual changes, no chest pain, no palpitations, no shortness of breath, no abdominal pain, no fatigue.  No acute events overnight per nursing staff.  Objective: Vitals:   12/03/19 1700 12/03/19 2026 12/04/19 0515 12/04/19 1259  BP: (!) 126/45 (!) 134/47 (!) 127/54 (!) 137/56  Pulse:  79 87 81  Resp:  15 16 14   Temp:  99.7 F (37.6 C) 99.1 F (37.3 C) 99.5 F (37.5 C)  TempSrc:  Oral Oral   SpO2:  100% 97% 100%  Weight:      Height:        Intake/Output Summary (Last 24 hours) at 12/04/2019 1347 Last data filed at 12/04/2019 1000 Gross per 24 hour  Intake 1620 ml  Output 2100 ml  Net -480 ml   Filed Weights   12/02/19 1655  Weight: 77.1 kg    Examination:  General exam: Appears calm and comfortable  Respiratory system: Clear to auscultation. Respiratory effort normal. Cardiovascular system: S1 & S2 heard, RRR. No JVD, murmurs, rubs, gallops or clicks. No pedal edema. Gastrointestinal system: Abdomen is nondistended, soft and nontender. No organomegaly or masses felt. Normal bowel sounds heard. Central nervous system: Alert and oriented. No focal neurological deficits. Extremities: Symmetric 5 x 5 power.  Exquisitely tender to palpation midline spinous processes low T-spine/upper L-spine. Skin: No rashes,  lesions or ulcers Psychiatry: Judgement and insight appear normal. Mood & affect appropriate.     Data Reviewed: I have personally reviewed following labs and imaging studies  CBC: Recent Labs  Lab 12/02/19 2053 12/03/19 0514 12/04/19 0439  WBC 13.1* 13.9* 8.8  HGB 10.3* 10.1* 9.6*  HCT 33.4* 32.3* 30.6*  MCV 88.1 88.7 86.7  PLT 223 217 161   Basic Metabolic Panel: Recent Labs  Lab 12/02/19 2053 12/03/19 0514 12/04/19 0439  NA 140 142 143  K 3.8 3.9 3.4*  CL 106 110 115*  CO2 16* 16* 17*  GLUCOSE 210* 175* 170*  BUN 34* 31* 23  CREATININE 1.40* 1.29* 1.12*  CALCIUM 9.1 8.8* 8.4*   GFR: Estimated Creatinine Clearance: 40.7 mL/min (A) (by C-G formula based on SCr of 1.12 mg/dL (H)). Liver Function Tests: Recent Labs  Lab 12/02/19 2053  AST 21  ALT 16  ALKPHOS 82  BILITOT 1.2  PROT 6.9  ALBUMIN 3.2*   No results for input(s): LIPASE, AMYLASE in the last 168 hours. No results for input(s): AMMONIA in the last 168 hours. Coagulation Profile: No results for input(s): INR, PROTIME in the last 168 hours. Cardiac Enzymes: No results for input(s): CKTOTAL, CKMB, CKMBINDEX, TROPONINI in the last 168 hours. BNP (last 3 results) No results for input(s): PROBNP in the last 8760 hours. HbA1C: Recent Labs    12/03/19 0514  HGBA1C 6.9*   CBG:  Recent Labs  Lab 12/03/19 1200 12/03/19 1651 12/03/19 2028 12/04/19 0730 12/04/19 1140  GLUCAP 163* 180* 180* 138* 176*   Lipid Profile: No results for input(s): CHOL, HDL, LDLCALC, TRIG, CHOLHDL, LDLDIRECT in the last 72 hours. Thyroid Function Tests: No results for input(s): TSH, T4TOTAL, FREET4, T3FREE, THYROIDAB in the last 72 hours. Anemia Panel: No results for input(s): VITAMINB12, FOLATE, FERRITIN, TIBC, IRON, RETICCTPCT in the last 72 hours. Sepsis Labs: No results for input(s): PROCALCITON, LATICACIDVEN in the last 168 hours.  Recent Results (from the past 240 hour(s))  SARS CORONAVIRUS 2 (TAT 6-24 HRS)  Nasopharyngeal Nasopharyngeal Swab     Status: None   Collection Time: 12/03/19 12:44 AM   Specimen: Nasopharyngeal Swab  Result Value Ref Range Status   SARS Coronavirus 2 NEGATIVE NEGATIVE Final    Comment: (NOTE) SARS-CoV-2 target nucleic acids are NOT DETECTED. The SARS-CoV-2 RNA is generally detectable in upper and lower respiratory specimens during the acute phase of infection. Negative results do not preclude SARS-CoV-2 infection, do not rule out co-infections with other pathogens, and should not be used as the sole basis for treatment or other patient management decisions. Negative results must be combined with clinical observations, patient history, and epidemiological information. The expected result is Negative. Fact Sheet for Patients: SugarRoll.be Fact Sheet for Healthcare Providers: https://www.woods-mathews.com/ This test is not yet approved or cleared by the [place] FDA and  has been authorized for detection and/or diagnosis of SARS-CoV-2 by FDA under an Emergency Use Authorization (EUA). This EUA will remain  in effect (meaning this test can be used) for the duration of the COVID-19 declaration under Section 56 4(b)(1) of the Act, 21 U.S.C. section 360bbb-3(b)(1), unless the authorization is terminated or revoked sooner. Performed at [facility], [place] [address], [place], [place] [address]          Radiology Studies: DG Chest 2 View  Result Date: 12/02/2019 CLINICAL DATA:  79 year old female with shortness of breath EXAM: CHEST - 2 VIEW COMPARISON:  Chest radiograph dated 12/12/2018. FINDINGS: There is no focal consolidation, pleural effusion, or pneumothorax. Mild diffuse interstitial coarsening. The cardiac silhouette is within normal limits. Median sternotomy wires, mechanical heart valve and left atrial appendage occlusive device. No acute osseous pathology. Cervical ACDF. IMPRESSION: No active  cardiopulmonary disease. Electronically Signed   By: Anner Crete M.D.   On: 12/02/2019 18:36   MR LUMBAR SPINE WO CONTRAST  Result Date: 12/04/2019 CLINICAL DATA:  Intractable low back pain since a twisting injury the night of 11/29/2019. Initial encounter. EXAM: MRI LUMBAR SPINE WITHOUT CONTRAST TECHNIQUE: Multiplanar, multisequence MR imaging of the lumbar spine was performed. No intravenous contrast was administered. COMPARISON:  CT abdomen and pelvis 12/02/2019 and plain films lumbar spine 12/01/2019 FINDINGS: Segmentation: The patient has transitional lumbosacral anatomy. Numbering scheme on this examination is based on the lowest visible pair of ribs on the prior plain films and CT scan. Based on this numbering scheme, the L5 segment is sacralized and transitional. Alignment:  Maintained. Vertebrae: There is some fluid in the disc interspace at T12-L1 and loss of normal fat signal and paraspinous fat anterior to the disc interspace. Mild edema is present in the endplates. No fracture. Conus medullaris and cauda equina: Conus extends to the L1 level. Conus and cauda equina appear normal. Paraspinal and other soft tissues: See report of abdomen and pelvis CT scan. Disc levels: T10-11 and T11-12 are imaged in the sagittal plane only. There is mild disc bulging at  both levels but no stenosis. T12-L1: There is a broad-based disc bulge or phlegmon effacing the ventral thecal sac. A very thin focus of epidural fluid is seen just superior to the disc interspace on sagittal image 8 of series 7. There is moderate central canal stenosis. Foramina are open. L1-2: Broad-based disc bulge causes mild to moderate central canal stenosis. Foramina are open. L2-3: Bilateral facet degenerative change, ligamentum flavum thickening and a shallow disc bulge. There is mild to moderate central canal stenosis. Foramina are open. L3-4: Facet degenerative disease, mild disc bulge and ligamentum flavum thickening. There is mild  central canal stenosis. Foramina are open. L4-5: Central and eccentric to the right broad-based protrusion causes mild central canal stenosis and narrowing in the right subarticular recess. There is some facet degenerative change. Foramina are open. L5-S1: Transitional segment.  Negative. IMPRESSION: Transitional lumbosacral anatomy. Please see numbering scheme above and correlate with plain films if any intervention is planned. Fluid in the disc interspace with endplate edema and infiltration of anterior paraspinous fat at T12-L1 could be due to degenerative change but is most suspicious for discitis and osteomyelitis. Likely ventral epidural phlegmon narrows the ventral thecal sac causing mild to moderate central canal stenosis at T12-L1. Moderate central canal stenosis L2-3. Mild central canal stenosis at L4-5 where there is also narrowing in the right subarticular recess which could impact the right L5 root. Electronically Signed   By: Inge Rise M.D.   On: 12/04/2019 12:06   CT ABDOMEN PELVIS W CONTRAST  Result Date: 12/02/2019 CLINICAL DATA:  Abdominal pain, back pain, concern for compression deformity versus acute aortic process EXAM: CT ABDOMEN AND PELVIS WITH CONTRAST TECHNIQUE: Multidetector CT imaging of the abdomen and pelvis was performed using the standard protocol following bolus administration of intravenous contrast. CONTRAST:  61m OMNIPAQUE IOHEXOL 300 MG/ML  SOLN COMPARISON:  Abdominal radiograph 10/24/2006 FINDINGS: Lower chest: Bandlike atelectasis in the lingula. Lung bases are otherwise clear. Normal heart size. No pericardial effusion. Coronary arteries are calcified. Hepatobiliary: No focal liver abnormality is seen. No gallstones, gallbladder wall thickening, or biliary dilatation. Pancreas: Unremarkable. No pancreatic ductal dilatation or surrounding inflammatory changes. Spleen: Normal in size without focal abnormality. Adrenals/Urinary Tract: Normal adrenal glands. Scattered  subcentimeter hypoattenuating foci in both kidneys too small to fully characterize on CT imaging but statistically likely benign. No worrisome renal lesions. No urolithiasis or hydronephrosis. Normal urinary bladder. Stomach/Bowel: Small fluid-filled type 2 hiatal hernia. Distal stomach is unremarkable. Duodenal sweep takes a normal course. No small bowel dilatation or wall thickening. A normal appendix is visualized. There is a slightly tortuous and redundant colon particularly the right hemicolon. Portion of the transverse colon protrudes into a large ventral hernia defect without evidence of resulting mechanical obstruction at this time. Distal colon demonstrates some scattered colonic diverticula without diverticular inflammation. Vascular/Lymphatic: Atherosclerotic plaque within the normal caliber aorta. No acute aortic abnormalities clearly identified within the limitations of this non angiographic technique. No periaortic stranding or hemorrhage. No aneurysm or ectasia. No suspicious or enlarged lymph nodes in the included lymphatic chains. Reproductive: Uterus is surgically absent. No concerning adnexal lesions. Other: Posterior body wall edema. Large ventral hernia defect appears to protrude appears to protrude medial to the rectus sheath accounting for abdominal wall laxity and rightward displacement of the linea alba. Hernia sac contains portion of the transverse colon and omental fat with some stranding within the herniated fat contents. A smaller superimposed fat containing hernia seen just inferiorly as well., fascial defect measures  approximately 3.6 by 4.9 cm transverse by craniocaudal. Few benign-appearing calcifications present within the herniated fat content. No free fluid or free air. Musculoskeletal: Ventral hernia, as above. Laxity of the anterior abdominal wall with atrophy of the rectus sheath and oblique musculature. No visible compression deformity or acute osseous injury is identified.  The osseous structures appear diffusely demineralized which may limit detection of small or nondisplaced fractures. Transitional lumbosacral anatomy with lumbarization of the L5 vertebrae. IMPRESSION: 1. Aortic Atherosclerosis (ICD10-I70.0). No acute aortic abnormality. 2. The osseous structures appear diffusely demineralized which may limit detection of small or nondisplaced fractures. No visible compression deformity or acute osseous injury is identified. 3. Large ventral hernia defect appears to protrude medial to the rectus sheath accounting for abdominal wall laxity and rightward displacement of the linea alba. Hernia sac contains portion of the transverse colon and omental fat with some stranding within the herniated fat contents which could reflect some fat strangulation. No evidence of resulting mechanical colonic obstruction at this time. 4. Small fluid-filled type 2 paraesophageal hiatal hernia. 5. Scattered colonic diverticula without diverticular inflammation. 6. Coronary arteries are calcified. These results were called by telephone at the time of interpretation on 12/02/2019 at 10:54 pm to provider [facility] , who verbally acknowledged these results. Electronically Signed   By: Lovena Le M.D.   On: 12/02/2019 22:54        Scheduled Meds: . aspirin EC  81 mg Oral Daily  . buPROPion  300 mg Oral Daily  . carvedilol  12.5 mg Oral BID WC  . insulin aspart  0-5 Units Subcutaneous QHS  . insulin aspart  0-9 Units Subcutaneous TID WC  . loratadine  10 mg Oral Daily  . methocarbamol  1,000 mg Oral Q8H  . rosuvastatin  20 mg Oral Daily  . senna-docusate  2 tablet Oral BID  . sildenafil  20 mg Oral TID  . sodium chloride flush  3 mL Intravenous Q12H   Continuous Infusions: . sodium chloride 100 mL/hr at 12/03/19 0250     LOS: 1 day    Time spent: 36 minutes spent on chart review, discussion with nursing staff, consultants, updating family and interview/physical exam; more than 50%  of that time was spent in counseling and/or coordination of care.    Wiatt Mahabir J [place] DO [facility] Available via Epic secure chat 7am-7pm After these hours, please refer to coverage provider listed on [email or web address] 12/04/2019, 1:47 PM

## 2019-12-04 NOTE — Progress Notes (Signed)
  Echocardiogram 2D Echocardiogram has been performed.  Doris Jefferson 12/04/2019, 3:34 PM

## 2019-12-04 NOTE — Consult Note (Signed)
[place] [facility]    Date of Admission:  12/02/2019   Total days of antibiotics: 0               Reason for Consult: T12-L1 osteo, discitis with phlegmon    Referring Provider: [place]   Assessment: T12-L1 discitis-osteo with phlegmon DM1 AVR, CABG 10-2017  Plan: 1. Hold anbx 2. Await IR aspirate 2-26 3. Watch glc 4. Await BCx 5. Consider PIC after starting anbx  Thank you so much for this interesting consult,  Principal Problem:   Intractable pain Active Problems:   Pulmonary HTN (HCC)   Diabetes mellitus (HCC)   OSA (obstructive sleep apnea)   HTN (hypertension)   Esophageal reflux   S/P CABG x 3   S/P AVR (aortic valve replacement)   Diabetes mellitus type 2 in obese (HCC)   PAF (paroxysmal atrial fibrillation) (HCC)   Back pain   . aspirin EC  81 mg Oral Daily  . buPROPion  300 mg Oral Daily  . carvedilol  12.5 mg Oral BID WC  . insulin aspart  0-5 Units Subcutaneous QHS  . insulin aspart  0-9 Units Subcutaneous TID WC  . loratadine  10 mg Oral Daily  . methocarbamol  1,000 mg Oral Q8H  . rosuvastatin  20 mg Oral Daily  . senna-docusate  2 tablet Oral BID  . sildenafil  20 mg Oral TID  . sodium chloride flush  3 mL Intravenous Q12H    HPI: Doris Jefferson is a 79 y.o. female with x of diabetes since 1940s per pt, CABG with  10-2017, comes to ED 2-24 with previous intermittent back pain (cramping) that has become continuous and worsening over the last week.  She denies fever or chills.  She denies any recent wounds, injuries or procedures.  In ED she was afebrile and had normal WBC.  She had MRI which showed: Fluid in the disc interspace with endplate edema and infiltration of anterior paraspinous fat at T12-L1 could be due to degenerativechange but is most suspicious for discitis and osteomyelitis. Likely ventral epidural phlegmon narrows the ventral thecal sac causing mild to moderate central canal stenosis at  T12-L1.   Review of Systems: Review of Systems  Constitutional: Negative for chills and fever.  Eyes: Negative for blurred vision.  Gastrointestinal: Negative for constipation and diarrhea.  Genitourinary: Negative for dysuria.  Neurological: Negative for sensory change.  Please see HPI. All other systems reviewed and negative.   Past Medical History:  Diagnosis Date  . Coronary artery disease   . Depression   . GERD (gastroesophageal reflux disease)   . Heart murmur   . HTN (hypertension)   . Hyperlipidemia   . Iron deficiency anemia   . Obesity (BMI 30-39.9) 03/04/2014  . OSA on CPAP    severe with AHI 31/hr  . PAF (paroxysmal atrial fibrillation) ([facility])   . Pneumonia    "twice; years ago" (09/24/2018)  . Skin cancer    "left neck; burned off"  . Type II diabetes mellitus (HCC)     Social History   Tobacco Use  . Smoking status: Never Smoker  . Smokeless tobacco: Never Used  Substance Use Topics  . Alcohol use: Never  . Drug use: Never    Family History  Problem Relation Age of Onset  . Hypertension Father   . Heart disease Father   . Melanoma Mother   . Hypertension Brother  Medications:  Scheduled: . aspirin EC  81 mg Oral Daily  . buPROPion  300 mg Oral Daily  . carvedilol  12.5 mg Oral BID WC  . insulin aspart  0-5 Units Subcutaneous QHS  . insulin aspart  0-9 Units Subcutaneous TID WC  . loratadine  10 mg Oral Daily  . methocarbamol  1,000 mg Oral Q8H  . rosuvastatin  20 mg Oral Daily  . senna-docusate  2 tablet Oral BID  . sildenafil  20 mg Oral TID  . sodium chloride flush  3 mL Intravenous Q12H    Abtx:  Anti-infectives (From admission, onward)   None        OBJECTIVE: Blood pressure (!) 137/56, pulse 81, temperature 99.5 F (37.5 C), resp. rate 14, height 5\' 3"  (1.6 m), weight 77.1 kg, SpO2 100 %.  Physical Exam Constitutional:      General: She is not in acute distress.    Appearance: She is well-developed. She is obese.  She is not toxic-appearing.  HENT:     Mouth/Throat:     Mouth: Mucous membranes are moist.     Pharynx: No oropharyngeal exudate.  Eyes:     Extraocular Movements: Extraocular movements intact.     Pupils: Pupils are equal, round, and reactive to light.  Cardiovascular:     Rate and Rhythm: Normal rate and regular rhythm.  Pulmonary:     Effort: Pulmonary effort is normal.     Breath sounds: Normal breath sounds.  Abdominal:     General: Bowel sounds are normal. There is no distension.     Palpations: Abdomen is soft.     Tenderness: There is no abdominal tenderness.  Musculoskeletal:     Cervical back: Normal range of motion and neck supple.     Right lower leg: No edema.     Left lower leg: No edema.  Feet:     Right foot:     Skin integrity: No ulcer, blister or skin breakdown.     Left foot:     Skin integrity: No ulcer, blister or skin breakdown.  Neurological:     General: No focal deficit present.     Mental Status: She is alert.  Psychiatric:        Mood and Affect: Mood normal.     Lab Results Results for orders placed or performed during the hospital encounter of 12/02/19 (from the past 48 hour(s))  CBG monitoring, ED     Status: Abnormal   Collection Time: 12/02/19  4:55 PM  Result Value Ref Range   Glucose-Capillary 219 (H) 70 - 99 mg/dL    Comment: Glucose reference range applies only to samples taken after fasting for at least 8 hours.   Comment 1 Notify RN   CBC     Status: Abnormal   Collection Time: 12/02/19  8:53 PM  Result Value Ref Range   WBC 13.1 (H) 4.0 - 10.5 K/uL   RBC 3.79 (L) 3.87 - 5.11 MIL/uL   Hemoglobin 10.3 (L) 12.0 - 15.0 g/dL   HCT 33.4 (L) 36.0 - 46.0 %   MCV 88.1 80.0 - 100.0 fL   MCH 27.2 26.0 - 34.0 pg   MCHC 30.8 30.0 - 36.0 g/dL   RDW 13.9 11.5 - 15.5 %   Platelets 223 150 - 400 K/uL   nRBC 0.0 0.0 - 0.2 %    Comment: Performed at [facility], [place] [address], [place], [place] [address]  Comprehensive  metabolic  panel     Status: Abnormal   Collection Time: 12/02/19  8:53 PM  Result Value Ref Range   Sodium 140 135 - 145 mmol/L   Potassium 3.8 3.5 - 5.1 mmol/L   Chloride 106 98 - 111 mmol/L   CO2 16 (L) 22 - 32 mmol/L   Glucose, Bld 210 (H) 70 - 99 mg/dL    Comment: Glucose reference range applies only to samples taken after fasting for at least 8 hours.   BUN 34 (H) 8 - 23 mg/dL   Creatinine, Ser 1.40 (H) 0.44 - 1.00 mg/dL   Calcium 9.1 8.9 - 10.3 mg/dL   Total Protein 6.9 6.5 - 8.1 g/dL   Albumin 3.2 (L) 3.5 - 5.0 g/dL   AST 21 15 - 41 U/L   ALT 16 0 - 44 U/L   Alkaline Phosphatase 82 38 - 126 U/L   Total Bilirubin 1.2 0.3 - 1.2 mg/dL   GFR calc non Af Amer 36 (L) >60 mL/min   GFR calc Af Amer 42 (L) >60 mL/min   Anion gap 18 (H) 5 - 15    Comment: Performed at [facility], [place] [address] [place], [place] [address]  SARS CORONAVIRUS 2 (TAT 6-24 HRS) Nasopharyngeal Nasopharyngeal Swab     Status: None   Collection Time: 12/03/19 12:44 AM   Specimen: Nasopharyngeal Swab  Result Value Ref Range   SARS Coronavirus 2 NEGATIVE NEGATIVE    Comment: (NOTE) SARS-CoV-2 target nucleic acids are NOT DETECTED. The SARS-CoV-2 RNA is generally detectable in upper and lower respiratory specimens during the acute phase of infection. Negative results do not preclude SARS-CoV-2 infection, do not rule out co-infections with other pathogens, and should not be used as the sole basis for treatment or other patient management decisions. Negative results must be combined with clinical observations, patient history, and epidemiological information. The expected result is Negative. Fact Sheet for Patients: SugarRoll.be Fact Sheet for Healthcare Providers: https://www.woods-mathews.com/ This test is not yet approved or cleared by the [place] FDA and  has been authorized for detection and/or diagnosis of SARS-CoV-2 by FDA under an  Emergency Use Authorization (EUA). This EUA will remain  in effect (meaning this test can be used) for the duration of the COVID-19 declaration under Section 56 4(b)(1) of the Act, 21 U.S.C. section 360bbb-3(b)(1), unless the authorization is terminated or revoked sooner. Performed at [facility], [place] [address], [place], [place] [address]   Glucose, capillary     Status: Abnormal   Collection Time: 12/03/19  3:23 AM  Result Value Ref Range   Glucose-Capillary 159 (H) 70 - 99 mg/dL    Comment: Glucose reference range applies only to samples taken after fasting for at least 8 hours.  Basic metabolic panel     Status: Abnormal   Collection Time: 12/03/19  5:14 AM  Result Value Ref Range   Sodium 142 135 - 145 mmol/L   Potassium 3.9 3.5 - 5.1 mmol/L   Chloride 110 98 - 111 mmol/L   CO2 16 (L) 22 - 32 mmol/L   Glucose, Bld 175 (H) 70 - 99 mg/dL    Comment: Glucose reference range applies only to samples taken after fasting for at least 8 hours.   BUN 31 (H) 8 - 23 mg/dL   Creatinine, Ser 1.29 (H) 0.44 - 1.00 mg/dL   Calcium 8.8 (L) 8.9 - 10.3 mg/dL   GFR calc non Af Amer 40 (L) >60 mL/min  GFR calc Af Amer 46 (L) >60 mL/min   Anion gap 16 (H) 5 - 15    Comment: Performed at [facility], [place] [address] [place], [place] [address]  CBC     Status: Abnormal   Collection Time: 12/03/19  5:14 AM  Result Value Ref Range   WBC 13.9 (H) 4.0 - 10.5 K/uL   RBC 3.64 (L) 3.87 - 5.11 MIL/uL   Hemoglobin 10.1 (L) 12.0 - 15.0 g/dL   HCT 32.3 (L) 36.0 - 46.0 %   MCV 88.7 80.0 - 100.0 fL   MCH 27.7 26.0 - 34.0 pg   MCHC 31.3 30.0 - 36.0 g/dL   RDW 14.2 11.5 - 15.5 %   Platelets 217 150 - 400 K/uL   nRBC 0.0 0.0 - 0.2 %    Comment: Performed at [facility], [place] [address], [place], [place] [address]  Hemoglobin A1c     Status: Abnormal   Collection Time: 12/03/19  5:14 AM  Result Value Ref Range   Hgb A1c MFr Bld 6.9 (H) 4.8 - 5.6 %    Comment:  (NOTE) Pre diabetes:          5.7%-6.4% Diabetes:              >6.4% Glycemic control for   <7.0% adults with diabetes    Mean Plasma Glucose 151.33 mg/dL    Comment: Performed at [place] [address], [place], [place] [address]  Blood gas, venous     Status: Abnormal   Collection Time: 12/03/19  7:00 AM  Result Value Ref Range   FIO2 21.00    pH, Ven 7.327 7.250 - 7.430   pCO2, Ven 37.2 (L) 44.0 - 60.0 mmHg   pO2, Ven <31.0 (LL) 32.0 - 45.0 mmHg    Comment: CRITICAL RESULT CALLED TO, READ BACK BY AND VERIFIED WITH: WREEN, S. @ 0723 12/03/2019 PERRY, J.    Bicarbonate 18.9 (L) 20.0 - 28.0 mmol/L   Acid-base deficit 5.9 (H) 0.0 - 2.0 mmol/L   O2 Saturation 31.8 %   Patient temperature 37.0     Comment: Performed at [facility] [address] [place], [place] [address]  Beta-hydroxybutyric acid     Status: Abnormal   Collection Time: 12/03/19  7:00 AM  Result Value Ref Range   Beta-Hydroxybutyric Acid 5.26 (H) 0.05 - 0.27 mmol/L    Comment: RESULTS CONFIRMED BY MANUAL DILUTION Performed at [facility] [address], [place], [place] [address]   Glucose, capillary     Status: Abnormal   Collection Time: 12/03/19  7:19 AM  Result Value Ref Range   Glucose-Capillary 155 (H) 70 - 99 mg/dL    Comment: Glucose reference range applies only to samples taken after fasting for at least 8 hours.   Comment 1 Notify RN    Comment 2 Document in Chart   Glucose, capillary     Status: Abnormal   Collection Time: 12/03/19 12:00 PM  Result Value Ref Range   Glucose-Capillary 163 (H) 70 - 99 mg/dL    Comment: Glucose reference range applies only to samples taken after fasting for at least 8 hours.   Comment 1 Notify RN    Comment 2 Document in Chart   Glucose, capillary     Status: Abnormal   Collection Time: 12/03/19  4:51 PM  Result Value Ref Range   Glucose-Capillary 180 (H) 70 - 99 mg/dL  Comment: Glucose reference range  applies only to samples taken after fasting for at least 8 hours.   Comment 1 Notify RN    Comment 2 Document in Chart   Glucose, capillary     Status: Abnormal   Collection Time: 12/03/19  8:28 PM  Result Value Ref Range   Glucose-Capillary 180 (H) 70 - 99 mg/dL    Comment: Glucose reference range applies only to samples taken after fasting for at least 8 hours.  CBC     Status: Abnormal   Collection Time: 12/04/19  4:39 AM  Result Value Ref Range   WBC 8.8 4.0 - 10.5 K/uL   RBC 3.53 (L) 3.87 - 5.11 MIL/uL   Hemoglobin 9.6 (L) 12.0 - 15.0 g/dL   HCT 30.6 (L) 36.0 - 46.0 %   MCV 86.7 80.0 - 100.0 fL   MCH 27.2 26.0 - 34.0 pg   MCHC 31.4 30.0 - 36.0 g/dL   RDW 14.2 11.5 - 15.5 %   Platelets 183 150 - 400 K/uL   nRBC 0.0 0.0 - 0.2 %    Comment: Performed at [facility], [place] [address], [place], [place] [id number]  Basic metabolic panel     Status: Abnormal   Collection Time: 12/04/19  4:39 AM  Result Value Ref Range   Sodium 143 135 - 145 mmol/L   Potassium 3.4 (L) 3.5 - 5.1 mmol/L   Chloride 115 (H) 98 - 111 mmol/L   CO2 17 (L) 22 - 32 mmol/L   Glucose, Bld 170 (H) 70 - 99 mg/dL    Comment: Glucose reference range applies only to samples taken after fasting for at least 8 hours.   BUN 23 8 - 23 mg/dL   Creatinine, Ser 1.12 (H) 0.44 - 1.00 mg/dL   Calcium 8.4 (L) 8.9 - 10.3 mg/dL   GFR calc non Af Amer 47 (L) >60 mL/min   GFR calc Af Amer 54 (L) >60 mL/min   Anion gap 11 5 - 15    Comment: Performed at [facility], [place] [address] [place], [place] [address]  Glucose, capillary     Status: Abnormal   Collection Time: 12/04/19  7:30 AM  Result Value Ref Range   Glucose-Capillary 138 (H) 70 - 99 mg/dL    Comment: Glucose reference range applies only to samples taken after fasting for at least 8 hours.  Glucose, capillary     Status: Abnormal   Collection Time: 12/04/19 11:40 AM  Result Value Ref Range   Glucose-Capillary 176 (H) 70 - 99  mg/dL    Comment: Glucose reference range applies only to samples taken after fasting for at least 8 hours.   No results found for: Glenna Durand DG Chest 2 View  Result Date: 12/02/2019 CLINICAL DATA:  79 year old female with shortness of breath EXAM: CHEST - 2 VIEW COMPARISON:  Chest radiograph dated 12/12/2018. FINDINGS: There is no focal consolidation, pleural effusion, or pneumothorax. Mild diffuse interstitial coarsening. The cardiac silhouette is within normal limits. Median sternotomy wires, mechanical heart valve and left atrial appendage occlusive device. No acute osseous pathology. Cervical ACDF. IMPRESSION: No active cardiopulmonary disease. Electronically Signed   By: Anner Crete M.D.   On: 12/02/2019 18:36   MR LUMBAR SPINE WO CONTRAST  Result Date: 12/04/2019 CLINICAL DATA:  Intractable low back pain since a twisting injury the night of 11/29/2019. Initial encounter. EXAM: MRI LUMBAR SPINE WITHOUT CONTRAST TECHNIQUE: Multiplanar, multisequence MR imaging of the  lumbar spine was performed. No intravenous contrast was administered. COMPARISON:  CT abdomen and pelvis 12/02/2019 and plain films lumbar spine 12/01/2019 FINDINGS: Segmentation: The patient has transitional lumbosacral anatomy. Numbering scheme on this examination is based on the lowest visible pair of ribs on the prior plain films and CT scan. Based on this numbering scheme, the L5 segment is sacralized and transitional. Alignment:  Maintained. Vertebrae: There is some fluid in the disc interspace at T12-L1 and loss of normal fat signal and paraspinous fat anterior to the disc interspace. Mild edema is present in the endplates. No fracture. Conus medullaris and cauda equina: Conus extends to the L1 level. Conus and cauda equina appear normal. Paraspinal and other soft tissues: See report of abdomen and pelvis CT scan. Disc levels: T10-11 and T11-12 are imaged in the sagittal plane only. There is mild disc  bulging at both levels but no stenosis. T12-L1: There is a broad-based disc bulge or phlegmon effacing the ventral thecal sac. A very thin focus of epidural fluid is seen just superior to the disc interspace on sagittal image 8 of series 7. There is moderate central canal stenosis. Foramina are open. L1-2: Broad-based disc bulge causes mild to moderate central canal stenosis. Foramina are open. L2-3: Bilateral facet degenerative change, ligamentum flavum thickening and a shallow disc bulge. There is mild to moderate central canal stenosis. Foramina are open. L3-4: Facet degenerative disease, mild disc bulge and ligamentum flavum thickening. There is mild central canal stenosis. Foramina are open. L4-5: Central and eccentric to the right broad-based protrusion causes mild central canal stenosis and narrowing in the right subarticular recess. There is some facet degenerative change. Foramina are open. L5-S1: Transitional segment.  Negative. IMPRESSION: Transitional lumbosacral anatomy. Please see numbering scheme above and correlate with plain films if any intervention is planned. Fluid in the disc interspace with endplate edema and infiltration of anterior paraspinous fat at T12-L1 could be due to degenerative change but is most suspicious for discitis and osteomyelitis. Likely ventral epidural phlegmon narrows the ventral thecal sac causing mild to moderate central canal stenosis at T12-L1. Moderate central canal stenosis L2-3. Mild central canal stenosis at L4-5 where there is also narrowing in the right subarticular recess which could impact the right L5 root. Electronically Signed   By: Inge Rise M.D.   On: 12/04/2019 12:06   CT ABDOMEN PELVIS W CONTRAST  Result Date: 12/02/2019 CLINICAL DATA:  Abdominal pain, back pain, concern for compression deformity versus acute aortic process EXAM: CT ABDOMEN AND PELVIS WITH CONTRAST TECHNIQUE: Multidetector CT imaging of the abdomen and pelvis was performed  using the standard protocol following bolus administration of intravenous contrast. CONTRAST:  64mL OMNIPAQUE IOHEXOL 300 MG/ML  SOLN COMPARISON:  Abdominal radiograph 10/24/2006 FINDINGS: Lower chest: Bandlike atelectasis in the lingula. Lung bases are otherwise clear. Normal heart size. No pericardial effusion. Coronary arteries are calcified. Hepatobiliary: No focal liver abnormality is seen. No gallstones, gallbladder wall thickening, or biliary dilatation. Pancreas: Unremarkable. No pancreatic ductal dilatation or surrounding inflammatory changes. Spleen: Normal in size without focal abnormality. Adrenals/Urinary Tract: Normal adrenal glands. Scattered subcentimeter hypoattenuating foci in both kidneys too small to fully characterize on CT imaging but statistically likely benign. No worrisome renal lesions. No urolithiasis or hydronephrosis. Normal urinary bladder. Stomach/Bowel: Small fluid-filled type 2 hiatal hernia. Distal stomach is unremarkable. Duodenal sweep takes a normal course. No small bowel dilatation or wall thickening. A normal appendix is visualized. There is a slightly tortuous and redundant colon particularly the  right hemicolon. Portion of the transverse colon protrudes into a large ventral hernia defect without evidence of resulting mechanical obstruction at this time. Distal colon demonstrates some scattered colonic diverticula without diverticular inflammation. Vascular/Lymphatic: Atherosclerotic plaque within the normal caliber aorta. No acute aortic abnormalities clearly identified within the limitations of this non angiographic technique. No periaortic stranding or hemorrhage. No aneurysm or ectasia. No suspicious or enlarged lymph nodes in the included lymphatic chains. Reproductive: Uterus is surgically absent. No concerning adnexal lesions. Other: Posterior body wall edema. Large ventral hernia defect appears to protrude appears to protrude medial to the rectus sheath accounting for  abdominal wall laxity and rightward displacement of the linea alba. Hernia sac contains portion of the transverse colon and omental fat with some stranding within the herniated fat contents. A smaller superimposed fat containing hernia seen just inferiorly as well., fascial defect measures approximately 3.6 by 4.9 cm transverse by craniocaudal. Few benign-appearing calcifications present within the herniated fat content. No free fluid or free air. Musculoskeletal: Ventral hernia, as above. Laxity of the anterior abdominal wall with atrophy of the rectus sheath and oblique musculature. No visible compression deformity or acute osseous injury is identified. The osseous structures appear diffusely demineralized which may limit detection of small or nondisplaced fractures. Transitional lumbosacral anatomy with lumbarization of the L5 vertebrae. IMPRESSION: 1. Aortic Atherosclerosis (ICD10-I70.0). No acute aortic abnormality. 2. The osseous structures appear diffusely demineralized which may limit detection of small or nondisplaced fractures. No visible compression deformity or acute osseous injury is identified. 3. Large ventral hernia defect appears to protrude medial to the rectus sheath accounting for abdominal wall laxity and rightward displacement of the linea alba. Hernia sac contains portion of the transverse colon and omental fat with some stranding within the herniated fat contents which could reflect some fat strangulation. No evidence of resulting mechanical colonic obstruction at this time. 4. Small fluid-filled type 2 paraesophageal hiatal hernia. 5. Scattered colonic diverticula without diverticular inflammation. 6. Coronary arteries are calcified. These results were called by telephone at the time of interpretation on 12/02/2019 at 10:54 pm to provider [facility] , who verbally acknowledged these results. Electronically Signed   By: Lovena Le M.D.   On: 12/02/2019 22:54   Recent Results (from the  past 240 hour(s))  SARS CORONAVIRUS 2 (TAT 6-24 HRS) Nasopharyngeal Nasopharyngeal Swab     Status: None   Collection Time: 12/03/19 12:44 AM   Specimen: Nasopharyngeal Swab  Result Value Ref Range Status   SARS Coronavirus 2 NEGATIVE NEGATIVE Final    Comment: (NOTE) SARS-CoV-2 target nucleic acids are NOT DETECTED. The SARS-CoV-2 RNA is generally detectable in upper and lower respiratory specimens during the acute phase of infection. Negative results do not preclude SARS-CoV-2 infection, do not rule out co-infections with other pathogens, and should not be used as the sole basis for treatment or other patient management decisions. Negative results must be combined with clinical observations, patient history, and epidemiological information. The expected result is Negative. Fact Sheet for Patients: SugarRoll.be Fact Sheet for Healthcare Providers: https://www.woods-mathews.com/ This test is not yet approved or cleared by the [place] FDA and  has been authorized for detection and/or diagnosis of SARS-CoV-2 by FDA under an Emergency Use Authorization (EUA). This EUA will remain  in effect (meaning this test can be used) for the duration of the COVID-19 declaration under Section 56 4(b)(1) of the Act, 21 U.S.C. section 360bbb-3(b)(1), unless the authorization is terminated or revoked sooner. Performed at [facility], [address], [place], [place] [address]     Microbiology: Recent Results (from the past 240 hour(s))  SARS CORONAVIRUS 2 (TAT 6-24 HRS) Nasopharyngeal Nasopharyngeal Swab     Status: None   Collection Time: 12/03/19 12:44 AM   Specimen: Nasopharyngeal Swab  Result Value Ref Range Status   SARS Coronavirus 2 NEGATIVE NEGATIVE Final    Comment: (NOTE) SARS-CoV-2 target nucleic acids are NOT DETECTED. The SARS-CoV-2 RNA is generally detectable in upper and lower respiratory specimens during the acute phase of  infection. Negative results do not preclude SARS-CoV-2 infection, do not rule out co-infections with other pathogens, and should not be used as the sole basis for treatment or other patient management decisions. Negative results must be combined with clinical observations, patient history, and epidemiological information. The expected result is Negative. Fact Sheet for Patients: SugarRoll.be Fact Sheet for Healthcare Providers: https://www.woods-mathews.com/ This test is not yet approved or cleared by the [place] FDA and  has been authorized for detection and/or diagnosis of SARS-CoV-2 by FDA under an Emergency Use Authorization (EUA). This EUA will remain  in effect (meaning this test can be used) for the duration of the COVID-19 declaration under Section 56 4(b)(1) of the Act, 21 U.S.C. section 360bbb-3(b)(1), unless the authorization is terminated or revoked sooner. Performed at [facility], [place] [address], [place], [place] [address]     Radiographs and labs were personally reviewed by me.   Bobby Rumpf, MD [facility] [phone] 12/04/2019, 2:46 PM

## 2019-12-04 NOTE — Progress Notes (Signed)
Pt refused CPAP qhs.  Pt encouraged to contact RT should she change her mind.  

## 2019-12-05 ENCOUNTER — Inpatient Hospital Stay (HOSPITAL_COMMUNITY): Payer: Medicare Other

## 2019-12-05 DIAGNOSIS — E119 Type 2 diabetes mellitus without complications: Secondary | ICD-10-CM

## 2019-12-05 HISTORY — PX: IR FLUORO GUIDED NEEDLE PLC ASPIRATION/INJECTION LOC: IMG2395

## 2019-12-05 LAB — GLUCOSE, CAPILLARY
Glucose-Capillary: 136 mg/dL — ABNORMAL HIGH (ref 70–99)
Glucose-Capillary: 122 mg/dL — ABNORMAL HIGH (ref 70–99)
Glucose-Capillary: 112 mg/dL — ABNORMAL HIGH (ref 70–99)
Glucose-Capillary: 192 mg/dL — ABNORMAL HIGH (ref 70–99)

## 2019-12-05 LAB — CBC WITH DIFFERENTIAL/PLATELET
Abs Immature Granulocytes: 0.07 10*3/uL (ref 0.00–0.07)
Basophils Absolute: 0 10*3/uL (ref 0.0–0.1)
Basophils Relative: 0 %
Eosinophils Absolute: 0.1 10*3/uL (ref 0.0–0.5)
Eosinophils Relative: 1 %
HCT: 33.1 % — ABNORMAL LOW (ref 36.0–46.0)
Hemoglobin: 9.9 g/dL — ABNORMAL LOW (ref 12.0–15.0)
Immature Granulocytes: 1 %
Lymphocytes Relative: 22 %
Lymphs Abs: 2 10*3/uL (ref 0.7–4.0)
MCH: 27.1 pg (ref 26.0–34.0)
MCHC: 29.9 g/dL — ABNORMAL LOW (ref 30.0–36.0)
MCV: 90.7 fL (ref 80.0–100.0)
Monocytes Absolute: 0.9 10*3/uL (ref 0.1–1.0)
Monocytes Relative: 10 %
Neutro Abs: 5.9 10*3/uL (ref 1.7–7.7)
Neutrophils Relative %: 66 %
Platelets: 190 10*3/uL (ref 150–400)
RBC: 3.65 MIL/uL — ABNORMAL LOW (ref 3.87–5.11)
RDW: 14.1 % (ref 11.5–15.5)
WBC: 9 10*3/uL (ref 4.0–10.5)
nRBC: 0 % (ref 0.0–0.2)

## 2019-12-05 LAB — BLOOD CULTURE ID PANEL (REFLEXED)

## 2019-12-05 LAB — BASIC METABOLIC PANEL
Anion gap: 8 (ref 5–15)
BUN: 20 mg/dL (ref 8–23)
CO2: 21 mmol/L — ABNORMAL LOW (ref 22–32)
Calcium: 8.9 mg/dL (ref 8.9–10.3)
Chloride: 116 mmol/L — ABNORMAL HIGH (ref 98–111)
Creatinine, Ser: 1.04 mg/dL — ABNORMAL HIGH (ref 0.44–1.00)
GFR calc Af Amer: 60 mL/min — ABNORMAL LOW (ref >60)
GFR calc non Af Amer: 51 mL/min — ABNORMAL LOW (ref >60)
Glucose, Bld: 144 mg/dL — ABNORMAL HIGH (ref 70–99)
Potassium: 3.8 mmol/L (ref 3.5–5.1)
Sodium: 145 mmol/L (ref 135–145)

## 2019-12-05 LAB — PROTIME-INR
INR: 1.2 (ref 0.8–1.2)
Prothrombin Time: 14.7 seconds (ref 11.4–15.2)

## 2019-12-05 MED ORDER — SODIUM CHLORIDE 0.9 % IV SOLN
2.0000 g | Freq: Every day | INTRAVENOUS | Status: DC
  Administered 2019-12-05 – 2019-12-09 (×5): 2 g via INTRAVENOUS
  Filled 2019-12-05 (×3): qty 2
  Filled 2019-12-05: qty 20
  Filled 2019-12-05: qty 2

## 2019-12-05 MED ORDER — NALOXONE HCL 0.4 MG/ML IJ SOLN
INTRAMUSCULAR | Status: AC
  Filled 2019-12-05: qty 1

## 2019-12-05 MED ORDER — FENTANYL CITRATE (PF) 100 MCG/2ML IJ SOLN
INTRAMUSCULAR | Status: AC
  Filled 2019-12-05: qty 2

## 2019-12-05 MED ORDER — MIDAZOLAM HCL 2 MG/2ML IJ SOLN
INTRAMUSCULAR | Status: AC
  Filled 2019-12-05: qty 4

## 2019-12-05 MED ORDER — LIDOCAINE HCL (PF) 1 % IJ SOLN
INTRAMUSCULAR | Status: AC
  Filled 2019-12-05: qty 30

## 2019-12-05 MED ORDER — LIDOCAINE HCL (PF) 1 % IJ SOLN
INTRAMUSCULAR | Status: AC | PRN
  Administered 2019-12-05: 10 mL

## 2019-12-05 MED ORDER — FENTANYL CITRATE (PF) 100 MCG/2ML IJ SOLN
INTRAMUSCULAR | Status: AC | PRN
  Administered 2019-12-05 (×2): 50 ug via INTRAVENOUS

## 2019-12-05 MED ORDER — MIDAZOLAM HCL 2 MG/2ML IJ SOLN
INTRAMUSCULAR | Status: AC | PRN
  Administered 2019-12-05 (×2): 1 mg via INTRAVENOUS

## 2019-12-05 NOTE — Progress Notes (Addendum)
PROGRESS NOTE    Doris Jefferson  FUX:[id number] DOB: November 26, 1940 DOA: 12/02/2019 PCP: Aletha Halim., PA-C    Brief Narrative:   Doris Jefferson is a 79 year old Caucasian female with past medical history remarkable for essential hypertension, hyperlipidemia, type 2 diabetes mellitus, CAD status post CABG, obesity/OSA, depression, GERD, paroxysmal atrial fibrillation, iron deficiency anemia who presented to the ED with intractable back pain.  She was in her usual state of health until Saturday night when she turned awkwardly in her bed trying to get out and use the bathroom and had sudden onset severe back pain.  She reports it is localized to the low back, described as a bandlike sensation that is sharp.  No recent injuries or falls.  No recent similar events, although stated this occurred with similar symptoms 25 years ago.  Now requiring the assistance of her husband to get a bed and use of a walker, which she previously has not had to use.  She also reports poor oral intake due to lack of appetite in the setting of severe pain.  Denies any nausea, vomiting or diarrhea.   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  CT abdomen/pelvis with no acute musculoskeletal findings.  MR L-spine for fluid disc interspace with endplate edema and infiltration of anterior paraspinous fat T12-L1 suspicious for discitis  and osteomyelitis.  Also noted questionable phlegmon versus disc bulge at the T12-L1 level.  ESR 64 with CRP 15.0.  Discussed with neurosurgery, Dr. Vertell Limber; states difficult to discern given lack of contrast but states there is some rind formation and recommends to proceed with IR biopsy/aspiration.  TTE with EF 02-54%, grade 2 diastolic function, RV systolic function mildly reduced, mild MR, mild aortic valve stenosis, normal IVC. --Pain control with oxycodone 5 mg p.o. every 4 hours as needed, Robaxin 500 mg p.o. twice daily --Dilaudid 37m IV q3h prn for breakthrough pain --Blood cultures x2: Positive for GPC's; BCID Streptococcus, await further identification/susceptibilities --IR biopsy/aspiration: Pending today --ID following, appreciate assistance; recommend starting vancomycin/ceftriaxone following IR aspiration --Hold on PICC line placement with blood cultures now positive as above --Follow CBC, fever curve daily  Leukocytosis: resolved Suspect stress to marginalization with a history of acute pain as above.  Afebrile.  Resolved with IV fluid hydration. --13.1-->13.9-->8.8-->9.0 --Monitor CBC daily  CAD s/p CABG Paroxysmal Atrial Fibrillation s/p LAA ligation s/p Bioprosthetic AVR --continue aspirin, carvedilol, rosuvastatin --follows with cardiology  Pulmonary HTN OSA --continue sildenafil, CPAP  CKD stage 3b Baseline creatinine between 1.1-1.5 with GFR between 36-40 --Cr 1.40-->1.29-->1.12-->1.04 --monitor I/O --avoid nephrotoxic agents and dose accordingly --Repeat renal function in a.m.  T2DM On Metformin 1000 mg p.o. twice daily, Jardiance, Victoza outpatient. --Hold home outpatient regimen while hospitalized --ISS and CBG   Essential HTN --continue carvedilol 12.5 mg p.o. twice daily  HLD --continue rosuvastatin  Depression --continue bupropion   DVT prophylaxis: Lovenox Code Status:  Full code Family Communication: Discussed patient extensively at  bedside, no family members present this morning Disposition Plan:      Patient is from: Home with husband     Anticipated Disposition:  Home     Barriers to discharge or conditions that needs to be met prior to discharge: Other work-up of suspected T12-L1 osteomyelitis/discitis, pending IR biopsy/aspiration, initiation of antibiotics following IR procedure  Consultants:   Interventional radiology  Infectious disease - Dr. Johnnye Sima  Neurosurgery - Dr. Vertell Limber, case discussed on 12/04/2019  Procedures:   None  Antimicrobials:   None   Subjective: Patient seen and examined bedside, resting comfortably in bed.  No family present this morning.  Continues with low back pain, no significant improvement.  Pending IR aspiration/biopsy today.  Plan to start antibiotics following procedure per infectious disease.  No other specific complaints at this time.  Denies any focal weakness to her extremities or paresthesias, no headache, no visual changes, no chest pain, no palpitations, no shortness of breath, no abdominal pain, no fatigue.  No acute events overnight per nursing staff.  Objective: Vitals:   12/04/19 0515 12/04/19 1259 12/04/19 2156 12/05/19 0535  BP: (!) 127/54 (!) 137/56 (!) 121/43 (!) 163/58  Pulse: 87 81 79 87  Resp: _0 Temp: 99.1 F (37.3 C) 99.5 F (37.5 C) 98.4 F (36.9 C) 98 F (36.7 C)  TempSrc: Oral  Oral Oral  SpO2: 97% 100% 98% 100%  Weight:      Height:        Intake/Output Summary (Last 24 hours) at 12/05/2019 1138 Last data filed at 12/04/2019 1627 Gross per 24 hour  Intake 240 ml  Output --  Net 240 ml   Filed Weights   12/02/19 1655  Weight: 77.1 kg    Examination:  General exam: Appears calm and comfortable  Respiratory system: Clear to auscultation. Respiratory effort normal. Cardiovascular system: S1 & S2 heard, RRR. No JVD, murmurs, rubs, gallops or clicks. No pedal edema. Gastrointestinal system: Abdomen is nondistended, soft and  nontender. No organomegaly or masses felt. Normal bowel sounds heard. Central nervous system: Alert and oriented. No focal neurological deficits. Extremities: Symmetric 5 x 5 power.  Exquisitely tender to palpation midline spinous processes low T-spine/upper L-spine. Skin: No rashes, lesions or ulcers Psychiatry: Judgement and insight appear normal. Mood & affect appropriate.     Data Reviewed: I have personally reviewed following labs and imaging studies  CBC: Recent Labs  Lab 12/02/19 2053 12/03/19 0514 12/04/19 0439 12/05/19 0420  WBC 13.1* 13.9* 8.8 9.0  NEUTROABS  --   --   --  5.9  HGB 10.3* 10.1* 9.6* 9.9*  HCT 33.4* 32.3* 30.6* 33.1*  MCV 88.1 88.7 86.7 90.7  PLT 223 217 183 426   Basic Metabolic Panel: Recent Labs  Lab 12/02/19 2053 12/03/19 0514 12/04/19 0439 12/05/19 0420  NA 140 142 143 145  K 3.8 3.9 3.4* 3.8  CL 106 110 115* 116*  CO2 16* 16* 17* 21*  GLUCOSE 210* 175* 170* 144*  BUN 34* 31* 23 20  CREATININE 1.40* 1.29* 1.12* 1.04*  CALCIUM 9.1 8.8* 8.4* 8.9   GFR: Estimated Creatinine Clearance: 43.8 mL/min (A) (by C-G formula based on SCr of 1.04 mg/dL (H)). Liver Function Tests: Recent Labs  Lab 12/02/19 2053  AST 21  ALT 16  ALKPHOS 82  BILITOT 1.2  PROT 6.9  ALBUMIN 3.2*   No results for input(s): LIPASE, AMYLASE in the last 168  hours. No results for input(s): AMMONIA in the last 168 hours. Coagulation Profile: Recent Labs  Lab 12/05/19 0420  INR 1.2   Cardiac Enzymes: No results for input(s): CKTOTAL, CKMB, CKMBINDEX, TROPONINI in the last 168 hours. BNP (last 3 results) No results for input(s): PROBNP in the last 8760 hours. HbA1C: Recent Labs    12/03/19 0514  HGBA1C 6.9*   CBG: Recent Labs  Lab 12/03/19 2028 12/04/19 0730 12/04/19 1140 12/04/19 1647 12/05/19 0822  GLUCAP 180* 138* 176* 141* 136*   Lipid Profile: No results for input(s): CHOL, HDL, LDLCALC, TRIG, CHOLHDL, LDLDIRECT in the last 72 hours. Thyroid  Function Tests: No results for input(s): TSH, T4TOTAL, FREET4, T3FREE, THYROIDAB in the last 72 hours. Anemia Panel: No results for input(s): VITAMINB12, FOLATE, FERRITIN, TIBC, IRON, RETICCTPCT in the last 72 hours. Sepsis Labs: No results for input(s): PROCALCITON, LATICACIDVEN in the last 168 hours.  Recent Results (from the past 240 hour(s))  SARS CORONAVIRUS 2 (TAT 6-24 HRS) Nasopharyngeal Nasopharyngeal Swab     Status: None   Collection Time: 12/03/19 12:44 AM   Specimen: Nasopharyngeal Swab  Result Value Ref Range Status   SARS Coronavirus 2 NEGATIVE NEGATIVE Final    Comment: (NOTE) SARS-CoV-2 target nucleic acids are NOT DETECTED. The SARS-CoV-2 RNA is generally detectable in upper and lower respiratory specimens during the acute phase of infection. Negative results do not preclude SARS-CoV-2 infection, do not rule out co-infections with other pathogens, and should not be used as the sole basis for treatment or other patient management decisions. Negative results must be combined with clinical observations, patient history, and epidemiological information. The expected result is Negative. Fact Sheet for Patients: SugarRoll.be Fact Sheet for Healthcare Providers: https://www.woods-mathews.com/ This test is not yet approved or cleared by the [place] FDA and  has been authorized for detection and/or diagnosis of SARS-CoV-2 by FDA under an Emergency Use Authorization (EUA). This EUA will remain  in effect (meaning this test can be used) for the duration of the COVID-19 declaration under Section 56 4(b)(1) of the Act, 21 U.S.C. section 360bbb-3(b)(1), unless the authorization is terminated or revoked sooner. Performed at [facility], [place] [address], [place], [place] [address]   Culture, blood (routine x 2)     Status: None (Preliminary result)   Collection Time: 12/04/19  1:46 PM   Specimen: BLOOD  Result Value Ref  Range Status   Specimen Description   Final    BLOOD LEFT ANTECUBITAL Performed at [facility] [address] [place], [place] [address]    Special Requests   Final    BOTTLES DRAWN AEROBIC AND ANAEROBIC Blood Culture adequate volume Performed at [facility] [address] [place], [place] [address]    Culture  Setup Time   Final    GRAM POSITIVE COCCI IN CHAINS ANAEROBIC BOTTLE ONLY Organism ID to follow Performed at [facility], [place] [address], [place], [place] [address]    Culture GRAM POSITIVE COCCI  Final   Report Status PENDING  Incomplete  Culture, blood (routine x 2)     Status: None (Preliminary result)   Collection Time: 12/04/19  1:59 PM   Specimen: BLOOD  Result Value Ref Range Status   Specimen Description   Final    BLOOD RIGHT ANTECUBITAL Performed at [facility] [address], [place], [place] [address]    Special Requests   Final    BOTTLES DRAWN AEROBIC AND ANAEROBIC  Blood Culture adequate volume Performed at [facility] [address], [place], [place] [address]    Culture  Setup Time   Final    GRAM POSITIVE COCCI IN CHAINS IN BOTH AEROBIC AND ANAEROBIC BOTTLES Performed at [facility], [place] [address] [place], [place] [address]    Culture GRAM POSITIVE COCCI  Final   Report Status PENDING  Incomplete         Radiology Studies: MR LUMBAR SPINE WO CONTRAST  Result Date: 12/04/2019 CLINICAL DATA:  Intractable low back pain since a twisting injury the night of 11/29/2019. Initial encounter. EXAM: MRI LUMBAR SPINE WITHOUT CONTRAST TECHNIQUE: Multiplanar, multisequence MR imaging of the lumbar spine was performed. No intravenous contrast was administered. COMPARISON:  CT abdomen and pelvis 12/02/2019 and plain films lumbar spine 12/01/2019 FINDINGS: Segmentation: The patient has transitional lumbosacral anatomy. Numbering scheme on this examination is based  on the lowest visible pair of ribs on the prior plain films and CT scan. Based on this numbering scheme, the L5 segment is sacralized and transitional. Alignment:  Maintained. Vertebrae: There is some fluid in the disc interspace at T12-L1 and loss of normal fat signal and paraspinous fat anterior to the disc interspace. Mild edema is present in the endplates. No fracture. Conus medullaris and cauda equina: Conus extends to the L1 level. Conus and cauda equina appear normal. Paraspinal and other soft tissues: See report of abdomen and pelvis CT scan. Disc levels: T10-11 and T11-12 are imaged in the sagittal plane only. There is mild disc bulging at both levels but no stenosis. T12-L1: There is a broad-based disc bulge or phlegmon effacing the ventral thecal sac. A very thin focus of epidural fluid is seen just superior to the disc interspace on sagittal image 8 of series 7. There is moderate central canal stenosis. Foramina are open. L1-2: Broad-based disc bulge causes mild to moderate central canal stenosis. Foramina are open. L2-3: Bilateral facet degenerative change, ligamentum flavum thickening and a shallow disc bulge. There is mild to moderate central canal stenosis. Foramina are open. L3-4: Facet degenerative disease, mild disc bulge and ligamentum flavum thickening. There is mild central canal stenosis. Foramina are open. L4-5: Central and eccentric to the right broad-based protrusion causes mild central canal stenosis and narrowing in the right subarticular recess. There is some facet degenerative change. Foramina are open. L5-S1: Transitional segment.  Negative. IMPRESSION: Transitional lumbosacral anatomy. Please see numbering scheme above and correlate with plain films if any intervention is planned. Fluid in the disc interspace with endplate edema and infiltration of anterior paraspinous fat at T12-L1 could be due to degenerative change but is most suspicious for discitis and osteomyelitis. Likely  ventral epidural phlegmon narrows the ventral thecal sac causing mild to moderate central canal stenosis at T12-L1. Moderate central canal stenosis L2-3. Mild central canal stenosis at L4-5 where there is also narrowing in the right subarticular recess which could impact the right L5 root. Electronically Signed   By: Inge Rise M.D.   On: 12/04/2019 12:06   ECHOCARDIOGRAM COMPLETE  Result Date: 12/04/2019    ECHOCARDIOGRAM REPORT   Patient Name:   Doris Jefferson Date of Exam: 12/04/2019 Medical Rec #:  [id number]      Height:       63.0 in Accession #:    [id number]     Weight:       170.0 lb Date of Birth:  1940-12-28       BSA:  1.805 m Patient Age:    14 years       BP:           137/56 mmHg Patient Gender: F              HR:           78 bpm. Exam Location:  Inpatient Procedure: 2D Echo, Cardiac Doppler, Color Doppler and Intracardiac            Opacification Agent Indications:    Murmur 785.2  History:        Patient has prior history of Echocardiogram examinations, most                 recent 05/29/2019. Prior CABG, Pulmonary HTN; Risk                 Factors:Hypertension, Diabetes, Dyslipidemia and Non-Smoker.                 Aortic Valve: 21 mm valve is present in the aortic position.                 Procedure Date: 09/27/2018.  Sonographer:    Vickie Epley RDCS Referring Phys: [id number] Onesimo Lingard J [place] IMPRESSIONS  1. Left ventricular ejection fraction, by estimation, is 60 to 65%. The left ventricle has normal function. The left ventricle has no regional wall motion abnormalities. Left ventricular diastolic parameters are consistent with Grade II diastolic dysfunction (pseudonormalization). Elevated left ventricular end-diastolic pressure.  2. Right ventricular systolic function is mildly reduced. The right ventricular size is normal. There is moderately elevated pulmonary artery systolic pressure.  3. The mitral valve is normal in structure and function. Mild mitral valve regurgitation. No  evidence of mitral stenosis.  4. The aortic valve is tricuspid. Aortic valve regurgitation is not visualized. Mild aortic valve stenosis. There is a 21 mm valve present in the aortic position. Procedure Date: 09/27/2018.  5. The inferior vena cava is normal in size with greater than 50% respiratory variability, suggesting right atrial pressure of 3 mmHg. FINDINGS  Left Ventricle: Left ventricular ejection fraction, by estimation, is 60 to 65%. The left ventricle has normal function. The left ventricle has no regional wall motion abnormalities. Definity contrast agent was given IV to delineate the left ventricular  endocardial borders. The left ventricular internal cavity size was normal in size. There is no left ventricular hypertrophy. Left ventricular diastolic parameters are consistent with Grade II diastolic dysfunction (pseudonormalization). Elevated left ventricular end-diastolic pressure. Right Ventricle: The right ventricular size is normal. No increase in right ventricular wall thickness. Right ventricular systolic function is mildly reduced. There is moderately elevated pulmonary artery systolic pressure. The tricuspid regurgitant velocity is 3.87 m/s, and with an assumed right atrial pressure of 3 mmHg, the estimated right ventricular systolic pressure is 18.2 mmHg. Left Atrium: Left atrial size was normal in size. Right Atrium: Right atrial size was normal in size. Pericardium: There is no evidence of pericardial effusion. Mitral Valve: The mitral valve is normal in structure and function. There is moderate calcification of the anterior mitral valve leaflet(s). Normal mobility of the mitral valve leaflets. Moderate mitral annular calcification. Mild mitral valve regurgitation. No evidence of mitral valve stenosis. Tricuspid Valve: The tricuspid valve is normal in structure. Tricuspid valve regurgitation is mild . No evidence of tricuspid stenosis. Aortic Valve: The aortic valve is tricuspid. . There is  mild thickening and mild calcification of the aortic valve. Aortic valve regurgitation is not visualized. Mild  aortic stenosis is present. Mild to moderate aortic valve annular calcification. There is mild thickening of the aortic valve. There is mild calcification of the aortic valve. Aortic valve mean gradient measures 13.0 mmHg. Aortic valve peak gradient measures 22.8 mmHg. There is a 21 mm valve present in the aortic position. Procedure Date: 09/27/2018. Pulmonic Valve: The pulmonic valve was normal in structure. Pulmonic valve regurgitation is not visualized. No evidence of pulmonic stenosis. Aorta: The aortic root is normal in size and structure. Venous: The inferior vena cava is normal in size with greater than 50% respiratory variability, suggesting right atrial pressure of 3 mmHg. IAS/Shunts: No atrial level shunt detected by color flow Doppler.  LEFT VENTRICLE PLAX 2D LVIDd:         4.10 cm Diastology LVIDs:         2.90 cm LV e' lateral:   6.83 cm/s LV PW:         0.90 cm LV E/e' lateral: 21.7 LV IVS:        0.90 cm LV e' medial:    4.91 cm/s                        LV E/e' medial:  30.1  RIGHT VENTRICLE RV S prime:     7.62 cm/s TAPSE (M-mode): 1.5 cm LEFT ATRIUM             Index       RIGHT ATRIUM          Index LA diam:        4.00 cm 2.22 cm/m  RA Area:     8.74 cm LA Vol (A2C):   41.9 ml 23.22 ml/m RA Volume:   13.90 ml 7.70 ml/m LA Vol (A4C):   39.7 ml 22.00 ml/m LA Biplane Vol: 46.2 ml 25.60 ml/m  AORTIC VALVE AV Vmax:           238.50 cm/s AV Vmean:          172.500 cm/s AV VTI:            0.488 m AV Peak Grad:      22.8 mmHg AV Mean Grad:      13.0 mmHg LVOT Vmax:         148.00 cm/s LVOT Vmean:        97.500 cm/s LVOT VTI:          0.295 m LVOT/AV VTI ratio: 0.60 MITRAL VALVE                TRICUSPID VALVE MV Area (PHT): 3.30 cm     TR Peak grad:   59.9 mmHg MV Decel Time: 230 msec     TR Vmax:        387.00 cm/s MV E velocity: 148.00 cm/s MV A velocity: 108.00 cm/s  SHUNTS MV E/A ratio:   1.37         Systemic VTI: 0.30 m Fransico Him MD Electronically signed by Fransico Him MD Signature Date/Time: 12/04/2019/3:58:31 PM    Final         Scheduled Meds: . aspirin EC  81 mg Oral Daily  . buPROPion  300 mg Oral Daily  . carvedilol  12.5 mg Oral BID WC  . insulin aspart  0-5 Units Subcutaneous QHS  . insulin aspart  0-9 Units Subcutaneous TID WC  . loratadine  10 mg Oral Daily  . methocarbamol  1,000 mg Oral Q8H  . naloxone      .  rosuvastatin  20 mg Oral Daily  . senna-docusate  2 tablet Oral BID  . sildenafil  20 mg Oral TID  . sodium chloride flush  3 mL Intravenous Q12H   Continuous Infusions: . sodium chloride 100 mL/hr at 12/04/19 1652     LOS: 2 days    Time spent: 36 minutes spent on chart review, discussion with nursing staff, consultants, updating family and interview/physical exam; more than 50% of that time was spent in counseling and/or coordination of care.    Laneisha Mino J [place] DO [facility] Available via Epic secure chat 7am-7pm After these hours, please refer to coverage provider listed on [email or web address] 12/05/2019, 11:38 AM

## 2019-12-05 NOTE — Progress Notes (Signed)
Notified by pharm pts BCx growing strep.  Will start ceftriaxone.  Repeat BCx in AM.

## 2019-12-05 NOTE — Progress Notes (Signed)
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   OT comments  Pt able to progress OOB to [facility] with min A. Requires Mod A for ADL. Pt painful but wanting to participate. Husband present during session. Continue to recommend HHOT and [place] after DC. Will continue to follow acutely.  Follow Up Recommendations  Home health OT;Supervision/Assistance - 24 hour; [place] Aide   Equipment Recommendations  None recommended by OT    Recommendations for Other Services      Precautions / Restrictions Precautions Precautions: Fall Precaution Comments: followed back precautions to help with pain control       Mobility Bed Mobility Overal bed mobility: Needs Assistance Bed Mobility: Rolling;Sidelying to Sit;Sit to Sidelying Rolling: Min guard Sidelying to sit: Mod assist     Sit to sidelying: Mod assist    Transfers Overall transfer level: Needs assistance   Transfers: Sit to/from Stand Sit to Stand: Min guard Stand pivot transfers: Min assist            Balance Overall balance assessment: Needs assistance   Sitting balance-Leahy Scale: Fair       Standing balance-Leahy Scale: Poor                             ADL either performed or assessed with clinical judgement   ADL Overall ADL's : Needs assistance/impaired     Grooming: Set up;Sitting   Upper Body Bathing: Sitting;Minimal assistance   Lower Body Bathing: Moderate assistance;Sit to/from stand   Upper Body Dressing : Minimal  assistance;Sitting   Lower Body Dressing: Moderate assistance;Sit to/from stand   Toilet Transfer: Minimal assistance;Stand-pivot;RW;BSC   Toileting- Clothing Manipulation and Hygiene: Moderate assistance;Sit to/from stand       Functional mobility during ADLs: Minimal assistance;Rolling walker;Cueing for safety General ADL Comments: May benefit from AE     Vision       Perception     Praxis      Cognition Arousal/Alertness: Awake/alert Behavior During Therapy: WFL for tasks assessed/performed Overall Cognitive Status: Within Functional Limits for tasks assessed                                          Exercises     Shoulder Instructions       General Comments Husband present during session    Pertinent Vitals/ Pain       Pain Assessment: 0-10 Pain Score: 10-Worst pain ever Pain Location: back Pain Descriptors / Indicators: Guarding;Aching;Grimacing;Discomfort Pain Intervention(s): Limited activity within patient's tolerance;Repositioned;Patient requesting pain meds-RN notified  Home Living                                          Prior Functioning/Environment              Frequency  Min 2X/week  Progress Toward Goals  OT Goals(current goals can now be found in the care plan section)  Progress towards OT goals: Progressing toward goals  Acute Rehab OT Goals Patient Stated Goal: go home today. OT Goal Formulation: With patient Time For Goal Achievement: 12/09/19 ADL Goals Pt Will Perform Grooming: with supervision;standing Pt Will Perform Lower Body Dressing: sit to/from stand;with supervision;with adaptive equipment Pt Will Transfer to Toilet: with supervision;ambulating;stand pivot transfer;bedside commode Pt Will Perform Toileting - Clothing Manipulation and hygiene: with supervision;sit to/from stand  Plan Discharge plan remains appropriate    Co-evaluation                 AM-PAC OT "6  Clicks" Daily Activity     Outcome Measure   Help from another person eating meals?: None Help from another person taking care of personal grooming?: A Little Help from another person toileting, which includes using toliet, bedpan, or urinal?: A Lot Help from another person bathing (including washing, rinsing, drying)?: A Lot Help from another person to put on and taking off regular upper body clothing?: A Little Help from another person to put on and taking off regular lower body clothing?: A Lot 6 Click Score: 16    End of Session Equipment Utilized During Treatment: Gait belt;Rolling walker  OT Visit Diagnosis: Unsteadiness on feet (R26.81);Other abnormalities of gait and mobility (R26.89);Repeated falls (R29.6);Muscle weakness (generalized) (M62.81)   Activity Tolerance Patient limited by pain   Patient Left in bed;with call bell/phone within reach;with family/visitor present   Nurse Communication Mobility status        Time: 1230-1300 OT Time Calculation (min): 30 min  Charges:    Maurie Boettcher, OT/L   Acute OT Clinical Specialist [place] Pager [phone] Office [phone]    [facility] 12/05/2019, 1:39 PM

## 2019-12-05 NOTE — Procedures (Signed)
Interventional Radiology Procedure Note  Procedure: Aspiration of T12-L1 disc yielding 1 mL serosanguinous fluid.   Complications: None  Estimated Blood Loss: None  Recommendations: - Cultures sent  Signed,  Criselda Peaches, MD

## 2019-12-05 NOTE — Care Management Important Message (Signed)
Important Message  Patient Details IM Letter given to Marney Doctor RN Case Manager to present to the Patient Name: Doris Jefferson MRN: [id number] Date of Birth: 1941-02-26   Medicare Important Message Given:  Yes     Kerin Salen 12/05/2019, 9:36 AM

## 2019-12-05 NOTE — Progress Notes (Signed)
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  Per notes back pain from osteomyelitis vs discitis.  Pt s/p aspiration of T12-L1 disc on 12/05/19 around 1600.    PT Comments    Pt was able to ambulate increased distance today of 80'.  Requires min cues for transfer and gait techniques for pain control.  Continue to advance as able.    Follow Up Recommendations  Home health PT;Supervision/Assistance - 24 hour     Equipment Recommendations  Other (comment)(possible BSC - spouse checking to see if one at home)    Recommendations for Other Services       Precautions / Restrictions Precautions Precaution Comments: followed back precautions to help with pain control    Mobility  Bed Mobility Overal bed mobility: Needs Assistance             General bed mobility comments: Pt in recliner at arrival;  Pt educated on positions for comfort when in bed or recliner (knees flexed, feet supported on floor)  Transfers Overall transfer level: Needs assistance Equipment used: Rolling walker (2 wheeled) Transfers: Sit to/from Stand Sit to Stand: Min guard         General transfer comment: increased time to push up from recliner and decends slowly; cues to not twist  Ambulation/Gait Ambulation/Gait assistance: Min guard Gait Distance (Feet): 80 Feet Assistive device: Rolling walker (2 wheeled) Gait Pattern/deviations: Step-through pattern;Decreased stride length Gait velocity: decreased   General Gait Details: min  cues for RW; limited distance due to pain   Stairs             Wheelchair Mobility    Modified Rankin (Stroke Patients Only)       Balance Overall balance assessment: Needs assistance Sitting-balance support: Bilateral upper extremity supported Sitting balance-Leahy Scale: Fair Sitting balance - Comments: limited due to pain   Standing balance support: During functional activity;Bilateral upper extremity supported Standing balance-Leahy Scale: Fair                              Cognition Arousal/Alertness: Awake/alert Behavior During Therapy: WFL for tasks assessed/performed Overall Cognitive Status: Within Functional Limits for tasks assessed                                        Exercises      General Comments General comments (skin integrity, edema, etc.): Noted bedrest orders for hour post procedure then ambulate as tolerated.  Pt had just got up to chair at PT arrival. RN was in room.      Pertinent Vitals/Pain Pain Assessment: 0-10 Pain Score: 7  Pain Location: back Pain Descriptors / Indicators: Guarding;Aching;Grimacing;Discomfort Pain Intervention(s): Limited activity within patient's tolerance;Repositioned;Monitored during session(pt wanting to walk)    Home Living                      Prior Function  PT Goals (current goals can now be found in the care plan section) Progress towards PT goals: Progressing toward goals    Frequency    Min 3X/week      PT Plan Current plan remains appropriate    Co-evaluation              AM-PAC PT "6 Clicks" Mobility   Outcome Measure  Help needed turning from your back to your side while in a flat bed without using bedrails?: A Little Help needed moving from lying on your back to sitting on the side of a flat bed without using bedrails?: A Lot Help needed moving to and from a bed to a chair (including a wheelchair)?: A Little Help needed standing  up from a chair using your arms (e.g., wheelchair or bedside chair)?: A Little Help needed to walk in hospital room?: A Little Help needed climbing 3-5 steps with a railing? : A Lot 6 Click Score: 16    End of Session Equipment Utilized During Treatment: Gait belt Activity Tolerance: Patient tolerated treatment well Patient left: in chair;with call bell/phone within reach Nurse Communication: Mobility status PT Visit Diagnosis: Unsteadiness on feet (R26.81);Difficulty in walking, not elsewhere classified (R26.2);Pain     Time: 1720-1730 PT Time Calculation (min) (ACUTE ONLY): 10 min  Charges:  $Gait Training: 8-22 mins                     Maggie Font, PT Acute Rehab Services Pager [phone] [facility] [phone] [facility] [phone]    Karlton Lemon 12/05/2019, 5:40 PM

## 2019-12-05 NOTE — Progress Notes (Signed)
Patient refuses CPAP while here at the hospital

## 2019-12-05 NOTE — Progress Notes (Signed)
PHARMACY - PHYSICIAN COMMUNICATION CRITICAL VALUE ALERT - BLOOD CULTURE IDENTIFICATION (BCID)  Doris Jefferson is an 79 y.o. female who presented to [facility] on 12/02/2019 with a chief complaint of back pain  Assessment:  T12-L1 osteo, discitis with phlegmon  Name of physician (or Provider) Contacted: [place] [facility] [place] via [place]  Current antibiotics: none  Changes to prescribed antibiotics recommended:  Plan to start Ceftriaxone and vancomycin after IR aspirate  Results for orders placed or performed during the hospital encounter of 12/02/19  Blood Culture ID Panel (Reflexed) (Collected: 12/04/2019  1:46 PM)  Result Value Ref Range   Enterococcus species NOT DETECTED NOT DETECTED   Listeria monocytogenes NOT DETECTED NOT DETECTED   Staphylococcus species NOT DETECTED NOT DETECTED   Staphylococcus aureus (BCID) NOT DETECTED NOT DETECTED   Streptococcus species DETECTED (A) NOT DETECTED   Streptococcus agalactiae NOT DETECTED NOT DETECTED   Streptococcus pneumoniae NOT DETECTED NOT DETECTED   Streptococcus pyogenes NOT DETECTED NOT DETECTED   Acinetobacter baumannii NOT DETECTED NOT DETECTED   Enterobacteriaceae species NOT DETECTED NOT DETECTED   Enterobacter cloacae complex NOT DETECTED NOT DETECTED   Escherichia coli NOT DETECTED NOT DETECTED   Klebsiella oxytoca NOT DETECTED NOT DETECTED   Klebsiella pneumoniae NOT DETECTED NOT DETECTED   Proteus species NOT DETECTED NOT DETECTED   Serratia marcescens NOT DETECTED NOT DETECTED   Haemophilus influenzae NOT DETECTED NOT DETECTED   Neisseria meningitidis NOT DETECTED NOT DETECTED   Pseudomonas aeruginosa NOT DETECTED NOT DETECTED   Candida albicans NOT DETECTED NOT DETECTED   Candida glabrata NOT DETECTED NOT DETECTED   Candida krusei NOT DETECTED NOT DETECTED   Candida parapsilosis NOT DETECTED NOT DETECTED   Candida tropicalis NOT DETECTED NOT DETECTED    Eudelia Bunch, Pharm.D 12/05/2019 1:09 PM

## 2019-12-05 NOTE — Progress Notes (Signed)
INFECTIOUS DISEASE PROGRESS NOTE  ID: Doris Jefferson is a 79 y.o. female with  Principal Problem:   Intractable pain Active Problems:   Pulmonary HTN (HCC)   Diabetes mellitus (HCC)   OSA (obstructive sleep apnea)   HTN (hypertension)   Esophageal reflux   S/P CABG x 3   S/P AVR (aortic valve replacement)   Diabetes mellitus type 2 in obese (HCC)   PAF (paroxysmal atrial fibrillation) (HCC)   Back pain  Subjective: Continued pain.   Abtx:  Anti-infectives (From admission, onward)   None      Medications:  Scheduled:  aspirin EC  81 mg Oral Daily   buPROPion  300 mg Oral Daily   carvedilol  12.5 mg Oral BID WC   insulin aspart  0-5 Units Subcutaneous QHS   insulin aspart  0-9 Units Subcutaneous TID WC   loratadine  10 mg Oral Daily   methocarbamol  1,000 mg Oral Q8H   naloxone       rosuvastatin  20 mg Oral Daily   senna-docusate  2 tablet Oral BID   sildenafil  20 mg Oral TID   sodium chloride flush  3 mL Intravenous Q12H    Objective: Vital signs in last 24 hours: Temp:  [98 F (36.7 C)-99.5 F (37.5 C)] 98 F (36.7 C) (02/26 0535) Pulse Rate:  [79-87] 87 (02/26 0535) Resp:  [14-18] 16 (02/26 0535) BP: (121-163)/(43-58) 163/58 (02/26 0535) SpO2:  [98 %-100 %] 100 % (02/26 0535)   General appearance: alert, cooperative and no distress Resp: clear to auscultation bilaterally Cardio: regular rate and rhythm GI: normal findings: bowel sounds normal and soft, non-tender  Lab Results Recent Labs    12/04/19 0439 12/05/19 0420  WBC 8.8 9.0  HGB 9.6* 9.9*  HCT 30.6* 33.1*  NA 143 145  K 3.4* 3.8  CL 115* 116*  CO2 17* 21*  BUN 23 20  CREATININE 1.12* 1.04*   Liver Panel Recent Labs    12/02/19 2053  PROT 6.9  ALBUMIN 3.2*  AST 21  ALT 16  ALKPHOS 82  BILITOT 1.2   Sedimentation Rate Recent Labs    12/04/19 1542  ESRSEDRATE 64*   C-Reactive Protein Recent Labs    12/04/19 1542  CRP 15.0*    Microbiology: Recent  Results (from the past 240 hour(s))  SARS CORONAVIRUS 2 (TAT 6-24 HRS) Nasopharyngeal Nasopharyngeal Swab     Status: None   Collection Time: 12/03/19 12:44 AM   Specimen: Nasopharyngeal Swab  Result Value Ref Range Status   SARS Coronavirus 2 NEGATIVE NEGATIVE Final    Comment: (NOTE) SARS-CoV-2 target nucleic acids are NOT DETECTED. The SARS-CoV-2 RNA is generally detectable in upper and lower respiratory specimens during the acute phase of infection. Negative results do not preclude SARS-CoV-2 infection, do not rule out co-infections with other pathogens, and should not be used as the sole basis for treatment or other patient management decisions. Negative results must be combined with clinical observations, patient history, and epidemiological information. The expected result is Negative. Fact Sheet for Patients: SugarRoll.be Fact Sheet for Healthcare Providers: https://www.woods-mathews.com/ This test is not yet approved or cleared by the [place] FDA and  has been authorized for detection and/or diagnosis of SARS-CoV-2 by FDA under an Emergency Use Authorization (EUA). This EUA will remain  in effect (meaning this test can be used) for the duration of the COVID-19 declaration under Section 56 4(b)(1) of the Act, 21 U.S.C. section 360bbb-3(b)(1), unless the  authorization is terminated or revoked sooner. Performed at [facility], [place] [address] [place], [place] [address]     Studies/Results: MR LUMBAR SPINE WO CONTRAST  Result Date: 12/04/2019 CLINICAL DATA:  Intractable low back pain since a twisting injury the night of 11/29/2019. Initial encounter. EXAM: MRI LUMBAR SPINE WITHOUT CONTRAST TECHNIQUE: Multiplanar, multisequence MR imaging of the lumbar spine was performed. No intravenous contrast was administered. COMPARISON:  CT abdomen and pelvis 12/02/2019 and plain films lumbar spine 12/01/2019 FINDINGS: Segmentation: The  patient has transitional lumbosacral anatomy. Numbering scheme on this examination is based on the lowest visible pair of ribs on the prior plain films and CT scan. Based on this numbering scheme, the L5 segment is sacralized and transitional. Alignment:  Maintained. Vertebrae: There is some fluid in the disc interspace at T12-L1 and loss of normal fat signal and paraspinous fat anterior to the disc interspace. Mild edema is present in the endplates. No fracture. Conus medullaris and cauda equina: Conus extends to the L1 level. Conus and cauda equina appear normal. Paraspinal and other soft tissues: See report of abdomen and pelvis CT scan. Disc levels: T10-11 and T11-12 are imaged in the sagittal plane only. There is mild disc bulging at both levels but no stenosis. T12-L1: There is a broad-based disc bulge or phlegmon effacing the ventral thecal sac. A very thin focus of epidural fluid is seen just superior to the disc interspace on sagittal image 8 of series 7. There is moderate central canal stenosis. Foramina are open. L1-2: Broad-based disc bulge causes mild to moderate central canal stenosis. Foramina are open. L2-3: Bilateral facet degenerative change, ligamentum flavum thickening and a shallow disc bulge. There is mild to moderate central canal stenosis. Foramina are open. L3-4: Facet degenerative disease, mild disc bulge and ligamentum flavum thickening. There is mild central canal stenosis. Foramina are open. L4-5: Central and eccentric to the right broad-based protrusion causes mild central canal stenosis and narrowing in the right subarticular recess. There is some facet degenerative change. Foramina are open. L5-S1: Transitional segment.  Negative. IMPRESSION: Transitional lumbosacral anatomy. Please see numbering scheme above and correlate with plain films if any intervention is planned. Fluid in the disc interspace with endplate edema and infiltration of anterior paraspinous fat at T12-L1 could be  due to degenerative change but is most suspicious for discitis and osteomyelitis. Likely ventral epidural phlegmon narrows the ventral thecal sac causing mild to moderate central canal stenosis at T12-L1. Moderate central canal stenosis L2-3. Mild central canal stenosis at L4-5 where there is also narrowing in the right subarticular recess which could impact the right L5 root. Electronically Signed   By: Inge Rise M.D.   On: 12/04/2019 12:06   ECHOCARDIOGRAM COMPLETE  Result Date: 12/04/2019    ECHOCARDIOGRAM REPORT   Patient Name:   Doris Jefferson Date of Exam: 12/04/2019 Medical Rec #:  [id number]      Height:       63.0 in Accession #:    [id number]     Weight:       170.0 lb Date of Birth:  16-Sep-1941       BSA:          1.805 m Patient Age:    73 years       BP:           137/56 mmHg Patient Gender: F              HR:  78 bpm. Exam Location:  Inpatient Procedure: 2D Echo, Cardiac Doppler, Color Doppler and Intracardiac            Opacification Agent Indications:    Murmur 785.2  History:        Patient has prior history of Echocardiogram examinations, most                 recent 05/29/2019. Prior CABG, Pulmonary HTN; Risk                 Factors:Hypertension, Diabetes, Dyslipidemia and Non-Smoker.                 Aortic Valve: 21 mm valve is present in the aortic position.                 Procedure Date: 09/27/2018.  Sonographer:    Vickie Epley RDCS Referring Phys: [id number] ERIC J [place] IMPRESSIONS  1. Left ventricular ejection fraction, by estimation, is 60 to 65%. The left ventricle has normal function. The left ventricle has no regional wall motion abnormalities. Left ventricular diastolic parameters are consistent with Grade II diastolic dysfunction (pseudonormalization). Elevated left ventricular end-diastolic pressure.  2. Right ventricular systolic function is mildly reduced. The right ventricular size is normal. There is moderately elevated pulmonary artery systolic pressure.  3. The  mitral valve is normal in structure and function. Mild mitral valve regurgitation. No evidence of mitral stenosis.  4. The aortic valve is tricuspid. Aortic valve regurgitation is not visualized. Mild aortic valve stenosis. There is a 21 mm valve present in the aortic position. Procedure Date: 09/27/2018.  5. The inferior vena cava is normal in size with greater than 50% respiratory variability, suggesting right atrial pressure of 3 mmHg. FINDINGS  Left Ventricle: Left ventricular ejection fraction, by estimation, is 60 to 65%. The left ventricle has normal function. The left ventricle has no regional wall motion abnormalities. Definity contrast agent was given IV to delineate the left ventricular  endocardial borders. The left ventricular internal cavity size was normal in size. There is no left ventricular hypertrophy. Left ventricular diastolic parameters are consistent with Grade II diastolic dysfunction (pseudonormalization). Elevated left ventricular end-diastolic pressure. Right Ventricle: The right ventricular size is normal. No increase in right ventricular wall thickness. Right ventricular systolic function is mildly reduced. There is moderately elevated pulmonary artery systolic pressure. The tricuspid regurgitant velocity is 3.87 m/s, and with an assumed right atrial pressure of 3 mmHg, the estimated right ventricular systolic pressure is [id number] mmHg. Left Atrium: Left atrial size was normal in size. Right Atrium: Right atrial size was normal in size. Pericardium: There is no evidence of pericardial effusion. Mitral Valve: The mitral valve is normal in structure and function. There is moderate calcification of the anterior mitral valve leaflet(s). Normal mobility of the mitral valve leaflets. Moderate mitral annular calcification. Mild mitral valve regurgitation. No evidence of mitral valve stenosis. Tricuspid Valve: The tricuspid valve is normal in structure. Tricuspid valve regurgitation is mild . No  evidence of tricuspid stenosis. Aortic Valve: The aortic valve is tricuspid. . There is mild thickening and mild calcification of the aortic valve. Aortic valve regurgitation is not visualized. Mild aortic stenosis is present. Mild to moderate aortic valve annular calcification. There is mild thickening of the aortic valve. There is mild calcification of the aortic valve. Aortic valve mean gradient measures 13.0 mmHg. Aortic valve peak gradient measures 22.8 mmHg. There is a 21 mm valve present in the aortic position. Procedure Date: 09/27/2018.  Pulmonic Valve: The pulmonic valve was normal in structure. Pulmonic valve regurgitation is not visualized. No evidence of pulmonic stenosis. Aorta: The aortic root is normal in size and structure. Venous: The inferior vena cava is normal in size with greater than 50% respiratory variability, suggesting right atrial pressure of 3 mmHg. IAS/Shunts: No atrial level shunt detected by color flow Doppler.  LEFT VENTRICLE PLAX 2D LVIDd:         4.10 cm Diastology LVIDs:         2.90 cm LV e' lateral:   6.83 cm/s LV PW:         0.90 cm LV E/e' lateral: 21.7 LV IVS:        0.90 cm LV e' medial:    4.91 cm/s                        LV E/e' medial:  30.1  RIGHT VENTRICLE RV S prime:     7.62 cm/s TAPSE (M-mode): 1.5 cm LEFT ATRIUM             Index       RIGHT ATRIUM          Index LA diam:        4.00 cm 2.22 cm/m  RA Area:     8.74 cm LA Vol (A2C):   41.9 ml 23.22 ml/m RA Volume:   13.90 ml 7.70 ml/m LA Vol (A4C):   39.7 ml 22.00 ml/m LA Biplane Vol: 46.2 ml 25.60 ml/m  AORTIC VALVE AV Vmax:           238.50 cm/s AV Vmean:          172.500 cm/s AV VTI:            0.488 m AV Peak Grad:      22.8 mmHg AV Mean Grad:      13.0 mmHg LVOT Vmax:         148.00 cm/s LVOT Vmean:        97.500 cm/s LVOT VTI:          0.295 m LVOT/AV VTI ratio: 0.60 MITRAL VALVE                TRICUSPID VALVE MV Area (PHT): 3.30 cm     TR Peak grad:   59.9 mmHg MV Decel Time: 230 msec     TR Vmax:         387.00 cm/s MV E velocity: 148.00 cm/s MV A velocity: 108.00 cm/s  SHUNTS MV E/A ratio:  1.37         Systemic VTI: 0.30 m Fransico Him MD Electronically signed by Fransico Him MD Signature Date/Time: 12/04/2019/3:58:31 PM    Final      Assessment/Plan: T12-L1 osteo, discitis with phlegmon DM2  Total days of antibiotics: 0    BCx 2-25 are pending.  Await her aspirate in IR Start ceftriaxone/vanco after aspirate.  PIC line after aspirate A1C 6.9%       Bobby Rumpf MD, FACP Infectious Diseases (pager) [phone] [email or web address] 12/05/2019, 10:38 AM  LOS: 2 days

## 2019-12-06 DIAGNOSIS — E669 Obesity, unspecified: Secondary | ICD-10-CM

## 2019-12-06 DIAGNOSIS — E1169 Type 2 diabetes mellitus with other specified complication: Secondary | ICD-10-CM

## 2019-12-06 DIAGNOSIS — M545 Low back pain: Secondary | ICD-10-CM

## 2019-12-06 DIAGNOSIS — M5440 Lumbago with sciatica, unspecified side: Secondary | ICD-10-CM

## 2019-12-06 DIAGNOSIS — R52 Pain, unspecified: Secondary | ICD-10-CM

## 2019-12-06 LAB — GLUCOSE, CAPILLARY
Glucose-Capillary: 110 mg/dL — ABNORMAL HIGH (ref 70–99)
Glucose-Capillary: 168 mg/dL — ABNORMAL HIGH (ref 70–99)
Glucose-Capillary: 138 mg/dL — ABNORMAL HIGH (ref 70–99)
Glucose-Capillary: 191 mg/dL — ABNORMAL HIGH (ref 70–99)

## 2019-12-06 LAB — BASIC METABOLIC PANEL
Anion gap: 13 (ref 5–15)
BUN: 16 mg/dL (ref 8–23)
CO2: 17 mmol/L — ABNORMAL LOW (ref 22–32)
Calcium: 8.4 mg/dL — ABNORMAL LOW (ref 8.9–10.3)
Chloride: 114 mmol/L — ABNORMAL HIGH (ref 98–111)
Creatinine, Ser: 0.93 mg/dL (ref 0.44–1.00)
GFR calc Af Amer: >60 mL/min (ref >60)
GFR calc non Af Amer: 59 mL/min — ABNORMAL LOW (ref >60)
Glucose, Bld: 124 mg/dL — ABNORMAL HIGH (ref 70–99)
Potassium: 3.8 mmol/L (ref 3.5–5.1)
Sodium: 144 mmol/L (ref 135–145)

## 2019-12-06 LAB — CBC
HCT: 31.3 % — ABNORMAL LOW (ref 36.0–46.0)
Hemoglobin: 9.3 g/dL — ABNORMAL LOW (ref 12.0–15.0)
MCH: 27 pg (ref 26.0–34.0)
MCHC: 29.7 g/dL — ABNORMAL LOW (ref 30.0–36.0)
MCV: 90.7 fL (ref 80.0–100.0)
Platelets: 198 10*3/uL (ref 150–400)
RBC: 3.45 MIL/uL — ABNORMAL LOW (ref 3.87–5.11)
RDW: 14.3 % (ref 11.5–15.5)
WBC: 10 10*3/uL (ref 4.0–10.5)
nRBC: 0 % (ref 0.0–0.2)

## 2019-12-06 NOTE — Progress Notes (Signed)
[facility]                                                                              Patient Demographics  Doris [place], is a 79 y.o. female, DOB - [birth year], DXA:[id number]  Admit date - 12/02/2019   Admitting Physician Eric J [place] DO  Outpatient Primary MD for the patient is Aletha Halim., PA-C  Outpatient specialists:   LOS - 3  days   Medical records reviewed and are as summarized below:    Chief Complaint  Patient presents with  . Shortness of Breath       Brief summary   Doris [place] is a 79 year old Caucasian female with past medical history remarkable for essential hypertension, hyperlipidemia, type 2 diabetes mellitus, CAD status post CABG, obesity/OSA, depression, GERD, paroxysmal atrial fibrillation, iron deficiency anemia who presented to the ED with intractable back pain.  She was in her usual state of health until Saturday night when she turned awkwardly in her bed trying to get out and use the bathroom and had sudden onset severe back pain.  She reports it is localized to the low back, described as a bandlike sensation that is sharp.  No recent injuries or falls.   Hospital course so far MRI of the L-spine showed fluid to disc interspace with endplate edema, infiltration of anterior paraspinous fat T12-L1 suspicious for discitis and osteomyelitis, questionable phlegmon versus disc bulge at T12-L1 Status post IR biopsy, aspiration, started on IV Rocephin Blood cultures+ Streptococcus mitis/ oralis ID following.  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  Phlegmon versus broad-based disc bulge at T12-L1 Streptococcus bacteremia -Patient presented to ED with acute onset low back pain.  CT abdomen pelvis with no acute musculoskeletal findings.  MRI of L-spine showed T12-L1 discitis versus osteomyelitis, ?  Phlegmon versus disc bulge at the T12-L1 level -ESR 64,  CRP 15.0 -Dr. [place] discussed with neurosurgery, Dr. Joaquim Lai, difficult to discern given lack of contrast, recommended to proceed with IR biopsy/aspiration -2D echo showed EF of 60 to 20%, grade 2 diastolic dysfunction, mildly reduced RV systolic function, mild MR, mild aortic valve stenosis, normal IVC, no obvious vegetations -Status post IR biopsy/aspiration, cultures positive for strep mitis/oralis - blood cultures positive for Streptococcus mitis/oralis, sensitivities pending -Continue pain control -ID following, Continue IV Rocephin -Follow repeat blood cultures, 2/27   Leukocytosis -- Resolved, likely due to #1  CAD s/p CABG Paroxysmal Atrial Fibrillation s/p LAA ligation s/p Bioprosthetic AVR -Continue aspirin, Coreg, rosuvastatin, no acute issues   Pulmonary HTN -Continue sildenafil  OSA --Continue CPAP  CKD stage 3b Baseline creatinine between 1.1-1.5,  --Cr 1.40 at the time of admission, improved to 0.9 today  Diabetes mellitus type 2, NIDDM, controlled -Hold outpatient regimen, continue sliding scale insulin while inpatient  Essential HTN --BP stable, continue Coreg  HLD --Continue pravastatin  Depression -- Continue Wellbutrin  Code Status: Full CODE STATUS DVT Prophylaxis: SCDs Family Communication: Discussed all imaging results, lab results, explained to the patient    Disposition Plan: Patient from home.  Anticipated discharge home  when medically ready.  Blood cultures, cultures from the IR biopsy growing Streptococcus, on IV antibiotics, will likely need PICC line once repeat blood cultures for 72 hours are negative. All the above barriers precludes safe discharge at this time  Time Spent in minutes 35 minutes  Procedures:  IR biopsy/aspiration  Consultants:    Interventional radiology  Infectious disease - Dr. Johnnye Sima  Neurosurgery - Dr. Vertell Limber, case discussed on 12/04/2019  Antimicrobials:   Anti-infectives (From admission, onward)    Start     Dose/Rate Route Frequency Ordered Stop   12/05/19 1600  cefTRIAXone (ROCEPHIN) 2 g in sodium chloride 0.9 % 100 mL IVPB     2 g 200 mL/hr over 30 Minutes Intravenous Daily 12/05/19 1537            Medications  Scheduled Meds: . aspirin EC  81 mg Oral Daily  . buPROPion  300 mg Oral Daily  . carvedilol  12.5 mg Oral BID WC  . insulin aspart  0-5 Units Subcutaneous QHS  . insulin aspart  0-9 Units Subcutaneous TID WC  . loratadine  10 mg Oral Daily  . methocarbamol  1,000 mg Oral Q8H  . rosuvastatin  20 mg Oral Daily  . senna-docusate  2 tablet Oral BID  . sildenafil  20 mg Oral TID  . sodium chloride flush  3 mL Intravenous Q12H   Continuous Infusions: . sodium chloride 100 mL/hr at 12/06/19 0800  . cefTRIAXone (ROCEPHIN)  IV Stopped (12/05/19 1732)   PRN Meds:.acetaminophen **OR** acetaminophen, acetaminophen, HYDROmorphone (DILAUDID) injection, loperamide, LORazepam, oxyCODONE      Subjective:   Doris [place] was seen and examined today.  Continues to complain of low back pain but better improved with the pain medications now.  Status post IR aspiration/biopsy.  No focal neurological deficits. Patient denies dizziness, chest pain, shortness of breath, abdominal pain, N/V, new weakness, numbess, tingling. No acute events overnight.    Objective:   Vitals:   12/05/19 1605 12/05/19 2130 12/06/19 0527 12/06/19 1348  BP: (!) 166/68 (!) 134/55 (!) 141/66 (!) 130/53  Pulse: 83 (!) 58 84 81  Resp: (!) 23 18 18 16   Temp:  99.7 F (37.6 C) 98.9 F (37.2 C) 98.9 F (37.2 C)  TempSrc:  Oral Oral   SpO2: 98% 100% 98% 99%  Weight:      Height:        Intake/Output Summary (Last 24 hours) at 12/06/2019 1354 Last data filed at 12/06/2019 0900 Gross per 24 hour  Intake 2642.11 ml  Output --  Net 2642.11 ml     Wt Readings from Last 3 Encounters:  12/02/19 77.1 kg  11/29/19 77.1 kg  11/06/19 78.5 kg     Exam  General: Alert and oriented x 3,  NAD  Cardiovascular: S1 S2 auscultated, no murmurs, RRR  Respiratory: Clear to auscultation bilaterally, no wheezing, rales or rhonchi  Gastrointestinal: Soft, nontender, nondistended, + bowel sounds  Ext: no pedal edema bilaterally  Neuro: No new deficits, tender to palpation in low T-spine and upper L-spine area  Musculoskeletal: No digital cyanosis, clubbing  Skin: No rashes  Psych: Normal affect and demeanor, alert and oriented x3    Data Reviewed:  I have personally reviewed following labs and imaging studies  Micro Results Recent Results (from the past 240 hour(s))  SARS CORONAVIRUS 2 (TAT 6-24 HRS) Nasopharyngeal Nasopharyngeal Swab     Status: None   Collection Time: 12/03/19 12:44 AM   Specimen: Nasopharyngeal Swab  Result Value Ref Range Status   SARS Coronavirus 2 NEGATIVE NEGATIVE Final    Comment: (NOTE) SARS-CoV-2 target nucleic acids are NOT DETECTED. The SARS-CoV-2 RNA is generally detectable in upper and lower respiratory specimens during the acute phase of infection. Negative results do not preclude SARS-CoV-2 infection, do not rule out co-infections with other pathogens, and should not be used as the sole basis for treatment or other patient management decisions. Negative results must be combined with clinical observations, patient history, and epidemiological information. The expected result is Negative. Fact Sheet for Patients: SugarRoll.be Fact Sheet for Healthcare Providers: https://www.woods-mathews.com/ This test is not yet approved or cleared by the [place] FDA and  has been authorized for detection and/or diagnosis of SARS-CoV-2 by FDA under an Emergency Use Authorization (EUA). This EUA will remain  in effect (meaning this test can be used) for the duration of the COVID-19 declaration under Section 56 4(b)(1) of the Act, 21 U.S.C. section 360bbb-3(b)(1), unless the authorization is terminated  or revoked sooner. Performed at [facility], [place] [address] [place], [place] [address]   Culture, blood (routine x 2)     Status: Abnormal (Preliminary result)   Collection Time: 12/04/19  1:46 PM   Specimen: BLOOD  Result Value Ref Range Status   Specimen Description   Final    BLOOD LEFT ANTECUBITAL Performed at [facility] [address] [place], [place] [address]    Special Requests   Final    BOTTLES DRAWN AEROBIC AND ANAEROBIC Blood Culture adequate volume Performed at [facility] [address] [place], [place] [address]    Culture  Setup Time   Final    GRAM POSITIVE COCCI IN CHAINS IN BOTH AEROBIC AND ANAEROBIC BOTTLES CRITICAL RESULT CALLED TO, READ BACK BY AND VERIFIED WITH: Guadlupe Spanish PharmD 12:20 12/05/19 ([email or web address])    Culture (A)  Final    STREPTOCOCCUS MITIS/ORALIS SUSCEPTIBILITIES TO FOLLOW Performed at [facility], [address], [place], [place] [address]    Report Status PENDING  Incomplete  Blood Culture ID Panel (Reflexed)     Status: Abnormal   Collection Time: 12/04/19  1:46 PM  Result Value Ref Range Status   Enterococcus species NOT DETECTED NOT DETECTED Final   Listeria monocytogenes NOT DETECTED NOT DETECTED Final   Staphylococcus species NOT DETECTED NOT DETECTED Final   Staphylococcus aureus (BCID) NOT DETECTED NOT DETECTED Final   Streptococcus species DETECTED (A) NOT DETECTED Final    Comment: Not Enterococcus species, Streptococcus agalactiae, Streptococcus pyogenes, or Streptococcus pneumoniae. CRITICAL RESULT CALLED TO, READ BACK BY AND VERIFIED WITH: Guadlupe Spanish PharmD 12:20 12/05/19 ([email or web address])    Streptococcus agalactiae NOT DETECTED NOT DETECTED Final   Streptococcus pneumoniae NOT DETECTED NOT DETECTED Final   Streptococcus pyogenes NOT DETECTED NOT DETECTED Final   Acinetobacter baumannii NOT DETECTED NOT DETECTED Final   Enterobacteriaceae species NOT DETECTED NOT DETECTED  Final   Enterobacter cloacae complex NOT DETECTED NOT DETECTED Final   Escherichia coli NOT DETECTED NOT DETECTED Final   Klebsiella oxytoca NOT DETECTED NOT DETECTED Final   Klebsiella pneumoniae NOT DETECTED NOT DETECTED Final   Proteus species NOT DETECTED NOT DETECTED Final   Serratia marcescens NOT DETECTED NOT DETECTED Final   Haemophilus influenzae NOT DETECTED NOT DETECTED Final   Neisseria meningitidis NOT DETECTED NOT DETECTED Final   Pseudomonas aeruginosa NOT DETECTED NOT DETECTED Final   Candida albicans NOT DETECTED NOT DETECTED Final   Candida glabrata  NOT DETECTED NOT DETECTED Final   Candida krusei NOT DETECTED NOT DETECTED Final   Candida parapsilosis NOT DETECTED NOT DETECTED Final   Candida tropicalis NOT DETECTED NOT DETECTED Final    Comment: Performed at [facility], [place] [address], [place], [place] [address]  Culture, blood (routine x 2)     Status: Abnormal (Preliminary result)   Collection Time: 12/04/19  1:59 PM   Specimen: BLOOD  Result Value Ref Range Status   Specimen Description   Final    BLOOD RIGHT ANTECUBITAL Performed at [facility] [address] [place], [place] [address]    Special Requests   Final    BOTTLES DRAWN AEROBIC AND ANAEROBIC Blood Culture adequate volume Performed at [facility] [address], [place], [place] [address]    Culture  Setup Time   Final    GRAM POSITIVE COCCI IN CHAINS IN BOTH AEROBIC AND ANAEROBIC BOTTLES CRITICAL VALUE NOTED.  VALUE IS CONSISTENT WITH PREVIOUSLY REPORTED AND CALLED VALUE. Performed at [facility], [place] [address] [place], [place] [address]    Culture STREPTOCOCCUS MITIS/ORALIS (A)  Final   Report Status PENDING  Incomplete  Body fluid culture     Status: None (Preliminary result)   Collection Time: 12/05/19  4:09 PM   Specimen: Back; Body Fluid  Result Value Ref Range Status   Specimen Description   Final    BACK Performed at [facility] [address] [place], [place] [address]    Special Requests   Final    Normal Performed at [facility], [place] [address] [place], [place] [address]    Gram Stain   Final    RARE WBC PRESENT, PREDOMINANTLY PMN MODERATE GRAM POSITIVE COCCI    Culture   Final    FEW STREPTOCOCCUS MITIS/ORALIS SUSCEPTIBILITIES TO FOLLOW Performed at [facility], [place] [address], [place], [place] [address]    Report Status PENDING  Incomplete    Radiology Reports DG Chest 2 View  Result Date: 12/02/2019 CLINICAL DATA:  79 year old female with shortness of breath EXAM: CHEST - 2 VIEW COMPARISON:  Chest radiograph dated 12/12/2018. FINDINGS: There is no focal consolidation, pleural effusion, or pneumothorax. Mild diffuse interstitial coarsening. The cardiac silhouette is within normal limits. Median sternotomy wires, mechanical heart valve and left atrial appendage occlusive device. No acute osseous pathology. Cervical ACDF. IMPRESSION: No active cardiopulmonary disease. Electronically Signed   By: Anner Crete M.D.   On: 12/02/2019 18:36   DG Lumbar Spine 2-3 Views  Result Date: 12/01/2019 CLINICAL DATA:  Low back pain for 2 days.  No known injury. EXAM: LUMBAR SPINE - 2-3 VIEW COMPARISON:  None. FINDINGS: The patient has transitional lumbosacral anatomy with rudimentary ribs off what is presumed to be the first lumbar segment. No fracture or malalignment. Multilevel marked loss of disc space height appears worst at L1-2, L2-3 and L3-4. Extensive atherosclerosis is noted. IMPRESSION: No acute abnormality. Multilevel degenerative disease. Transitional lumbosacral anatomy. Atherosclerosis. Electronically Signed   By: Inge Rise M.D.   On: 12/01/2019 16:09   MR LUMBAR SPINE WO CONTRAST  Result Date: 12/04/2019 CLINICAL DATA:  Intractable low back pain since a twisting injury the night of 11/29/2019. Initial encounter. EXAM: MRI LUMBAR SPINE WITHOUT  CONTRAST TECHNIQUE: Multiplanar, multisequence MR imaging of the lumbar spine was performed. No intravenous contrast was administered. COMPARISON:  CT abdomen and pelvis 12/02/2019 and plain films lumbar spine 12/01/2019  FINDINGS: Segmentation: The patient has transitional lumbosacral anatomy. Numbering scheme on this examination is based on the lowest visible pair of ribs on the prior plain films and CT scan. Based on this numbering scheme, the L5 segment is sacralized and transitional. Alignment:  Maintained. Vertebrae: There is some fluid in the disc interspace at T12-L1 and loss of normal fat signal and paraspinous fat anterior to the disc interspace. Mild edema is present in the endplates. No fracture. Conus medullaris and cauda equina: Conus extends to the L1 level. Conus and cauda equina appear normal. Paraspinal and other soft tissues: See report of abdomen and pelvis CT scan. Disc levels: T10-11 and T11-12 are imaged in the sagittal plane only. There is mild disc bulging at both levels but no stenosis. T12-L1: There is a broad-based disc bulge or phlegmon effacing the ventral thecal sac. A very thin focus of epidural fluid is seen just superior to the disc interspace on sagittal image 8 of series 7. There is moderate central canal stenosis. Foramina are open. L1-2: Broad-based disc bulge causes mild to moderate central canal stenosis. Foramina are open. L2-3: Bilateral facet degenerative change, ligamentum flavum thickening and a shallow disc bulge. There is mild to moderate central canal stenosis. Foramina are open. L3-4: Facet degenerative disease, mild disc bulge and ligamentum flavum thickening. There is mild central canal stenosis. Foramina are open. L4-5: Central and eccentric to the right broad-based protrusion causes mild central canal stenosis and narrowing in the right subarticular recess. There is some facet degenerative change. Foramina are open. L5-S1: Transitional segment.  Negative.  IMPRESSION: Transitional lumbosacral anatomy. Please see numbering scheme above and correlate with plain films if any intervention is planned. Fluid in the disc interspace with endplate edema and infiltration of anterior paraspinous fat at T12-L1 could be due to degenerative change but is most suspicious for discitis and osteomyelitis. Likely ventral epidural phlegmon narrows the ventral thecal sac causing mild to moderate central canal stenosis at T12-L1. Moderate central canal stenosis L2-3. Mild central canal stenosis at L4-5 where there is also narrowing in the right subarticular recess which could impact the right L5 root. Electronically Signed   By: Inge Rise M.D.   On: 12/04/2019 12:06   CT ABDOMEN PELVIS W CONTRAST  Result Date: 12/02/2019 CLINICAL DATA:  Abdominal pain, back pain, concern for compression deformity versus acute aortic process EXAM: CT ABDOMEN AND PELVIS WITH CONTRAST TECHNIQUE: Multidetector CT imaging of the abdomen and pelvis was performed using the standard protocol following bolus administration of intravenous contrast. CONTRAST:  81m OMNIPAQUE IOHEXOL 300 MG/ML  SOLN COMPARISON:  Abdominal radiograph 10/24/2006 FINDINGS: Lower chest: Bandlike atelectasis in the lingula. Lung bases are otherwise clear. Normal heart size. No pericardial effusion. Coronary arteries are calcified. Hepatobiliary: No focal liver abnormality is seen. No gallstones, gallbladder wall thickening, or biliary dilatation. Pancreas: Unremarkable. No pancreatic ductal dilatation or surrounding inflammatory changes. Spleen: Normal in size without focal abnormality. Adrenals/Urinary Tract: Normal adrenal glands. Scattered subcentimeter hypoattenuating foci in both kidneys too small to fully characterize on CT imaging but statistically likely benign. No worrisome renal lesions. No urolithiasis or hydronephrosis. Normal urinary bladder. Stomach/Bowel: Small fluid-filled type 2 hiatal hernia. Distal stomach is  unremarkable. Duodenal sweep takes a normal course. No small bowel dilatation or wall thickening. A normal appendix is visualized. There is a slightly tortuous and redundant colon particularly the right hemicolon. Portion of the transverse colon protrudes into a large ventral hernia defect without evidence of resulting mechanical obstruction at this  time. Distal colon demonstrates some scattered colonic diverticula without diverticular inflammation. Vascular/Lymphatic: Atherosclerotic plaque within the normal caliber aorta. No acute aortic abnormalities clearly identified within the limitations of this non angiographic technique. No periaortic stranding or hemorrhage. No aneurysm or ectasia. No suspicious or enlarged lymph nodes in the included lymphatic chains. Reproductive: Uterus is surgically absent. No concerning adnexal lesions. Other: Posterior body wall edema. Large ventral hernia defect appears to protrude appears to protrude medial to the rectus sheath accounting for abdominal wall laxity and rightward displacement of the linea alba. Hernia sac contains portion of the transverse colon and omental fat with some stranding within the herniated fat contents. A smaller superimposed fat containing hernia seen just inferiorly as well., fascial defect measures approximately 3.6 by 4.9 cm transverse by craniocaudal. Few benign-appearing calcifications present within the herniated fat content. No free fluid or free air. Musculoskeletal: Ventral hernia, as above. Laxity of the anterior abdominal wall with atrophy of the rectus sheath and oblique musculature. No visible compression deformity or acute osseous injury is identified. The osseous structures appear diffusely demineralized which may limit detection of small or nondisplaced fractures. Transitional lumbosacral anatomy with lumbarization of the L5 vertebrae. IMPRESSION: 1. Aortic Atherosclerosis (ICD10-I70.0). No acute aortic abnormality. 2. The osseous  structures appear diffusely demineralized which may limit detection of small or nondisplaced fractures. No visible compression deformity or acute osseous injury is identified. 3. Large ventral hernia defect appears to protrude medial to the rectus sheath accounting for abdominal wall laxity and rightward displacement of the linea alba. Hernia sac contains portion of the transverse colon and omental fat with some stranding within the herniated fat contents which could reflect some fat strangulation. No evidence of resulting mechanical colonic obstruction at this time. 4. Small fluid-filled type 2 paraesophageal hiatal hernia. 5. Scattered colonic diverticula without diverticular inflammation. 6. Coronary arteries are calcified. These results were called by telephone at the time of interpretation on 12/02/2019 at 10:54 pm to provider [facility] , who verbally acknowledged these results. Electronically Signed   By: Lovena Le M.D.   On: 12/02/2019 22:54   IR Fluoro Guide Ndl Plmt / BX  Result Date: 12/05/2019 INDICATION: 79 year old female with T12-L1 discitis osteomyelitis. She presents for aspiration. EXAM: Disc aspiration MEDICATIONS: The patient is currently admitted to the hospital and receiving intravenous antibiotics. The antibiotics were administered within an appropriate time frame prior to the initiation of the procedure. ANESTHESIA/SEDATION: Fentanyl 100 mcg IV; Versed 2 mg IV Moderate Sedation Time:  10 minutes The patient was continuously monitored during the procedure by the interventional radiology nurse under my direct supervision. COMPLICATIONS: None immediate. PROCEDURE: Informed written consent was obtained from the patient after a thorough discussion of the procedural risks, benefits and alternatives. All questions were addressed. Maximal Sterile Barrier Technique was utilized including caps, mask, sterile gowns, sterile gloves, sterile drape, hand hygiene and skin antiseptic. A timeout was  performed prior to the initiation of the procedure. The T12-L1 disc space was localized. A suitable skin entry site was selected and marked. Local anesthesia was attained by infiltration with 1% lidocaine. From a right oblique posterolateral approach, a 22 gauge Chiba needle was carefully advanced into the disc space. The needle was advanced across the midline. Aspiration was then performed as the needle was withdrawn yielding approximately 1 mL of turbid serosanguineous fluid. This was sent for culture. The patient tolerated the procedure well. IMPRESSION: Technically successful T12-L1 disc aspiration yielding 1 mL bloody fluid which was sent for culture.  Electronically Signed   By: Jacqulynn Cadet M.D.   On: 12/05/2019 16:22   ECHOCARDIOGRAM COMPLETE  Result Date: 12/04/2019    ECHOCARDIOGRAM REPORT   Patient Name:   Doris [place] Date of Exam: 12/04/2019 Medical Rec #:  [id number]      Height:       63.0 in Accession #:    [id number]     Weight:       170.0 lb Date of Birth:  12/24/1940       BSA:          1.805 m Patient Age:    38 years       BP:           137/56 mmHg Patient Gender: F              HR:           78 bpm. Exam Location:  Inpatient Procedure: 2D Echo, Cardiac Doppler, Color Doppler and Intracardiac            Opacification Agent Indications:    Murmur 785.2  History:        Patient has prior history of Echocardiogram examinations, most                 recent 05/29/2019. Prior CABG, Pulmonary HTN; Risk                 Factors:Hypertension, Diabetes, Dyslipidemia and Non-Smoker.                 Aortic Valve: 21 mm valve is present in the aortic position.                 Procedure Date: 09/27/2018.  Sonographer:    Vickie Epley RDCS Referring Phys: [id number] ERIC J [place] IMPRESSIONS  1. Left ventricular ejection fraction, by estimation, is 60 to 65%. The left ventricle has normal function. The left ventricle has no regional wall motion abnormalities. Left ventricular diastolic parameters are  consistent with Grade II diastolic dysfunction (pseudonormalization). Elevated left ventricular end-diastolic pressure.  2. Right ventricular systolic function is mildly reduced. The right ventricular size is normal. There is moderately elevated pulmonary artery systolic pressure.  3. The mitral valve is normal in structure and function. Mild mitral valve regurgitation. No evidence of mitral stenosis.  4. The aortic valve is tricuspid. Aortic valve regurgitation is not visualized. Mild aortic valve stenosis. There is a 21 mm valve present in the aortic position. Procedure Date: 09/27/2018.  5. The inferior vena cava is normal in size with greater than 50% respiratory variability, suggesting right atrial pressure of 3 mmHg. FINDINGS  Left Ventricle: Left ventricular ejection fraction, by estimation, is 60 to 65%. The left ventricle has normal function. The left ventricle has no regional wall motion abnormalities. Definity contrast agent was given IV to delineate the left ventricular  endocardial borders. The left ventricular internal cavity size was normal in size. There is no left ventricular hypertrophy. Left ventricular diastolic parameters are consistent with Grade II diastolic dysfunction (pseudonormalization). Elevated left ventricular end-diastolic pressure. Right Ventricle: The right ventricular size is normal. No increase in right ventricular wall thickness. Right ventricular systolic function is mildly reduced. There is moderately elevated pulmonary artery systolic pressure. The tricuspid regurgitant velocity is 3.87 m/s, and with an assumed right atrial pressure of 3 mmHg, the estimated right ventricular systolic pressure is 64.4 mmHg. Left Atrium: Left atrial size was normal in size. Right Atrium: Right atrial size was normal  in size. Pericardium: There is no evidence of pericardial effusion. Mitral Valve: The mitral valve is normal in structure and function. There is moderate calcification of the  anterior mitral valve leaflet(s). Normal mobility of the mitral valve leaflets. Moderate mitral annular calcification. Mild mitral valve regurgitation. No evidence of mitral valve stenosis. Tricuspid Valve: The tricuspid valve is normal in structure. Tricuspid valve regurgitation is mild . No evidence of tricuspid stenosis. Aortic Valve: The aortic valve is tricuspid. . There is mild thickening and mild calcification of the aortic valve. Aortic valve regurgitation is not visualized. Mild aortic stenosis is present. Mild to moderate aortic valve annular calcification. There is mild thickening of the aortic valve. There is mild calcification of the aortic valve. Aortic valve mean gradient measures 13.0 mmHg. Aortic valve peak gradient measures 22.8 mmHg. There is a 21 mm valve present in the aortic position. Procedure Date: 09/27/2018. Pulmonic Valve: The pulmonic valve was normal in structure. Pulmonic valve regurgitation is not visualized. No evidence of pulmonic stenosis. Aorta: The aortic root is normal in size and structure. Venous: The inferior vena cava is normal in size with greater than 50% respiratory variability, suggesting right atrial pressure of 3 mmHg. IAS/Shunts: No atrial level shunt detected by color flow Doppler.  LEFT VENTRICLE PLAX 2D LVIDd:         4.10 cm Diastology LVIDs:         2.90 cm LV e' lateral:   6.83 cm/s LV PW:         0.90 cm LV E/e' lateral: 21.7 LV IVS:        0.90 cm LV e' medial:    4.91 cm/s                        LV E/e' medial:  30.1  RIGHT VENTRICLE RV S prime:     7.62 cm/s TAPSE (M-mode): 1.5 cm LEFT ATRIUM             Index       RIGHT ATRIUM          Index LA diam:        4.00 cm 2.22 cm/m  RA Area:     8.74 cm LA Vol (A2C):   41.9 ml 23.22 ml/m RA Volume:   13.90 ml 7.70 ml/m LA Vol (A4C):   39.7 ml 22.00 ml/m LA Biplane Vol: 46.2 ml 25.60 ml/m  AORTIC VALVE AV Vmax:           238.50 cm/s AV Vmean:          172.500 cm/s AV VTI:            0.488 m AV Peak Grad:       22.8 mmHg AV Mean Grad:      13.0 mmHg LVOT Vmax:         148.00 cm/s LVOT Vmean:        97.500 cm/s LVOT VTI:          0.295 m LVOT/AV VTI ratio: 0.60 MITRAL VALVE                TRICUSPID VALVE MV Area (PHT): 3.30 cm     TR Peak grad:   59.9 mmHg MV Decel Time: 230 msec     TR Vmax:        387.00 cm/s MV E velocity: 148.00 cm/s MV A velocity: 108.00 cm/s  SHUNTS MV E/A ratio:  1.37  Systemic VTI: 0.30 m Fransico Him MD Electronically signed by Fransico Him MD Signature Date/Time: 12/04/2019/3:58:31 PM    Final     Lab Data:  CBC: Recent Labs  Lab 12/02/19 2053 12/03/19 4259 12/04/19 0439 12/05/19 0420 12/06/19 0518  WBC 13.1* 13.9* 8.8 9.0 10.0  NEUTROABS  --   --   --  5.9  --   HGB 10.3* 10.1* 9.6* 9.9* 9.3*  HCT 33.4* 32.3* 30.6* 33.1* 31.3*  MCV 88.1 88.7 86.7 90.7 90.7  PLT 223 217 183 190 563   Basic Metabolic Panel: Recent Labs  Lab 12/02/19 2053 12/03/19 0514 12/04/19 0439 12/05/19 0420 12/06/19 0518  NA 140 142 143 145 144  K 3.8 3.9 3.4* 3.8 3.8  CL 106 110 115* 116* 114*  CO2 16* 16* 17* 21* 17*  GLUCOSE 210* 175* 170* 144* 124*  BUN 34* 31* 23 20 16   CREATININE 1.40* 1.29* 1.12* 1.04* 0.93  CALCIUM 9.1 8.8* 8.4* 8.9 8.4*   GFR: Estimated Creatinine Clearance: 49 mL/min (by C-G formula based on SCr of 0.93 mg/dL). Liver Function Tests: Recent Labs  Lab 12/02/19 2053  AST 21  ALT 16  ALKPHOS 82  BILITOT 1.2  PROT 6.9  ALBUMIN 3.2*   No results for input(s): LIPASE, AMYLASE in the last 168 hours. No results for input(s): AMMONIA in the last 168 hours. Coagulation Profile: Recent Labs  Lab 12/05/19 0420  INR 1.2   Cardiac Enzymes: No results for input(s): CKTOTAL, CKMB, CKMBINDEX, TROPONINI in the last 168 hours. BNP (last 3 results) No results for input(s): PROBNP in the last 8760 hours. HbA1C: No results for input(s): HGBA1C in the last 72 hours. CBG: Recent Labs  Lab 12/05/19 1133 12/05/19 1731 12/05/19 2125 12/06/19 0725  12/06/19 1151  GLUCAP 122* 112* 192* 110* 168*   Lipid Profile: No results for input(s): CHOL, HDL, LDLCALC, TRIG, CHOLHDL, LDLDIRECT in the last 72 hours. Thyroid Function Tests: No results for input(s): TSH, T4TOTAL, FREET4, T3FREE, THYROIDAB in the last 72 hours. Anemia Panel: No results for input(s): VITAMINB12, FOLATE, FERRITIN, TIBC, IRON, RETICCTPCT in the last 72 hours. Urine analysis:    Component Value Date/Time   COLORURINE YELLOW 09/25/2018 1932   APPEARANCEUR CLEAR 09/25/2018 1932   LABSPEC 1.027 09/25/2018 1932   PHURINE 5.0 09/25/2018 1932   GLUCOSEU >=500 (A) 09/25/2018 1932   HGBUR NEGATIVE 09/25/2018 1932   BILIRUBINUR NEGATIVE 09/25/2018 1932   KETONESUR NEGATIVE 09/25/2018 1932   PROTEINUR NEGATIVE 09/25/2018 1932   NITRITE NEGATIVE 09/25/2018 1932   LEUKOCYTESUR NEGATIVE 09/25/2018 1932     Kamori Kitchens M.D. [facility] 12/06/2019, 1:54 PM   Call night coverage person covering after 7pm

## 2019-12-07 DIAGNOSIS — M4640 Discitis, unspecified, site unspecified: Secondary | ICD-10-CM

## 2019-12-07 DIAGNOSIS — M4620 Osteomyelitis of vertebra, site unspecified: Secondary | ICD-10-CM

## 2019-12-07 DIAGNOSIS — Z1611 Resistance to penicillins: Secondary | ICD-10-CM

## 2019-12-07 DIAGNOSIS — B954 Other streptococcus as the cause of diseases classified elsewhere: Secondary | ICD-10-CM

## 2019-12-07 LAB — CBC
HCT: 30.6 % — ABNORMAL LOW (ref 36.0–46.0)
Hemoglobin: 9.3 g/dL — ABNORMAL LOW (ref 12.0–15.0)
MCH: 27 pg (ref 26.0–34.0)
MCHC: 30.4 g/dL (ref 30.0–36.0)
MCV: 89 fL (ref 80.0–100.0)
Platelets: 196 10*3/uL (ref 150–400)
RBC: 3.44 MIL/uL — ABNORMAL LOW (ref 3.87–5.11)
RDW: 14.3 % (ref 11.5–15.5)
WBC: 7.8 10*3/uL (ref 4.0–10.5)
nRBC: 0 % (ref 0.0–0.2)

## 2019-12-07 LAB — CULTURE, BLOOD (ROUTINE X 2)
Special Requests: ADEQUATE
Special Requests: ADEQUATE

## 2019-12-07 LAB — BODY FLUID CULTURE: Special Requests: NORMAL

## 2019-12-07 LAB — GLUCOSE, CAPILLARY
Glucose-Capillary: 130 mg/dL — ABNORMAL HIGH (ref 70–99)
Glucose-Capillary: 198 mg/dL — ABNORMAL HIGH (ref 70–99)
Glucose-Capillary: 169 mg/dL — ABNORMAL HIGH (ref 70–99)
Glucose-Capillary: 155 mg/dL — ABNORMAL HIGH (ref 70–99)

## 2019-12-07 LAB — BASIC METABOLIC PANEL
Anion gap: 6 (ref 5–15)
BUN: 14 mg/dL (ref 8–23)
CO2: 18 mmol/L — ABNORMAL LOW (ref 22–32)
Calcium: 8.2 mg/dL — ABNORMAL LOW (ref 8.9–10.3)
Chloride: 115 mmol/L — ABNORMAL HIGH (ref 98–111)
Creatinine, Ser: 0.85 mg/dL (ref 0.44–1.00)
GFR calc Af Amer: >60 mL/min (ref >60)
GFR calc non Af Amer: >60 mL/min (ref >60)
Glucose, Bld: 124 mg/dL — ABNORMAL HIGH (ref 70–99)
Potassium: 3.2 mmol/L — ABNORMAL LOW (ref 3.5–5.1)
Sodium: 139 mmol/L (ref 135–145)

## 2019-12-07 MED ORDER — POLYETHYLENE GLYCOL 3350 17 G PO PACK
17.0000 g | PACK | Freq: Every day | ORAL | Status: DC
  Administered 2019-12-07 – 2019-12-08 (×2): 17 g via ORAL
  Filled 2019-12-07 (×3): qty 1

## 2019-12-07 MED ORDER — POTASSIUM CHLORIDE CRYS ER 20 MEQ PO TBCR
40.0000 meq | EXTENDED_RELEASE_TABLET | Freq: Once | ORAL | Status: AC
  Administered 2019-12-07: 40 meq via ORAL
  Filled 2019-12-07: qty 2

## 2019-12-07 MED ORDER — HYDROMORPHONE HCL 1 MG/ML IJ SOLN: 1.0000 mg | INTRAMUSCULAR | Status: DC | PRN

## 2019-12-07 MED ORDER — HYDROMORPHONE HCL 1 MG/ML IJ SOLN
1.0000 mg | INTRAMUSCULAR | Status: DC | PRN
  Administered 2019-12-07 (×2): 1 mg via INTRAVENOUS
  Filled 2019-12-07 (×2): qty 1

## 2019-12-07 MED ORDER — OXYCODONE HCL 5 MG PO TABS
10.0000 mg | ORAL_TABLET | ORAL | Status: DC | PRN
  Administered 2019-12-07 (×2): 10 mg via ORAL
  Filled 2019-12-07 (×2): qty 2

## 2019-12-07 NOTE — Progress Notes (Signed)
[facility]                                                                              Patient Demographics  Doris Jefferson, is a 79 y.o. female, DOB - [birth year], UEA:[id number]  Admit date - 12/02/2019   Admitting Physician Eric J [place] DO  Outpatient Primary MD for the patient is Aletha Halim., PA-C  Outpatient specialists:   LOS - 4  days   Medical records reviewed and are as summarized below:    Chief Complaint  Patient presents with  . Shortness of Breath       Brief summary   Doris Jefferson is a 79 year old Caucasian female with past medical history remarkable for essential hypertension, hyperlipidemia, type 2 diabetes mellitus, CAD status post CABG, obesity/OSA, depression, GERD, paroxysmal atrial fibrillation, iron deficiency anemia who presented to the ED with intractable back pain.  She was in her usual state of health until Saturday night when she turned awkwardly in her bed trying to get out and use the bathroom and had sudden onset severe back pain.  She reports it is localized to the low back, described as a bandlike sensation that is sharp.  No recent injuries or falls.   Hospital course so far MRI of the L-spine showed fluid to disc interspace with endplate edema, infiltration of anterior paraspinous fat T12-L1 suspicious for discitis and osteomyelitis, questionable phlegmon versus disc bulge at T12-L1 Status post IR biopsy, aspiration, started on IV Rocephin Blood cultures+ Streptococcus mitis/ oralis ID following.  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  Phlegmon versus broad-based disc bulge at T12-L1 Streptococcus bacteremia -Patient presented to ED with acute onset low back pain.  CT abdomen pelvis with no acute musculoskeletal findings.  MRI of L-spine showed T12-L1 discitis versus osteomyelitis, ?  Phlegmon versus disc bulge at the T12-L1 level -ESR 64,  CRP 15.0 -Dr. [place] discussed with neurosurgery, Dr. Joaquim Lai, difficult to discern given lack of contrast, recommended to proceed with IR biopsy/aspiration - 2D echo showed EF of 60 to 47%, grade 2 diastolic dysfunction, mildly reduced RV systolic function, mild MR, mild aortic valve stenosis, normal IVC, no obvious vegetations - Status post IR biopsy/aspiration, cultures positive for strep mitis/oralis - blood cultures positive for Streptococcus mitis/oralis, resistant to penicillin, sensitive to ceftriaxone -  Repeat blood cultures so far negative, if remains negative in a.m., will place PICC line - Appreciate ID recommendations, continue ceftriaxone 8 weeks through 01/29/2020 - Increased oxycodone today, in severe pain and uncomfortable at the time of my examination  Leukocytosis -- Resolved, likely due to #1  CAD s/p CABG Paroxysmal Atrial Fibrillation s/p LAA ligation s/p Bioprosthetic AVR -Continue aspirin, Coreg, rosuvastatin, no acute issues  Pulmonary HTN -Continue sildenafil  OSA --Continue CPAP  CKD stage 3b Baseline creatinine between 1.1-1.5,  --Cr 1.40 at the time of admission, improved to 0.9 today  Diabetes mellitus type 2, NIDDM, controlled -Hold outpatient regimen, continue sliding scale insulin while inpatient  Essential HTN --BP stable, continue Coreg  HLD --Continue pravastatin  Depression -- Continue Wellbutrin  Code Status: Full CODE STATUS DVT Prophylaxis: SCDs Family Communication: Discussed all imaging results, lab results, explained to the patient and husband on the phone   Disposition Plan: Patient from home.  Anticipated discharge home in 24-48 hrs, needs PICC line. Blood cultures, cultures from the IR biopsy growing Streptococcus, on IV rocephin.  Currently in significant amount of pain, requiring IV pain medications. All the above barriers precludes safe discharge at this time  Time Spent in minutes25 minutes  Procedures:  IR  biopsy/aspiration  Consultants:    Interventional radiology  Infectious disease - Dr. Johnnye Sima  Neurosurgery - Dr. Vertell Limber, case discussed on 12/04/2019  Antimicrobials:   Anti-infectives (From admission, onward)   Start     Dose/Rate Route Frequency Ordered Stop   12/05/19 1600  cefTRIAXone (ROCEPHIN) 2 g in sodium chloride 0.9 % 100 mL IVPB     2 g 200 mL/hr over 30 Minutes Intravenous Daily 12/05/19 1537           Medications  Scheduled Meds: . aspirin EC  81 mg Oral Daily  . buPROPion  300 mg Oral Daily  . carvedilol  12.5 mg Oral BID WC  . insulin aspart  0-5 Units Subcutaneous QHS  . insulin aspart  0-9 Units Subcutaneous TID WC  . loratadine  10 mg Oral Daily  . methocarbamol  1,000 mg Oral Q8H  . polyethylene glycol  17 g Oral Daily  . rosuvastatin  20 mg Oral Daily  . senna-docusate  2 tablet Oral BID  . sildenafil  20 mg Oral TID  . sodium chloride flush  3 mL Intravenous Q12H   Continuous Infusions: . cefTRIAXone (ROCEPHIN)  IV 2 g (12/06/19 1446)   PRN Meds:.acetaminophen **OR** acetaminophen, acetaminophen, HYDROmorphone (DILAUDID) injection, loperamide, LORazepam, oxyCODONE      Subjective:   Doris Jefferson was seen and examined today.  At the time of my examination, in pain 8/10 on minimal moving, no fevers or chills.  Status post IR aspiration/biopsy.  No focal neurological deficits. Patient denies dizziness, chest pain, shortness of breath, abdominal pain, N/V, new weakness, numbess, tingling.  No acute events overnight.  Objective:   Vitals:   12/06/19 0527 12/06/19 1348 12/06/19 2052 12/07/19 0545  BP: (!) 141/66 (!) 130/53 (!) 155/64 (!) 155/68  Pulse: 84 81 82 91  Resp: 18 16 16 16   Temp: 98.9 F (37.2 C) 98.9 F (37.2 C) 98.6 F (37 C) 98.6 F (37 C)  TempSrc: Oral  Oral Oral  SpO2: 98% 99% 100% 97%  Weight:      Height:        Intake/Output Summary (Last 24 hours) at 12/07/2019 1304 Last data filed at 12/06/2019 1745 Gross per 24  hour  Intake 960 ml  Output --  Net 960 ml     Wt Readings from Last 3 Encounters:  12/02/19 77.1 kg  11/29/19 77.1 kg  11/06/19 78.5 kg   Physical Exam  General: Alert and oriented x 3, uncomfortable  Cardiovascular: S1 S2 clear, RRR. No pedal edema b/l  Respiratory: CTAB, no wheezing, rales or rhonchi  Gastrointestinal: Soft, nontender, nondistended, NBS  Ext: no pedal edema bilaterally  Neuro: Able to lift up lower extremities but in severe pain in her back  Musculoskeletal: No cyanosis, clubbing  Skin: No rashes  Psych: Normal affect and demeanor, alert and oriented x3    Data Reviewed:  I have personally reviewed following labs and imaging studies  Micro Results Recent Results (from the past 240  hour(s))  SARS CORONAVIRUS 2 (TAT 6-24 HRS) Nasopharyngeal Nasopharyngeal Swab     Status: None   Collection Time: 12/03/19 12:44 AM   Specimen: Nasopharyngeal Swab  Result Value Ref Range Status   SARS Coronavirus 2 NEGATIVE NEGATIVE Final    Comment: (NOTE) SARS-CoV-2 target nucleic acids are NOT DETECTED. The SARS-CoV-2 RNA is generally detectable in upper and lower respiratory specimens during the acute phase of infection. Negative results do not preclude SARS-CoV-2 infection, do not rule out co-infections with other pathogens, and should not be used as the sole basis for treatment or other patient management decisions. Negative results must be combined with clinical observations, patient history, and epidemiological information. The expected result is Negative. Fact Sheet for Patients: SugarRoll.be Fact Sheet for Healthcare Providers: https://www.woods-mathews.com/ This test is not yet approved or cleared by the [place] FDA and  has been authorized for detection and/or diagnosis of SARS-CoV-2 by FDA under an Emergency Use Authorization (EUA). This EUA will remain  in effect (meaning this test can be used) for  the duration of the COVID-19 declaration under Section 56 4(b)(1) of the Act, 21 U.S.C. section 360bbb-3(b)(1), unless the authorization is terminated or revoked sooner. Performed at [facility], [place] [address] [place], [place] [address]   Culture, blood (routine x 2)     Status: Abnormal   Collection Time: 12/04/19  1:46 PM   Specimen: BLOOD  Result Value Ref Range Status   Specimen Description   Final    BLOOD LEFT ANTECUBITAL Performed at [facility] [address] [place], [place] [address]    Special Requests   Final    BOTTLES DRAWN AEROBIC AND ANAEROBIC Blood Culture adequate volume Performed at [facility] [address], [place], [place] [address]    Culture  Setup Time   Final    GRAM POSITIVE COCCI IN CHAINS IN BOTH AEROBIC AND ANAEROBIC BOTTLES CRITICAL RESULT CALLED TO, READ BACK BY AND VERIFIED WITH: Guadlupe Spanish PharmD 12:20 12/05/19 ([email or web address]) Performed at [facility], [place] [address], [place], [place] [address]    Culture STREPTOCOCCUS MITIS/ORALIS (A)  Final   Report Status 12/07/2019 FINAL  Final   Organism ID, Bacteria STREPTOCOCCUS MITIS/ORALIS  Final      Susceptibility   Streptococcus mitis/oralis - MIC*    TETRACYCLINE >=16 RESISTANT Resistant     VANCOMYCIN 0.5 SENSITIVE Sensitive     CLINDAMYCIN >=1 RESISTANT Resistant     PENICILLIN Value in next row Intermediate      INTERMEDIATE0.5    CEFTRIAXONE Value in next row Sensitive      SENSITIVE0.5    * STREPTOCOCCUS MITIS/ORALIS  Blood Culture ID Panel (Reflexed)     Status: Abnormal   Collection Time: 12/04/19  1:46 PM  Result Value Ref Range Status   Enterococcus species NOT DETECTED NOT DETECTED Final   Listeria monocytogenes NOT DETECTED NOT DETECTED Final   Staphylococcus species NOT DETECTED NOT DETECTED Final   Staphylococcus aureus (BCID) NOT DETECTED NOT DETECTED Final   Streptococcus species DETECTED (A) NOT DETECTED Final    Comment:  Not Enterococcus species, Streptococcus agalactiae, Streptococcus pyogenes, or Streptococcus pneumoniae. CRITICAL RESULT CALLED TO, READ BACK BY AND VERIFIED WITH: Guadlupe Spanish PharmD 12:20 12/05/19 ([email or web address])    Streptococcus agalactiae NOT DETECTED NOT DETECTED Final   Streptococcus pneumoniae NOT DETECTED NOT DETECTED Final   Streptococcus pyogenes NOT DETECTED NOT DETECTED Final   Acinetobacter baumannii NOT DETECTED NOT DETECTED Final  Enterobacteriaceae species NOT DETECTED NOT DETECTED Final   Enterobacter cloacae complex NOT DETECTED NOT DETECTED Final   Escherichia coli NOT DETECTED NOT DETECTED Final   Klebsiella oxytoca NOT DETECTED NOT DETECTED Final   Klebsiella pneumoniae NOT DETECTED NOT DETECTED Final   Proteus species NOT DETECTED NOT DETECTED Final   Serratia marcescens NOT DETECTED NOT DETECTED Final   Haemophilus influenzae NOT DETECTED NOT DETECTED Final   Neisseria meningitidis NOT DETECTED NOT DETECTED Final   Pseudomonas aeruginosa NOT DETECTED NOT DETECTED Final   Candida albicans NOT DETECTED NOT DETECTED Final   Candida glabrata NOT DETECTED NOT DETECTED Final   Candida krusei NOT DETECTED NOT DETECTED Final   Candida parapsilosis NOT DETECTED NOT DETECTED Final   Candida tropicalis NOT DETECTED NOT DETECTED Final    Comment: Performed at [facility], [place] [address], [place], [place] [address]  Culture, blood (routine x 2)     Status: Abnormal   Collection Time: 12/04/19  1:59 PM   Specimen: BLOOD  Result Value Ref Range Status   Specimen Description   Final    BLOOD RIGHT ANTECUBITAL Performed at [facility] [address], [place], [place] [address]    Special Requests   Final    BOTTLES DRAWN AEROBIC AND ANAEROBIC Blood Culture adequate volume Performed at [facility] [address], [place], [place] [address]    Culture  Setup Time   Final    GRAM POSITIVE COCCI IN CHAINS IN BOTH AEROBIC AND ANAEROBIC  BOTTLES CRITICAL VALUE NOTED.  VALUE IS CONSISTENT WITH PREVIOUSLY REPORTED AND CALLED VALUE.    Culture (A)  Final    STREPTOCOCCUS MITIS/ORALIS SUSCEPTIBILITIES PERFORMED ON PREVIOUS CULTURE WITHIN THE LAST 5 DAYS. Performed at [facility], [place] [address] [place], [place] [address]    Report Status 12/07/2019 FINAL  Final  Body fluid culture     Status: None   Collection Time: 12/05/19  4:09 PM   Specimen: Back; Body Fluid  Result Value Ref Range Status   Specimen Description   Final    BACK Performed at [facility] [address] [place], [place] [address]    Special Requests   Final    Normal Performed at [facility], [place] [address] [place], [place] [address]    Gram Stain   Final    RARE WBC PRESENT, PREDOMINANTLY PMN MODERATE GRAM POSITIVE COCCI Performed at [facility], [place] [address] [place], [place] [address]    Culture FEW STREPTOCOCCUS MITIS/ORALIS  Final   Report Status 12/07/2019 FINAL  Final   Organism ID, Bacteria STREPTOCOCCUS MITIS/ORALIS  Final      Susceptibility   Streptococcus mitis/oralis - MIC*    TETRACYCLINE >=16 RESISTANT Resistant     VANCOMYCIN 0.5 SENSITIVE Sensitive     CLINDAMYCIN >=1 RESISTANT Resistant     PENICILLIN Value in next row Intermediate      INTERMEDIATE0.5    CEFTRIAXONE Value in next row Sensitive      SENSITIVE1    * FEW STREPTOCOCCUS MITIS/ORALIS  Culture, blood (Routine X 2) w Reflex to ID Panel     Status: None (Preliminary result)   Collection Time: 12/06/19  5:18 AM   Specimen: BLOOD  Result Value Ref Range Status   Specimen Description   Final    BLOOD LEFT ANTECUBITAL Performed at [facility], [address], [place], [place] [address]  Special Requests   Final    BOTTLES DRAWN AEROBIC ONLY Blood Culture adequate volume Performed at [facility], [place] [place], [place] [address]    Culture   Final    NO GROWTH 1  DAY Performed at [facility] [address], [place], [place] [address]    Report Status PENDING  Incomplete    Radiology Reports DG Chest 2 View  Result Date: 12/02/2019 CLINICAL DATA:  79 year old female with shortness of breath EXAM: CHEST - 2 VIEW COMPARISON:  Chest radiograph dated 12/12/2018. FINDINGS: There is no focal consolidation, pleural effusion, or pneumothorax. Mild diffuse interstitial coarsening. The cardiac silhouette is within normal limits. Median sternotomy wires, mechanical heart valve and left atrial appendage occlusive device. No acute osseous pathology. Cervical ACDF. IMPRESSION: No active cardiopulmonary disease. Electronically Signed   By: Anner Crete M.D.   On: 12/02/2019 18:36   DG Lumbar Spine 2-3 Views  Result Date: 12/01/2019 CLINICAL DATA:  Low back pain for 2 days.  No known injury. EXAM: LUMBAR SPINE - 2-3 VIEW COMPARISON:  None. FINDINGS: The patient has transitional lumbosacral anatomy with rudimentary ribs off what is presumed to be the first lumbar segment. No fracture or malalignment. Multilevel marked loss of disc space height appears worst at L1-2, L2-3 and L3-4. Extensive atherosclerosis is noted. IMPRESSION: No acute abnormality. Multilevel degenerative disease. Transitional lumbosacral anatomy. Atherosclerosis. Electronically Signed   By: Inge Rise M.D.   On: 12/01/2019 16:09   MR LUMBAR SPINE WO CONTRAST  Result Date: 12/04/2019 CLINICAL DATA:  Intractable low back pain since a twisting injury the night of 11/29/2019. Initial encounter. EXAM: MRI LUMBAR SPINE WITHOUT CONTRAST TECHNIQUE: Multiplanar, multisequence MR imaging of the lumbar spine was performed. No intravenous contrast was administered. COMPARISON:  CT abdomen and pelvis 12/02/2019 and plain films lumbar spine 12/01/2019 FINDINGS: Segmentation: The patient has transitional lumbosacral anatomy. Numbering scheme on this examination is based on the lowest visible pair of ribs  on the prior plain films and CT scan. Based on this numbering scheme, the L5 segment is sacralized and transitional. Alignment:  Maintained. Vertebrae: There is some fluid in the disc interspace at T12-L1 and loss of normal fat signal and paraspinous fat anterior to the disc interspace. Mild edema is present in the endplates. No fracture. Conus medullaris and cauda equina: Conus extends to the L1 level. Conus and cauda equina appear normal. Paraspinal and other soft tissues: See report of abdomen and pelvis CT scan. Disc levels: T10-11 and T11-12 are imaged in the sagittal plane only. There is mild disc bulging at both levels but no stenosis. T12-L1: There is a broad-based disc bulge or phlegmon effacing the ventral thecal sac. A very thin focus of epidural fluid is seen just superior to the disc interspace on sagittal image 8 of series 7. There is moderate central canal stenosis. Foramina are open. L1-2: Broad-based disc bulge causes mild to moderate central canal stenosis. Foramina are open. L2-3: Bilateral facet degenerative change, ligamentum flavum thickening and a shallow disc bulge. There is mild to moderate central canal stenosis. Foramina are open. L3-4: Facet degenerative disease, mild disc bulge and ligamentum flavum thickening. There is mild central canal stenosis. Foramina are open. L4-5: Central and eccentric to the right broad-based protrusion causes mild central canal stenosis and narrowing in the right subarticular recess. There is some facet degenerative change. Foramina are open. L5-S1: Transitional segment.  Negative. IMPRESSION: Transitional lumbosacral anatomy. Please see numbering scheme above  and correlate with plain films if any intervention is planned. Fluid in the disc interspace with endplate edema and infiltration of anterior paraspinous fat at T12-L1 could be due to degenerative change but is most suspicious for discitis and osteomyelitis. Likely ventral epidural phlegmon narrows the  ventral thecal sac causing mild to moderate central canal stenosis at T12-L1. Moderate central canal stenosis L2-3. Mild central canal stenosis at L4-5 where there is also narrowing in the right subarticular recess which could impact the right L5 root. Electronically Signed   By: Inge Rise M.D.   On: 12/04/2019 12:06   CT ABDOMEN PELVIS W CONTRAST  Result Date: 12/02/2019 CLINICAL DATA:  Abdominal pain, back pain, concern for compression deformity versus acute aortic process EXAM: CT ABDOMEN AND PELVIS WITH CONTRAST TECHNIQUE: Multidetector CT imaging of the abdomen and pelvis was performed using the standard protocol following bolus administration of intravenous contrast. CONTRAST:  6m OMNIPAQUE IOHEXOL 300 MG/ML  SOLN COMPARISON:  Abdominal radiograph 10/24/2006 FINDINGS: Lower chest: Bandlike atelectasis in the lingula. Lung bases are otherwise clear. Normal heart size. No pericardial effusion. Coronary arteries are calcified. Hepatobiliary: No focal liver abnormality is seen. No gallstones, gallbladder wall thickening, or biliary dilatation. Pancreas: Unremarkable. No pancreatic ductal dilatation or surrounding inflammatory changes. Spleen: Normal in size without focal abnormality. Adrenals/Urinary Tract: Normal adrenal glands. Scattered subcentimeter hypoattenuating foci in both kidneys too small to fully characterize on CT imaging but statistically likely benign. No worrisome renal lesions. No urolithiasis or hydronephrosis. Normal urinary bladder. Stomach/Bowel: Small fluid-filled type 2 hiatal hernia. Distal stomach is unremarkable. Duodenal sweep takes a normal course. No small bowel dilatation or wall thickening. A normal appendix is visualized. There is a slightly tortuous and redundant colon particularly the right hemicolon. Portion of the transverse colon protrudes into a large ventral hernia defect without evidence of resulting mechanical obstruction at this time. Distal colon demonstrates  some scattered colonic diverticula without diverticular inflammation. Vascular/Lymphatic: Atherosclerotic plaque within the normal caliber aorta. No acute aortic abnormalities clearly identified within the limitations of this non angiographic technique. No periaortic stranding or hemorrhage. No aneurysm or ectasia. No suspicious or enlarged lymph nodes in the included lymphatic chains. Reproductive: Uterus is surgically absent. No concerning adnexal lesions. Other: Posterior body wall edema. Large ventral hernia defect appears to protrude appears to protrude medial to the rectus sheath accounting for abdominal wall laxity and rightward displacement of the linea alba. Hernia sac contains portion of the transverse colon and omental fat with some stranding within the herniated fat contents. A smaller superimposed fat containing hernia seen just inferiorly as well., fascial defect measures approximately 3.6 by 4.9 cm transverse by craniocaudal. Few benign-appearing calcifications present within the herniated fat content. No free fluid or free air. Musculoskeletal: Ventral hernia, as above. Laxity of the anterior abdominal wall with atrophy of the rectus sheath and oblique musculature. No visible compression deformity or acute osseous injury is identified. The osseous structures appear diffusely demineralized which may limit detection of small or nondisplaced fractures. Transitional lumbosacral anatomy with lumbarization of the L5 vertebrae. IMPRESSION: 1. Aortic Atherosclerosis (ICD10-I70.0). No acute aortic abnormality. 2. The osseous structures appear diffusely demineralized which may limit detection of small or nondisplaced fractures. No visible compression deformity or acute osseous injury is identified. 3. Large ventral hernia defect appears to protrude medial to the rectus sheath accounting for abdominal wall laxity and rightward displacement of the linea alba. Hernia sac contains portion of the transverse colon  and omental fat with some stranding  within the herniated fat contents which could reflect some fat strangulation. No evidence of resulting mechanical colonic obstruction at this time. 4. Small fluid-filled type 2 paraesophageal hiatal hernia. 5. Scattered colonic diverticula without diverticular inflammation. 6. Coronary arteries are calcified. These results were called by telephone at the time of interpretation on 12/02/2019 at 10:54 pm to provider [facility] , who verbally acknowledged these results. Electronically Signed   By: Lovena Le M.D.   On: 12/02/2019 22:54   IR Fluoro Guide Ndl Plmt / BX  Result Date: 12/05/2019 INDICATION: 79 year old female with T12-L1 discitis osteomyelitis. She presents for aspiration. EXAM: Disc aspiration MEDICATIONS: The patient is currently admitted to the hospital and receiving intravenous antibiotics. The antibiotics were administered within an appropriate time frame prior to the initiation of the procedure. ANESTHESIA/SEDATION: Fentanyl 100 mcg IV; Versed 2 mg IV Moderate Sedation Time:  10 minutes The patient was continuously monitored during the procedure by the interventional radiology nurse under my direct supervision. COMPLICATIONS: None immediate. PROCEDURE: Informed written consent was obtained from the patient after a thorough discussion of the procedural risks, benefits and alternatives. All questions were addressed. Maximal Sterile Barrier Technique was utilized including caps, mask, sterile gowns, sterile gloves, sterile drape, hand hygiene and skin antiseptic. A timeout was performed prior to the initiation of the procedure. The T12-L1 disc space was localized. A suitable skin entry site was selected and marked. Local anesthesia was attained by infiltration with 1% lidocaine. From a right oblique posterolateral approach, a 22 gauge Chiba needle was carefully advanced into the disc space. The needle was advanced across the midline. Aspiration was then  performed as the needle was withdrawn yielding approximately 1 mL of turbid serosanguineous fluid. This was sent for culture. The patient tolerated the procedure well. IMPRESSION: Technically successful T12-L1 disc aspiration yielding 1 mL bloody fluid which was sent for culture. Electronically Signed   By: Jacqulynn Cadet M.D.   On: 12/05/2019 16:22   ECHOCARDIOGRAM COMPLETE  Result Date: 12/04/2019    ECHOCARDIOGRAM REPORT   Patient Name:   Doris Jefferson Date of Exam: 12/04/2019 Medical Rec #:  [id number]      Height:       63.0 in Accession #:    [id number]     Weight:       170.0 lb Date of Birth:  07/17/1941       BSA:          1.805 m Patient Age:    37 years       BP:           137/56 mmHg Patient Gender: F              HR:           78 bpm. Exam Location:  Inpatient Procedure: 2D Echo, Cardiac Doppler, Color Doppler and Intracardiac            Opacification Agent Indications:    Murmur 785.2  History:        Patient has prior history of Echocardiogram examinations, most                 recent 05/29/2019. Prior CABG, Pulmonary HTN; Risk                 Factors:Hypertension, Diabetes, Dyslipidemia and Non-Smoker.                 Aortic Valve: 21 mm valve is present in the aortic position.  Procedure Date: 09/27/2018.  Sonographer:    Vickie Epley RDCS Referring Phys: [id number] ERIC J [place] IMPRESSIONS  1. Left ventricular ejection fraction, by estimation, is 60 to 65%. The left ventricle has normal function. The left ventricle has no regional wall motion abnormalities. Left ventricular diastolic parameters are consistent with Grade II diastolic dysfunction (pseudonormalization). Elevated left ventricular end-diastolic pressure.  2. Right ventricular systolic function is mildly reduced. The right ventricular size is normal. There is moderately elevated pulmonary artery systolic pressure.  3. The mitral valve is normal in structure and function. Mild mitral valve regurgitation. No evidence of  mitral stenosis.  4. The aortic valve is tricuspid. Aortic valve regurgitation is not visualized. Mild aortic valve stenosis. There is a 21 mm valve present in the aortic position. Procedure Date: 09/27/2018.  5. The inferior vena cava is normal in size with greater than 50% respiratory variability, suggesting right atrial pressure of 3 mmHg. FINDINGS  Left Ventricle: Left ventricular ejection fraction, by estimation, is 60 to 65%. The left ventricle has normal function. The left ventricle has no regional wall motion abnormalities. Definity contrast agent was given IV to delineate the left ventricular  endocardial borders. The left ventricular internal cavity size was normal in size. There is no left ventricular hypertrophy. Left ventricular diastolic parameters are consistent with Grade II diastolic dysfunction (pseudonormalization). Elevated left ventricular end-diastolic pressure. Right Ventricle: The right ventricular size is normal. No increase in right ventricular wall thickness. Right ventricular systolic function is mildly reduced. There is moderately elevated pulmonary artery systolic pressure. The tricuspid regurgitant velocity is 3.87 m/s, and with an assumed right atrial pressure of 3 mmHg, the estimated right ventricular systolic pressure is 40.8 mmHg. Left Atrium: Left atrial size was normal in size. Right Atrium: Right atrial size was normal in size. Pericardium: There is no evidence of pericardial effusion. Mitral Valve: The mitral valve is normal in structure and function. There is moderate calcification of the anterior mitral valve leaflet(s). Normal mobility of the mitral valve leaflets. Moderate mitral annular calcification. Mild mitral valve regurgitation. No evidence of mitral valve stenosis. Tricuspid Valve: The tricuspid valve is normal in structure. Tricuspid valve regurgitation is mild . No evidence of tricuspid stenosis. Aortic Valve: The aortic valve is tricuspid. . There is mild  thickening and mild calcification of the aortic valve. Aortic valve regurgitation is not visualized. Mild aortic stenosis is present. Mild to moderate aortic valve annular calcification. There is mild thickening of the aortic valve. There is mild calcification of the aortic valve. Aortic valve mean gradient measures 13.0 mmHg. Aortic valve peak gradient measures 22.8 mmHg. There is a 21 mm valve present in the aortic position. Procedure Date: 09/27/2018. Pulmonic Valve: The pulmonic valve was normal in structure. Pulmonic valve regurgitation is not visualized. No evidence of pulmonic stenosis. Aorta: The aortic root is normal in size and structure. Venous: The inferior vena cava is normal in size with greater than 50% respiratory variability, suggesting right atrial pressure of 3 mmHg. IAS/Shunts: No atrial level shunt detected by color flow Doppler.  LEFT VENTRICLE PLAX 2D LVIDd:         4.10 cm Diastology LVIDs:         2.90 cm LV e' lateral:   6.83 cm/s LV PW:         0.90 cm LV E/e' lateral: 21.7 LV IVS:        0.90 cm LV e' medial:    4.91 cm/s  LV E/e' medial:  30.1  RIGHT VENTRICLE RV S prime:     7.62 cm/s TAPSE (M-mode): 1.5 cm LEFT ATRIUM             Index       RIGHT ATRIUM          Index LA diam:        4.00 cm 2.22 cm/m  RA Area:     8.74 cm LA Vol (A2C):   41.9 ml 23.22 ml/m RA Volume:   13.90 ml 7.70 ml/m LA Vol (A4C):   39.7 ml 22.00 ml/m LA Biplane Vol: 46.2 ml 25.60 ml/m  AORTIC VALVE AV Vmax:           238.50 cm/s AV Vmean:          172.500 cm/s AV VTI:            0.488 m AV Peak Grad:      22.8 mmHg AV Mean Grad:      13.0 mmHg LVOT Vmax:         148.00 cm/s LVOT Vmean:        97.500 cm/s LVOT VTI:          0.295 m LVOT/AV VTI ratio: 0.60 MITRAL VALVE                TRICUSPID VALVE MV Area (PHT): 3.30 cm     TR Peak grad:   59.9 mmHg MV Decel Time: 230 msec     TR Vmax:        387.00 cm/s MV E velocity: 148.00 cm/s MV A velocity: 108.00 cm/s  SHUNTS MV E/A ratio:   1.37         Systemic VTI: 0.30 m Fransico Him MD Electronically signed by Fransico Him MD Signature Date/Time: 12/04/2019/3:58:31 PM    Final     Lab Data:  CBC: Recent Labs  Lab 12/03/19 0514 12/04/19 0439 12/05/19 0420 12/06/19 0518 12/07/19 0528  WBC 13.9* 8.8 9.0 10.0 7.8  NEUTROABS  --   --  5.9  --   --   HGB 10.1* 9.6* 9.9* 9.3* 9.3*  HCT 32.3* 30.6* 33.1* 31.3* 30.6*  MCV 88.7 86.7 90.7 90.7 89.0  PLT 217 183 190 198 929   Basic Metabolic Panel: Recent Labs  Lab 12/03/19 0514 12/04/19 0439 12/05/19 0420 12/06/19 0518 12/07/19 0528  NA 142 143 145 144 139  K 3.9 3.4* 3.8 3.8 3.2*  CL 110 115* 116* 114* 115*  CO2 16* 17* 21* 17* 18*  GLUCOSE 175* 170* 144* 124* 124*  BUN 31* 23 20 16 14   CREATININE 1.29* 1.12* 1.04* 0.93 0.85  CALCIUM 8.8* 8.4* 8.9 8.4* 8.2*   GFR: Estimated Creatinine Clearance: 53.6 mL/min (by C-G formula based on SCr of 0.85 mg/dL). Liver Function Tests: Recent Labs  Lab 12/02/19 2053  AST 21  ALT 16  ALKPHOS 82  BILITOT 1.2  PROT 6.9  ALBUMIN 3.2*   No results for input(s): LIPASE, AMYLASE in the last 168 hours. No results for input(s): AMMONIA in the last 168 hours. Coagulation Profile: Recent Labs  Lab 12/05/19 0420  INR 1.2   Cardiac Enzymes: No results for input(s): CKTOTAL, CKMB, CKMBINDEX, TROPONINI in the last 168 hours. BNP (last 3 results) No results for input(s): PROBNP in the last 8760 hours. HbA1C: No results for input(s): HGBA1C in the last 72 hours. CBG: Recent Labs  Lab 12/06/19 1151 12/06/19 1657 12/06/19 2048 12/07/19 0743 12/07/19 1143  GLUCAP  168* 138* 191* 130* 198*   Lipid Profile: No results for input(s): CHOL, HDL, LDLCALC, TRIG, CHOLHDL, LDLDIRECT in the last 72 hours. Thyroid Function Tests: No results for input(s): TSH, T4TOTAL, FREET4, T3FREE, THYROIDAB in the last 72 hours. Anemia Panel: No results for input(s): VITAMINB12, FOLATE, FERRITIN, TIBC, IRON, RETICCTPCT in the last 72  hours. Urine analysis:    Component Value Date/Time   COLORURINE YELLOW 09/25/2018 1932   APPEARANCEUR CLEAR 09/25/2018 1932   LABSPEC 1.027 09/25/2018 1932   PHURINE 5.0 09/25/2018 1932   GLUCOSEU >=500 (A) 09/25/2018 1932   HGBUR NEGATIVE 09/25/2018 1932   BILIRUBINUR NEGATIVE 09/25/2018 1932   KETONESUR NEGATIVE 09/25/2018 1932   PROTEINUR NEGATIVE 09/25/2018 1932   NITRITE NEGATIVE 09/25/2018 1932   LEUKOCYTESUR NEGATIVE 09/25/2018 1932     Angalena Cousineau M.D. [facility] 12/07/2019, 1:04 PM   Call night coverage person covering after 7pm

## 2019-12-07 NOTE — Progress Notes (Signed)
PHARMACY CONSULT NOTE FOR:  OUTPATIENT  PARENTERAL ANTIBIOTIC THERAPY (OPAT)  Indication: Discitis/osteomyelitis  Regimen: Rocephin 2gm IV q24h End date: 01/29/20  IV antibiotic discharge orders are pended. To discharging provider:  please sign these orders via discharge navigator,  Select New Orders & click on the button choice - Manage This Unsigned Work.     Thank you for allowing pharmacy to be a part of this patient's care.  Netta Cedars PharmD, BCPS 12/07/2019, 11:52 AM

## 2019-12-07 NOTE — Progress Notes (Signed)
[facility]   Reason for visit: Follow up on discitis  Interval History: repeat blood culture yesterday ngtd.  Sensitivities with PCN intermediate resistance.  Continues pain.    Physical Exam: Constitutional:  Vitals:   12/06/19 2052 12/07/19 0545  BP: (!) 155/64 (!) 155/68  Pulse: 82 91  Resp: 16 16  Temp: 98.6 F (37 C) 98.6 F (37 C)  SpO2: 100% 97%   patient appears in some distress with back pain Respiratory: Normal respiratory effort; CTA B Cardiovascular: RRR GI: soft, nt, nd  Review of Systems: Constitutional: negative for fevers and chills Gastrointestinal: negative for diarrhea  Lab Results  Component Value Date   WBC 7.8 12/07/2019   HGB 9.3 (L) 12/07/2019   HCT 30.6 (L) 12/07/2019   MCV 89.0 12/07/2019   PLT 196 12/07/2019    Lab Results  Component Value Date   CREATININE 0.85 12/07/2019   BUN 14 12/07/2019   NA 139 12/07/2019   K 3.2 (L) 12/07/2019   CL 115 (H) 12/07/2019   CO2 18 (L) 12/07/2019    Lab Results  Component Value Date   ALT 16 12/02/2019   AST 21 12/02/2019   ALKPHOS 82 12/02/2019     Microbiology: Recent Results (from the past 240 hour(s))  SARS CORONAVIRUS 2 (TAT 6-24 HRS) Nasopharyngeal Nasopharyngeal Swab     Status: None   Collection Time: 12/03/19 12:44 AM   Specimen: Nasopharyngeal Swab  Result Value Ref Range Status   SARS Coronavirus 2 NEGATIVE NEGATIVE Final    Comment: (NOTE) SARS-CoV-2 target nucleic acids are NOT DETECTED. The SARS-CoV-2 RNA is generally detectable in upper and lower respiratory specimens during the acute phase of infection. Negative results do not preclude SARS-CoV-2 infection, do not rule out co-infections with other pathogens, and should not be used as the sole basis for treatment or other patient management decisions. Negative results must be combined with clinical observations, patient history, and epidemiological information. The expected result is  Negative. Fact Sheet for Patients: SugarRoll.be Fact Sheet for Healthcare Providers: https://www.woods-mathews.com/ This test is not yet approved or cleared by the [place] FDA and  has been authorized for detection and/or diagnosis of SARS-CoV-2 by FDA under an Emergency Use Authorization (EUA). This EUA will remain  in effect (meaning this test can be used) for the duration of the COVID-19 declaration under Section 56 4(b)(1) of the Act, 21 U.S.C. section 360bbb-3(b)(1), unless the authorization is terminated or revoked sooner. Performed at [facility], [place] [address] [place], [place] [address]   Culture, blood (routine x 2)     Status: Abnormal   Collection Time: 12/04/19  1:46 PM   Specimen: BLOOD  Result Value Ref Range Status   Specimen Description   Final    BLOOD LEFT ANTECUBITAL Performed at [facility] [address] [place], [place] [address]    Special Requests   Final    BOTTLES DRAWN AEROBIC AND ANAEROBIC Blood Culture adequate volume Performed at [facility] [address] [place], [place] [address]    Culture  Setup Time   Final    GRAM POSITIVE COCCI IN CHAINS IN BOTH AEROBIC AND ANAEROBIC BOTTLES CRITICAL RESULT CALLED TO, READ BACK BY AND VERIFIED WITH: Guadlupe Spanish PharmD 12:20 12/05/19 ([email or web address]) Performed at [facility], [place] [address], [place],  [address]    Culture STREPTOCOCCUS MITIS/ORALIS (A)  Final   Report Status 12/07/2019 FINAL  Final  Organism ID, Bacteria STREPTOCOCCUS MITIS/ORALIS  Final      Susceptibility   Streptococcus mitis/oralis - MIC*    TETRACYCLINE >=16 RESISTANT Resistant     VANCOMYCIN 0.5 SENSITIVE Sensitive     CLINDAMYCIN >=1 RESISTANT Resistant     PENICILLIN Value in next row Intermediate      INTERMEDIATE0.5    CEFTRIAXONE Value in next row Sensitive      SENSITIVE0.5    * STREPTOCOCCUS MITIS/ORALIS  Blood  Culture ID Panel (Reflexed)     Status: Abnormal   Collection Time: 12/04/19  1:46 PM  Result Value Ref Range Status   Enterococcus species NOT DETECTED NOT DETECTED Final   Listeria monocytogenes NOT DETECTED NOT DETECTED Final   Staphylococcus species NOT DETECTED NOT DETECTED Final   Staphylococcus aureus (BCID) NOT DETECTED NOT DETECTED Final   Streptococcus species DETECTED (A) NOT DETECTED Final    Comment: Not Enterococcus species, Streptococcus agalactiae, Streptococcus pyogenes, or Streptococcus pneumoniae. CRITICAL RESULT CALLED TO, READ BACK BY AND VERIFIED WITH: Guadlupe Spanish PharmD 12:20 12/05/19 ([email or web address])    Streptococcus agalactiae NOT DETECTED NOT DETECTED Final   Streptococcus pneumoniae NOT DETECTED NOT DETECTED Final   Streptococcus pyogenes NOT DETECTED NOT DETECTED Final   Acinetobacter baumannii NOT DETECTED NOT DETECTED Final   Enterobacteriaceae species NOT DETECTED NOT DETECTED Final   Enterobacter cloacae complex NOT DETECTED NOT DETECTED Final   Escherichia coli NOT DETECTED NOT DETECTED Final   Klebsiella oxytoca NOT DETECTED NOT DETECTED Final   Klebsiella pneumoniae NOT DETECTED NOT DETECTED Final   Proteus species NOT DETECTED NOT DETECTED Final   Serratia marcescens NOT DETECTED NOT DETECTED Final   Haemophilus influenzae NOT DETECTED NOT DETECTED Final   Neisseria meningitidis NOT DETECTED NOT DETECTED Final   Pseudomonas aeruginosa NOT DETECTED NOT DETECTED Final   Candida albicans NOT DETECTED NOT DETECTED Final   Candida glabrata NOT DETECTED NOT DETECTED Final   Candida krusei NOT DETECTED NOT DETECTED Final   Candida parapsilosis NOT DETECTED NOT DETECTED Final   Candida tropicalis NOT DETECTED NOT DETECTED Final    Comment: Performed at [facility], [place]. [address], [place], [place] [address]  Culture, blood (routine x 2)     Status: Abnormal   Collection Time: 12/04/19  1:59 PM   Specimen: BLOOD  Result Value Ref Range Status   Specimen  Description   Final    BLOOD RIGHT ANTECUBITAL Performed at [facility] [address], [place], [place] [address]    Special Requests   Final    BOTTLES DRAWN AEROBIC AND ANAEROBIC Blood Culture adequate volume Performed at [facility] [address], [place], [place] [address]    Culture  Setup Time   Final    GRAM POSITIVE COCCI IN CHAINS IN BOTH AEROBIC AND ANAEROBIC BOTTLES CRITICAL VALUE NOTED.  VALUE IS CONSISTENT WITH PREVIOUSLY REPORTED AND CALLED VALUE.    Culture (A)  Final    STREPTOCOCCUS MITIS/ORALIS SUSCEPTIBILITIES PERFORMED ON PREVIOUS CULTURE WITHIN THE LAST 5 DAYS. Performed at [facility], [place] [address], [place], [place] [address]    Report Status 12/07/2019 FINAL  Final  Body fluid culture     Status: None   Collection Time: 12/05/19  4:09 PM   Specimen: Back; Body Fluid  Result Value Ref Range Status   Specimen Description   Final    BACK Performed at [facility] [address], [place], [place] [address]  Special Requests   Final    Normal Performed at [facility], [place] [address] [place], [place] [address]    Gram Stain   Final    RARE WBC PRESENT, PREDOMINANTLY PMN MODERATE GRAM POSITIVE COCCI Performed at [facility], [place] [address], [place], [place] [address]    Culture FEW STREPTOCOCCUS MITIS/ORALIS  Final   Report Status 12/07/2019 FINAL  Final   Organism ID, Bacteria STREPTOCOCCUS MITIS/ORALIS  Final      Susceptibility   Streptococcus mitis/oralis - MIC*    TETRACYCLINE >=16 RESISTANT Resistant     VANCOMYCIN 0.5 SENSITIVE Sensitive     CLINDAMYCIN >=1 RESISTANT Resistant     PENICILLIN Value in next row Intermediate      INTERMEDIATE0.5    CEFTRIAXONE Value in next row Sensitive      SENSITIVE1    * FEW STREPTOCOCCUS MITIS/ORALIS  Culture, blood (Routine X 2) w Reflex to ID Panel     Status: None (Preliminary result)   Collection Time:  12/06/19  5:18 AM   Specimen: BLOOD  Result Value Ref Range Status   Specimen Description   Final    BLOOD LEFT ANTECUBITAL Performed at [facility], [place] [place], [place] [address]    Special Requests   Final    BOTTLES DRAWN AEROBIC ONLY Blood Culture adequate volume Performed at [facility], [address] [place], [place] [address]    Culture   Final    NO GROWTH 1 DAY Performed at [facility], [place] [address], [place], [place] [address]    Report Status PENDING  Incomplete    Impression/Plan:  1. Discitis/osteomyelitis - Strep mitis and ceftriaxone sensitive, penicillin resistant.  Will need to continue ceftriaxone 8 weeks through April 22nd.  I discussed with her it will likely be slow to resolve, particularly with pain  2.  HH - weekly labs per home health  3. Access - ok from ID standpoint for picc line placement tomorrow if she is to be discharged tomorrow, as long as blood culture remains negative from yesterday.

## 2019-12-08 ENCOUNTER — Inpatient Hospital Stay: Payer: Self-pay

## 2019-12-08 DIAGNOSIS — E0800 Diabetes mellitus due to underlying condition with hyperosmolarity without nonketotic hyperglycemic-hyperosmolar coma (NKHHC): Secondary | ICD-10-CM

## 2019-12-08 LAB — GLUCOSE, CAPILLARY
Glucose-Capillary: 159 mg/dL — ABNORMAL HIGH (ref 70–99)
Glucose-Capillary: 187 mg/dL — ABNORMAL HIGH (ref 70–99)
Glucose-Capillary: 140 mg/dL — ABNORMAL HIGH (ref 70–99)
Glucose-Capillary: 154 mg/dL — ABNORMAL HIGH (ref 70–99)

## 2019-12-08 LAB — CBC
HCT: 32 % — ABNORMAL LOW (ref 36.0–46.0)
Hemoglobin: 9.9 g/dL — ABNORMAL LOW (ref 12.0–15.0)
MCH: 27.5 pg (ref 26.0–34.0)
MCHC: 30.9 g/dL (ref 30.0–36.0)
MCV: 88.9 fL (ref 80.0–100.0)
Platelets: 181 10*3/uL (ref 150–400)
RBC: 3.6 MIL/uL — ABNORMAL LOW (ref 3.87–5.11)
RDW: 14 % (ref 11.5–15.5)
WBC: 7.3 10*3/uL (ref 4.0–10.5)
nRBC: 0 % (ref 0.0–0.2)

## 2019-12-08 LAB — BASIC METABOLIC PANEL
Anion gap: 8 (ref 5–15)
BUN: 11 mg/dL (ref 8–23)
CO2: 18 mmol/L — ABNORMAL LOW (ref 22–32)
Calcium: 8.4 mg/dL — ABNORMAL LOW (ref 8.9–10.3)
Chloride: 114 mmol/L — ABNORMAL HIGH (ref 98–111)
Creatinine, Ser: 0.77 mg/dL (ref 0.44–1.00)
GFR calc Af Amer: >60 mL/min (ref >60)
GFR calc non Af Amer: >60 mL/min (ref >60)
Glucose, Bld: 147 mg/dL — ABNORMAL HIGH (ref 70–99)
Potassium: 3.5 mmol/L (ref 3.5–5.1)
Sodium: 140 mmol/L (ref 135–145)

## 2019-12-08 MED ORDER — HYDROMORPHONE HCL 1 MG/ML IJ SOLN
0.5000 mg | INTRAMUSCULAR | Status: DC | PRN
  Administered 2019-12-08: 0.5 mg via INTRAVENOUS
  Filled 2019-12-08: qty 0.5

## 2019-12-08 MED ORDER — OXYCODONE HCL ER 10 MG PO T12A
10.0000 mg | EXTENDED_RELEASE_TABLET | Freq: Two times a day (BID) | ORAL | Status: DC
  Administered 2019-12-08 – 2019-12-09 (×3): 10 mg via ORAL
  Filled 2019-12-08 (×3): qty 1

## 2019-12-08 MED ORDER — SODIUM CHLORIDE 0.9% FLUSH: 10.0000 mL | INTRAVENOUS | Status: DC | PRN

## 2019-12-08 MED ORDER — CHLORHEXIDINE GLUCONATE CLOTH 2 % EX PADS
6.0000 | MEDICATED_PAD | Freq: Every day | CUTANEOUS | Status: DC
  Administered 2019-12-09: 6 via TOPICAL

## 2019-12-08 MED ORDER — DOCUSATE SODIUM 100 MG PO CAPS
100.0000 mg | ORAL_CAPSULE | Freq: Two times a day (BID) | ORAL | Status: DC
  Administered 2019-12-08 (×2): 100 mg via ORAL
  Filled 2019-12-08 (×3): qty 1

## 2019-12-08 MED ORDER — OXYCODONE HCL 5 MG PO TABS
5.0000 mg | ORAL_TABLET | ORAL | Status: DC | PRN
  Administered 2019-12-09: 10 mg via ORAL
  Filled 2019-12-08: qty 2

## 2019-12-08 MED ORDER — MAGNESIUM CITRATE PO SOLN
1.0000 | Freq: Once | ORAL | Status: AC
  Administered 2019-12-08: 1 via ORAL
  Filled 2019-12-08: qty 296

## 2019-12-08 MED ORDER — SODIUM CHLORIDE 0.9% FLUSH
10.0000 mL | Freq: Two times a day (BID) | INTRAVENOUS | Status: DC
  Administered 2019-12-08: 10 mL
  Administered 2019-12-08: 22:00:00 20 mL
  Administered 2019-12-09: 10 mL

## 2019-12-08 NOTE — Progress Notes (Signed)
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  Per notes back pain from osteomyelitis vs discitis.  Pt s/p aspiration of T12-L1 disc on 12/05/19 around 1600.   OT comments  This 79 yo female admitted with above presents to acute OT with making progress with ambulation, and working through her pain as best she can. Pain is her biggest limiting factor and husband in room verbalizes that he knows he will have to help her more with her basic ADLs until her pain is much better. Pt can remove her arms from supporting her at EOB but it increases pain so she feels better with UE support which impacts ADLs. Pt will continue to benefit from acute OT with follow up [place].  Follow Up Recommendations  Home health OT;Supervision/Assistance - 24 hour    Equipment Recommendations  None recommended by OT       Precautions / Restrictions Precautions Precautions: Fall Precaution Comments: followed back precautions to help with pain control Restrictions Weight Bearing Restrictions: No       Mobility Bed Mobility Overal bed mobility: Needs Assistance Bed Mobility: Rolling;Sit to Sidelying Rolling: Min guard(with use of rail and increased time) Sidelying to sit: Mod assist(A for trunk)     Sit to sidelying: Mod assist(for legs) General bed mobility comments: educated pt and husband on how husband can use his arm as her "bedrail" for rolling and how to A her up to sit under her shoulders.  Transfers Overall transfer level: Needs  assistance Equipment used: Rolling walker (2 wheeled) Transfers: Sit to/from Stand Sit to Stand: Min assist         General transfer comment: increased time to push up from bed due to pain; pt was able to ambulate to the RN station and back with RW (~150 feet)    Balance Overall balance assessment: Needs assistance Sitting-balance support: Feet supported;Bilateral upper extremity supported Sitting balance-Leahy Scale: Poor Sitting balance - Comments: Feels better with UE support (help relieves pain minimally)   Standing balance support: Bilateral upper extremity supported Standing balance-Leahy Scale: Poor Standing balance comment: reliant on RW                           ADL either performed or assessed with clinical judgement   ADL Overall ADL's : Needs assistance/impaired                         Toilet Transfer: Minimal Insurance claims handler Details (indicate cue type and reason): Simulated bed>door>bed> out door to nursing station>bed           General ADL Comments: Spoke with husband in room and he is aware he wil have to A her more with her basic ADLs until her pain gets better     Vision Patient Visual Report: No change from baseline            Cognition Arousal/Alertness: Awake/alert Behavior During Therapy: WFL for tasks assessed/performed Overall Cognitive Status: Within Functional Limits for tasks assessed  Pertinent Vitals/ Pain       Pain Assessment: 0-10 Pain Score: 7  Pain Location: back (with movement increases, but stedy #7 the whole session) Pain Descriptors / Indicators: Guarding;Aching;Grimacing;Discomfort;Moaning Pain Intervention(s): Limited activity within patient's tolerance;Monitored during session;Repositioned;Heat applied         Frequency  Min 2X/week        Progress Toward Goals  OT Goals(current goals can now be found in  the care plan section)  Progress towards OT goals: Progressing toward goals  Acute Rehab OT Goals Patient Stated Goal: to go home OT Goal Formulation: With patient/family Time For Goal Achievement: 12/09/19  Plan Discharge plan remains appropriate       AM-PAC OT "6 Clicks" Daily Activity     Outcome Measure   Help from another person eating meals?: None Help from another person taking care of personal grooming?: A Little Help from another person toileting, which includes using toliet, bedpan, or urinal?: A Lot Help from another person bathing (including washing, rinsing, drying)?: A Lot Help from another person to put on and taking off regular upper body clothing?: A Little Help from another person to put on and taking off regular lower body clothing?: A Lot 6 Click Score: 16    End of Session Equipment Utilized During Treatment: Gait belt;Rolling walker  OT Visit Diagnosis: Unsteadiness on feet (R26.81);Other abnormalities of gait and mobility (R26.89);Repeated falls (R29.6);Muscle weakness (generalized) (M62.81)   Activity Tolerance Patient limited by pain   Patient Left in bed;with call bell/phone within reach;with family/visitor present;with bed alarm set   Nurse Communication          Time: [id number] OT Time Calculation (min): 33 min  Charges: OT General Charges $OT Visit: 1 Visit OT Treatments $Self Care/Home Management : 23-37 mins  [facility]  OTR/L [place] Pager [phone] Office [phone]      12/08/2019, 4:26 PM

## 2019-12-08 NOTE — Progress Notes (Signed)
Received order for PICC  

## 2019-12-08 NOTE — Progress Notes (Signed)
Peripherally Inserted Central Catheter/Midline Placement  The IV Nurse has discussed with the patient and/or persons authorized to consent for the patient, the purpose of this procedure and the potential benefits and risks involved with this procedure.  The benefits include less needle sticks, lab draws from the catheter, and the patient may be discharged home with the catheter. Risks include, but not limited to, infection, bleeding, blood clot (thrombus formation), and puncture of an artery; nerve damage and irregular heartbeat and possibility to perform a PICC exchange if needed/ordered by physician.  Alternatives to this procedure were also discussed.  Bard Power PICC patient education guide, fact sheet on infection prevention and patient information card has been provided to patient /or left at bedside.    PICC/Midline Placement Documentation  PICC Single Lumen 0000000 PICC Right Basilic 40 cm 0 cm (Active)  Indication for Insertion or Continuance of Line Home intravenous therapies (PICC only) 12/08/19 1325  Exposed Catheter (cm) 0 cm 12/08/19 1325  Site Assessment Clean;Dry;Intact 12/08/19 1325  Line Status Flushed;Saline locked;Blood return noted 12/08/19 1325  Dressing Type Transparent 12/08/19 1325  Dressing Status Clean;Intact;Dry;Antimicrobial disc in place 12/08/19 1325  Dressing Change Due 12/15/19 12/08/19 1325       [facility] Payment 12/08/2019, 1:26 PM

## 2019-12-08 NOTE — Progress Notes (Signed)
PT Cancellation Note  Patient Details Name: Doris Jefferson MRN: [id number] DOB: March 27, 1941   Cancelled Treatment:    Reason Eval/Treat Not Completed: Fatigue/lethargy limiting ability to participate,following OT with a great participation per OT. Will check back tomorrow.    Claretha Cooper 12/08/2019, 3:09 PM [facility] Pager [phone] Office [phone]

## 2019-12-08 NOTE — Progress Notes (Addendum)
[facility]                                                                              Patient Demographics  Doris Jefferson, is a 79 y.o. female, DOB - 08-12-1941, TOI:[id number]  Admit date - 12/02/2019   Admitting Physician Eric J [place] DO  Outpatient Primary MD for the patient is Aletha Halim., PA-C  Outpatient specialists:   LOS - 5  days   Medical records reviewed and are as summarized below:    Chief Complaint  Patient presents with  . Shortness of Breath       Brief summary   Doris Jefferson is a 79 year old Caucasian female with past medical history remarkable for essential hypertension, hyperlipidemia, type 2 diabetes mellitus, CAD status post CABG, obesity/OSA, depression, GERD, paroxysmal atrial fibrillation, iron deficiency anemia who presented to the ED with intractable back pain.  She was in her usual state of health until Saturday night when she turned awkwardly in her bed trying to get out and use the bathroom and had sudden onset severe back pain.  She reports it is localized to the low back, described as a bandlike sensation that is sharp.  No recent injuries or falls.   Hospital course so far MRI of the L-spine showed fluid to disc interspace with endplate edema, infiltration of anterior paraspinous fat T12-L1 suspicious for discitis and osteomyelitis, questionable phlegmon versus disc bulge at T12-L1 Status post IR biopsy, aspiration, started on IV Rocephin Blood cultures+ Streptococcus mitis/ oralis ID following.  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  Phlegmon versus broad-based disc bulge at T12-L1 Streptococcus bacteremia, POA -Patient presented to ED with acute onset low back pain.  CT abdomen pelvis with no acute musculoskeletal findings.  MRI of L-spine showed T12-L1 discitis versus osteomyelitis, ?  Phlegmon versus disc bulge at the T12-L1  level -ESR 64, CRP 15.0 -Dr. [place] discussed with neurosurgery, Dr. Vertell Limber, difficult to discern given lack of contrast, recommended to proceed with IR biopsy/aspiration - 2D echo showed EF of 60 to 83%, grade 2 diastolic dysfunction, mildly reduced RV systolic function, mild MR, mild aortic valve stenosis, normal IVC, no obvious vegetations - Status post IR biopsy/aspiration, cultures positive for strep mitis/oralis - blood cultures positive for Streptococcus mitis/oralis, resistant to penicillin, sensitive to ceftriaxone -  Appreciate ID recommendations, continue ceftriaxone 8 weeks through 01/29/2020 -Repeat blood cultures negative so far, will place PICC line today   Acute back pain -Secondary to #1 -Patient continues to complain of intractable back pain, adding long-acting OxyContin 10 mg every 12 hours, oxycodone as needed for breakthrough pain.  Monitor closely for any respiratory depression, altered mental status, discussed with RN -  PT OT and out of bed today to assess her functionality for ADLs - added bowl regimen, had a BM today  Leukocytosis -- Resolved, likely due to #1  CAD s/p CABG Paroxysmal Atrial Fibrillation s/p LAA ligation s/p Bioprosthetic AVR -Continue aspirin, Coreg, rosuvastatin, no acute issues  Pulmonary HTN -Continue sildenafil  OSA --Continue CPAP  CKD stage 3b Baseline  creatinine between 1.1-1.5,  --Cr 1.40 at the time of admission,, improved, 0.7 today  Diabetes mellitus type 2, NIDDM, controlled -Outpatient on Jardiance, Victoza, Metformin -Hold outpatient regimen, continue sliding scale insulin while inpatient  Essential HTN --BP stable, elevated readings due to pain  HLD --Continue pravastatin  Depression -- Continue Wellbutrin  Constipation - added mag citrate, miralax    Code Status: Full CODE STATUS DVT Prophylaxis: SCDs Family Communication: Discussed all imaging results, lab results, explained to the patient and  husband on the phone.  Per patient's husband, she was scheduled to have Covid vaccine dose #2 tomorrow, he has now canceled it.  Per patient's husband, they requested physician note to be rescheduled again.   Disposition Plan: Patient from home. Anticipated discharge home in next 24 hours.  PICC line being placed today.  Continues to have significant pain in her back, requiring IV pain medications.  DC home once pain is better controlled, hopefully tomorrow  Time Spent in minutes 25 minutes  Procedures:  IR biopsy/aspiration  Consultants:    Interventional radiology  Infectious disease - Dr. Johnnye Sima  Neurosurgery - Dr. Vertell Limber, case discussed on 12/04/2019  Antimicrobials:   Anti-infectives (From admission, onward)   Start     Dose/Rate Route Frequency Ordered Stop   12/05/19 1600  cefTRIAXone (ROCEPHIN) 2 g in sodium chloride 0.9 % 100 mL IVPB     2 g 200 mL/hr over 30 Minutes Intravenous Daily 12/05/19 1537           Medications  Scheduled Meds: . aspirin EC  81 mg Oral Daily  . buPROPion  300 mg Oral Daily  . carvedilol  12.5 mg Oral BID WC  . docusate sodium  100 mg Oral BID  . insulin aspart  0-5 Units Subcutaneous QHS  . insulin aspart  0-9 Units Subcutaneous TID WC  . loratadine  10 mg Oral Daily  . methocarbamol  1,000 mg Oral Q8H  . oxyCODONE  10 mg Oral Q12H  . polyethylene glycol  17 g Oral Daily  . rosuvastatin  20 mg Oral Daily  . senna-docusate  2 tablet Oral BID  . sildenafil  20 mg Oral TID  . sodium chloride flush  3 mL Intravenous Q12H   Continuous Infusions: . cefTRIAXone (ROCEPHIN)  IV 2 g (12/07/19 1304)   PRN Meds:.acetaminophen **OR** acetaminophen, acetaminophen, HYDROmorphone (DILAUDID) injection, loperamide, LORazepam, oxyCODONE      Subjective:   Doris Jefferson was seen and examined today.  Continues to complain of intractable back pain, on moving.  Pain is little better from yesterday but states 8/10 on moving.  No fevers or chills.   No FND's.  Patient denies dizziness, chest pain, shortness of breath, abdominal pain, N/V, new weakness, numbess, tingling.  No acute events overnight.  Objective:   Vitals:   12/07/19 1802 12/07/19 2149 12/08/19 0601 12/08/19 0954  BP: (!) 144/55 (!) 142/59 (!) 143/64 (!) 155/63  Pulse: 90 89 86 79  Resp:  16 16   Temp:  98.5 F (36.9 C) 99.1 F (37.3 C)   TempSrc:  Oral Oral   SpO2: 97% 97% 99%   Weight:      Height:        Intake/Output Summary (Last 24 hours) at 12/08/2019 1315 Last data filed at 12/08/2019 0945 Gross per 24 hour  Intake 3 ml  Output 1700 ml  Net -1697 ml     Wt Readings from Last 3 Encounters:  12/02/19 77.1 kg  11/29/19 77.1  kg  11/06/19 78.5 kg   Physical Exam  General: Alert and oriented x 3, NAD, uncomfortable on minimal movement  Cardiovascular: S1 S2 clear, RRR. No pedal edema b/l  Respiratory: CTAB, no wheezing, rales or rhonchi  Gastrointestinal: Soft, nontender, nondistended, NBS  Ext: no pedal edema bilaterally  Neuro: no new deficits  Musculoskeletal: No cyanosis, clubbing  Skin: No rashes  Psych: Normal affect and demeanor, alert and oriented x3     Data Reviewed:  I have personally reviewed following labs and imaging studies  Micro Results Recent Results (from the past 240 hour(s))  SARS CORONAVIRUS 2 (TAT 6-24 HRS) Nasopharyngeal Nasopharyngeal Swab     Status: None   Collection Time: 12/03/19 12:44 AM   Specimen: Nasopharyngeal Swab  Result Value Ref Range Status   SARS Coronavirus 2 NEGATIVE NEGATIVE Final    Comment: (NOTE) SARS-CoV-2 target nucleic acids are NOT DETECTED. The SARS-CoV-2 RNA is generally detectable in upper and lower respiratory specimens during the acute phase of infection. Negative results do not preclude SARS-CoV-2 infection, do not rule out co-infections with other pathogens, and should not be used as the sole basis for treatment or other patient management decisions. Negative results must be  combined with clinical observations, patient history, and epidemiological information. The expected result is Negative. Fact Sheet for Patients: SugarRoll.be Fact Sheet for Healthcare Providers: https://www.woods-mathews.com/ This test is not yet approved or cleared by the [place] FDA and  has been authorized for detection and/or diagnosis of SARS-CoV-2 by FDA under an Emergency Use Authorization (EUA). This EUA will remain  in effect (meaning this test can be used) for the duration of the COVID-19 declaration under Section 56 4(b)(1) of the Act, 21 U.S.C. section 360bbb-3(b)(1), unless the authorization is terminated or revoked sooner. Performed at [facility], [place] [address], [place], [place] [address]   Culture, blood (routine x 2)     Status: Abnormal   Collection Time: 12/04/19  1:46 PM   Specimen: BLOOD  Result Value Ref Range Status   Specimen Description   Final    BLOOD LEFT ANTECUBITAL Performed at [facility] [address], [place], [place]  [address]    Special Requests   Final    BOTTLES DRAWN AEROBIC AND ANAEROBIC Blood Culture adequate volume Performed at [facility] [address], [place], [place] [address]    Culture  Setup Time   Final    GRAM POSITIVE COCCI IN CHAINS IN BOTH AEROBIC AND ANAEROBIC BOTTLES CRITICAL RESULT CALLED TO, READ BACK BY AND VERIFIED WITH: Guadlupe Spanish PharmD 12:20 12/05/19 ([email or web address]) Performed at [facility], [place] [address], [place],  [address]    Culture STREPTOCOCCUS MITIS/ORALIS (A)  Final   Report Status 12/07/2019 FINAL  Final   Organism ID, Bacteria STREPTOCOCCUS MITIS/ORALIS  Final      Susceptibility   Streptococcus mitis/oralis - MIC*    TETRACYCLINE >=16 RESISTANT Resistant     VANCOMYCIN 0.5 SENSITIVE Sensitive     CLINDAMYCIN >=1 RESISTANT Resistant     PENICILLIN Value in next row Intermediate       INTERMEDIATE0.5    CEFTRIAXONE Value in next row Sensitive      SENSITIVE0.5    * STREPTOCOCCUS MITIS/ORALIS  Blood Culture ID Panel (Reflexed)     Status: Abnormal   Collection Time: 12/04/19  1:46 PM  Result Value Ref Range Status   Enterococcus species NOT DETECTED NOT DETECTED Final   Listeria monocytogenes NOT  DETECTED NOT DETECTED Final   Staphylococcus species NOT DETECTED NOT DETECTED Final   Staphylococcus aureus (BCID) NOT DETECTED NOT DETECTED Final   Streptococcus species DETECTED (A) NOT DETECTED Final    Comment: Not Enterococcus species, Streptococcus agalactiae, Streptococcus pyogenes, or Streptococcus pneumoniae. CRITICAL RESULT CALLED TO, READ BACK BY AND VERIFIED WITH: Guadlupe Spanish PharmD 12:20 12/05/19 ([email or web address])    Streptococcus agalactiae NOT DETECTED NOT DETECTED Final   Streptococcus pneumoniae NOT DETECTED NOT DETECTED Final   Streptococcus pyogenes NOT DETECTED NOT DETECTED Final   Acinetobacter baumannii NOT DETECTED NOT DETECTED Final   Enterobacteriaceae species NOT DETECTED NOT DETECTED Final   Enterobacter cloacae complex NOT DETECTED NOT DETECTED Final   Escherichia coli NOT DETECTED NOT DETECTED Final   Klebsiella oxytoca NOT DETECTED NOT DETECTED Final   Klebsiella pneumoniae NOT DETECTED NOT DETECTED Final   Proteus species NOT DETECTED NOT DETECTED Final   Serratia marcescens NOT DETECTED NOT DETECTED Final   Haemophilus influenzae NOT DETECTED NOT DETECTED Final   Neisseria meningitidis NOT DETECTED NOT DETECTED Final   Pseudomonas aeruginosa NOT DETECTED NOT DETECTED Final   Candida albicans NOT DETECTED NOT DETECTED Final   Candida glabrata NOT DETECTED NOT DETECTED Final   Candida krusei NOT DETECTED NOT DETECTED Final   Candida parapsilosis NOT DETECTED NOT DETECTED Final   Candida tropicalis NOT DETECTED NOT DETECTED Final    Comment: Performed at [facility], [place]. [address], [place], [place] [address]  Culture, blood (routine x 2)      Status: Abnormal   Collection Time: 12/04/19  1:59 PM   Specimen: BLOOD  Result Value Ref Range Status   Specimen Description   Final    BLOOD RIGHT ANTECUBITAL Performed at [facility] [address], [place], [place] [address]    Special Requests   Final    BOTTLES DRAWN AEROBIC AND ANAEROBIC Blood Culture adequate volume Performed at [facility] [address] [place], [place] [address]    Culture  Setup Time   Final    GRAM POSITIVE COCCI IN CHAINS IN BOTH AEROBIC AND ANAEROBIC BOTTLES CRITICAL VALUE NOTED.  VALUE IS CONSISTENT WITH PREVIOUSLY REPORTED AND CALLED VALUE.    Culture (A)  Final    STREPTOCOCCUS MITIS/ORALIS SUSCEPTIBILITIES PERFORMED ON PREVIOUS CULTURE WITHIN THE LAST 5 DAYS. Performed at [facility], [place] [address], [place], [place] [address]    Report Status 12/07/2019 FINAL  Final  Body fluid culture     Status: None   Collection Time: 12/05/19  4:09 PM   Specimen: Back; Body Fluid  Result Value Ref Range Status   Specimen Description   Final    BACK Performed at [facility] [address], [place], [place] [address]    Special Requests   Final    Normal Performed at [facility], [place] [address], [place], [place] [address]    Gram Stain   Final    RARE WBC PRESENT, PREDOMINANTLY PMN MODERATE GRAM POSITIVE COCCI Performed at [facility], [place] [address], [place], [place] [address]    Culture FEW STREPTOCOCCUS MITIS/ORALIS  Final   Report Status 12/07/2019 FINAL  Final   Organism ID, Bacteria STREPTOCOCCUS MITIS/ORALIS  Final      Susceptibility   Streptococcus mitis/oralis - MIC*    TETRACYCLINE >=16 RESISTANT Resistant     VANCOMYCIN 0.5 SENSITIVE Sensitive     CLINDAMYCIN >=1 RESISTANT Resistant  PENICILLIN Value in next row Intermediate      INTERMEDIATE0.5    CEFTRIAXONE Value in next row Sensitive      SENSITIVE1    * FEW STREPTOCOCCUS  MITIS/ORALIS  Culture, blood (Routine X 2) w Reflex to ID Panel     Status: None (Preliminary result)   Collection Time: 12/06/19  5:18 AM   Specimen: BLOOD  Result Value Ref Range Status   Specimen Description   Final    BLOOD LEFT ANTECUBITAL Performed at [facility], [place] [place], [place] [address]    Special Requests   Final    BOTTLES DRAWN AEROBIC ONLY Blood Culture adequate volume Performed at [facility], [place] [place], [place] [address]    Culture   Final    NO GROWTH 2 DAYS Performed at [facility], [place] [address], [place], [place] [address]    Report Status PENDING  Incomplete    Radiology Reports DG Chest 2 View  Result Date: 12/02/2019 CLINICAL DATA:  79 year old female with shortness of breath EXAM: CHEST - 2 VIEW COMPARISON:  Chest radiograph dated 12/12/2018. FINDINGS: There is no focal consolidation, pleural effusion, or pneumothorax. Mild diffuse interstitial coarsening. The cardiac silhouette is within normal limits. Median sternotomy wires, mechanical heart valve and left atrial appendage occlusive device. No acute osseous pathology. Cervical ACDF. IMPRESSION: No active cardiopulmonary disease. Electronically Signed   By: Anner Crete M.D.   On: 12/02/2019 18:36   DG Lumbar Spine 2-3 Views  Result Date: 12/01/2019 CLINICAL DATA:  Low back pain for 2 days.  No known injury. EXAM: LUMBAR SPINE - 2-3 VIEW COMPARISON:  None. FINDINGS: The patient has transitional lumbosacral anatomy with rudimentary ribs off what is presumed to be the first lumbar segment. No fracture or malalignment. Multilevel marked loss of disc space height appears worst at L1-2, L2-3 and L3-4. Extensive atherosclerosis is noted. IMPRESSION: No acute abnormality. Multilevel degenerative disease. Transitional lumbosacral anatomy. Atherosclerosis. Electronically Signed   By: Inge Rise M.D.   On: 12/01/2019 16:09   MR LUMBAR SPINE WO  CONTRAST  Result Date: 12/04/2019 CLINICAL DATA:  Intractable low back pain since a twisting injury the night of 11/29/2019. Initial encounter. EXAM: MRI LUMBAR SPINE WITHOUT CONTRAST TECHNIQUE: Multiplanar, multisequence MR imaging of the lumbar spine was performed. No intravenous contrast was administered. COMPARISON:  CT abdomen and pelvis 12/02/2019 and plain films lumbar spine 12/01/2019 FINDINGS: Segmentation: The patient has transitional lumbosacral anatomy. Numbering scheme on this examination is based on the lowest visible pair of ribs on the prior plain films and CT scan. Based on this numbering scheme, the L5 segment is sacralized and transitional. Alignment:  Maintained. Vertebrae: There is some fluid in the disc interspace at T12-L1 and loss of normal fat signal and paraspinous fat anterior to the disc interspace. Mild edema is present in the endplates. No fracture. Conus medullaris and cauda equina: Conus extends to the L1 level. Conus and cauda equina appear normal. Paraspinal and other soft tissues: See report of abdomen and pelvis CT scan. Disc levels: T10-11 and T11-12 are imaged in the sagittal plane only. There is mild disc bulging at both levels but no stenosis. T12-L1: There is a broad-based disc bulge or phlegmon effacing the ventral thecal sac. A very thin focus of epidural fluid is seen just superior to the disc interspace on sagittal image 8 of series 7. There is moderate central canal stenosis. Foramina are open. L1-2:  Broad-based disc bulge causes mild to moderate central canal stenosis. Foramina are open. L2-3: Bilateral facet degenerative change, ligamentum flavum thickening and a shallow disc bulge. There is mild to moderate central canal stenosis. Foramina are open. L3-4: Facet degenerative disease, mild disc bulge and ligamentum flavum thickening. There is mild central canal stenosis. Foramina are open. L4-5: Central and eccentric to the right broad-based protrusion causes mild  central canal stenosis and narrowing in the right subarticular recess. There is some facet degenerative change. Foramina are open. L5-S1: Transitional segment.  Negative. IMPRESSION: Transitional lumbosacral anatomy. Please see numbering scheme above and correlate with plain films if any intervention is planned. Fluid in the disc interspace with endplate edema and infiltration of anterior paraspinous fat at T12-L1 could be due to degenerative change but is most suspicious for discitis and osteomyelitis. Likely ventral epidural phlegmon narrows the ventral thecal sac causing mild to moderate central canal stenosis at T12-L1. Moderate central canal stenosis L2-3. Mild central canal stenosis at L4-5 where there is also narrowing in the right subarticular recess which could impact the right L5 root. Electronically Signed   By: Inge Rise M.D.   On: 12/04/2019 12:06   CT ABDOMEN PELVIS W CONTRAST  Result Date: 12/02/2019 CLINICAL DATA:  Abdominal pain, back pain, concern for compression deformity versus acute aortic process EXAM: CT ABDOMEN AND PELVIS WITH CONTRAST TECHNIQUE: Multidetector CT imaging of the abdomen and pelvis was performed using the standard protocol following bolus administration of intravenous contrast. CONTRAST:  38m OMNIPAQUE IOHEXOL 300 MG/ML  SOLN COMPARISON:  Abdominal radiograph 10/24/2006 FINDINGS: Lower chest: Bandlike atelectasis in the lingula. Lung bases are otherwise clear. Normal heart size. No pericardial effusion. Coronary arteries are calcified. Hepatobiliary: No focal liver abnormality is seen. No gallstones, gallbladder wall thickening, or biliary dilatation. Pancreas: Unremarkable. No pancreatic ductal dilatation or surrounding inflammatory changes. Spleen: Normal in size without focal abnormality. Adrenals/Urinary Tract: Normal adrenal glands. Scattered subcentimeter hypoattenuating foci in both kidneys too small to fully characterize on CT imaging but statistically  likely benign. No worrisome renal lesions. No urolithiasis or hydronephrosis. Normal urinary bladder. Stomach/Bowel: Small fluid-filled type 2 hiatal hernia. Distal stomach is unremarkable. Duodenal sweep takes a normal course. No small bowel dilatation or wall thickening. A normal appendix is visualized. There is a slightly tortuous and redundant colon particularly the right hemicolon. Portion of the transverse colon protrudes into a large ventral hernia defect without evidence of resulting mechanical obstruction at this time. Distal colon demonstrates some scattered colonic diverticula without diverticular inflammation. Vascular/Lymphatic: Atherosclerotic plaque within the normal caliber aorta. No acute aortic abnormalities clearly identified within the limitations of this non angiographic technique. No periaortic stranding or hemorrhage. No aneurysm or ectasia. No suspicious or enlarged lymph nodes in the included lymphatic chains. Reproductive: Uterus is surgically absent. No concerning adnexal lesions. Other: Posterior body wall edema. Large ventral hernia defect appears to protrude appears to protrude medial to the rectus sheath accounting for abdominal wall laxity and rightward displacement of the linea alba. Hernia sac contains portion of the transverse colon and omental fat with some stranding within the herniated fat contents. A smaller superimposed fat containing hernia seen just inferiorly as well., fascial defect measures approximately 3.6 by 4.9 cm transverse by craniocaudal. Few benign-appearing calcifications present within the herniated fat content. No free fluid or free air. Musculoskeletal: Ventral hernia, as above. Laxity of the anterior abdominal wall with atrophy of the rectus sheath and oblique musculature. No visible compression deformity or acute osseous injury  is identified. The osseous structures appear diffusely demineralized which may limit detection of small or nondisplaced fractures.  Transitional lumbosacral anatomy with lumbarization of the L5 vertebrae. IMPRESSION: 1. Aortic Atherosclerosis (ICD10-I70.0). No acute aortic abnormality. 2. The osseous structures appear diffusely demineralized which may limit detection of small or nondisplaced fractures. No visible compression deformity or acute osseous injury is identified. 3. Large ventral hernia defect appears to protrude medial to the rectus sheath accounting for abdominal wall laxity and rightward displacement of the linea alba. Hernia sac contains portion of the transverse colon and omental fat with some stranding within the herniated fat contents which could reflect some fat strangulation. No evidence of resulting mechanical colonic obstruction at this time. 4. Small fluid-filled type 2 paraesophageal hiatal hernia. 5. Scattered colonic diverticula without diverticular inflammation. 6. Coronary arteries are calcified. These results were called by telephone at the time of interpretation on 12/02/2019 at 10:54 pm to provider [facility] , who verbally acknowledged these results. Electronically Signed   By: Lovena Le M.D.   On: 12/02/2019 22:54   IR Fluoro Guide Ndl Plmt / BX  Result Date: 12/05/2019 INDICATION: 79 year old female with T12-L1 discitis osteomyelitis. She presents for aspiration. EXAM: Disc aspiration MEDICATIONS: The patient is currently admitted to the hospital and receiving intravenous antibiotics. The antibiotics were administered within an appropriate time frame prior to the initiation of the procedure. ANESTHESIA/SEDATION: Fentanyl 100 mcg IV; Versed 2 mg IV Moderate Sedation Time:  10 minutes The patient was continuously monitored during the procedure by the interventional radiology nurse under my direct supervision. COMPLICATIONS: None immediate. PROCEDURE: Informed written consent was obtained from the patient after a thorough discussion of the procedural risks, benefits and alternatives. All questions were  addressed. Maximal Sterile Barrier Technique was utilized including caps, mask, sterile gowns, sterile gloves, sterile drape, hand hygiene and skin antiseptic. A timeout was performed prior to the initiation of the procedure. The T12-L1 disc space was localized. A suitable skin entry site was selected and marked. Local anesthesia was attained by infiltration with 1% lidocaine. From a right oblique posterolateral approach, a 22 gauge Chiba needle was carefully advanced into the disc space. The needle was advanced across the midline. Aspiration was then performed as the needle was withdrawn yielding approximately 1 mL of turbid serosanguineous fluid. This was sent for culture. The patient tolerated the procedure well. IMPRESSION: Technically successful T12-L1 disc aspiration yielding 1 mL bloody fluid which was sent for culture. Electronically Signed   By: Jacqulynn Cadet M.D.   On: 12/05/2019 16:22   ECHOCARDIOGRAM COMPLETE  Result Date: 12/04/2019    ECHOCARDIOGRAM REPORT   Patient Name:   Doris Jefferson Vader Date of Exam: 12/04/2019 Medical Rec #:  [id number]      Height:       63.0 in Accession #:    [id number]     Weight:       170.0 lb Date of Birth:  1940/12/28       BSA:          1.805 m Patient Age:    69 years       BP:           137/56 mmHg Patient Gender: F              HR:           78 bpm. Exam Location:  Inpatient Procedure: 2D Echo, Cardiac Doppler, Color Doppler and Intracardiac  Opacification Agent Indications:    Murmur 785.2  History:        Patient has prior history of Echocardiogram examinations, most                 recent 05/29/2019. Prior CABG, Pulmonary HTN; Risk                 Factors:Hypertension, Diabetes, Dyslipidemia and Non-Smoker.                 Aortic Valve: 21 mm valve is present in the aortic position.                 Procedure Date: 09/27/2018.  Sonographer:    Vickie Epley RDCS Referring Phys: [id number] ERIC J [place] IMPRESSIONS  1. Left ventricular ejection fraction, by  estimation, is 60 to 65%. The left ventricle has normal function. The left ventricle has no regional wall motion abnormalities. Left ventricular diastolic parameters are consistent with Grade II diastolic dysfunction (pseudonormalization). Elevated left ventricular end-diastolic pressure.  2. Right ventricular systolic function is mildly reduced. The right ventricular size is normal. There is moderately elevated pulmonary artery systolic pressure.  3. The mitral valve is normal in structure and function. Mild mitral valve regurgitation. No evidence of mitral stenosis.  4. The aortic valve is tricuspid. Aortic valve regurgitation is not visualized. Mild aortic valve stenosis. There is a 21 mm valve present in the aortic position. Procedure Date: 09/27/2018.  5. The inferior vena cava is normal in size with greater than 50% respiratory variability, suggesting right atrial pressure of 3 mmHg. FINDINGS  Left Ventricle: Left ventricular ejection fraction, by estimation, is 60 to 65%. The left ventricle has normal function. The left ventricle has no regional wall motion abnormalities. Definity contrast agent was given IV to delineate the left ventricular  endocardial borders. The left ventricular internal cavity size was normal in size. There is no left ventricular hypertrophy. Left ventricular diastolic parameters are consistent with Grade II diastolic dysfunction (pseudonormalization). Elevated left ventricular end-diastolic pressure. Right Ventricle: The right ventricular size is normal. No increase in right ventricular wall thickness. Right ventricular systolic function is mildly reduced. There is moderately elevated pulmonary artery systolic pressure. The tricuspid regurgitant velocity is 3.87 m/s, and with an assumed right atrial pressure of 3 mmHg, the estimated right ventricular systolic pressure is 65.0 mmHg. Left Atrium: Left atrial size was normal in size. Right Atrium: Right atrial size was normal in size.  Pericardium: There is no evidence of pericardial effusion. Mitral Valve: The mitral valve is normal in structure and function. There is moderate calcification of the anterior mitral valve leaflet(s). Normal mobility of the mitral valve leaflets. Moderate mitral annular calcification. Mild mitral valve regurgitation. No evidence of mitral valve stenosis. Tricuspid Valve: The tricuspid valve is normal in structure. Tricuspid valve regurgitation is mild . No evidence of tricuspid stenosis. Aortic Valve: The aortic valve is tricuspid. . There is mild thickening and mild calcification of the aortic valve. Aortic valve regurgitation is not visualized. Mild aortic stenosis is present. Mild to moderate aortic valve annular calcification. There is mild thickening of the aortic valve. There is mild calcification of the aortic valve. Aortic valve mean gradient measures 13.0 mmHg. Aortic valve peak gradient measures 22.8 mmHg. There is a 21 mm valve present in the aortic position. Procedure Date: 09/27/2018. Pulmonic Valve: The pulmonic valve was normal in structure. Pulmonic valve regurgitation is not visualized. No evidence of pulmonic stenosis. Aorta: The aortic root is normal  in size and structure. Venous: The inferior vena cava is normal in size with greater than 50% respiratory variability, suggesting right atrial pressure of 3 mmHg. IAS/Shunts: No atrial level shunt detected by color flow Doppler.  LEFT VENTRICLE PLAX 2D LVIDd:         4.10 cm Diastology LVIDs:         2.90 cm LV e' lateral:   6.83 cm/s LV PW:         0.90 cm LV E/e' lateral: 21.7 LV IVS:        0.90 cm LV e' medial:    4.91 cm/s                        LV E/e' medial:  30.1  RIGHT VENTRICLE RV S prime:     7.62 cm/s TAPSE (M-mode): 1.5 cm LEFT ATRIUM             Index       RIGHT ATRIUM          Index LA diam:        4.00 cm 2.22 cm/m  RA Area:     8.74 cm LA Vol (A2C):   41.9 ml 23.22 ml/m RA Volume:   13.90 ml 7.70 ml/m LA Vol (A4C):   39.7 ml  22.00 ml/m LA Biplane Vol: 46.2 ml 25.60 ml/m  AORTIC VALVE AV Vmax:           238.50 cm/s AV Vmean:          172.500 cm/s AV VTI:            0.488 m AV Peak Grad:      22.8 mmHg AV Mean Grad:      13.0 mmHg LVOT Vmax:         148.00 cm/s LVOT Vmean:        97.500 cm/s LVOT VTI:          0.295 m LVOT/AV VTI ratio: 0.60 MITRAL VALVE                TRICUSPID VALVE MV Area (PHT): 3.30 cm     TR Peak grad:   59.9 mmHg MV Decel Time: 230 msec     TR Vmax:        387.00 cm/s MV E velocity: 148.00 cm/s MV A velocity: 108.00 cm/s  SHUNTS MV E/A ratio:  1.37         Systemic VTI: 0.30 m Fransico Him MD Electronically signed by Fransico Him MD Signature Date/Time: 12/04/2019/3:58:31 PM    Final    [place] EKG SITE RITE  Result Date: 12/08/2019 If Site Rite image not attached, placement could not be confirmed due to current cardiac rhythm.   Lab Data:  CBC: Recent Labs  Lab 12/04/19 0439 12/05/19 0420 12/06/19 0518 12/07/19 0528 12/08/19 0526  WBC 8.8 9.0 10.0 7.8 7.3  NEUTROABS  --  5.9  --   --   --   HGB 9.6* 9.9* 9.3* 9.3* 9.9*  HCT 30.6* 33.1* 31.3* 30.6* 32.0*  MCV 86.7 90.7 90.7 89.0 88.9  PLT 183 190 198 196 629   Basic Metabolic Panel: Recent Labs  Lab 12/04/19 0439 12/05/19 0420 12/06/19 0518 12/07/19 0528 12/08/19 0526  NA 143 145 144 139 140  K 3.4* 3.8 3.8 3.2* 3.5  CL 115* 116* 114* 115* 114*  CO2 17* 21* 17* 18* 18*  GLUCOSE 170* 144* 124* 124* 147*  BUN 23 20  16 14 11   CREATININE 1.12* 1.04* 0.93 0.85 0.77  CALCIUM 8.4* 8.9 8.4* 8.2* 8.4*   GFR: Estimated Creatinine Clearance: 57 mL/min (by C-G formula based on SCr of 0.77 mg/dL). Liver Function Tests: Recent Labs  Lab 12/02/19 2053  AST 21  ALT 16  ALKPHOS 82  BILITOT 1.2  PROT 6.9  ALBUMIN 3.2*   No results for input(s): LIPASE, AMYLASE in the last 168 hours. No results for input(s): AMMONIA in the last 168 hours. Coagulation Profile: Recent Labs  Lab 12/05/19 0420  INR 1.2   Cardiac Enzymes: No  results for input(s): CKTOTAL, CKMB, CKMBINDEX, TROPONINI in the last 168 hours. BNP (last 3 results) No results for input(s): PROBNP in the last 8760 hours. HbA1C: No results for input(s): HGBA1C in the last 72 hours. CBG: Recent Labs  Lab 12/07/19 1143 12/07/19 1655 12/07/19 2044 12/08/19 0733 12/08/19 1216  GLUCAP 198* 169* 155* 159* 187*   Lipid Profile: No results for input(s): CHOL, HDL, LDLCALC, TRIG, CHOLHDL, LDLDIRECT in the last 72 hours. Thyroid Function Tests: No results for input(s): TSH, T4TOTAL, FREET4, T3FREE, THYROIDAB in the last 72 hours. Anemia Panel: No results for input(s): VITAMINB12, FOLATE, FERRITIN, TIBC, IRON, RETICCTPCT in the last 72 hours. Urine analysis:    Component Value Date/Time   COLORURINE YELLOW 09/25/2018 1932   APPEARANCEUR CLEAR 09/25/2018 1932   LABSPEC 1.027 09/25/2018 1932   PHURINE 5.0 09/25/2018 1932   GLUCOSEU >=500 (A) 09/25/2018 1932   HGBUR NEGATIVE 09/25/2018 1932   BILIRUBINUR NEGATIVE 09/25/2018 1932   KETONESUR NEGATIVE 09/25/2018 1932   PROTEINUR NEGATIVE 09/25/2018 1932   NITRITE NEGATIVE 09/25/2018 1932   LEUKOCYTESUR NEGATIVE 09/25/2018 1932     Aaleeyah Bias M.D. [facility] 12/08/2019, 1:15 PM   Call night coverage person covering after 7pm

## 2019-12-08 NOTE — Care Management Important Message (Signed)
Important Message  Patient Details IM Letter given to Marney Doctor RN Case Manager to present to the Patient Name: Doris Jefferson MRN: [id number] Date of Birth: April 05, 1941   Medicare Important Message Given:  Yes     Kerin Salen 12/08/2019, 9:54 AM

## 2019-12-09 ENCOUNTER — Telehealth: Payer: Self-pay | Admitting: Internal Medicine

## 2019-12-09 ENCOUNTER — Ambulatory Visit: Payer: Medicare Other

## 2019-12-09 DIAGNOSIS — I10 Essential (primary) hypertension: Secondary | ICD-10-CM

## 2019-12-09 LAB — BASIC METABOLIC PANEL
Anion gap: 11 (ref 5–15)
BUN: 15 mg/dL (ref 8–23)
CO2: 21 mmol/L — ABNORMAL LOW (ref 22–32)
Calcium: 8.4 mg/dL — ABNORMAL LOW (ref 8.9–10.3)
Chloride: 107 mmol/L (ref 98–111)
Creatinine, Ser: 0.8 mg/dL (ref 0.44–1.00)
GFR calc Af Amer: >60 mL/min (ref >60)
GFR calc non Af Amer: >60 mL/min (ref >60)
Glucose, Bld: 152 mg/dL — ABNORMAL HIGH (ref 70–99)
Potassium: 3.5 mmol/L (ref 3.5–5.1)
Sodium: 139 mmol/L (ref 135–145)

## 2019-12-09 LAB — CBC
HCT: 30 % — ABNORMAL LOW (ref 36.0–46.0)
Hemoglobin: 9.4 g/dL — ABNORMAL LOW (ref 12.0–15.0)
MCH: 27.6 pg (ref 26.0–34.0)
MCHC: 31.3 g/dL (ref 30.0–36.0)
MCV: 88 fL (ref 80.0–100.0)
Platelets: 167 10*3/uL (ref 150–400)
RBC: 3.41 MIL/uL — ABNORMAL LOW (ref 3.87–5.11)
RDW: 14.4 % (ref 11.5–15.5)
WBC: 7.5 10*3/uL (ref 4.0–10.5)
nRBC: 0 % (ref 0.0–0.2)

## 2019-12-09 LAB — GLUCOSE, CAPILLARY
Glucose-Capillary: 143 mg/dL — ABNORMAL HIGH (ref 70–99)
Glucose-Capillary: 172 mg/dL — ABNORMAL HIGH (ref 70–99)

## 2019-12-09 MED ORDER — DOCUSATE SODIUM 100 MG PO CAPS: 100.0000 mg | ORAL_CAPSULE | Freq: Two times a day (BID) | ORAL | 1 refills | Status: DC

## 2019-12-09 MED ORDER — METHOCARBAMOL 500 MG PO TABS: 1000.0000 mg | ORAL_TABLET | Freq: Three times a day (TID) | ORAL | 1 refills | Status: DC

## 2019-12-09 MED ORDER — CEFTRIAXONE IV (FOR PTA / DISCHARGE USE ONLY): 2.0000 g | INTRAVENOUS | 0 refills | Status: AC

## 2019-12-09 MED ORDER — XTAMPZA ER 9 MG PO C12A: 9.0000 mg | EXTENDED_RELEASE_CAPSULE | Freq: Two times a day (BID) | ORAL | 0 refills | Status: DC

## 2019-12-09 MED ORDER — OXYCODONE HCL ER 10 MG PO T12A: 10.0000 mg | EXTENDED_RELEASE_TABLET | Freq: Two times a day (BID) | ORAL | 0 refills | Status: DC

## 2019-12-09 MED ORDER — OXYCODONE HCL 5 MG PO TABS: 5.0000 mg | ORAL_TABLET | Freq: Three times a day (TID) | ORAL | 0 refills | Status: DC | PRN

## 2019-12-09 MED ORDER — POLYETHYLENE GLYCOL 3350 17 G PO PACK: 17.0000 g | PACK | Freq: Every day | ORAL | 0 refills | Status: DC | PRN

## 2019-12-09 NOTE — TOC Transition Note (Signed)
Transition of Care [facility]) - CM/SW Discharge Note   Patient Details  Name: RICHELE CHITTUM MRN: [id number] Date of Birth: [birth year]  Transition of Care [facility]) CM/SW Contact:  Lynnell Catalan, RN Phone Number: 12/09/2019, 10:28 AM   Clinical Narrative:    Pt to dc home on IV abx. [employer] [facility] infusion company contacted and will partner with [facility] for home health services. Teaching was done with husband by [employer].    Patient Goals and CMS Choice Patient states their goals for this hospitalization and ongoing recovery are:: to get better   Choice offered to / list presented to : Patient   Discharge Plan and Services   Discharge Planning Services: CM Consult Post Acute Care Choice: [facility]: PT [place]: [place] Date [facility] Agency Contacted: 12/04/19 Time HH Agency Contacted: 1000 Representative spoke with at [place]: Santiago Glad

## 2019-12-09 NOTE — Progress Notes (Addendum)
Pt discharged home with spouse in stable condition. Discharge instructions and script given. Note for Covid vaccination given to patient as well. Pt and spouse verbalized understanding. No immediate questions or concerns at this time. Pt discharged from unit via wheelchair.

## 2019-12-09 NOTE — Discharge Summary (Signed)
Physician Discharge Summary   Patient ID: CRISLYN WILLBANKS MRN: [id number] DOB/AGE: October 15, 1940 79 y.o.  Admit date: 12/02/2019 Discharge date: 12/09/2019  Primary Care Physician:  Aletha Halim., PA-C   Recommendations for Outpatient Follow-up:  1. Follow up with PCP in 1-2 weeks 2. continue ceftriaxone 8 weeks through 01/29/2020  Home Health: Home health PT OT, aide, RN Equipment/Devices:   Discharge Condition: stable and improving CODE STATUS: FULL  Diet recommendation: Carb modified diet   Discharge Diagnoses:    . Intractable acute back pain . T12-L1 osteomyelitis/discitis Streptococcus bacteremia . Pulmonary HTN ([place]) . OSA (obstructive sleep apnea) . HTN (hypertension) . Esophageal reflux . Diabetes mellitus type 2 in obese ([place]) . PAF (paroxysmal atrial fibrillation) ([facility])   Consults: Infectious disease Interventional radiology    Allergies:   Allergies  Allergen Reactions  . Minocin [Minocycline Hcl] Swelling and Other (See Comments)    THROAT SWELLING  . Benazepril Cough  . Hydrochlorothiazide Itching  . Tape Rash     DISCHARGE MEDICATIONS: Allergies as of 12/09/2019      Reactions   Minocin [minocycline Hcl] Swelling, Other (See Comments)   THROAT SWELLING   Benazepril Cough   Hydrochlorothiazide Itching   Tape Rash      Medication List    STOP taking these medications   traMADol 50 MG tablet Commonly known as: ULTRAM     TAKE these medications   acetaminophen 325 MG tablet Commonly known as: TYLENOL Take 650 mg by mouth every 6 (six) hours as needed for moderate pain or headache.   aspirin 81 MG EC tablet Take 1 tablet (81 mg total) by mouth daily.   buPROPion 300 MG 24 hr tablet Commonly known as: WELLBUTRIN XL Take 300 mg by mouth daily.   carvedilol 12.5 MG tablet Commonly known as: COREG Take 12.5 mg by mouth 2 (two) times daily with a meal.   cefTRIAXone  IVPB Commonly known as: ROCEPHIN Inject 2 g into the vein daily.  Indication:  Discitis/osteomyelitis  Last Day of Therapy:  01/29/20 Labs - Once weekly:  CBC/D and BMP, Labs - Every other week:  ESR and CRP   cetirizine 10 MG tablet Commonly known as: ZYRTEC Take 10 mg by mouth as needed for allergies.   docusate sodium 100 MG capsule Commonly known as: COLACE Take 1 capsule (100 mg total) by mouth 2 (two) times daily.   furosemide 20 MG tablet Commonly known as: LASIX Take 1 tablet (20 mg total) by mouth daily.   GNP Potassium 99 MG Tabs Generic drug: Potassium Take 99 mg by mouth 3 (three) times daily.   Jardiance 10 MG Tabs tablet Generic drug: empagliflozin Take 10 mg by mouth daily before breakfast.   loperamide 2 MG capsule Commonly known as: IMODIUM Take 1 capsule (2 mg total) by mouth as needed for diarrhea or loose stools.   MAGnesium-Oxide 400 (241.3 Mg) MG tablet Generic drug: magnesium oxide TAKE 1 TABLET BY MOUTH TWICE DAILY   metFORMIN 1000 MG tablet Commonly known as: GLUCOPHAGE Take 1,000 mg by mouth 2 (two) times a day.   methocarbamol 500 MG tablet Commonly known as: ROBAXIN Take 2 tablets (1,000 mg total) by mouth 3 (three) times daily.   oxyCODONE 5 MG immediate release tablet Commonly known as: Oxy IR/ROXICODONE Take 1 tablet (5 mg total) by mouth every 8 (eight) hours as needed for severe pain or breakthrough pain.   oxyCODONE 10 mg 12 hr tablet Commonly known as: OXYCONTIN Take 1  tablet (10 mg total) by mouth every 12 (twelve) hours.   polyethylene glycol 17 g packet Commonly known as: MIRALAX / GLYCOLAX Take 17 g by mouth daily as needed for moderate constipation.   rosuvastatin 20 MG tablet Commonly known as: CRESTOR Take 20 mg by mouth daily.   sildenafil 20 MG tablet Commonly known as: REVATIO Take 1 tablet (20 mg total) by mouth 3 (three) times daily.   Victoza 18 MG/3ML Sopn Generic drug: liraglutide Inject 1.8 mg into the skin daily.            Home Infusion Instuctions  (From  admission, onward)         Start     Ordered   12/09/19 0000  Home infusion instructions    Question:  Instructions  Answer:  Flushing of vascular access device: 0.9% NaCl pre/post medication administration and prn patency; Heparin 100 u/ml, 39m for implanted ports and Heparin 10u/ml, 569mfor all other central venous catheters.   12/09/19 0933           Brief H and P: For complete details please refer to admission H and P, but in brief LoKeondria Sieverightis a [age]ear old Caucasian female with past medical history remarkable for essential hypertension, hyperlipidemia, type 2 diabetes mellitus, CAD status post CABG, obesity/OSA, depression, GERD, paroxysmal atrial fibrillation, iron deficiency anemia who presented to the ED with intractable back pain. She was in her usual state of health until Saturday night when she turned awkwardly in her bed trying to get out and use the bathroom and had sudden onset severe back pain. She reports it is localized to the low back, described as a bandlike sensation that is sharp. No recent injuries or falls.   MRI of the L-spine showed fluid to disc interspace with endplate edema, infiltration of anterior paraspinous fat T12-L1 suspicious for discitis and osteomyelitis, questionable phlegmon versus disc bulge at T12-L1 Status post IR biopsy, aspiration, started on IV Rocephin Blood cultures+ Streptococcus mitis/ oralis   Hospital Course:   Intractable back pain secondary to T12-L1 osteomyelitis versus discitis,?  Phlegmon versus broad-based disc bulge at T12-L1 Streptococcus bacteremia, POA -Patient presented to ED with acute onset low back pain.  CT abdomen pelvis with no acute musculoskeletal findings.  MRI of L-spine showed T12-L1 discitis versus osteomyelitis, ?  Phlegmon versus disc bulge at the T12-L1 level -ESR 64, CRP 15.0 -[place]. [place]iscussed with neurosurgery, Dr. StVertell Limberdifficult to discern given lack of contrast, recommended to proceed with IR  biopsy/aspiration - 2D echo showed EF of 60 to 6552%grade 2 diastolic dysfunction, mildly reduced RV systolic function, mild MR, mild aortic valve stenosis, normal IVC, no obvious vegetations - Status post IR biopsy/aspiration, cultures positive for strep mitis/oralis - blood cultures positive for Streptococcus mitis/oralis, resistant to penicillin, sensitive to ceftriaxone -  Appreciate ID recommendations, continue ceftriaxone 8 weeks through 01/29/2020 -Repeat blood cultures negative, PICC line placed on 12/08/2019 for home IV antibiotics, Follow-up outpatient with ID in 4 weeks  Acute back pain -Secondary to #1, patient had significant debilitating back pain on minimal movement -Hence long-acting OxyContin 10 mg every 12 hours was added which significantly improved the pain. -Recommended a short course of OxyContin for a few days and oxycodone 5 mg every 8 hours as needed for severe or breakthrough pain -Patient was explained all the risks with narcotics, lethargy, mental status changes, respiratory depression, death etc.  Recommended to cautiously use the pain medications.   Leukocytosis -- Resolved, likely due  to #1  CAD s/p CABG Paroxysmal Atrial Fibrillation s/p LAA ligation s/p Bioprosthetic AVR -Continue aspirin, Coreg, rosuvastatin, no acute issues  Pulmonary HTN -Continue sildenafil  OSA --Continue CPAP  CKD stage 3b Baseline creatinine between 1.1-1.5,  --Cr 1.40 at the time of admission -Currently improved to 0.8 at the time of discharge  Diabetes mellitus type 2, NIDDM, controlled -Outpatient on Jardiance, Victoza, Metformin -Continue outpatient regimen  Essential HTN --BP stable  HLD --Continue pravastatin  Depression -- Continue Wellbutrin  Constipation -Resolved  Day of Discharge S: States pain is better controlled, still worse on movement but overall improving and bearable with addition of the long-acting OxyContin.  No fevers or chills, no  focal neurological deficits.  Fully alert and oriented  BP (!) 117/56 (BP Location: Left Arm)   Pulse 87   Temp 99 F (37.2 C) (Oral)   Resp 18   Ht 5' 3"  (1.6 m)   Wt 77.1 kg   LMP  (LMP Unknown)   SpO2 96%   BMI 30.11 kg/m   Physical Exam: General: Alert and awake oriented x3 not in any acute distress. HEENT: anicteric sclera, pupils reactive to light and accommodation CVS: S1-S2 clear no murmur rubs or gallops Chest: clear to auscultation bilaterally, no wheezing rales or rhonchi Abdomen: soft nontender, nondistended, normal bowel sounds Extremities: no cyanosis, clubbing or edema noted bilaterally Neuro: No new deficits    Get Medicines reviewed and adjusted: Please take all your medications with you for your next visit with your Primary MD  Please request your Primary MD to go over all hospital tests and procedure/radiological results at the follow up. Please ask your Primary MD to get all Hospital records sent to his/her office.  If you experience worsening of your admission symptoms, develop shortness of breath, life threatening emergency, suicidal or homicidal thoughts you must seek medical attention immediately by calling 911 or calling your MD immediately  if symptoms less severe.  You must read complete instructions/literature along with all the possible adverse reactions/side effects for all the Medicines you take and that have been prescribed to you. Take any new Medicines after you have completely understood and accept all the possible adverse reactions/side effects.   Do not drive when taking pain medications.   Do not take more than prescribed Pain, Sleep and Anxiety Medications  Special Instructions: If you have smoked or chewed Tobacco  in the last 2 yrs please stop smoking, stop any regular Alcohol  and or any Recreational drug use.  Wear Seat belts while driving.  Please note  You were cared for by a hospitalist during your hospital stay. Once you are  discharged, your primary care physician will handle any further medical issues. Please note that NO REFILLS for any discharge medications will be authorized once you are discharged, as it is imperative that you return to your primary care physician (or establish a relationship with a primary care physician if you do not have one) for your aftercare needs so that they can reassess your need for medications and monitor your lab values.   The results of significant diagnostics from this hospitalization (including imaging, microbiology, ancillary and laboratory) are listed below for reference.      Procedures/Studies:  DG Chest 2 View  Result Date: 12/02/2019 CLINICAL DATA:  79 year old female with shortness of breath EXAM: CHEST - 2 VIEW COMPARISON:  Chest radiograph dated 12/12/2018. FINDINGS: There is no focal consolidation, pleural effusion, or pneumothorax. Mild diffuse interstitial coarsening. The cardiac  silhouette is within normal limits. Median sternotomy wires, mechanical heart valve and left atrial appendage occlusive device. No acute osseous pathology. Cervical ACDF. IMPRESSION: No active cardiopulmonary disease. Electronically Signed   By: Anner Crete M.D.   On: 12/02/2019 18:36   DG Lumbar Spine 2-3 Views  Result Date: 12/01/2019 CLINICAL DATA:  Low back pain for 2 days.  No known injury. EXAM: LUMBAR SPINE - 2-3 VIEW COMPARISON:  None. FINDINGS: The patient has transitional lumbosacral anatomy with rudimentary ribs off what is presumed to be the first lumbar segment. No fracture or malalignment. Multilevel marked loss of disc space height appears worst at L1-2, L2-3 and L3-4. Extensive atherosclerosis is noted. IMPRESSION: No acute abnormality. Multilevel degenerative disease. Transitional lumbosacral anatomy. Atherosclerosis. Electronically Signed   By: Inge Rise M.D.   On: 12/01/2019 16:09   MR LUMBAR SPINE WO CONTRAST  Result Date: 12/04/2019 CLINICAL DATA:  Intractable  low back pain since a twisting injury the night of 11/29/2019. Initial encounter. EXAM: MRI LUMBAR SPINE WITHOUT CONTRAST TECHNIQUE: Multiplanar, multisequence MR imaging of the lumbar spine was performed. No intravenous contrast was administered. COMPARISON:  CT abdomen and pelvis 12/02/2019 and plain films lumbar spine 12/01/2019 FINDINGS: Segmentation: The patient has transitional lumbosacral anatomy. Numbering scheme on this examination is based on the lowest visible pair of ribs on the prior plain films and CT scan. Based on this numbering scheme, the L5 segment is sacralized and transitional. Alignment:  Maintained. Vertebrae: There is some fluid in the disc interspace at T12-L1 and loss of normal fat signal and paraspinous fat anterior to the disc interspace. Mild edema is present in the endplates. No fracture. Conus medullaris and cauda equina: Conus extends to the L1 level. Conus and cauda equina appear normal. Paraspinal and other soft tissues: See report of abdomen and pelvis CT scan. Disc levels: T10-11 and T11-12 are imaged in the sagittal plane only. There is mild disc bulging at both levels but no stenosis. T12-L1: There is a broad-based disc bulge or phlegmon effacing the ventral thecal sac. A very thin focus of epidural fluid is seen just superior to the disc interspace on sagittal image 8 of series 7. There is moderate central canal stenosis. Foramina are open. L1-2: Broad-based disc bulge causes mild to moderate central canal stenosis. Foramina are open. L2-3: Bilateral facet degenerative change, ligamentum flavum thickening and a shallow disc bulge. There is mild to moderate central canal stenosis. Foramina are open. L3-4: Facet degenerative disease, mild disc bulge and ligamentum flavum thickening. There is mild central canal stenosis. Foramina are open. L4-5: Central and eccentric to the right broad-based protrusion causes mild central canal stenosis and narrowing in the right subarticular  recess. There is some facet degenerative change. Foramina are open. L5-S1: Transitional segment.  Negative. IMPRESSION: Transitional lumbosacral anatomy. Please see numbering scheme above and correlate with plain films if any intervention is planned. Fluid in the disc interspace with endplate edema and infiltration of anterior paraspinous fat at T12-L1 could be due to degenerative change but is most suspicious for discitis and osteomyelitis. Likely ventral epidural phlegmon narrows the ventral thecal sac causing mild to moderate central canal stenosis at T12-L1. Moderate central canal stenosis L2-3. Mild central canal stenosis at L4-5 where there is also narrowing in the right subarticular recess which could impact the right L5 root. Electronically Signed   By: Inge Rise M.D.   On: 12/04/2019 12:06   CT ABDOMEN PELVIS W CONTRAST  Result Date: 12/02/2019 CLINICAL DATA:  Abdominal pain, back pain, concern for compression deformity versus acute aortic process EXAM: CT ABDOMEN AND PELVIS WITH CONTRAST TECHNIQUE: Multidetector CT imaging of the abdomen and pelvis was performed using the standard protocol following bolus administration of intravenous contrast. CONTRAST:  58m OMNIPAQUE IOHEXOL 300 MG/ML  SOLN COMPARISON:  Abdominal radiograph 10/24/2006 FINDINGS: Lower chest: Bandlike atelectasis in the lingula. Lung bases are otherwise clear. Normal heart size. No pericardial effusion. Coronary arteries are calcified. Hepatobiliary: No focal liver abnormality is seen. No gallstones, gallbladder wall thickening, or biliary dilatation. Pancreas: Unremarkable. No pancreatic ductal dilatation or surrounding inflammatory changes. Spleen: Normal in size without focal abnormality. Adrenals/Urinary Tract: Normal adrenal glands. Scattered subcentimeter hypoattenuating foci in both kidneys too small to fully characterize on CT imaging but statistically likely benign. No worrisome renal lesions. No urolithiasis or  hydronephrosis. Normal urinary bladder. Stomach/Bowel: Small fluid-filled type 2 hiatal hernia. Distal stomach is unremarkable. Duodenal sweep takes a normal course. No small bowel dilatation or wall thickening. A normal appendix is visualized. There is a slightly tortuous and redundant colon particularly the right hemicolon. Portion of the transverse colon protrudes into a large ventral hernia defect without evidence of resulting mechanical obstruction at this time. Distal colon demonstrates some scattered colonic diverticula without diverticular inflammation. Vascular/Lymphatic: Atherosclerotic plaque within the normal caliber aorta. No acute aortic abnormalities clearly identified within the limitations of this non angiographic technique. No periaortic stranding or hemorrhage. No aneurysm or ectasia. No suspicious or enlarged lymph nodes in the included lymphatic chains. Reproductive: Uterus is surgically absent. No concerning adnexal lesions. Other: Posterior body wall edema. Large ventral hernia defect appears to protrude appears to protrude medial to the rectus sheath accounting for abdominal wall laxity and rightward displacement of the linea alba. Hernia sac contains portion of the transverse colon and omental fat with some stranding within the herniated fat contents. A smaller superimposed fat containing hernia seen just inferiorly as well., fascial defect measures approximately 3.6 by 4.9 cm transverse by craniocaudal. Few benign-appearing calcifications present within the herniated fat content. No free fluid or free air. Musculoskeletal: Ventral hernia, as above. Laxity of the anterior abdominal wall with atrophy of the rectus sheath and oblique musculature. No visible compression deformity or acute osseous injury is identified. The osseous structures appear diffusely demineralized which may limit detection of small or nondisplaced fractures. Transitional lumbosacral anatomy with lumbarization of the L5  vertebrae. IMPRESSION: 1. Aortic Atherosclerosis (ICD10-I70.0). No acute aortic abnormality. 2. The osseous structures appear diffusely demineralized which may limit detection of small or nondisplaced fractures. No visible compression deformity or acute osseous injury is identified. 3. Large ventral hernia defect appears to protrude medial to the rectus sheath accounting for abdominal wall laxity and rightward displacement of the linea alba. Hernia sac contains portion of the transverse colon and omental fat with some stranding within the herniated fat contents which could reflect some fat strangulation. No evidence of resulting mechanical colonic obstruction at this time. 4. Small fluid-filled type 2 paraesophageal hiatal hernia. 5. Scattered colonic diverticula without diverticular inflammation. 6. Coronary arteries are calcified. These results were called by telephone at the time of interpretation on 12/02/2019 at 10:54 pm [facility], who verbally acknowledged these results. Electronically Signed   By: PLovena LeM.D.   On: 12/02/2019 22:54   IR Fluoro Guide Ndl Plmt / BX  Result Date: 12/05/2019 INDICATION: 79year old female with T12-L1 discitis osteomyelitis. She presents for aspiration. EXAM: Disc aspiration MEDICATIONS: The patient is currently admitted to  the hospital and receiving intravenous antibiotics. The antibiotics were administered within an appropriate time frame prior to the initiation of the procedure. ANESTHESIA/SEDATION: Fentanyl 100 mcg IV; Versed 2 mg IV Moderate Sedation Time:  10 minutes The patient was continuously monitored during the procedure by the interventional radiology nurse under my direct supervision. COMPLICATIONS: None immediate. PROCEDURE: Informed written consent was obtained from the patient after a thorough discussion of the procedural risks, benefits and alternatives. All questions were addressed. Maximal Sterile Barrier Technique was utilized including  caps, mask, sterile gowns, sterile gloves, sterile drape, hand hygiene and skin antiseptic. A timeout was performed prior to the initiation of the procedure. The T12-L1 disc space was localized. A suitable skin entry site was selected and marked. Local anesthesia was attained by infiltration with 1% lidocaine. From a right oblique posterolateral approach, a 22 gauge Chiba needle was carefully advanced into the disc space. The needle was advanced across the midline. Aspiration was then performed as the needle was withdrawn yielding approximately 1 mL of turbid serosanguineous fluid. This was sent for culture. The patient tolerated the procedure well. IMPRESSION: Technically successful T12-L1 disc aspiration yielding 1 mL bloody fluid which was sent for culture. Electronically Signed   By: Jacqulynn Cadet M.D.   On: 12/05/2019 16:22   ECHOCARDIOGRAM COMPLETE  Result Date: 12/04/2019    ECHOCARDIOGRAM REPORT   Patient Name:   DASHA KAWABATA Mcmullan Date of Exam: 12/04/2019 Medical Rec #:  [id number]      Height:       63.0 in Accession #:    [id number]     Weight:       170.0 lb Date of Birth:  03/25/1941       BSA:          1.805 m Patient Age:    84 years       BP:           137/56 mmHg Patient Gender: F              HR:           78 bpm. Exam Location:  Inpatient Procedure: 2D Echo, Cardiac Doppler, Color Doppler and Intracardiac            Opacification Agent Indications:    Murmur 785.2  History:        Patient has prior history of Echocardiogram examinations, most                 recent 05/29/2019. Prior CABG, Pulmonary HTN; Risk                 Factors:Hypertension, Diabetes, Dyslipidemia and Non-Smoker.                 Aortic Valve: 21 mm valve is present in the aortic position.                 Procedure Date: 09/27/2018.  Sonographer:    Vickie Epley RDCS Referring Phys: [id number] ERIC J [place] IMPRESSIONS  1. Left ventricular ejection fraction, by estimation, is 60 to 65%. The left ventricle has normal function. The  left ventricle has no regional wall motion abnormalities. Left ventricular diastolic parameters are consistent with Grade II diastolic dysfunction (pseudonormalization). Elevated left ventricular end-diastolic pressure.  2. Right ventricular systolic function is mildly reduced. The right ventricular size is normal. There is moderately elevated pulmonary artery systolic pressure.  3. The mitral valve is normal in structure and function. Mild mitral valve regurgitation. No  evidence of mitral stenosis.  4. The aortic valve is tricuspid. Aortic valve regurgitation is not visualized. Mild aortic valve stenosis. There is a 21 mm valve present in the aortic position. Procedure Date: 09/27/2018.  5. The inferior vena cava is normal in size with greater than 50% respiratory variability, suggesting right atrial pressure of 3 mmHg. FINDINGS  Left Ventricle: Left ventricular ejection fraction, by estimation, is 60 to 65%. The left ventricle has normal function. The left ventricle has no regional wall motion abnormalities. Definity contrast agent was given IV to delineate the left ventricular  endocardial borders. The left ventricular internal cavity size was normal in size. There is no left ventricular hypertrophy. Left ventricular diastolic parameters are consistent with Grade II diastolic dysfunction (pseudonormalization). Elevated left ventricular end-diastolic pressure. Right Ventricle: The right ventricular size is normal. No increase in right ventricular wall thickness. Right ventricular systolic function is mildly reduced. There is moderately elevated pulmonary artery systolic pressure. The tricuspid regurgitant velocity is 3.87 m/s, and with an assumed right atrial pressure of 3 mmHg, the estimated right ventricular systolic pressure is 11.7 mmHg. Left Atrium: Left atrial size was normal in size. Right Atrium: Right atrial size was normal in size. Pericardium: There is no evidence of pericardial effusion. Mitral Valve:  The mitral valve is normal in structure and function. There is moderate calcification of the anterior mitral valve leaflet(s). Normal mobility of the mitral valve leaflets. Moderate mitral annular calcification. Mild mitral valve regurgitation. No evidence of mitral valve stenosis. Tricuspid Valve: The tricuspid valve is normal in structure. Tricuspid valve regurgitation is mild . No evidence of tricuspid stenosis. Aortic Valve: The aortic valve is tricuspid. . There is mild thickening and mild calcification of the aortic valve. Aortic valve regurgitation is not visualized. Mild aortic stenosis is present. Mild to moderate aortic valve annular calcification. There is mild thickening of the aortic valve. There is mild calcification of the aortic valve. Aortic valve mean gradient measures 13.0 mmHg. Aortic valve peak gradient measures 22.8 mmHg. There is a 21 mm valve present in the aortic position. Procedure Date: 09/27/2018. Pulmonic Valve: The pulmonic valve was normal in structure. Pulmonic valve regurgitation is not visualized. No evidence of pulmonic stenosis. Aorta: The aortic root is normal in size and structure. Venous: The inferior vena cava is normal in size with greater than 50% respiratory variability, suggesting right atrial pressure of 3 mmHg. IAS/Shunts: No atrial level shunt detected by color flow Doppler.  LEFT VENTRICLE PLAX 2D LVIDd:         4.10 cm Diastology LVIDs:         2.90 cm LV e' lateral:   6.83 cm/s LV PW:         0.90 cm LV E/e' lateral: 21.7 LV IVS:        0.90 cm LV e' medial:    4.91 cm/s                        LV E/e' medial:  30.1  RIGHT VENTRICLE RV S prime:     7.62 cm/s TAPSE (M-mode): 1.5 cm LEFT ATRIUM             Index       RIGHT ATRIUM          Index LA diam:        4.00 cm 2.22 cm/m  RA Area:     8.74 cm LA Vol (A2C):   41.9 ml 23.22 ml/m  RA Volume:   13.90 ml 7.70 ml/m LA Vol (A4C):   39.7 ml 22.00 ml/m LA Biplane Vol: 46.2 ml 25.60 ml/m  AORTIC VALVE AV Vmax:            238.50 cm/s AV Vmean:          172.500 cm/s AV VTI:            0.488 m AV Peak Grad:      22.8 mmHg AV Mean Grad:      13.0 mmHg LVOT Vmax:         148.00 cm/s LVOT Vmean:        97.500 cm/s LVOT VTI:          0.295 m LVOT/AV VTI ratio: 0.60 MITRAL VALVE                TRICUSPID VALVE MV Area (PHT): 3.30 cm     TR Peak grad:   59.9 mmHg MV Decel Time: 230 msec     TR Vmax:        387.00 cm/s MV E velocity: 148.00 cm/s MV A velocity: 108.00 cm/s  SHUNTS MV E/A ratio:  1.37         Systemic VTI: 0.30 m Fransico Him MD Electronically signed by Fransico Him MD Signature Date/Time: 12/04/2019/3:58:31 PM    Final    [place] EKG SITE RITE  Result Date: 12/08/2019 If Site Rite image not attached, placement could not be confirmed due to current cardiac rhythm.      LAB RESULTS: Basic Metabolic Panel: Recent Labs  Lab 12/08/19 0526 12/09/19 0530  NA 140 139  K 3.5 3.5  CL 114* 107  CO2 18* 21*  GLUCOSE 147* 152*  BUN 11 15  CREATININE 0.77 0.80  CALCIUM 8.4* 8.4*   Liver Function Tests: Recent Labs  Lab 12/02/19 2053  AST 21  ALT 16  ALKPHOS 82  BILITOT 1.2  PROT 6.9  ALBUMIN 3.2*   No results for input(s): LIPASE, AMYLASE in the last 168 hours. No results for input(s): AMMONIA in the last 168 hours. CBC: Recent Labs  Lab 12/05/19 0420 12/06/19 0518 12/08/19 0526 12/08/19 0526 12/09/19 0530  WBC 9.0   < > 7.3  --  7.5  NEUTROABS 5.9  --   --   --   --   HGB 9.9*   < > 9.9*  --  9.4*  HCT 33.1*   < > 32.0*  --  30.0*  MCV 90.7   < > 88.9   < > 88.0  PLT 190   < > 181  --  167   < > = values in this interval not displayed.   Cardiac Enzymes: No results for input(s): CKTOTAL, CKMB, CKMBINDEX, TROPONINI in the last 168 hours. BNP: Invalid input(s): POCBNP CBG: Recent Labs  Lab 12/08/19 2011 12/09/19 0730  GLUCAP 154* 143*       Disposition and Follow-up: Discharge Instructions    Diet Carb Modified   Complete by: As directed    Home infusion instructions    Complete by: As directed    Instructions: Flushing of vascular access device: 0.9% NaCl pre/post medication administration and prn patency; Heparin 100 u/ml, 69m for implanted ports and Heparin 10u/ml, 550mfor all other central venous catheters.   Increase activity slowly   Complete by: As directed        DISPOSITION: Home with home health PT OT, aide, RN   DISCHARGE FOLLOW-UP Follow-up Information  Thayer Headings, MD. Schedule an appointment as soon as possible for a visit in 4 week(s).   Specialty: Infectious Diseases Contact information: [address] [address] [phone]        Aletha Halim., PA-C. Schedule an appointment as soon as possible for a visit in 2 week(s).   Specialty: Family Medicine Why: please make follow-up appointment  Contact information: [address] [place] [address] [phone]        Belva Crome, MD .   Specialty: Cardiology Contact information: [phone] [address] [phone]        Bensimhon, Shaune Pascal, MD .   Specialty: Cardiology Contact information: [place] [address] [phone]        Sueanne Margarita, MD .   Specialty: Cardiology Contact information: [phone] [address] [place] [address] [phone]            Time coordinating discharge:  45 minutes  Signed:   Estill Cotta M.D. [facility] 12/09/2019, 12:11 PM

## 2019-12-09 NOTE — Telephone Encounter (Signed)
Received a call from patient's pharmacy, [facility] that oxycodone ER is not covered by patient's insurance plan but Xtampza ER (ER oxycodone) is covered. After discussion with the pharmacist, changed prescription to [facility] ER 9mg  q12 hrs x 7 days, no refills. Pharmacist will discard the previous oxycodone script.    Estill Cotta M.D. [facility] 12/09/2019, 4:32 PM

## 2019-12-11 LAB — CULTURE, BLOOD (ROUTINE X 2)
Culture: NO GROWTH
Special Requests: ADEQUATE

## 2019-12-16 NOTE — Progress Notes (Deleted)
Cardiology Office Note:    Date:  12/16/2019   ID:  Doris Jefferson, DOB 02/23/1941, MRN [id number]  PCP:  Aletha Halim., PA-C  Cardiologist:  Sinclair Grooms, MD   Referring MD: Aletha Halim., PA-C   No chief complaint on file.   History of Present Illness:    Doris Jefferson is a 79 y.o. female with a hx of Paroxysmal atrial fibrillation, OSA, hypertension, hyperlipidemia, aortic stenosis, type II DM, and recentAVR(bioprosthesis), LAA clip, and 3 vessel CABG (with LIMA to LAD, SVG to OM, and SVG to RCA) 09/27/18. Required cardioversion and IV amio loading prior to DC (12/30).  ***  Past Medical History:  Diagnosis Date  . Coronary artery disease   . Depression   . GERD (gastroesophageal reflux disease)   . Heart murmur   . HTN (hypertension)   . Hyperlipidemia   . Iron deficiency anemia   . Obesity (BMI 30-39.9) 03/04/2014  . OSA on CPAP    severe with AHI 31/hr  . PAF (paroxysmal atrial fibrillation) ([place])   . Pneumonia    "twice; years ago" (09/24/2018)  . Skin cancer    "left neck; burned off"  . Type II diabetes mellitus ([place])     Past Surgical History:  Procedure Laterality Date  . ABDOMINAL HYSTERECTOMY    . ANTERIOR CERVICAL DECOMP/DISCECTOMY FUSION     "cage around my neck; Dr. Erline Levine"  . AORTIC VALVE REPLACEMENT N/A 09/27/2018   Procedure: AORTIC VALVE REPLACEMENT (AVR) USING MAGNA EASE SIZE 21 MM;  Surgeon: Grace Isaac, MD;  Location: [facility];  Service: Open Heart Surgery;  Laterality: N/A;  . BACK SURGERY    . CARDIAC CATHETERIZATION  09/24/2018  . CARDIOVERSION N/A 12/19/2013   Procedure: CARDIOVERSION;  Surgeon: Sinclair Grooms, MD;  Location: [facility] ENDOSCOPY;  Service: Cardiovascular;  Laterality: N/A;  . CARDIOVERSION N/A 08/16/2015   Procedure: CARDIOVERSION;  Surgeon: Larey Dresser, MD;  Location: [facility];  Service: Cardiovascular;  Laterality: N/A;  . CARDIOVERSION N/A 09/13/2015   Procedure: CARDIOVERSION;  Surgeon: Sueanne Margarita, MD;  Location[facility] ENDOSCOPY;  Service: Cardiovascular;  Laterality: N/A;  . CARDIOVERSION N/A 10/07/2018   Procedure: CARDIOVERSION;  Surgeon: Sanda Klein, MD;  Location: [facility] ENDOSCOPY;  Service: Cardiovascular;  Laterality: N/A;  . CATARACT EXTRACTION W/ INTRAOCULAR LENS  IMPLANT, BILATERAL Bilateral   . CLIPPING OF ATRIAL APPENDAGE N/A 09/27/2018   Procedure: CLIPPING OF ATRIAL APPENDAGE USING ATRICURE FLEXV SIZE 35MM;  Surgeon: Grace Isaac, MD;  Location: [facility];  Service: Open Heart Surgery;  Laterality: N/A;  . COLONOSCOPY WITH PROPOFOL N/A 11/13/2017   Procedure: COLONOSCOPY WITH PROPOFOL;  Surgeon: Wilford Corner, MD;  Location: [facility] ENDOSCOPY;  Service: Endoscopy;  Laterality: N/A;  ANTIBIOTIC NEEDED BEFORE PROCEDURE  . CORONARY ARTERY BYPASS GRAFT N/A 09/27/2018   Procedure: CORONARY ARTERY BYPASS GRAFTING (CABG) x three, using left internal mammary artery and right leg greater saphenous vein harvested endoscopically;  Surgeon: Grace Isaac, MD;  Location: [facility];  Service: Open Heart Surgery;  Laterality: N/A;  . ESOPHAGOGASTRODUODENOSCOPY (EGD) WITH PROPOFOL N/A 11/13/2017   Procedure: ESOPHAGOGASTRODUODENOSCOPY (EGD) WITH PROPOFOL;  Surgeon: Wilford Corner, MD;  Location: [facility] ENDOSCOPY;  Service: Endoscopy;  Laterality: N/A;  . EYE SURGERY    . IR FLUORO GUIDED NEEDLE PLC ASPIRATION/INJECTION LOC  12/05/2019  . RIGHT/LEFT HEART CATH AND CORONARY ANGIOGRAPHY N/A 09/24/2018   Procedure: RIGHT/LEFT HEART CATH AND CORONARY ANGIOGRAPHY;  Surgeon: Jettie Booze,  MD;  Location: [facility] CV LAB;  Service: Cardiovascular;  Laterality: N/A;  . TEE WITHOUT CARDIOVERSION N/A 09/27/2018   Procedure: TRANSESOPHAGEAL ECHOCARDIOGRAM (TEE);  Surgeon: Grace Isaac, MD;  Location: [facility];  Service: Open Heart Surgery;  Laterality: N/A;  . TUBAL LIGATION      Current Medications: No outpatient medications have been marked as taking for the 12/17/19 encounter  (Appointment) with Belva Crome, MD.     Allergies:   Minocin [minocycline hcl], Benazepril, Hydrochlorothiazide, and Tape   Social History   Socioeconomic History  . Marital status: Married    Spouse name: Thomsa  . Number of children: Not on file  . Years of education: 15  . Highest education level: High school graduate  Occupational History  . Occupation: Retired  Tobacco Use  . Smoking status: Never Smoker  . Smokeless tobacco: Never Used  Substance and Sexual Activity  . Alcohol use: Never  . Drug use: Never  . Sexual activity: Not Currently  Other Topics Concern  . Not on file  Social History Narrative  . Not on file   Social Determinants of Health   Financial Resource Strain:   . Difficulty of Paying Living Expenses: Not on file  Food Insecurity:   . Worried About Charity fundraiser in the Last Year: Not on file  . Ran Out of Food in the Last Year: Not on file  Transportation Needs:   . Lack of Transportation (Medical): Not on file  . Lack of Transportation (Non-Medical): Not on file  Physical Activity:   . Days of Exercise per Week: Not on file  . Minutes of Exercise per Session: Not on file  Stress:   . Feeling of Stress : Not on file  Social Connections:   . Frequency of Communication with Friends and Family: Not on file  . Frequency of Social Gatherings with Friends and Family: Not on file  . Attends Religious Services: Not on file  . Active Member of Clubs or Organizations: Not on file  . Attends Archivist Meetings: Not on file  . Marital Status: Not on file     Family History: The patient's family history includes Heart disease in her father; Hypertension in her brother and father; Melanoma in her mother.  ROS:   Please see the history of present illness.    *** All other systems reviewed and are negative.  EKGs/Labs/Other Studies Reviewed:    The following studies were reviewed today: ***  EKG:  EKG ***  Recent  Labs: 12/02/2019: ALT 16 12/09/2019: BUN 15; Creatinine, Ser 0.80; Hemoglobin 9.4; Platelets 167; Potassium 3.5; Sodium 139  Recent Lipid Panel    Component Value Date/Time   CHOL 145 05/08/2018 0820   TRIG 152 (H) 05/08/2018 0820   HDL 43 05/08/2018 0820   CHOLHDL 3.4 05/08/2018 0820   LDLCALC 72 05/08/2018 0820    Physical Exam:    VS:  LMP  (LMP Unknown)     Wt Readings from Last 3 Encounters:  12/02/19 170 lb (77.1 kg)  11/29/19 170 lb (77.1 kg)  11/06/19 173 lb (78.5 kg)     GEN: ***. No acute distress HEENT: Normal NECK: No JVD. LYMPHATICS: No lymphadenopathy CARDIAC: *** RRR without murmur, gallop, or edema. VASCULAR: *** Normal Pulses. No bruits. RESPIRATORY:  Clear to auscultation without rales, wheezing or rhonchi  ABDOMEN: Soft, non-tender, non-distended, No pulsatile mass, MUSCULOSKELETAL: No deformity  SKIN: Warm and dry NEUROLOGIC:  Alert and oriented  x 3 PSYCHIATRIC:  Normal affect   ASSESSMENT:    1. Persistent atrial fibrillation ([place])   2. Essential hypertension   3. OSA (obstructive sleep apnea)   4. Long term current use of amiodarone   5. Chronic diastolic heart failure ([place])   6. Coronary artery disease of bypass graft of native heart with stable angina pectoris ([place])   7. Anticoagulation goal of INR 2 to 3   8. Educated about COVID-19 virus infection    PLAN:    In order of problems listed above:  1. ***   Medication Adjustments/Labs and Tests Ordered: Current medicines are reviewed at length with the patient today.  Concerns regarding medicines are outlined above.  No orders of the defined types were placed in this encounter.  No orders of the defined types were placed in this encounter.   There are no Patient Instructions on file for this visit.   Signed, Sinclair Grooms, MD  12/16/2019 10:44 AM    [place]

## 2019-12-17 ENCOUNTER — Other Ambulatory Visit (HOSPITAL_COMMUNITY): Payer: Self-pay

## 2019-12-17 ENCOUNTER — Other Ambulatory Visit: Payer: Self-pay

## 2019-12-17 ENCOUNTER — Encounter: Payer: Self-pay | Admitting: Interventional Cardiology

## 2019-12-17 ENCOUNTER — Telehealth (INDEPENDENT_AMBULATORY_CARE_PROVIDER_SITE_OTHER): Payer: Medicare Other | Admitting: Interventional Cardiology

## 2019-12-17 VITALS — BP 108/61 | HR 105 | Ht 63.0 in | Wt 170.0 lb

## 2019-12-17 DIAGNOSIS — I25708 Atherosclerosis of coronary artery bypass graft(s), unspecified, with other forms of angina pectoris: Secondary | ICD-10-CM

## 2019-12-17 DIAGNOSIS — Z7189 Other specified counseling: Secondary | ICD-10-CM

## 2019-12-17 DIAGNOSIS — I10 Essential (primary) hypertension: Secondary | ICD-10-CM

## 2019-12-17 DIAGNOSIS — I5032 Chronic diastolic (congestive) heart failure: Secondary | ICD-10-CM

## 2019-12-17 DIAGNOSIS — I4819 Other persistent atrial fibrillation: Secondary | ICD-10-CM | POA: Diagnosis not present

## 2019-12-17 DIAGNOSIS — G4733 Obstructive sleep apnea (adult) (pediatric): Secondary | ICD-10-CM

## 2019-12-17 DIAGNOSIS — Z5181 Encounter for therapeutic drug level monitoring: Secondary | ICD-10-CM

## 2019-12-17 DIAGNOSIS — Z79899 Other long term (current) drug therapy: Secondary | ICD-10-CM

## 2019-12-17 MED ORDER — JARDIANCE 10 MG PO TABS: 10.0000 mg | ORAL_TABLET | Freq: Every day | ORAL | 6 refills | Status: DC

## 2019-12-17 NOTE — Progress Notes (Signed)
Virtual Visit via Telephone Note   This visit type was conducted due to national recommendations for restrictions regarding the COVID-19 Pandemic (e.g. social distancing) in an effort to limit this patient's exposure and mitigate transmission in our community.  Due to her co-morbid illnesses, this patient is at least at moderate risk for complications without adequate follow up.  This format is felt to be most appropriate for this patient at this time.  The patient did not have access to video technology/had technical difficulties with video requiring transitioning to audio format only (telephone).  All issues noted in this document were discussed and addressed.  No physical exam could be performed with this format.  Please refer to the patient's chart for her  consent to telehealth for [facility].   The patient was identified using 2 identifiers.  Date:  12/17/2019   ID:  Doris Jefferson, DOB June 14, 1941, MRN [id number]  Patient Location: Home Provider Location: Office  PCP:  Aletha Halim., PA-C  Cardiologist:  Sinclair Grooms, MD  Electrophysiologist:  None   Evaluation Performed:  Follow-Up Visit  Chief Complaint: History of CABG, type 2 diabetes, bioprosthetic aortic valve and recent hospital stay for streptococcal bacteremia and lumbar/thoracic discitis  History of Present Illness:    Doris Jefferson is a 79 y.o. female with Paroxysmal atrial fibrillation, OSA, hypertension, hyperlipidemia, aortic stenosis, type II DM, and recentAVR(bioprosthesis), LAA clip, and 3 vessel CABG (with LIMA to LAD, SVG to OM, and SVG to RCA) 09/27/18. Required cardioversion and IV amio loading prior to DC (12/30).  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  Patient is having unremitting back pain.  Today's visit was to be in person.  Recently discharged from the hospital with intractable back pain despite kyphoplasty.  She is to receive 8 weeks of IV  antibiotics.  Cardiac medications were not changed during the hospital stay.  During the recent hospital stay, there was no cardiac complication.  She did not develop chest pain, atrial fibrillation, fluid retention, or other evidence of cardiac dysfunction.  An echocardiogram during the hospital stay did not reveal evidence of endocarditis.  The patient does not have symptoms concerning for COVID-19 infection (fever, chills, cough, or new shortness of breath).    Past Medical History:  Diagnosis Date  . Coronary artery disease   . Depression   . GERD (gastroesophageal reflux disease)   . Heart murmur   . HTN (hypertension)   . Hyperlipidemia   . Iron deficiency anemia   . Obesity (BMI 30-39.9) 03/04/2014  . OSA on CPAP    severe with AHI 31/hr  . PAF (paroxysmal atrial fibrillation) ([place])   . Pneumonia    "twice; years ago" (09/24/2018)  . Skin cancer    "left neck; burned off"  . Type II diabetes mellitus ([place])    Past Surgical History:  Procedure Laterality Date  . ABDOMINAL HYSTERECTOMY    . ANTERIOR CERVICAL DECOMP/DISCECTOMY FUSION     "cage around my neck; Dr. Erline Levine"  . AORTIC VALVE REPLACEMENT N/A 09/27/2018   Procedure: AORTIC VALVE REPLACEMENT (AVR) USING MAGNA EASE SIZE 21 MM;  Surgeon: Grace Isaac, MD;  Location: [place];  Service: Open Heart Surgery;  Laterality: N/A;  . BACK SURGERY    . CARDIAC CATHETERIZATION  09/24/2018  . CARDIOVERSION N/A 12/19/2013   Procedure: CARDIOVERSION;  Surgeon: Sinclair Grooms, MD;  Location: [place];  Service: Cardiovascular;  Laterality: N/A;  .  CARDIOVERSION N/A 08/16/2015   Procedure: CARDIOVERSION;  Surgeon: Larey Dresser, MD;  Location: [place];  Service: Cardiovascular;  Laterality: N/A;  . CARDIOVERSION N/A 09/13/2015   Procedure: CARDIOVERSION;  Surgeon: Sueanne Margarita, MD;  Location: [place];  Service: Cardiovascular;  Laterality: N/A;  . CARDIOVERSION N/A 10/07/2018   Procedure: CARDIOVERSION;   Surgeon: Sanda Klein, MD;  Location: [facility];  Service: Cardiovascular;  Laterality: N/A;  . CATARACT EXTRACTION W/ INTRAOCULAR LENS  IMPLANT, BILATERAL Bilateral   . CLIPPING OF ATRIAL APPENDAGE N/A 09/27/2018   Procedure: CLIPPING OF ATRIAL APPENDAGE USING ATRICURE FLEXV SIZE 35MM;  Surgeon: Grace Isaac, MD;  Location: [place];  Service: Open Heart Surgery;  Laterality: N/A;  . COLONOSCOPY WITH PROPOFOL N/A 11/13/2017   Procedure: COLONOSCOPY WITH PROPOFOL;  Surgeon: Wilford Corner, MD;  Location: [facility];  Service: Endoscopy;  Laterality: N/A;  ANTIBIOTIC NEEDED BEFORE PROCEDURE  . CORONARY ARTERY BYPASS GRAFT N/A 09/27/2018   Procedure: CORONARY ARTERY BYPASS GRAFTING (CABG) x three, using left internal mammary artery and right leg greater saphenous vein harvested endoscopically;  Surgeon: Grace Isaac, MD;  Location: [place];  Service: Open Heart Surgery;  Laterality: N/A;  . ESOPHAGOGASTRODUODENOSCOPY (EGD) WITH PROPOFOL N/A 11/13/2017   Procedure: ESOPHAGOGASTRODUODENOSCOPY (EGD) WITH PROPOFOL;  Surgeon: Wilford Corner, MD;  Location: [facility];  Service: Endoscopy;  Laterality: N/A;  . EYE SURGERY    . IR FLUORO GUIDED NEEDLE PLC ASPIRATION/INJECTION LOC  12/05/2019  . RIGHT/LEFT HEART CATH AND CORONARY ANGIOGRAPHY N/A 09/24/2018   Procedure: RIGHT/LEFT HEART CATH AND CORONARY ANGIOGRAPHY;  Surgeon: Jettie Booze, MD;  Location: [place];  Service: Cardiovascular;  Laterality: N/A;  . TEE WITHOUT CARDIOVERSION N/A 09/27/2018   Procedure: TRANSESOPHAGEAL ECHOCARDIOGRAM (TEE);  Surgeon: Grace Isaac, MD;  Location: [facility];  Service: Open Heart Surgery;  Laterality: N/A;  . TUBAL LIGATION       Current Meds  Medication Sig  . acetaminophen (TYLENOL) 325 MG tablet Take 650 mg by mouth every 6 (six) hours as needed for moderate pain or headache.  [name] aspirin EC 81 MG EC tablet Take 1 tablet (81 mg total) by mouth daily.  [name] buPROPion  (WELLBUTRIN XL) 300 MG 24 hr tablet Take 300 mg by mouth daily.   . carvedilol (COREG) 12.5 MG tablet Take 12.5 mg by mouth 2 (two) times daily with a meal.  . cefTRIAXone (ROCEPHIN) IVPB Inject 2 g into the vein daily. Indication:  Discitis/osteomyelitis  Last Day of Therapy:  01/29/20 Labs - Once weekly:  CBC/D and BMP, Labs - Every other week:  ESR and CRP  . cetirizine (ZYRTEC) 10 MG tablet Take 10 mg by mouth as needed for allergies.  [name] empagliflozin (JARDIANCE) 10 MG TABS tablet Take 10 mg by mouth daily before breakfast.  . furosemide (LASIX) 20 MG tablet Take 1 tablet (20 mg total) by mouth daily.  [name] liraglutide (VICTOZA) 18 MG/3ML SOPN Inject 1.8 mg into the skin daily.   [name] loperamide (IMODIUM) 2 MG capsule Take 1 capsule (2 mg total) by mouth as needed for diarrhea or loose stools.  [name] MAGNESIUM-OXIDE 400 (241.3 Mg) MG tablet TAKE 1 TABLET BY MOUTH TWICE DAILY  . metFORMIN (GLUCOPHAGE) 1000 MG tablet Take 1,000 mg by mouth 2 (two) times a day.  . methocarbamol (ROBAXIN) 500 MG tablet Take 2 tablets (1,000 mg total) by mouth 3 (three) times daily.  [name] oxyCODONE (OXY IR/ROXICODONE) 5 MG immediate release tablet Take 1 tablet (  5 mg total) by mouth every 8 (eight) hours as needed for severe pain or breakthrough pain.  . oxyCODONE (OXYCONTIN) 10 mg 12 hr tablet Take 1 tablet (10 mg total) by mouth every 12 (twelve) hours.  . polyethylene glycol (MIRALAX / GLYCOLAX) 17 g packet Take 17 g by mouth daily as needed for moderate constipation.  . Potassium (GNP POTASSIUM) 99 MG TABS Take 99 mg by mouth 3 (three) times daily.   . rosuvastatin (CRESTOR) 20 MG tablet Take 20 mg by mouth daily.  . sildenafil (REVATIO) 20 MG tablet Take 1 tablet (20 mg total) by mouth 3 (three) times daily.     Allergies:   Minocin [minocycline hcl], Benazepril, Hydrochlorothiazide, and Tape   Social History   Tobacco Use  . Smoking status: Never Smoker  . Smokeless tobacco: Never Used  Substance Use Topics  .  Alcohol use: Never  . Drug use: Never     Family Hx: The patient's family history includes Heart disease in her father; Hypertension in her brother and father; Melanoma in her mother.  ROS:   Please see the history of present illness.    She still does not feel well.  Some nausea.  Not eating well.  No shortness of breath.  Still a lot of pain.  Having to use opiate therapy for back discomfort. All other systems reviewed and are negative.   Prior CV studies:   The following studies were reviewed today:  2D Doppler echocardiogram December 04, 2019: IMPRESSIONS    1. Left ventricular ejection fraction, by estimation, is 60 to 65%. The  left ventricle has normal function. The left ventricle has no regional  wall motion abnormalities. Left ventricular diastolic parameters are  consistent with Grade II diastolic  dysfunction (pseudonormalization). Elevated left ventricular end-diastolic  pressure.  2. Right ventricular systolic function is mildly reduced. The right  ventricular size is normal. There is moderately elevated pulmonary artery  systolic pressure.  3. The mitral valve is normal in structure and function. Mild mitral  valve regurgitation. No evidence of mitral stenosis.  4. The aortic valve is tricuspid. Aortic valve regurgitation is not  visualized. Mild aortic valve stenosis. There is a 21 mm valve present in  the aortic position. Procedure Date: 09/27/2018.  5. The inferior vena cava is normal in size with greater than 50%  respiratory variability, suggesting right atrial pressure of 3 mmHg.   Labs/Other Tests and Data Reviewed:    EKG:  Normal sinus rhythm, sinus tachycardia 104 bpm, left axis deviation.  This tracing was performed on 12/02/2019.  Recent Labs: 12/02/2019: ALT 16 12/09/2019: BUN 15; Creatinine, Ser 0.80; Hemoglobin 9.4; Platelets 167; Potassium 3.5; Sodium 139   Recent Lipid Panel Lab Results  Component Value Date/Time   CHOL 145 05/08/2018  08:20 AM   TRIG 152 (H) 05/08/2018 08:20 AM   HDL 43 05/08/2018 08:20 AM   CHOLHDL 3.4 05/08/2018 08:20 AM   LDLCALC 72 05/08/2018 08:20 AM    Wt Readings from Last 3 Encounters:  12/17/19 170 lb (77.1 kg)  12/02/19 170 lb (77.1 kg)  11/29/19 170 lb (77.1 kg)     Objective:    Vital Signs:  BP 108/61   Pulse (!) 105   Ht 5' 3" (1.6 m)   Wt 170 lb (77.1 kg)   LMP  (LMP Unknown)   BMI 30.11 kg/m    VITAL SIGNS:  reviewed  ASSESSMENT & PLAN:    1. Persistent atrial fibrillation (HCC)     2. Essential hypertension   3. OSA (obstructive sleep apnea)   4. Long term current use of amiodarone   5. Chronic diastolic heart failure (HCC)   6. Coronary artery disease of bypass graft of native heart with stable angina pectoris (HCC)   7. Anticoagulation goal of INR 2 to 3   8. Educated about COVID-19 virus infection    PLAN: 1. There were no instances of atrial fibrillation during the hospital stay and management of sepsis. 2. Blood pressure as noted is well controlled and may be slightly low.  This needs to be monitored closely. 3. Continue to use CPAP 4. Amiodarone therapy has been discontinued. 5.  No clinical evidence of volume overload or symptoms to suggest heart failure. 6. Continue aspirin therapy.  Not currently using anticoagulation. 7. COVID-19 vaccine status unknown.  She is socially distancing and mask wearing.  We will see her in May.  COVID-19 Education: The signs and symptoms of COVID-19 were discussed with the patient and how to seek care for testing (follow up with PCP or arrange E-visit).  The importance of social distancing was discussed today.  Time:   Today, I have spent 17 minutes with the patient with telehealth technology discussing the above problems.     Medication Adjustments/Labs and Tests Ordered: Current medicines are reviewed at length with the patient today.  Concerns regarding medicines are outlined above.   Tests Ordered: No orders of the  defined types were placed in this encounter.   Medication Changes: No orders of the defined types were placed in this encounter.   Follow Up:  In Person 2 months  Signed, Henry W Smith III, MD  12/17/2019 2:10 PM    [facility] 

## 2019-12-22 ENCOUNTER — Encounter: Payer: Self-pay | Admitting: Internal Medicine

## 2019-12-24 ENCOUNTER — Ambulatory Visit: Payer: Medicare Other | Attending: Internal Medicine

## 2019-12-24 DIAGNOSIS — Z23 Encounter for immunization: Secondary | ICD-10-CM

## 2019-12-24 NOTE — Progress Notes (Signed)
   Covid-19 Vaccination Clinic  Name:  Doris Jefferson    MRN: [id number] DOB: 07/24/1941  12/24/2019  [name] was observed post Covid-19 immunization for 15 minutes without incident. She was provided with Vaccine Information Sheet and instruction to access the V-Safe system.   [name] was instructed to call 911 with any severe reactions post vaccine: [name] Difficulty breathing  . Swelling of face and throat  . A fast heartbeat  . A bad rash all over body  . Dizziness and weakness   Immunizations Administered    Name Date Dose VIS Date Route   Pfizer COVID-19 Vaccine 12/24/2019  9:58 AM 0.3 mL 09/19/2019 Intramuscular   Manufacturer: [place]   Lot: [id number]   [place]: [id number]

## 2019-12-29 ENCOUNTER — Encounter: Payer: Self-pay | Admitting: Internal Medicine

## 2019-12-29 ENCOUNTER — Other Ambulatory Visit: Payer: Self-pay

## 2019-12-29 ENCOUNTER — Ambulatory Visit (INDEPENDENT_AMBULATORY_CARE_PROVIDER_SITE_OTHER): Payer: Medicare Other | Admitting: Internal Medicine

## 2019-12-29 DIAGNOSIS — M5440 Lumbago with sciatica, unspecified side: Secondary | ICD-10-CM

## 2019-12-29 DIAGNOSIS — Z5181 Encounter for therapeutic drug level monitoring: Secondary | ICD-10-CM | POA: Diagnosis not present

## 2019-12-29 DIAGNOSIS — I25708 Atherosclerosis of coronary artery bypass graft(s), unspecified, with other forms of angina pectoris: Secondary | ICD-10-CM | POA: Diagnosis not present

## 2019-12-29 DIAGNOSIS — Z452 Encounter for adjustment and management of vascular access device: Secondary | ICD-10-CM | POA: Insufficient documentation

## 2019-12-29 DIAGNOSIS — M4645 Discitis, unspecified, thoracolumbar region: Secondary | ICD-10-CM

## 2019-12-29 NOTE — Assessment & Plan Note (Signed)
Improved and working with her PCP on pain control

## 2019-12-29 NOTE — Assessment & Plan Note (Signed)
Working well and no issues.  Will keep in until I see her next visit

## 2019-12-29 NOTE — Assessment & Plan Note (Signed)
Seems to be improving clinically pretty well.  Very pleased with the progress.  Will plan to continue with the 8 weeks of IV penicillin and have her return before completion to determine if it is ok to pull the picc line and stop.  Will consider oral continuation if there are any issues.

## 2019-12-29 NOTE — Progress Notes (Signed)
Subjective:    Patient ID: Doris Jefferson, female    DOB: November 16, 1940, 79 y.o.   MRN: [id number]  HPI Here for hsfu. 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  Also with a ventral epidural phlegmon.  Started on antibiotics and now on penicillin infusion through April 22nd.  She is improved, walking with the help of a walker and able to now even get herself out of bed.  Still with some tinges of pain and working with her PCP for pain control.  Overall getting better.  CRP down to 88 and ESR down to 34.  No associated rash, no significant diarrhea.   Getting PT and OT at home   Review of Systems  Constitutional: Negative for chills and fever.  Gastrointestinal: Negative for diarrhea and nausea.  Skin: Negative for rash.       Objective:   Physical Exam Constitutional:      Appearance: Normal appearance.  Eyes:     General: No scleral icterus. Pulmonary:     Effort: Pulmonary effort is normal.  Skin:    Comments: picc with no surrounding erythema  Neurological:     General: No focal deficit present.     Mental Status: She is alert.  Psychiatric:        Mood and Affect: Mood normal.   SH: no tobacco        Assessment & Plan:

## 2019-12-29 NOTE — Assessment & Plan Note (Addendum)
Creat, LFTs stable Will continue to monitor

## 2020-01-05 ENCOUNTER — Encounter: Payer: Self-pay | Admitting: Internal Medicine

## 2020-01-19 ENCOUNTER — Encounter: Payer: Self-pay | Admitting: Internal Medicine

## 2020-01-22 ENCOUNTER — Telehealth: Payer: Self-pay

## 2020-01-22 NOTE — Telephone Encounter (Signed)
Patient came into office today to drop off forms to be filled out by MD. Forms are from [employer] for reimbursement regarding trip that was planned in early May.  Patient states she cannot sit in a car for six hours due to her diagnosis. Will leave forms in MD's box to be filled out; will leave a copy in triage.  Patient would like forms mailed once completed. [place]

## 2020-02-04 ENCOUNTER — Ambulatory Visit (INDEPENDENT_AMBULATORY_CARE_PROVIDER_SITE_OTHER): Payer: Medicare Other | Admitting: Internal Medicine

## 2020-02-04 ENCOUNTER — Encounter: Payer: Self-pay | Admitting: Internal Medicine

## 2020-02-04 ENCOUNTER — Telehealth: Payer: Self-pay | Admitting: *Deleted

## 2020-02-04 ENCOUNTER — Other Ambulatory Visit: Payer: Self-pay

## 2020-02-04 VITALS — BP 162/80 | HR 113 | Temp 98.1°F | Ht 63.0 in | Wt 161.0 lb

## 2020-02-04 DIAGNOSIS — I25708 Atherosclerosis of coronary artery bypass graft(s), unspecified, with other forms of angina pectoris: Secondary | ICD-10-CM

## 2020-02-04 DIAGNOSIS — Z452 Encounter for adjustment and management of vascular access device: Secondary | ICD-10-CM | POA: Diagnosis not present

## 2020-02-04 DIAGNOSIS — M4645 Discitis, unspecified, thoracolumbar region: Secondary | ICD-10-CM | POA: Diagnosis present

## 2020-02-04 DIAGNOSIS — Z5181 Encounter for therapeutic drug level monitoring: Secondary | ICD-10-CM | POA: Diagnosis not present

## 2020-02-04 MED ORDER — AMOXICILLIN 500 MG PO CAPS: 500.0000 mg | ORAL_CAPSULE | Freq: Three times a day (TID) | ORAL | 2 refills | Status: DC

## 2020-02-04 NOTE — Telephone Encounter (Signed)
[employer] ---document is complete sign Dr. Linus Salmons. Pt received the document today appointment

## 2020-02-04 NOTE — Assessment & Plan Note (Signed)
She is improving overall.  Likely will need time to continue to recover but more active.   Will check esr and crp and if ok, do just one month of amoxicillin.  If elevated at all, will consider 3 months and recheck

## 2020-02-04 NOTE — Assessment & Plan Note (Signed)
Has been working well.  Will remove today

## 2020-02-04 NOTE — Progress Notes (Signed)
   Subjective:    Patient ID: Doris Jefferson, female    DOB: 1941-04-04, 79 y.o.   MRN: [id number]  HPI Here for hsfu. 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  Also with a ventral epidural phlegmon.  Started on antibiotics and now completed penicillin infusion through April 22nd.  Some muscle spasm but overall increasing activity.  No fever or chills.     Review of Systems  Constitutional: Negative for chills and fever.  Gastrointestinal: Negative for diarrhea and nausea.  Skin: Negative for rash.       Objective:   Physical Exam Constitutional:      Appearance: Normal appearance.  Eyes:     General: No scleral icterus. Pulmonary:     Effort: Pulmonary effort is normal.  Skin:    Comments: picc with no surrounding erythema  Neurological:     General: No focal deficit present.     Mental Status: She is alert.  Psychiatric:        Mood and Affect: Mood normal.   SH: no tobacco        Assessment & Plan:

## 2020-02-04 NOTE — Assessment & Plan Note (Signed)
Will check bmp and cbc today.

## 2020-02-05 ENCOUNTER — Telehealth: Payer: Self-pay

## 2020-02-05 LAB — C-REACTIVE PROTEIN: CRP: 5.6 mg/L (ref <8.0)

## 2020-02-05 LAB — BASIC METABOLIC PANEL
BUN/Creatinine Ratio: 21 (calc) (ref 6–22)
BUN: 28 mg/dL — ABNORMAL HIGH (ref 7–25)
CO2: 25 mmol/L (ref 20–32)
Calcium: 9.1 mg/dL (ref 8.6–10.4)
Chloride: 105 mmol/L (ref 98–110)
Creat: 1.34 mg/dL — ABNORMAL HIGH (ref 0.60–0.93)
Glucose, Bld: 172 mg/dL — ABNORMAL HIGH (ref 65–99)
Potassium: 4.3 mmol/L (ref 3.5–5.3)
Sodium: 140 mmol/L (ref 135–146)

## 2020-02-05 LAB — SEDIMENTATION RATE: Sed Rate: 9 mm/h (ref 0–30)

## 2020-02-05 NOTE — Telephone Encounter (Signed)
Called patient to relay his message. Patient does not have any questions at this time. Will call office with any concerns she might have. Doris Jefferson

## 2020-02-05 NOTE — Telephone Encounter (Signed)
-----  Message from Thayer Headings, MD sent at 02/05/2020 11:36 AM EDT ----- Please let her know that her labs look great - normal ESR and CRP - so she should take the amoxicillin for 1 month (just prescribed) and then stop.  No follow up needed unless something changes.  thanks

## 2020-03-02 NOTE — Progress Notes (Signed)
Cardiology Office Note:    Date:  03/03/2020   ID:  [name], DOB 1941/05/21, MRN [id number]  PCP:  Aletha Halim., PA-C  Cardiologist:  Sinclair Grooms, MD   Referring MD: Aletha Halim., PA-C   Chief Complaint  Patient presents with  . Hypertension  . Atrial Fibrillation    History of Present Illness:    Doris Jefferson is a 79 y.o. female with a hx of Paroxysmal atrial fibrillation, OSA, hypertension, hyperlipidemia, aortic stenosis, type II DM, and recentAVR(bioprosthesis), LAA clip, and 3 vessel CABG (with LIMA to LAD, SVG to OM, and SVG to RCA) 09/27/18. Required cardioversion and IV amio loading prior to DC (12/30).  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  She feels better today.  She is making improvement since having bacteremia.  No chest discomfort or other cardiac issues.  She is making progress since the thoracic and lumbar discitis in February.  Her breathing is doing well.  She denies chest pain and syncope.  She feels no heart pounding or racing.  Past Medical History:  Diagnosis Date  . Coronary artery disease   . Depression   . GERD (gastroesophageal reflux disease)   . Heart murmur   . HTN (hypertension)   . Hyperlipidemia   . Iron deficiency anemia   . Obesity (BMI 30-39.9) 03/04/2014  . OSA on CPAP    severe with AHI 31/hr  . PAF (paroxysmal atrial fibrillation) ([place])   . Pneumonia    "twice; years ago" (09/24/2018)  . Skin cancer    "left neck; burned off"  . Type II diabetes mellitus ([place])     Past Surgical History:  Procedure Laterality Date  . ABDOMINAL HYSTERECTOMY    . ANTERIOR CERVICAL DECOMP/DISCECTOMY FUSION     "cage around my neck; Dr. Erline Levine"  . AORTIC VALVE REPLACEMENT N/A 09/27/2018   Procedure: AORTIC VALVE REPLACEMENT (AVR) USING MAGNA EASE SIZE 21 MM;  Surgeon: Grace Isaac, MD;  Location: [place];  Service: Open Heart Surgery;  Laterality: N/A;  . BACK  SURGERY    . CARDIAC CATHETERIZATION  09/24/2018  . CARDIOVERSION N/A 12/19/2013   Procedure: CARDIOVERSION;  Surgeon: Sinclair Grooms, MD;  Location: [facility] ENDOSCOPY;  Service: Cardiovascular;  Laterality: N/A;  . CARDIOVERSION N/A 08/16/2015   Procedure: CARDIOVERSION;  Surgeon: Larey Dresser, MD;  Location: [place];  Service: Cardiovascular;  Laterality: N/A;  . CARDIOVERSION N/A 09/13/2015   Procedure: CARDIOVERSION;  Surgeon: Sueanne Margarita, MD;  Location: [facility] ENDOSCOPY;  Service: Cardiovascular;  Laterality: N/A;  . CARDIOVERSION N/A 10/07/2018   Procedure: CARDIOVERSION;  Surgeon: Sanda Klein, MD;  Location: [facility] ENDOSCOPY;  Service: Cardiovascular;  Laterality: N/A;  . CATARACT EXTRACTION W/ INTRAOCULAR LENS  IMPLANT, BILATERAL Bilateral   . CLIPPING OF ATRIAL APPENDAGE N/A 09/27/2018   Procedure: CLIPPING OF ATRIAL APPENDAGE USING ATRICURE FLEXV SIZE 35MM;  Surgeon: Grace Isaac, MD;  Location: [place];  Service: Open Heart Surgery;  Laterality: N/A;  . COLONOSCOPY WITH PROPOFOL N/A 11/13/2017   Procedure: COLONOSCOPY WITH PROPOFOL;  Surgeon: Wilford Corner, MD;  Location: [facility] ENDOSCOPY;  Service: Endoscopy;  Laterality: N/A;  ANTIBIOTIC NEEDED BEFORE PROCEDURE  . CORONARY ARTERY BYPASS GRAFT N/A 09/27/2018   Procedure: CORONARY ARTERY BYPASS GRAFTING (CABG) x three, using left internal mammary artery and right leg greater saphenous vein harvested endoscopically;  Surgeon: Grace Isaac, MD;  Location: [place];  Service: Open Heart Surgery;  Laterality: N/A;  . ESOPHAGOGASTRODUODENOSCOPY (EGD) WITH PROPOFOL N/A 11/13/2017   Procedure: ESOPHAGOGASTRODUODENOSCOPY (EGD) WITH PROPOFOL;  Surgeon: Wilford Corner, MD;  Location: [facility] ENDOSCOPY;  Service: Endoscopy;  Laterality: N/A;  . EYE SURGERY    . IR FLUORO GUIDED NEEDLE PLC ASPIRATION/INJECTION LOC  12/05/2019  . RIGHT/LEFT HEART CATH AND CORONARY ANGIOGRAPHY N/A 09/24/2018   Procedure: RIGHT/LEFT HEART CATH AND CORONARY  ANGIOGRAPHY;  Surgeon: Jettie Booze, MD;  Location: [place] CV LAB;  Service: Cardiovascular;  Laterality: N/A;  . TEE WITHOUT CARDIOVERSION N/A 09/27/2018   Procedure: TRANSESOPHAGEAL ECHOCARDIOGRAM (TEE);  Surgeon: Grace Isaac, MD;  Location: [place];  Service: Open Heart Surgery;  Laterality: N/A;  . TUBAL LIGATION      Current Medications: Current Meds  Medication Sig  . acetaminophen (TYLENOL) 325 MG tablet Take 650 mg by mouth every 6 (six) hours as needed for moderate pain or headache.  [name] amoxicillin (AMOXIL) 500 MG capsule Take 1 capsule (500 mg total) by mouth 3 (three) times daily.  [name] aspirin EC 81 MG EC tablet Take 1 tablet (81 mg total) by mouth daily.  [name] buPROPion (WELLBUTRIN XL) 300 MG 24 hr tablet Take 300 mg by mouth daily.   . carvedilol (COREG) 12.5 MG tablet Take 12.5 mg by mouth 2 (two) times daily with a meal.  . cetirizine (ZYRTEC) 10 MG tablet Take 10 mg by mouth as needed for allergies.  [name] empagliflozin (JARDIANCE) 10 MG TABS tablet Take 10 mg by mouth daily before breakfast.  . furosemide (LASIX) 20 MG tablet Take 1 tablet (20 mg total) by mouth daily.  [name] liraglutide (VICTOZA) 18 MG/3ML SOPN Inject 1.8 mg into the skin daily.   [name] loperamide (IMODIUM) 2 MG capsule Take 1 capsule (2 mg total) by mouth as needed for diarrhea or loose stools.  [name] MAGNESIUM-OXIDE 400 (241.3 Mg) MG tablet TAKE 1 TABLET BY MOUTH TWICE DAILY  . metFORMIN (GLUCOPHAGE) 1000 MG tablet Take 1,000 mg by mouth 2 (two) times a day.  . methocarbamol (ROBAXIN) 500 MG tablet Take 2 tablets (1,000 mg total) by mouth 3 (three) times daily.  [name] oxyCODONE (OXY IR/ROXICODONE) 5 MG immediate release tablet Take 1 tablet (5 mg total) by mouth every 8 (eight) hours as needed for severe pain or breakthrough pain.  [name] oxyCODONE (OXYCONTIN) 10 mg 12 hr tablet Take 1 tablet (10 mg total) by mouth every 12 (twelve) hours.  . polyethylene glycol (MIRALAX / GLYCOLAX) 17 g packet Take 17 g by mouth daily  as needed for moderate constipation.  . Potassium (GNP POTASSIUM) 99 MG TABS Take 99 mg by mouth 3 (three) times daily.   . rosuvastatin (CRESTOR) 20 MG tablet Take 20 mg by mouth daily.  . sildenafil (REVATIO) 20 MG tablet Take 1 tablet (20 mg total) by mouth 3 (three) times daily.     Allergies:   Minocin [minocycline hcl], Benazepril, Hydrochlorothiazide, and Tape   Social History   Socioeconomic History  . Marital status: Married    Spouse name: Thomsa  . Number of children: Not on file  . Years of education: 67  . Highest education level: High school graduate  Occupational History  . Occupation: Retired  Tobacco Use  . Smoking status: Never Smoker  . Smokeless tobacco: Never Used  Substance and Sexual Activity  . Alcohol use: Never  . Drug use: Never  . Sexual activity: Not Currently  Other Topics Concern  . Not on file  Social History Narrative  .  Not on file   Social Determinants of Health   Financial Resource Strain:   . Difficulty of Paying Living Expenses:   Food Insecurity:   . Worried About Charity fundraiser in the Last Year:   . Arboriculturist in the Last Year:   Transportation Needs:   . Film/video editor (Medical):   [name] Lack of Transportation (Non-Medical):   Physical Activity:   . Days of Exercise per Week:   . Minutes of Exercise per Session:   Stress:   . Feeling of Stress :   Social Connections:   . Frequency of Communication with Friends and Family:   . Frequency of Social Gatherings with Friends and Family:   . Attends Religious Services:   . Active Member of Clubs or Organizations:   . Attends Archivist Meetings:   [name] Marital Status:      Family History: The patient's family history includes Heart disease in her father; Hypertension in her brother and father; Melanoma in her mother.  ROS:   Please see the history of present illness.    Ambulating better.  Back discomfort is improving.  All other systems reviewed and are  negative.  EKGs/Labs/Other Studies Reviewed:    The following studies were reviewed today: No new imaging data.  Last echocardiogram  EKG:  EKG February 2021 sinus tachycardia.  Today's EKG demonstrates atrial fibrillation with rapid ventricular response at 102 bpm.  No acute ST-T wave changes noted.  Recent Labs: 12/02/2019: ALT 16 12/09/2019: Hemoglobin 9.4; Platelets 167 02/04/2020: BUN 28; Creat 1.34; Potassium 4.3; Sodium 140  Recent Lipid Panel    Component Value Date/Time   CHOL 145 05/08/2018 0820   TRIG 152 (H) 05/08/2018 0820   HDL 43 05/08/2018 0820   CHOLHDL 3.4 05/08/2018 0820   LDLCALC 72 05/08/2018 0820    Physical Exam:    VS:  BP (!) 152/68   Pulse (!) 105   Ht 5\' 3"  (1.6 m)   Wt 158 lb 9.6 oz (71.9 kg)   LMP  (LMP Unknown)   SpO2 99%   BMI 28.09 kg/m     Wt Readings from Last 3 Encounters:  03/03/20 158 lb 9.6 oz (71.9 kg)  02/04/20 161 lb (73 kg)  12/17/19 170 lb (77.1 kg)     GEN: Appears compensated.  Slightly pale.. No acute distress HEENT: Normal NECK: No JVD. LYMPHATICS: No lymphadenopathy CARDIAC: Rapid and irregular RR without murmur, gallop, or edema. VASCULAR:  Normal Pulses. No bruits. RESPIRATORY:  Clear to auscultation without rales, wheezing or rhonchi  ABDOMEN: Soft, non-tender, non-distended, No pulsatile mass, MUSCULOSKELETAL: No deformity  SKIN: Warm and dry NEUROLOGIC:  Alert and oriented x 3 PSYCHIATRIC:  Normal affect   ASSESSMENT:    1. Persistent atrial fibrillation ([place])   2. Essential hypertension   3. OSA (obstructive sleep apnea)   4. Long term current use of amiodarone   5. Chronic diastolic heart failure ([place])   6. Coronary artery disease of bypass graft of native heart with stable angina pectoris ([place])   7. Anticoagulation goal of INR 2 to 3   8. Educated about COVID-19 virus infection    PLAN:    In order of problems listed above:  1. Atrial fibrillation, recurrent.  Start amiodarone 200 mg twice daily.   Increase carvedilol to 25 mg twice daily.  Start apixaban 5 mg twice daily stop aspirin.  Follow-up in 2 weeks at which time carvedilol should be decreased  back to 12.5 mg twice daily.  Amiodarone should be decreased to 200 mg/day.  In 2 weeks a hemoglobin and creatinine will be performed.  An EKG will be performed.  If she is still in A. fib she should be set up for cardioversion 2 weeks after these medication adjustments.  I need to see her back in the office in July. 2. Blood pressure is high.  Increase carvedilol to 25 mg twice daily. 3. Encourage CPAP 4. May need to resume amiodarone therapy.  She has had left atrial appendage occlusion. 5. No evidence of volume overload 6. She is not having angina.  Secondary prevention discussed. 7. May need Eliquis therapy if an atrial fib 8. She has been vaccinated.   Medication Adjustments/Labs and Tests Ordered: Current medicines are reviewed at length with the patient today.  Concerns regarding medicines are outlined above.  No orders of the defined types were placed in this encounter.  No orders of the defined types were placed in this encounter.   There are no Patient Instructions on file for this visit.   Signed, Sinclair Grooms, MD  03/03/2020 2:25 PM    [facility]

## 2020-03-03 ENCOUNTER — Encounter: Payer: Self-pay | Admitting: Interventional Cardiology

## 2020-03-03 ENCOUNTER — Ambulatory Visit (INDEPENDENT_AMBULATORY_CARE_PROVIDER_SITE_OTHER): Payer: Medicare Other | Admitting: Interventional Cardiology

## 2020-03-03 ENCOUNTER — Other Ambulatory Visit: Payer: Self-pay

## 2020-03-03 VITALS — BP 152/68 | HR 105 | Ht 63.0 in | Wt 158.6 lb

## 2020-03-03 DIAGNOSIS — G4733 Obstructive sleep apnea (adult) (pediatric): Secondary | ICD-10-CM | POA: Diagnosis not present

## 2020-03-03 DIAGNOSIS — I4819 Other persistent atrial fibrillation: Secondary | ICD-10-CM | POA: Diagnosis not present

## 2020-03-03 DIAGNOSIS — Z5181 Encounter for therapeutic drug level monitoring: Secondary | ICD-10-CM

## 2020-03-03 DIAGNOSIS — I10 Essential (primary) hypertension: Secondary | ICD-10-CM | POA: Diagnosis not present

## 2020-03-03 DIAGNOSIS — I25708 Atherosclerosis of coronary artery bypass graft(s), unspecified, with other forms of angina pectoris: Secondary | ICD-10-CM | POA: Diagnosis not present

## 2020-03-03 DIAGNOSIS — Z79899 Other long term (current) drug therapy: Secondary | ICD-10-CM

## 2020-03-03 DIAGNOSIS — I5032 Chronic diastolic (congestive) heart failure: Secondary | ICD-10-CM

## 2020-03-03 DIAGNOSIS — Z7189 Other specified counseling: Secondary | ICD-10-CM

## 2020-03-03 DIAGNOSIS — Z7901 Long term (current) use of anticoagulants: Secondary | ICD-10-CM

## 2020-03-03 MED ORDER — APIXABAN 5 MG PO TABS: 5.0000 mg | ORAL_TABLET | Freq: Two times a day (BID) | ORAL | 3 refills | Status: DC

## 2020-03-03 MED ORDER — AMIODARONE HCL 200 MG PO TABS
200.0000 mg | ORAL_TABLET | Freq: Two times a day (BID) | ORAL | 0 refills | Status: DC
Start: 2020-03-03 — End: 2020-03-16

## 2020-03-03 MED ORDER — CARVEDILOL 25 MG PO TABS
25.0000 mg | ORAL_TABLET | Freq: Two times a day (BID) | ORAL | 3 refills | Status: DC
Start: 2020-03-03 — End: 2021-04-26

## 2020-03-03 NOTE — Patient Instructions (Signed)
Medication Instructions:  1) DISCONTINUE Aspirin 2) START Eliquis 5mg  twice daily 3) START Amiodarone 200mg  twice daily for 2 weeks 4) INCREASE Carvedilol to 25mg  twice daily for 2 weeks  *If you need a refill on your cardiac medications before your next appointment, please call your pharmacy*   Lab Work: BMET and CBC in 2 weeks  If you have labs (blood work) drawn today and your tests are completely normal, you will receive your results only by: [name] MyChart Message (if you have MyChart) OR . A paper copy in the mail If you have any lab test that is abnormal or we need to change your treatment, we will call you to review the results.   Testing/Procedures: None   Follow-Up: At [facility], you and your health needs are our priority.  As part of our continuing mission to provide you with exceptional heart care, we have created designated Provider Care Teams.  These Care Teams include your primary Cardiologist (physician) and Advanced Practice Providers (APPs -  Physician Assistants and Nurse Practitioners) who all work together to provide you with the care you need, when you need it.  We recommend signing up for the patient portal called "MyChart".  Sign up information is provided on this After Visit Summary.  MyChart is used to connect with patients for Virtual Visits (Telemedicine).  Patients are able to view lab/test results, encounter notes, upcoming appointments, etc.  Non-urgent messages can be sent to your provider as well.   To learn more about what you can do with MyChart, go to [email or web address].    Your next appointment:   2 week(s)  The format for your next appointment:   In Person  Provider:   Truitt Merle, NP, Cecilie Kicks, NP or Kathyrn Drown, NP   Your physician recommends that you schedule a follow-up appointment in: July with Dr. Tamala Julian.  Other Instructions

## 2020-03-14 NOTE — Progress Notes (Signed)
Cardiology Office Note   Date:  03/16/2020   ID:  Doris Jefferson, DOB June 26, 1941, MRN [id number]  PCP:  Aletha Halim., PA-C  Cardiologist: Dr. Daneen Schick, MD  Chief Complaint  Patient presents with  . Follow-up   History of Present Illness: Doris Jefferson is a 79 y.o. female who presents for 2-week follow-up, seen for Dr. Tamala Julian.  Doris Jefferson has a history of paroxysmal atrial fibrillation, OSA, hypertension, hyperlipidemia, aortic stenosis, DM2 and AVR with prosthesis, LAA clipping and three-vessel CABG with LIMA to LAD, SVG to OM and SVG to RCA 09/27/2018.  Prior to hospital discharge, she required DCCV with IV amiodarone loading for persistent atrial fibrillation.  She was most recently seen by Dr. Tamala Julian 03/03/2020 and reported she was doing well with no shortness of breath, chest pain or syncope.  EKG showed atrial fibrillation with RVR at a rate of 102 bpm.  Given this, she was started on amiodarone 200 mg twice daily, carvedilol was increased to 25 mg twice daily and she was started on Eliquis 5 mg twice daily.  ASA was discontinued.  Plan was for close follow-up at which time carvedilol should be decreased back to 12.5 mg twice daily.  Amiodarone should be decreased to 200 mg p.o. daily.  We will check hemoglobin and creatinine as well as repeat EKG.  If still in atrial fibrillation plan is for OP DCCV 2 weeks after these medication adjustments.  Plan for Dr. Tamala Julian OV 04/2020.  Unfortunately, patient is here today and EKG shows persistent atrial fibrillation however heart rates are more controlled at 85 bpm.  She continues to be fairly asymptomatic with fatigue being her main concern.  She denies chest pain, palpitations, shortness of breath, dizziness or syncope.  Per above plan we will reduce amiodarone to 200 mg p.o. daily from twice daily dosing.  We will leave her carvedilol at 25 mg p.o. twice daily given that she will be going out of town from 03/23/2020 to 04/07/2020 [place].  Her heart rates are more controlled today therefore we will leave beta-blocker as is.  As above, patient will be going out of town for 2 weeks at the end of this month therefore we will plan for repeat lab work, Covid testing and repeat EKG 04/09/2020 with tentative plan for DCCV on 04/13/2020 with close follow-up with Dr. Tamala Julian the end of that week.   Past Medical History:  Diagnosis Date  . Coronary artery disease   . Depression   . GERD (gastroesophageal reflux disease)   . Heart murmur   . HTN (hypertension)   . Hyperlipidemia   . Iron deficiency anemia   . Obesity (BMI 30-39.9) 03/04/2014  . OSA on CPAP    severe with AHI 31/hr  . PAF (paroxysmal atrial fibrillation) ([place])   . Pneumonia    "twice; years ago" (09/24/2018)  . Skin cancer    "left neck; burned off"  . Type II diabetes mellitus ([place])     Past Surgical History:  Procedure Laterality Date  . ABDOMINAL HYSTERECTOMY    . ANTERIOR CERVICAL DECOMP/DISCECTOMY FUSION     "cage around my neck; Dr. Erline Levine"  . AORTIC VALVE REPLACEMENT N/A 09/27/2018   Procedure: AORTIC VALVE REPLACEMENT (AVR) USING MAGNA EASE SIZE 21 MM;  Surgeon: Grace Isaac, MD;  Location: [place];  Service: Open Heart Surgery;  Laterality: N/A;  . BACK SURGERY    . CARDIAC CATHETERIZATION  09/24/2018  .  CARDIOVERSION N/A 12/19/2013   Procedure: CARDIOVERSION;  Surgeon: Sinclair Grooms, MD;  Location: [facility] ENDOSCOPY;  Service: Cardiovascular;  Laterality: N/A;  . CARDIOVERSION N/A 08/16/2015   Procedure: CARDIOVERSION;  Surgeon: Larey Dresser, MD;  Location: [place];  Service: Cardiovascular;  Laterality: N/A;  . CARDIOVERSION N/A 09/13/2015   Procedure: CARDIOVERSION;  Surgeon: Sueanne Margarita, MD;  Location: [facility] ENDOSCOPY;  Service: Cardiovascular;  Laterality: N/A;  . CARDIOVERSION N/A 10/07/2018   Procedure: CARDIOVERSION;  Surgeon: Sanda Klein, MD;  Location: [facility] ENDOSCOPY;  Service: Cardiovascular;  Laterality: N/A;  .  CATARACT EXTRACTION W/ INTRAOCULAR LENS  IMPLANT, BILATERAL Bilateral   . CLIPPING OF ATRIAL APPENDAGE N/A 09/27/2018   Procedure: CLIPPING OF ATRIAL APPENDAGE USING ATRICURE FLEXV SIZE 35MM;  Surgeon: Grace Isaac, MD;  Location: [place];  Service: Open Heart Surgery;  Laterality: N/A;  . COLONOSCOPY WITH PROPOFOL N/A 11/13/2017   Procedure: COLONOSCOPY WITH PROPOFOL;  Surgeon: Wilford Corner, MD;  Location: [facility] ENDOSCOPY;  Service: Endoscopy;  Laterality: N/A;  ANTIBIOTIC NEEDED BEFORE PROCEDURE  . CORONARY ARTERY BYPASS GRAFT N/A 09/27/2018   Procedure: CORONARY ARTERY BYPASS GRAFTING (CABG) x three, using left internal mammary artery and right leg greater saphenous vein harvested endoscopically;  Surgeon: Grace Isaac, MD;  Location: [place];  Service: Open Heart Surgery;  Laterality: N/A;  . ESOPHAGOGASTRODUODENOSCOPY (EGD) WITH PROPOFOL N/A 11/13/2017   Procedure: ESOPHAGOGASTRODUODENOSCOPY (EGD) WITH PROPOFOL;  Surgeon: Wilford Corner, MD;  Location: [facility] ENDOSCOPY;  Service: Endoscopy;  Laterality: N/A;  . EYE SURGERY    . IR FLUORO GUIDED NEEDLE PLC ASPIRATION/INJECTION LOC  12/05/2019  . RIGHT/LEFT HEART CATH AND CORONARY ANGIOGRAPHY N/A 09/24/2018   Procedure: RIGHT/LEFT HEART CATH AND CORONARY ANGIOGRAPHY;  Surgeon: Jettie Booze, MD;  Location: [facility];  Service: Cardiovascular;  Laterality: N/A;  . TEE WITHOUT CARDIOVERSION N/A 09/27/2018   Procedure: TRANSESOPHAGEAL ECHOCARDIOGRAM (TEE);  Surgeon: Grace Isaac, MD;  Location: [place];  Service: Open Heart Surgery;  Laterality: N/A;  . TUBAL LIGATION       Current Outpatient Medications  Medication Sig Dispense Refill  . acetaminophen (TYLENOL) 325 MG tablet Take 650 mg by mouth every 6 (six) hours as needed for moderate pain or headache.    [name] amiodarone (PACERONE) 200 MG tablet Take 1 tablet (200 mg total) by mouth daily. 90 tablet 3  . amoxicillin (AMOXIL) 500 MG capsule Take 1 capsule (500 mg total)  by mouth 3 (three) times daily. 90 capsule 2  . apixaban (ELIQUIS) 5 MG TABS tablet Take 1 tablet (5 mg total) by mouth 2 (two) times daily. 180 tablet 3  . buPROPion (WELLBUTRIN XL) 300 MG 24 hr tablet Take 300 mg by mouth daily.     . carvedilol (COREG) 25 MG tablet Take 1 tablet (25 mg total) by mouth 2 (two) times daily. 180 tablet 3  . cetirizine (ZYRTEC) 10 MG tablet Take 10 mg by mouth as needed for allergies.    [name] empagliflozin (JARDIANCE) 10 MG TABS tablet Take 10 mg by mouth daily before breakfast. 30 tablet 6  . furosemide (LASIX) 20 MG tablet Take 1 tablet (20 mg total) by mouth daily. 90 tablet 2  . liraglutide (VICTOZA) 18 MG/3ML SOPN Inject 1.8 mg into the skin daily.     [name] loperamide (IMODIUM) 2 MG capsule Take 1 capsule (2 mg total) by mouth as needed for diarrhea or loose stools. 30 capsule 0  . MAGNESIUM-OXIDE 400 (241.3 Mg) MG  tablet TAKE 1 TABLET BY MOUTH TWICE DAILY 180 tablet 3  . metFORMIN (GLUCOPHAGE) 1000 MG tablet Take 1,000 mg by mouth 2 (two) times a day.    . methocarbamol (ROBAXIN) 500 MG tablet Take 2 tablets (1,000 mg total) by mouth 3 (three) times daily. 180 tablet 1  . oxyCODONE (OXY IR/ROXICODONE) 5 MG immediate release tablet Take 1 tablet (5 mg total) by mouth every 8 (eight) hours as needed for severe pain or breakthrough pain. 20 tablet 0  . oxyCODONE (OXYCONTIN) 10 mg 12 hr tablet Take 1 tablet (10 mg total) by mouth every 12 (twelve) hours. 20 tablet 0  . polyethylene glycol (MIRALAX / GLYCOLAX) 17 g packet Take 17 g by mouth daily as needed for moderate constipation. 30 each 0  . Potassium (GNP POTASSIUM) 99 MG TABS Take 99 mg by mouth 3 (three) times daily.     . rosuvastatin (CRESTOR) 20 MG tablet Take 20 mg by mouth daily.    . sildenafil (REVATIO) 20 MG tablet Take 1 tablet (20 mg total) by mouth 3 (three) times daily. 90 tablet 3   No current facility-administered medications for this visit.    Allergies:   Minocin [minocycline hcl], Benazepril,  Hydrochlorothiazide, and Tape    Social History:  The patient  reports that she has never smoked. She has never used smokeless tobacco. She reports that she does not drink alcohol or use drugs.   Family History:  The patient's family history includes Heart disease in her father; Hypertension in her brother and father; Melanoma in her mother.   ROS:  Please see the history of present illness.   Otherwise, review of systems are positive for none.  ll other systems are reviewed and negative.    PHYSICAL EXAM: VS:  BP 130/70   Pulse 85   Ht 5\' 3"  (1.6 m)   Wt 157 lb (71.2 kg)   LMP  (LMP Unknown)   SpO2 97%   BMI 27.81 kg/m  , BMI Body mass index is 27.81 kg/m.   General: Well developed, well nourished, NAD Neck: Negative for carotid bruits. No JVD Lungs:Clear to ausculation bilaterally. No wheezes, rales, or rhonchi. Breathing is unlabored. Cardiovascular: RRR with S1 S2. + murmur Extremities: No edema. Radial pulses 2+ bilaterally Neuro: Alert and oriented. No focal deficits. No facial asymmetry. MAE spontaneously. Psych: Responds to questions appropriately with normal affect.     EKG:  EKG is ordered today. The ekg ordered today demonstrates AF with HR 85bpm   Recent Labs: 12/02/2019: ALT 16 12/09/2019: Hemoglobin 9.4; Platelets 167 02/04/2020: BUN 28; Creat 1.34; Potassium 4.3; Sodium 140    Lipid Panel    Component Value Date/Time   CHOL 145 05/08/2018 0820   TRIG 152 (H) 05/08/2018 0820   HDL 43 05/08/2018 0820   CHOLHDL 3.4 05/08/2018 0820   LDLCALC 72 05/08/2018 0820     Wt Readings from Last 3 Encounters:  03/16/20 157 lb (71.2 kg)  03/03/20 158 lb 9.6 oz (71.9 kg)  02/04/20 161 lb (73 kg)     Other studies Reviewed: Additional studies/ records that were reviewed today include:  Review of the above records demonstrates:   Echocardiogram 12/04/2019:  1. Left ventricular ejection fraction, by estimation, is 60 to 65%. The  left ventricle has normal  function. The left ventricle has no regional  wall motion abnormalities. Left ventricular diastolic parameters are  consistent with Grade II diastolic  dysfunction (pseudonormalization). Elevated left ventricular end-diastolic  pressure.  2. Right ventricular systolic function is mildly reduced. The right  ventricular size is normal. There is moderately elevated pulmonary artery  systolic pressure.  3. The mitral valve is normal in structure and function. Mild mitral  valve regurgitation. No evidence of mitral stenosis.  4. The aortic valve is tricuspid. Aortic valve regurgitation is not  visualized. Mild aortic valve stenosis. There is a 21 mm valve present in  the aortic position. Procedure Date: 09/27/2018.  5. The inferior vena cava is normal in size with greater than 50%  respiratory variability, suggesting right atrial pressure of 3 mmHg.   ASSESSMENT AND PLAN:  1.  Persistent atrial fibrillation on anticoagulation: -EKG today shows persistent atrial fibrillation with better rate control at 85 bpm.  During last OV HR noted to be 101 -She was started on amiodarone 200 mg twice daily, carvedilol was increased to 25 mg p.o. twice daily along with Eliquis 5 mg twice daily -Plan was to reduce amiodarone to 200 mg p.o. daily, reduce carvedilol to 12.5 mg twice daily>> we will leave carvedilol at 25 mg p.o. twice daily given the heart rates are better controlled and she will be out of town for the next 2 weeks.  -Check CBC, BMET -She will come for lab, Covid and repeat EKG on 04/09/2020 with scheduled plan for DCCV on 04/13/2020 and close follow-up with Dr. Tamala Julian thereafter  2.  HTN: Stable, 130/70  -Continue current regimen   3.  Chronic diastolic CHF: -No evidence of fluid volume overload on exam  4.  CAD status post CABG: -Denies anginal symptoms -Continue current regimen   Current medicines are reviewed at length with the patient today.  The patient does not have concerns  regarding medicines.  The following changes have been made: Decrease amiodarone to 200 mg p.o. daily  Labs/ tests ordered today include: BMET, CBC, EKG, COVID, DCCV  Orders Placed This Encounter  Procedures  . Basic metabolic panel  . CBC  . EKG 12-Lead    Disposition:   FU with Dr. Tamala Julian in 1 month  Signed, Kathyrn Drown, NP  03/16/2020 3:04 PM    [address], [place], [place]  [address] Phone: [phone]; Fax: [phone]

## 2020-03-16 ENCOUNTER — Other Ambulatory Visit: Payer: Self-pay

## 2020-03-16 ENCOUNTER — Encounter: Payer: Self-pay | Admitting: Cardiology

## 2020-03-16 ENCOUNTER — Ambulatory Visit (INDEPENDENT_AMBULATORY_CARE_PROVIDER_SITE_OTHER): Payer: Medicare Other | Admitting: Cardiology

## 2020-03-16 ENCOUNTER — Other Ambulatory Visit: Payer: Medicare Other

## 2020-03-16 VITALS — BP 130/70 | HR 85 | Ht 63.0 in | Wt 157.0 lb

## 2020-03-16 DIAGNOSIS — I25709 Atherosclerosis of coronary artery bypass graft(s), unspecified, with unspecified angina pectoris: Secondary | ICD-10-CM

## 2020-03-16 DIAGNOSIS — I4819 Other persistent atrial fibrillation: Secondary | ICD-10-CM | POA: Diagnosis not present

## 2020-03-16 DIAGNOSIS — Z79899 Other long term (current) drug therapy: Secondary | ICD-10-CM

## 2020-03-16 DIAGNOSIS — I10 Essential (primary) hypertension: Secondary | ICD-10-CM

## 2020-03-16 MED ORDER — APIXABAN 5 MG PO TABS: 5.0000 mg | ORAL_TABLET | Freq: Two times a day (BID) | ORAL | 3 refills | Status: DC

## 2020-03-16 MED ORDER — AMIODARONE HCL 200 MG PO TABS
200.0000 mg | ORAL_TABLET | Freq: Every day | ORAL | 3 refills | Status: DC
Start: 2020-03-16 — End: 2020-05-10

## 2020-03-16 NOTE — Patient Instructions (Addendum)
Medication Instructions:   Your physician has recommended you make the following change in your medication:   1) Decrease Amiodarone to 200 mg, 1 tablet by mouth once a day  *If you need a refill on your cardiac medications before your next appointment, please call your pharmacy*  Lab Work:  You will have labs drawn today: BMET/CBC  Your physician recommends that you return for lab work on 04/09/20 at 11:30AM for BMET/CBC  If you have labs (blood work) drawn today and your tests are completely normal, you will receive your results only by: [name] MyChart Message (if you have MyChart) OR . A paper copy in the mail If you have any lab test that is abnormal or we need to change your treatment, we will call you to review the results.  Testing/Procedures:  Your physician has recommended that you have a Cardioversion (DCCV). Electrical Cardioversion uses a jolt of electricity to your heart either through paddles or wired patches attached to your chest. This is a controlled, usually prescheduled, procedure. Defibrillation is done under light anesthesia in the hospital, and you usually go home the day of the procedure. This is done to get your heart back into a normal rhythm. You are not awake for the procedure. Please see the instruction sheet given to you today.  Cardioversion on 04/13/20 with Dr. Harrell Gave.  Please arrive at the [facility] (Main Entrance A) at [facility]: [address] [place], [place] [address] at 9:00AM  DIET: Nothing to eat or drink after midnight except a sip of water with medications (see medication instructions below)  Medication Instructions:  Hold Metformin, Jardiance, Lasix, and Victoza the morning of your procedure  Continue your anticoagulant: Eliquis You will need to continue your anticoagulant after your procedure until you are told by your  Provider that it is safe to stop  You must have a responsible person to drive you home and stay in the waiting  area during your procedure. Failure to do so could result in cancellation.  Bring your insurance cards.  *Special Note: Every effort is made to have your procedure done on time. Occasionally there are emergencies that occur at the hospital that may cause delays. Please be patient if a delay does occur.     Follow-Up:  On 04/16/20 at with Daneen Schick, MD

## 2020-03-17 LAB — BASIC METABOLIC PANEL
BUN/Creatinine Ratio: 22 (ref 12–28)
BUN: 30 mg/dL — ABNORMAL HIGH (ref 8–27)
CO2: 21 mmol/L (ref 20–29)
Calcium: 9.6 mg/dL (ref 8.7–10.3)
Chloride: 103 mmol/L (ref 96–106)
Creatinine, Ser: 1.35 mg/dL — ABNORMAL HIGH (ref 0.57–1.00)
GFR calc Af Amer: 43 mL/min/{1.73_m2} — ABNORMAL LOW (ref >59)
GFR calc non Af Amer: 37 mL/min/{1.73_m2} — ABNORMAL LOW (ref >59)
Glucose: 132 mg/dL — ABNORMAL HIGH (ref 65–99)
Potassium: 4.1 mmol/L (ref 3.5–5.2)
Sodium: 142 mmol/L (ref 134–144)

## 2020-03-17 LAB — CBC
Hematocrit: 33.9 % — ABNORMAL LOW (ref 34.0–46.6)
Hemoglobin: 10.2 g/dL — ABNORMAL LOW (ref 11.1–15.9)
MCH: 25.2 pg — ABNORMAL LOW (ref 26.6–33.0)
MCHC: 30.1 g/dL — ABNORMAL LOW (ref 31.5–35.7)
MCV: 84 fL (ref 79–97)
Platelets: 176 10*3/uL (ref 150–450)
RBC: 4.04 x10E6/uL (ref 3.77–5.28)
RDW: 14.2 % (ref 11.7–15.4)
WBC: 7.6 10*3/uL (ref 3.4–10.8)

## 2020-04-09 ENCOUNTER — Other Ambulatory Visit: Payer: Self-pay

## 2020-04-09 ENCOUNTER — Ambulatory Visit (INDEPENDENT_AMBULATORY_CARE_PROVIDER_SITE_OTHER): Payer: Medicare Other | Admitting: *Deleted

## 2020-04-09 ENCOUNTER — Other Ambulatory Visit: Payer: Medicare Other | Admitting: *Deleted

## 2020-04-09 ENCOUNTER — Other Ambulatory Visit (HOSPITAL_COMMUNITY): Payer: Medicare Other

## 2020-04-09 VITALS — HR 67 | Ht 63.0 in | Wt 158.0 lb

## 2020-04-09 DIAGNOSIS — Z79899 Other long term (current) drug therapy: Secondary | ICD-10-CM | POA: Diagnosis not present

## 2020-04-09 DIAGNOSIS — I4819 Other persistent atrial fibrillation: Secondary | ICD-10-CM | POA: Diagnosis not present

## 2020-04-09 DIAGNOSIS — I10 Essential (primary) hypertension: Secondary | ICD-10-CM

## 2020-04-09 DIAGNOSIS — I25709 Atherosclerosis of coronary artery bypass graft(s), unspecified, with unspecified angina pectoris: Secondary | ICD-10-CM

## 2020-04-09 LAB — BASIC METABOLIC PANEL
BUN/Creatinine Ratio: 20 (ref 12–28)
BUN: 25 mg/dL (ref 8–27)
CO2: 23 mmol/L (ref 20–29)
Calcium: 9.1 mg/dL (ref 8.7–10.3)
Chloride: 100 mmol/L (ref 96–106)
Creatinine, Ser: 1.26 mg/dL — ABNORMAL HIGH (ref 0.57–1.00)
GFR calc Af Amer: 47 mL/min/{1.73_m2} — ABNORMAL LOW (ref >59)
GFR calc non Af Amer: 41 mL/min/{1.73_m2} — ABNORMAL LOW (ref >59)
Glucose: 105 mg/dL — ABNORMAL HIGH (ref 65–99)
Potassium: 4.3 mmol/L (ref 3.5–5.2)
Sodium: 139 mmol/L (ref 134–144)

## 2020-04-09 LAB — CBC
Hematocrit: 34.3 % (ref 34.0–46.6)
Hemoglobin: 10.6 g/dL — ABNORMAL LOW (ref 11.1–15.9)
MCH: 25.1 pg — ABNORMAL LOW (ref 26.6–33.0)
MCHC: 30.9 g/dL — ABNORMAL LOW (ref 31.5–35.7)
MCV: 81 fL (ref 79–97)
Platelets: 146 10*3/uL — ABNORMAL LOW (ref 150–450)
RBC: 4.23 x10E6/uL (ref 3.77–5.28)
RDW: 15.2 % (ref 11.7–15.4)
WBC: 8.1 10*3/uL (ref 3.4–10.8)

## 2020-04-09 NOTE — Patient Instructions (Signed)
You are back in normal sinus rhythm. Your Covid screening & cardioversion will be cancelled. Keep your scheduled follow up with Dr. Tamala Julian on 7/9

## 2020-04-09 NOTE — Progress Notes (Signed)
1.  Reason for visit: EKG pre DCCV (scheduled for 7/6)  2.  Name of MD requesting visit:  Kathyrn Drown, NP  3. H&P:  See epic  4.  ROS related to problem:    5.  Assessment and plan per MD:   EKG performed, NSR, HR 67. Reviewed by DOD, Dr. Caryl Comes. Pt made aware DCCV next week not needed at this time and will cancel that and Covid screening. Pt advised to keep follow up next week w/ Dr. Tamala Julian.

## 2020-04-13 ENCOUNTER — Encounter (HOSPITAL_COMMUNITY): Admission: RE | Payer: Self-pay | Source: Home / Self Care

## 2020-04-13 ENCOUNTER — Ambulatory Visit (HOSPITAL_COMMUNITY): Admission: RE | Admit: 2020-04-13 | Payer: Medicare Other | Source: Home / Self Care | Admitting: Cardiology

## 2020-04-13 SURGERY — CARDIOVERSION
Anesthesia: General

## 2020-04-14 NOTE — Progress Notes (Signed)
Cardiology Office Note:    Date:  04/16/2020   ID:  QUIERRA SILVERIO, DOB 1941-06-13, MRN [id number]  PCP:  Aletha Halim., PA-C  Cardiologist:  Sinclair Grooms, MD   Referring MD: Aletha Halim., PA-C   Chief Complaint  Patient presents with  . Chest Pain  . Atrial Fibrillation    History of Present Illness:    Doris Jefferson is a 79 y.o. female with a hx of  Paroxysmal atrial fibrillation, OSA, hypertension, hyperlipidemia, aortic stenosis, type II DM, and recentAVR(bioprosthesis), LAA clip, and 3 vessel CABG (with LIMA to LAD, SVG to OM, and SVG to RCA) 09/27/18. Required cardioversion and IV amio loading prior to DC (12/30).Developed streptococcal bacteremia with thoracic and lumbar discitis in February 2021. Converted on PO amiodarone load.  She is doing better.  Endurance is improved.  Exertional dyspnea has improved.  She was started on amiodarone load and prior to having planned electrical cardioversion, she spontaneously converted.  She denies angina.  She denies edema.  She denies orthopnea.  Past Medical History:  Diagnosis Date  . Coronary artery disease   . Depression   . GERD (gastroesophageal reflux disease)   . Heart murmur   . HTN (hypertension)   . Hyperlipidemia   . Iron deficiency anemia   . Obesity (BMI 30-39.9) 03/04/2014  . OSA on CPAP    severe with AHI 31/hr  . PAF (paroxysmal atrial fibrillation) ([place])   . Pneumonia    "twice; years ago" (09/24/2018)  . Skin cancer    "left neck; burned off"  . Type II diabetes mellitus ([place])     Past Surgical History:  Procedure Laterality Date  . ABDOMINAL HYSTERECTOMY    . ANTERIOR CERVICAL DECOMP/DISCECTOMY FUSION     "cage around my neck; Dr. Erline Levine"  . AORTIC VALVE REPLACEMENT N/A 09/27/2018   Procedure: AORTIC VALVE REPLACEMENT (AVR) USING MAGNA EASE SIZE 21 MM;  Surgeon: Grace Isaac, MD;  Location: [place];  Service: Open Heart Surgery;  Laterality: N/A;  . BACK SURGERY    .  CARDIAC CATHETERIZATION  09/24/2018  . CARDIOVERSION N/A 12/19/2013   Procedure: CARDIOVERSION;  Surgeon: Sinclair Grooms, MD;  Location: [facility] ENDOSCOPY;  Service: Cardiovascular;  Laterality: N/A;  . CARDIOVERSION N/A 08/16/2015   Procedure: CARDIOVERSION;  Surgeon: Larey Dresser, MD;  Location: [place];  Service: Cardiovascular;  Laterality: N/A;  . CARDIOVERSION N/A 09/13/2015   Procedure: CARDIOVERSION;  Surgeon: Sueanne Margarita, MD;  Location: [facility] ENDOSCOPY;  Service: Cardiovascular;  Laterality: N/A;  . CARDIOVERSION N/A 10/07/2018   Procedure: CARDIOVERSION;  Surgeon: Sanda Klein, MD;  Location: [facility] ENDOSCOPY;  Service: Cardiovascular;  Laterality: N/A;  . CATARACT EXTRACTION W/ INTRAOCULAR LENS  IMPLANT, BILATERAL Bilateral   . CLIPPING OF ATRIAL APPENDAGE N/A 09/27/2018   Procedure: CLIPPING OF ATRIAL APPENDAGE USING ATRICURE FLEXV SIZE 35MM;  Surgeon: Grace Isaac, MD;  Location: [place];  Service: Open Heart Surgery;  Laterality: N/A;  . COLONOSCOPY WITH PROPOFOL N/A 11/13/2017   Procedure: COLONOSCOPY WITH PROPOFOL;  Surgeon: Wilford Corner, MD;  Location: [facility] ENDOSCOPY;  Service: Endoscopy;  Laterality: N/A;  ANTIBIOTIC NEEDED BEFORE PROCEDURE  . CORONARY ARTERY BYPASS GRAFT N/A 09/27/2018   Procedure: CORONARY ARTERY BYPASS GRAFTING (CABG) x three, using left internal mammary artery and right leg greater saphenous vein harvested endoscopically;  Surgeon: Grace Isaac, MD;  Location: [place];  Service: Open Heart Surgery;  Laterality: N/A;  . ESOPHAGOGASTRODUODENOSCOPY (EGD)  WITH PROPOFOL N/A 11/13/2017   Procedure: ESOPHAGOGASTRODUODENOSCOPY (EGD) WITH PROPOFOL;  Surgeon: Wilford Corner, MD;  Location: [facility] ENDOSCOPY;  Service: Endoscopy;  Laterality: N/A;  . EYE SURGERY    . IR FLUORO GUIDED NEEDLE PLC ASPIRATION/INJECTION LOC  12/05/2019  . RIGHT/LEFT HEART CATH AND CORONARY ANGIOGRAPHY N/A 09/24/2018   Procedure: RIGHT/LEFT HEART CATH AND CORONARY ANGIOGRAPHY;   Surgeon: Jettie Booze, MD;  Location: [place] CV LAB;  Service: Cardiovascular;  Laterality: N/A;  . TEE WITHOUT CARDIOVERSION N/A 09/27/2018   Procedure: TRANSESOPHAGEAL ECHOCARDIOGRAM (TEE);  Surgeon: Grace Isaac, MD;  Location: [place];  Service: Open Heart Surgery;  Laterality: N/A;  . TUBAL LIGATION      Current Medications: Current Meds  Medication Sig  . acetaminophen (TYLENOL) 325 MG tablet Take 650 mg by mouth every 6 (six) hours as needed for moderate pain or headache.  [name] amiodarone (PACERONE) 200 MG tablet Take 1 tablet (200 mg total) by mouth daily.  [name] apixaban (ELIQUIS) 5 MG TABS tablet Take 1 tablet (5 mg total) by mouth 2 (two) times daily.  [name] buPROPion (WELLBUTRIN XL) 300 MG 24 hr tablet Take 300 mg by mouth daily.   . carvedilol (COREG) 25 MG tablet Take 1 tablet (25 mg total) by mouth 2 (two) times daily.  . empagliflozin (JARDIANCE) 10 MG TABS tablet Take 10 mg by mouth daily before breakfast.  . furosemide (LASIX) 20 MG tablet Take 1 tablet (20 mg total) by mouth daily.  [name] liraglutide (VICTOZA) 18 MG/3ML SOPN Inject 1.8 mg into the skin daily.   [name] loperamide (IMODIUM) 2 MG capsule Take 1 capsule (2 mg total) by mouth as needed for diarrhea or loose stools.  [name] MAGNESIUM-OXIDE 400 (241.3 Mg) MG tablet TAKE 1 TABLET BY MOUTH TWICE DAILY  . metFORMIN (GLUCOPHAGE) 1000 MG tablet Take 1,000 mg by mouth 2 (two) times a day.  . methocarbamol (ROBAXIN) 500 MG tablet Take 2 tablets (1,000 mg total) by mouth 3 (three) times daily.  . Potassium (GNP POTASSIUM) 99 MG TABS Take 99 mg by mouth 3 (three) times daily.   . rosuvastatin (CRESTOR) 20 MG tablet Take 20 mg by mouth daily.  . sildenafil (REVATIO) 20 MG tablet Take 1 tablet (20 mg total) by mouth 3 (three) times daily.     Allergies:   Minocin [minocycline hcl], Benazepril, Hydrochlorothiazide, and Tape   Social History   Socioeconomic History  . Marital status: Married    Spouse name: Thomsa  . Number  of children: Not on file  . Years of education: 54  . Highest education level: High school graduate  Occupational History  . Occupation: Retired  Tobacco Use  . Smoking status: Never Smoker  . Smokeless tobacco: Never Used  Vaping Use  . Vaping Use: Never used  Substance and Sexual Activity  . Alcohol use: Never  . Drug use: Never  . Sexual activity: Not Currently  Other Topics Concern  . Not on file  Social History Narrative  . Not on file   Social Determinants of Health   Financial Resource Strain:   . Difficulty of Paying Living Expenses:   Food Insecurity:   . Worried About Charity fundraiser in the Last Year:   . Arboriculturist in the Last Year:   Transportation Needs:   . Film/video editor (Medical):   [name] Lack of Transportation (Non-Medical):   Physical Activity:   . Days of Exercise per Week:   . Minutes  of Exercise per Session:   Stress:   . Feeling of Stress :   Social Connections:   . Frequency of Communication with Friends and Family:   . Frequency of Social Gatherings with Friends and Family:   . Attends Religious Services:   . Active Member of Clubs or Organizations:   . Attends Archivist Meetings:   [name] Marital Status:      Family History: The patient's family history includes Heart disease in her father; Hypertension in her brother and father; Melanoma in her mother.  ROS:   Please see the history of present illness.    She has not had blood in her urine or stool.  No medication side effects.  All other systems reviewed and are negative.  EKGs/Labs/Other Studies Reviewed:    The following studies were reviewed today: No new data  EKG:  EKG most recent EKG performed 04/09/2020 demonstrates with nonspecific ST abnormality and interventricular conduction delay.  PR interval was normal.  Recent Labs: 12/02/2019: ALT 16 04/09/2020: BUN 25; Creatinine, Ser 1.26; Hemoglobin 10.6; Platelets 146; Potassium 4.3; Sodium 139  Recent Lipid  Panel    Component Value Date/Time   CHOL 145 05/08/2018 0820   TRIG 152 (H) 05/08/2018 0820   HDL 43 05/08/2018 0820   CHOLHDL 3.4 05/08/2018 0820   LDLCALC 72 05/08/2018 0820    Physical Exam:    VS:  BP (!) 156/60   Pulse 63   Ht 5' 3"  (1.6 m)   Wt 155 lb 3.2 oz (70.4 kg)   LMP  (LMP Unknown)   SpO2 98%   BMI 27.49 kg/m     Wt Readings from Last 3 Encounters:  04/16/20 155 lb 3.2 oz (70.4 kg)  04/09/20 158 lb (71.7 kg)  03/16/20 157 lb (71.2 kg)     GEN: Elderly. No acute distress HEENT: Normal NECK: No JVD. LYMPHATICS: No lymphadenopathy CARDIAC:  RRR without murmur, gallop, or edema. VASCULAR:  Normal Pulses. No bruits. RESPIRATORY:  Clear to auscultation without rales, wheezing or rhonchi  ABDOMEN: Soft, non-tender, non-distended, No pulsatile mass, MUSCULOSKELETAL: No deformity  SKIN: Warm and dry NEUROLOGIC:  Alert and oriented x 3 PSYCHIATRIC:  Normal affect   ASSESSMENT:    1. Persistent atrial fibrillation ([place])   2. Coronary artery disease involving coronary bypass graft of native heart with angina pectoris ([place])   3. OSA (obstructive sleep apnea)   4. Chronic diastolic heart failure ([place])   5. Essential hypertension   6. Anticoagulation goal of INR 2 to 3   7. Diabetes mellitus type 2 in obese ([place])   8. Educated about COVID-19 virus infection    PLAN:    In order of problems listed above:  1. Resolved/converted pharmacologically with amiodarone.  Currently down to 200 mg/day.  After 3 to 4 months I will attempt to wean her down to 100 mg/day and will likely continue this therapy long-term.  Continue Eliquis therapy.  Monitor for bleeding. 2. Secondary prevention discussed including the benefits of physical activity 3. Compliant with CPAP 4. No clinical evidence of volume overload 5. Continue carvedilol 25 mg twice daily, Jardiance, furosemide 20 mg/day 6. Continue Eliquis.  Check hemoglobin in 3 to 4 months. 7. Continue Jardiance, Glucophage,  low carbohydrate diet, and physical activity. 8. She has been vaccinated.  Overall, we will see her in 3 months with TSH, hemoglobin, and c-Met.  Consider decreasing intensity of amiodarone to 100 mg daily at some point in the future to  decrease risk.   Medication Adjustments/Labs and Tests Ordered: Current medicines are reviewed at length with the patient today.  Concerns regarding medicines are outlined above.  No orders of the defined types were placed in this encounter.  No orders of the defined types were placed in this encounter.   There are no Patient Instructions on file for this visit.   Signed, Sinclair Grooms, MD  04/16/2020 12:11 PM    [place]

## 2020-04-16 ENCOUNTER — Ambulatory Visit (INDEPENDENT_AMBULATORY_CARE_PROVIDER_SITE_OTHER): Payer: Medicare Other | Admitting: Interventional Cardiology

## 2020-04-16 ENCOUNTER — Encounter: Payer: Self-pay | Admitting: Interventional Cardiology

## 2020-04-16 ENCOUNTER — Other Ambulatory Visit: Payer: Self-pay

## 2020-04-16 VITALS — BP 156/60 | HR 63 | Ht 63.0 in | Wt 155.2 lb

## 2020-04-16 DIAGNOSIS — Z5181 Encounter for therapeutic drug level monitoring: Secondary | ICD-10-CM

## 2020-04-16 DIAGNOSIS — Z7189 Other specified counseling: Secondary | ICD-10-CM

## 2020-04-16 DIAGNOSIS — G4733 Obstructive sleep apnea (adult) (pediatric): Secondary | ICD-10-CM

## 2020-04-16 DIAGNOSIS — I25709 Atherosclerosis of coronary artery bypass graft(s), unspecified, with unspecified angina pectoris: Secondary | ICD-10-CM

## 2020-04-16 DIAGNOSIS — I5032 Chronic diastolic (congestive) heart failure: Secondary | ICD-10-CM | POA: Diagnosis not present

## 2020-04-16 DIAGNOSIS — I4819 Other persistent atrial fibrillation: Secondary | ICD-10-CM | POA: Diagnosis not present

## 2020-04-16 DIAGNOSIS — Z7901 Long term (current) use of anticoagulants: Secondary | ICD-10-CM

## 2020-04-16 DIAGNOSIS — E669 Obesity, unspecified: Secondary | ICD-10-CM

## 2020-04-16 DIAGNOSIS — E1169 Type 2 diabetes mellitus with other specified complication: Secondary | ICD-10-CM

## 2020-04-16 DIAGNOSIS — I10 Essential (primary) hypertension: Secondary | ICD-10-CM

## 2020-04-16 NOTE — Patient Instructions (Signed)
Medication Instructions:  Your physician recommends that you continue on your current medications as directed. Please refer to the Current Medication list given to you today.  *If you need a refill on your cardiac medications before your next appointment, please call your pharmacy*   Lab Work: TSH, Liver and BMET 1 week prior to follow up  If you have labs (blood work) drawn today and your tests are completely normal, you will receive your results only by: [name] MyChart Message (if you have MyChart) OR . A paper copy in the mail If you have any lab test that is abnormal or we need to change your treatment, we will call you to review the results.   Testing/Procedures: None   Follow-Up: At [facility], you and your health needs are our priority.  As part of our continuing mission to provide you with exceptional heart care, we have created designated Provider Care Teams.  These Care Teams include your primary Cardiologist (physician) and Advanced Practice Providers (APPs -  Physician Assistants and Nurse Practitioners) who all work together to provide you with the care you need, when you need it.  We recommend signing up for the patient portal called "MyChart".  Sign up information is provided on this After Visit Summary.  MyChart is used to connect with patients for Virtual Visits (Telemedicine).  Patients are able to view lab/test results, encounter notes, upcoming appointments, etc.  Non-urgent messages can be sent to your provider as well.   To learn more about what you can do with MyChart, go to [email or web address].    Your next appointment:   3 month(s)  The format for your next appointment:   In Person  Provider:   You may see Sinclair Grooms, MD or one of the following Advanced Practice Providers on your designated Care Team:    Truitt Merle, NP  Cecilie Kicks, NP  Kathyrn Drown, NP    Other Instructions

## 2020-05-05 ENCOUNTER — Inpatient Hospital Stay: Payer: Medicare Other | Attending: Oncology | Admitting: Oncology

## 2020-05-05 ENCOUNTER — Other Ambulatory Visit: Payer: Self-pay

## 2020-05-05 ENCOUNTER — Inpatient Hospital Stay: Payer: Medicare Other

## 2020-05-05 VITALS — BP 134/42 | HR 67 | Temp 97.3°F | Resp 18 | Ht 63.0 in | Wt 155.8 lb

## 2020-05-05 DIAGNOSIS — D509 Iron deficiency anemia, unspecified: Secondary | ICD-10-CM

## 2020-05-05 DIAGNOSIS — N289 Disorder of kidney and ureter, unspecified: Secondary | ICD-10-CM | POA: Insufficient documentation

## 2020-05-05 DIAGNOSIS — Z79899 Other long term (current) drug therapy: Secondary | ICD-10-CM | POA: Insufficient documentation

## 2020-05-05 DIAGNOSIS — I25709 Atherosclerosis of coronary artery bypass graft(s), unspecified, with unspecified angina pectoris: Secondary | ICD-10-CM

## 2020-05-05 DIAGNOSIS — Z7984 Long term (current) use of oral hypoglycemic drugs: Secondary | ICD-10-CM | POA: Insufficient documentation

## 2020-05-05 DIAGNOSIS — Z7901 Long term (current) use of anticoagulants: Secondary | ICD-10-CM | POA: Insufficient documentation

## 2020-05-05 DIAGNOSIS — D649 Anemia, unspecified: Secondary | ICD-10-CM | POA: Diagnosis present

## 2020-05-05 LAB — CBC WITH DIFFERENTIAL (CANCER CENTER ONLY)
Abs Immature Granulocytes: 0.03 10*3/uL (ref 0.00–0.07)
Basophils Absolute: 0 10*3/uL (ref 0.0–0.1)
Basophils Relative: 0 %
Eosinophils Absolute: 0.2 10*3/uL (ref 0.0–0.5)
Eosinophils Relative: 3 %
HCT: 32.5 % — ABNORMAL LOW (ref 36.0–46.0)
Hemoglobin: 9.9 g/dL — ABNORMAL LOW (ref 12.0–15.0)
Immature Granulocytes: 0 %
Lymphocytes Relative: 30 %
Lymphs Abs: 2.1 10*3/uL (ref 0.7–4.0)
MCH: 25.8 pg — ABNORMAL LOW (ref 26.0–34.0)
MCHC: 30.5 g/dL (ref 30.0–36.0)
MCV: 84.9 fL (ref 80.0–100.0)
Monocytes Absolute: 0.6 10*3/uL (ref 0.1–1.0)
Monocytes Relative: 8 %
Neutro Abs: 4.3 10*3/uL (ref 1.7–7.7)
Neutrophils Relative %: 59 %
Platelet Count: 143 10*3/uL — ABNORMAL LOW (ref 150–400)
RBC: 3.83 MIL/uL — ABNORMAL LOW (ref 3.87–5.11)
RDW: 18.6 % — ABNORMAL HIGH (ref 11.5–15.5)
WBC Count: 7.3 10*3/uL (ref 4.0–10.5)
nRBC: 0 % (ref 0.0–0.2)

## 2020-05-05 LAB — IRON AND TIBC
Iron: 51 ug/dL (ref 41–142)
Saturation Ratios: 16 % — ABNORMAL LOW (ref 21–57)
TIBC: 315 ug/dL (ref 236–444)
UIBC: 264 ug/dL (ref 120–384)

## 2020-05-05 LAB — FERRITIN: Ferritin: 19 ng/mL (ref 11–307)

## 2020-05-05 NOTE — Progress Notes (Signed)
Hematology and Oncology Follow Up Visit  Doris Jefferson [id number] 28-May-1941 79 y.o. 05/05/2020 8:54 AM Doris Halim., PA-CKaplan, Doris Friday., PA-C   Principle Diagnosis: 79 year old woman with anemia diagnosed in 2019.  She was found to have iron deficiency related to poor iron absorption as well as renal insufficiency.   Prior Therapy:  Iron infusion completed in August 2019.  Current therapy: Repeat IV iron infusion as needed.  Interim History: [name] is here for return evaluation.  Since the last visit, she reports no major symptoms at this time.  He denies any chest pain shortness of breath.  He denies any cough, wheezing or hemoptysis.  He denies any excessive fatigue or tiredness.  She is ambulating with the help of walking cane without any recent falls.  Formal status and quality of life limited but unchanged at this time.        Medications: Reviewed without changes. Current Outpatient Medications  Medication Sig Dispense Refill  . acetaminophen (TYLENOL) 325 MG tablet Take 650 mg by mouth every 6 (six) hours as needed for moderate pain or headache.    [name] amiodarone (PACERONE) 200 MG tablet Take 1 tablet (200 mg total) by mouth daily. 90 tablet 3  . apixaban (ELIQUIS) 5 MG TABS tablet Take 1 tablet (5 mg total) by mouth 2 (two) times daily. 180 tablet 3  . buPROPion (WELLBUTRIN XL) 300 MG 24 hr tablet Take 300 mg by mouth daily.     . carvedilol (COREG) 25 MG tablet Take 1 tablet (25 mg total) by mouth 2 (two) times daily. 180 tablet 3  . empagliflozin (JARDIANCE) 10 MG TABS tablet Take 10 mg by mouth daily before breakfast. 30 tablet 6  . furosemide (LASIX) 20 MG tablet Take 1 tablet (20 mg total) by mouth daily. 90 tablet 2  . liraglutide (VICTOZA) 18 MG/3ML SOPN Inject 1.8 mg into the skin daily.     [name] loperamide (IMODIUM) 2 MG capsule Take 1 capsule (2 mg total) by mouth as needed for diarrhea or loose stools. 30 capsule 0  . MAGNESIUM-OXIDE 400 (241.3 Mg) MG  tablet TAKE 1 TABLET BY MOUTH TWICE DAILY 180 tablet 3  . metFORMIN (GLUCOPHAGE) 1000 MG tablet Take 1,000 mg by mouth 2 (two) times a day.    . methocarbamol (ROBAXIN) 500 MG tablet Take 2 tablets (1,000 mg total) by mouth 3 (three) times daily. 180 tablet 1  . Potassium (GNP POTASSIUM) 99 MG TABS Take 99 mg by mouth 3 (three) times daily.     . rosuvastatin (CRESTOR) 20 MG tablet Take 20 mg by mouth daily.    . sildenafil (REVATIO) 20 MG tablet Take 1 tablet (20 mg total) by mouth 3 (three) times daily. 90 tablet 3   No current facility-administered medications for this visit.     Allergies:  Allergies  Allergen Reactions  . Minocin [Minocycline Hcl] Swelling and Other (See Comments)    THROAT SWELLING  . Benazepril Cough  . Hydrochlorothiazide Itching  . Tape Rash       Physical Exam:  Blood pressure (!) 134/42, pulse 67, temperature (!) 97.3 F (36.3 C), temperature source Temporal, resp. rate 18, height 5\' 3"  (1.6 m), weight 155 lb 12.8 oz (70.7 kg), SpO2 99 %.    ECOG: 1    General appearance: Alert, awake without any distress. Head: Atraumatic without abnormalities Oropharynx: Without any thrush or ulcers. Eyes: No scleral icterus. Lymph nodes: No lymphadenopathy noted in the cervical, supraclavicular, or axillary  nodes Heart:regular rate and rhythm, without any murmurs or gallops.   Lung: Clear to auscultation without any rhonchi, wheezes or dullness to percussion. Abdomin: Soft, nontender without any shifting dullness or ascites. Musculoskeletal: No clubbing or cyanosis. Neurological: No motor or sensory deficits. Skin: No rashes or lesions.        Lab Results: Lab Results  Component Value Date   WBC 8.1 04/09/2020   HGB 10.6 (L) 04/09/2020   HCT 34.3 04/09/2020   MCV 81 04/09/2020   PLT 146 (L) 04/09/2020     Chemistry      Component Value Date/Time   NA 139 04/09/2020 1100   K 4.3 04/09/2020 1100   CL 100 04/09/2020 1100   CO2 23  04/09/2020 1100   BUN 25 04/09/2020 1100   CREATININE 1.26 (H) 04/09/2020 1100   CREATININE 1.34 (H) 02/04/2020 1456      Component Value Date/Time   CALCIUM 9.1 04/09/2020 1100   ALKPHOS 82 12/02/2019 2053   AST 21 12/02/2019 2053   ALT 16 12/02/2019 2053   BILITOT 1.2 12/02/2019 2053   BILITOT 0.5 05/08/2018 0820       Impression and Plan:  79 year old woman with the following:  1.    Multifactorial anemia with element of iron deficiency as well as a renal insufficiency diagnosed in July 2019.     She has received intravenous iron replacement in the past with hemoglobin that is currently stable close to normal.  Iron studies in January 2021 showed normal iron level and ferritin.  Risks and benefits of repeat intravenous iron infusion were discussed.  Complications that include arthralgias, myalgias and infusion related issues were discussed.  The role of growth factor support such as Procrit or Aranesp will be used for hemoglobin drops further with normal iron studies.  Her hemoglobin is slightly down today and she is not objective receiving intravenous iron which we will arrange for her in the near future.    2.  Follow-up: Lab and provider visit will be repeated in 6 months.  30  minutes were dedicated to this visit.  The time was spent on reviewing laboratory data, discussing treatment options and addressing complications related to therapy.     Zola Button, MD 7/28/20218:54 AM

## 2020-05-07 ENCOUNTER — Telehealth: Payer: Self-pay | Admitting: Oncology

## 2020-05-07 NOTE — Telephone Encounter (Signed)
Scheduled per 07/28 los, patient has been called and voicemail was left. 

## 2020-05-10 ENCOUNTER — Other Ambulatory Visit: Payer: Self-pay

## 2020-05-10 MED ORDER — AMIODARONE HCL 200 MG PO TABS: 200.0000 mg | ORAL_TABLET | Freq: Every day | ORAL | 3 refills | Status: DC

## 2020-05-11 ENCOUNTER — Telehealth: Payer: Self-pay | Admitting: Oncology

## 2020-05-11 NOTE — Telephone Encounter (Signed)
Scheduled appt per 8/2 sch msg - unable to reach pt . Left message for patient with appt date and time

## 2020-05-12 ENCOUNTER — Inpatient Hospital Stay: Payer: Medicare Other

## 2020-05-14 ENCOUNTER — Ambulatory Visit: Payer: Medicare Other

## 2020-05-19 ENCOUNTER — Ambulatory Visit: Payer: Medicare Other

## 2020-05-26 ENCOUNTER — Other Ambulatory Visit: Payer: Self-pay

## 2020-05-26 ENCOUNTER — Inpatient Hospital Stay: Payer: Medicare Other | Attending: Oncology

## 2020-05-26 VITALS — BP 138/45 | HR 63 | Temp 98.3°F | Resp 18

## 2020-05-26 DIAGNOSIS — D509 Iron deficiency anemia, unspecified: Secondary | ICD-10-CM | POA: Diagnosis present

## 2020-05-26 DIAGNOSIS — D508 Other iron deficiency anemias: Secondary | ICD-10-CM

## 2020-05-26 MED ORDER — SODIUM CHLORIDE 0.9 % IV SOLN
510.0000 mg | Freq: Once | INTRAVENOUS | Status: AC
  Administered 2020-05-26: 510 mg via INTRAVENOUS
  Filled 2020-05-26: qty 510

## 2020-05-26 MED ORDER — SODIUM CHLORIDE 0.9 % IV SOLN
Freq: Once | INTRAVENOUS | Status: AC
  Filled 2020-05-26: qty 250

## 2020-05-26 NOTE — Patient Instructions (Signed)

## 2020-06-02 ENCOUNTER — Inpatient Hospital Stay: Payer: Medicare Other

## 2020-06-02 ENCOUNTER — Other Ambulatory Visit: Payer: Self-pay

## 2020-06-02 VITALS — BP 129/44 | HR 60 | Temp 99.4°F | Resp 18

## 2020-06-02 DIAGNOSIS — D509 Iron deficiency anemia, unspecified: Secondary | ICD-10-CM | POA: Diagnosis not present

## 2020-06-02 DIAGNOSIS — D508 Other iron deficiency anemias: Secondary | ICD-10-CM

## 2020-06-02 MED ORDER — SODIUM CHLORIDE 0.9 % IV SOLN
Freq: Once | INTRAVENOUS | Status: AC
  Filled 2020-06-02: qty 250

## 2020-06-02 MED ORDER — SODIUM CHLORIDE 0.9 % IV SOLN
510.0000 mg | Freq: Once | INTRAVENOUS | Status: AC
  Administered 2020-06-02: 510 mg via INTRAVENOUS
  Filled 2020-06-02: qty 510

## 2020-06-02 NOTE — Patient Instructions (Signed)

## 2020-06-10 ENCOUNTER — Other Ambulatory Visit: Payer: Self-pay | Admitting: Interventional Cardiology

## 2020-07-07 ENCOUNTER — Other Ambulatory Visit: Payer: Self-pay

## 2020-07-07 ENCOUNTER — Other Ambulatory Visit: Payer: Medicare Other | Admitting: *Deleted

## 2020-07-07 DIAGNOSIS — I5032 Chronic diastolic (congestive) heart failure: Secondary | ICD-10-CM

## 2020-07-07 DIAGNOSIS — I25709 Atherosclerosis of coronary artery bypass graft(s), unspecified, with unspecified angina pectoris: Secondary | ICD-10-CM

## 2020-07-07 DIAGNOSIS — I4819 Other persistent atrial fibrillation: Secondary | ICD-10-CM

## 2020-07-07 LAB — BASIC METABOLIC PANEL
BUN/Creatinine Ratio: 16 (ref 12–28)
BUN: 23 mg/dL (ref 8–27)
CO2: 22 mmol/L (ref 20–29)
Calcium: 9.3 mg/dL (ref 8.7–10.3)
Chloride: 104 mmol/L (ref 96–106)
Creatinine, Ser: 1.43 mg/dL — ABNORMAL HIGH (ref 0.57–1.00)
GFR calc Af Amer: 40 mL/min/{1.73_m2} — ABNORMAL LOW (ref >59)
GFR calc non Af Amer: 35 mL/min/{1.73_m2} — ABNORMAL LOW (ref >59)
Glucose: 283 mg/dL — ABNORMAL HIGH (ref 65–99)
Potassium: 4.2 mmol/L (ref 3.5–5.2)
Sodium: 142 mmol/L (ref 134–144)

## 2020-07-07 LAB — HEPATIC FUNCTION PANEL
ALT: 30 IU/L (ref 0–32)
AST: 28 IU/L (ref 0–40)
Albumin: 4.1 g/dL (ref 3.7–4.7)
Alkaline Phosphatase: 65 IU/L (ref 44–121)
Bilirubin Total: 0.3 mg/dL (ref 0.0–1.2)
Bilirubin, Direct: 0.11 mg/dL (ref 0.00–0.40)
Total Protein: 6.3 g/dL (ref 6.0–8.5)

## 2020-07-07 LAB — TSH: TSH: 2.34 u[IU]/mL (ref 0.450–4.500)

## 2020-07-10 ENCOUNTER — Other Ambulatory Visit (HOSPITAL_COMMUNITY): Payer: Self-pay | Admitting: Internal Medicine

## 2020-07-10 ENCOUNTER — Other Ambulatory Visit: Payer: Self-pay | Admitting: Interventional Cardiology

## 2020-07-11 NOTE — Progress Notes (Deleted)
Cardiology Office Note:    Date:  07/11/2020   ID:  Doris Jefferson, DOB 11-26-1940, MRN [id number]  PCP:  Aletha Halim., PA-C  Cardiologist:  Sinclair Grooms, MD   Referring MD: Aletha Halim., PA-C   No chief complaint on file.   History of Present Illness:    Doris Jefferson is a 79 y.o. female with a hx of Paroxysmal atrial fibrillation, OSA, hypertension, hyperlipidemia, aortic stenosis, type II DM, and recentAVR(bioprosthesis), LAA clip, and 3 vessel CABG (with LIMA to LAD, SVG to OM, and SVG to RCA) 09/27/18. Required cardioversion and IV amio loading prior to DC (12/30).Developed streptococcal bacteremia with thoracic and lumbar discitis in February 2021. Converted on PO amiodarone load.  ***  Past Medical History:  Diagnosis Date  . Coronary artery disease   . Depression   . GERD (gastroesophageal reflux disease)   . Heart murmur   . HTN (hypertension)   . Hyperlipidemia   . Iron deficiency anemia   . Obesity (BMI 30-39.9) 03/04/2014  . OSA on CPAP    severe with AHI 31/hr  . PAF (paroxysmal atrial fibrillation) ([place])   . Pneumonia    "twice; years ago" (09/24/2018)  . Skin cancer    "left neck; burned off"  . Type II diabetes mellitus ([place])     Past Surgical History:  Procedure Laterality Date  . ABDOMINAL HYSTERECTOMY    . ANTERIOR CERVICAL DECOMP/DISCECTOMY FUSION     "cage around my neck; Dr. Erline Levine"  . AORTIC VALVE REPLACEMENT N/A 09/27/2018   Procedure: AORTIC VALVE REPLACEMENT (AVR) USING MAGNA EASE SIZE 21 MM;  Surgeon: Grace Isaac, MD;  Location: [place];  Service: Open Heart Surgery;  Laterality: N/A;  . BACK SURGERY    . CARDIAC CATHETERIZATION  09/24/2018  . CARDIOVERSION N/A 12/19/2013   Procedure: CARDIOVERSION;  Surgeon: Sinclair Grooms, MD;  Location: [facility] ENDOSCOPY;  Service: Cardiovascular;  Laterality: N/A;  . CARDIOVERSION N/A 08/16/2015   Procedure: CARDIOVERSION;  Surgeon: Larey Dresser, MD;  Location: [place];  Service: Cardiovascular;  Laterality: N/A;  . CARDIOVERSION N/A 09/13/2015   Procedure: CARDIOVERSION;  Surgeon: Sueanne Margarita, MD;  Location: [facility] ENDOSCOPY;  Service: Cardiovascular;  Laterality: N/A;  . CARDIOVERSION N/A 10/07/2018   Procedure: CARDIOVERSION;  Surgeon: Sanda Klein, MD;  Location: [facility] ENDOSCOPY;  Service: Cardiovascular;  Laterality: N/A;  . CATARACT EXTRACTION W/ INTRAOCULAR LENS  IMPLANT, BILATERAL Bilateral   . CLIPPING OF ATRIAL APPENDAGE N/A 09/27/2018   Procedure: CLIPPING OF ATRIAL APPENDAGE USING ATRICURE FLEXV SIZE 35MM;  Surgeon: Grace Isaac, MD;  Location: [place];  Service: Open Heart Surgery;  Laterality: N/A;  . COLONOSCOPY WITH PROPOFOL N/A 11/13/2017   Procedure: COLONOSCOPY WITH PROPOFOL;  Surgeon: Wilford Corner, MD;  Location: [facility] ENDOSCOPY;  Service: Endoscopy;  Laterality: N/A;  ANTIBIOTIC NEEDED BEFORE PROCEDURE  . CORONARY ARTERY BYPASS GRAFT N/A 09/27/2018   Procedure: CORONARY ARTERY BYPASS GRAFTING (CABG) x three, using left internal mammary artery and right leg greater saphenous vein harvested endoscopically;  Surgeon: Grace Isaac, MD;  Location: [place];  Service: Open Heart Surgery;  Laterality: N/A;  . ESOPHAGOGASTRODUODENOSCOPY (EGD) WITH PROPOFOL N/A 11/13/2017   Procedure: ESOPHAGOGASTRODUODENOSCOPY (EGD) WITH PROPOFOL;  Surgeon: Wilford Corner, MD;  [facility] ENDOSCOPY;  Service: Endoscopy;  Laterality: N/A;  . EYE SURGERY    . IR FLUORO GUIDED NEEDLE PLC ASPIRATION/INJECTION LOC  12/05/2019  . RIGHT/LEFT HEART CATH AND CORONARY ANGIOGRAPHY N/A  09/24/2018   Procedure: RIGHT/LEFT HEART CATH AND CORONARY ANGIOGRAPHY;  Surgeon: Jettie Booze, MD;  Location: [place] CV LAB;  Service: Cardiovascular;  Laterality: N/A;  . TEE WITHOUT CARDIOVERSION N/A 09/27/2018   Procedure: TRANSESOPHAGEAL ECHOCARDIOGRAM (TEE);  Surgeon: Grace Isaac, MD;  Location: [place];  Service: Open Heart Surgery;  Laterality: N/A;  .  TUBAL LIGATION      Current Medications: No outpatient medications have been marked as taking for the 07/12/20 encounter (Appointment) with Belva Crome, MD.     Allergies:   Minocin [minocycline hcl], Benazepril, Hydrochlorothiazide, and Tape   Social History   Socioeconomic History  . Marital status: Married    Spouse name: Thomsa  . Number of children: Not on file  . Years of education: 83  . Highest education level: High school graduate  Occupational History  . Occupation: Retired  Tobacco Use  . Smoking status: Never Smoker  . Smokeless tobacco: Never Used  Vaping Use  . Vaping Use: Never used  Substance and Sexual Activity  . Alcohol use: Never  . Drug use: Never  . Sexual activity: Not Currently  Other Topics Concern  . Not on file  Social History Narrative  . Not on file   Social Determinants of Health   Financial Resource Strain:   . Difficulty of Paying Living Expenses: Not on file  Food Insecurity:   . Worried About Charity fundraiser in the Last Year: Not on file  . Ran Out of Food in the Last Year: Not on file  Transportation Needs:   . Lack of Transportation (Medical): Not on file  . Lack of Transportation (Non-Medical): Not on file  Physical Activity:   . Days of Exercise per Week: Not on file  . Minutes of Exercise per Session: Not on file  Stress:   . Feeling of Stress : Not on file  Social Connections:   . Frequency of Communication with Friends and Family: Not on file  . Frequency of Social Gatherings with Friends and Family: Not on file  . Attends Religious Services: Not on file  . Active Member of Clubs or Organizations: Not on file  . Attends Archivist Meetings: Not on file  . Marital Status: Not on file     Family History: The patient's family history includes Heart disease in her father; Hypertension in her brother and father; Melanoma in her mother.  ROS:   Please see the history of present illness.    *** All other  systems reviewed and are negative.  EKGs/Labs/Other Studies Reviewed:    The following studies were reviewed today: ***  EKG:  EKG ***  Recent Labs: 05/05/2020: Hemoglobin 9.9; Platelet Count 143 07/07/2020: ALT 30; BUN 23; Creatinine, Ser 1.43; Potassium 4.2; Sodium 142; TSH 2.340  Recent Lipid Panel    Component Value Date/Time   CHOL 145 05/08/2018 0820   TRIG 152 (H) 05/08/2018 0820   HDL 43 05/08/2018 0820   CHOLHDL 3.4 05/08/2018 0820   LDLCALC 72 05/08/2018 0820    Physical Exam:    VS:  LMP  (LMP Unknown)     Wt Readings from Last 3 Encounters:  05/05/20 155 lb 12.8 oz (70.7 kg)  04/16/20 155 lb 3.2 oz (70.4 kg)  04/09/20 158 lb (71.7 kg)     GEN: ***. No acute distress HEENT: Normal NECK: No JVD. LYMPHATICS: No lymphadenopathy CARDIAC: *** RRR without murmur, gallop, or edema. VASCULAR: *** Normal Pulses. No bruits.  RESPIRATORY:  Clear to auscultation without rales, wheezing or rhonchi  ABDOMEN: Soft, non-tender, non-distended, No pulsatile mass, MUSCULOSKELETAL: No deformity  SKIN: Warm and dry NEUROLOGIC:  Alert and oriented x 3 PSYCHIATRIC:  Normal affect   ASSESSMENT:    1. Persistent atrial fibrillation ([place])   2. Coronary artery disease involving coronary bypass graft of native heart with angina pectoris ([place])   3. OSA (obstructive sleep apnea)   4. Chronic diastolic heart failure ([place])   5. Essential hypertension   6. Anticoagulation goal of INR 2 to 3   7. Diabetes mellitus type 2 in obese ([place])   8. Long term current use of amiodarone   9. Educated about COVID-19 virus infection    PLAN:    In order of problems listed above:  1. Decrease Amiodarone dose. ***   Medication Adjustments/Labs and Tests Ordered: Current medicines are reviewed at length with the patient today.  Concerns regarding medicines are outlined above.  No orders of the defined types were placed in this encounter.  No orders of the defined types were placed in this  encounter.   There are no Patient Instructions on file for this visit.   Signed, Sinclair Grooms, MD  07/11/2020 5:00 PM    [place]

## 2020-07-12 ENCOUNTER — Other Ambulatory Visit: Payer: Self-pay

## 2020-07-12 ENCOUNTER — Ambulatory Visit: Payer: Medicare Other | Admitting: Interventional Cardiology

## 2020-07-12 MED ORDER — MAGNESIUM OXIDE 400 (241.3 MG) MG PO TABS: 1.0000 | ORAL_TABLET | Freq: Two times a day (BID) | ORAL | 2 refills | Status: AC

## 2020-08-23 IMAGING — CR DG CHEST 2V
2 series · 2 of 2 positions shown · non-contrast
Comparison: 04/20/2018

CLINICAL DATA: Preop evaluation for upcoming heart surgery

EXAM:
CHEST - 2 VIEW

[chest pa]
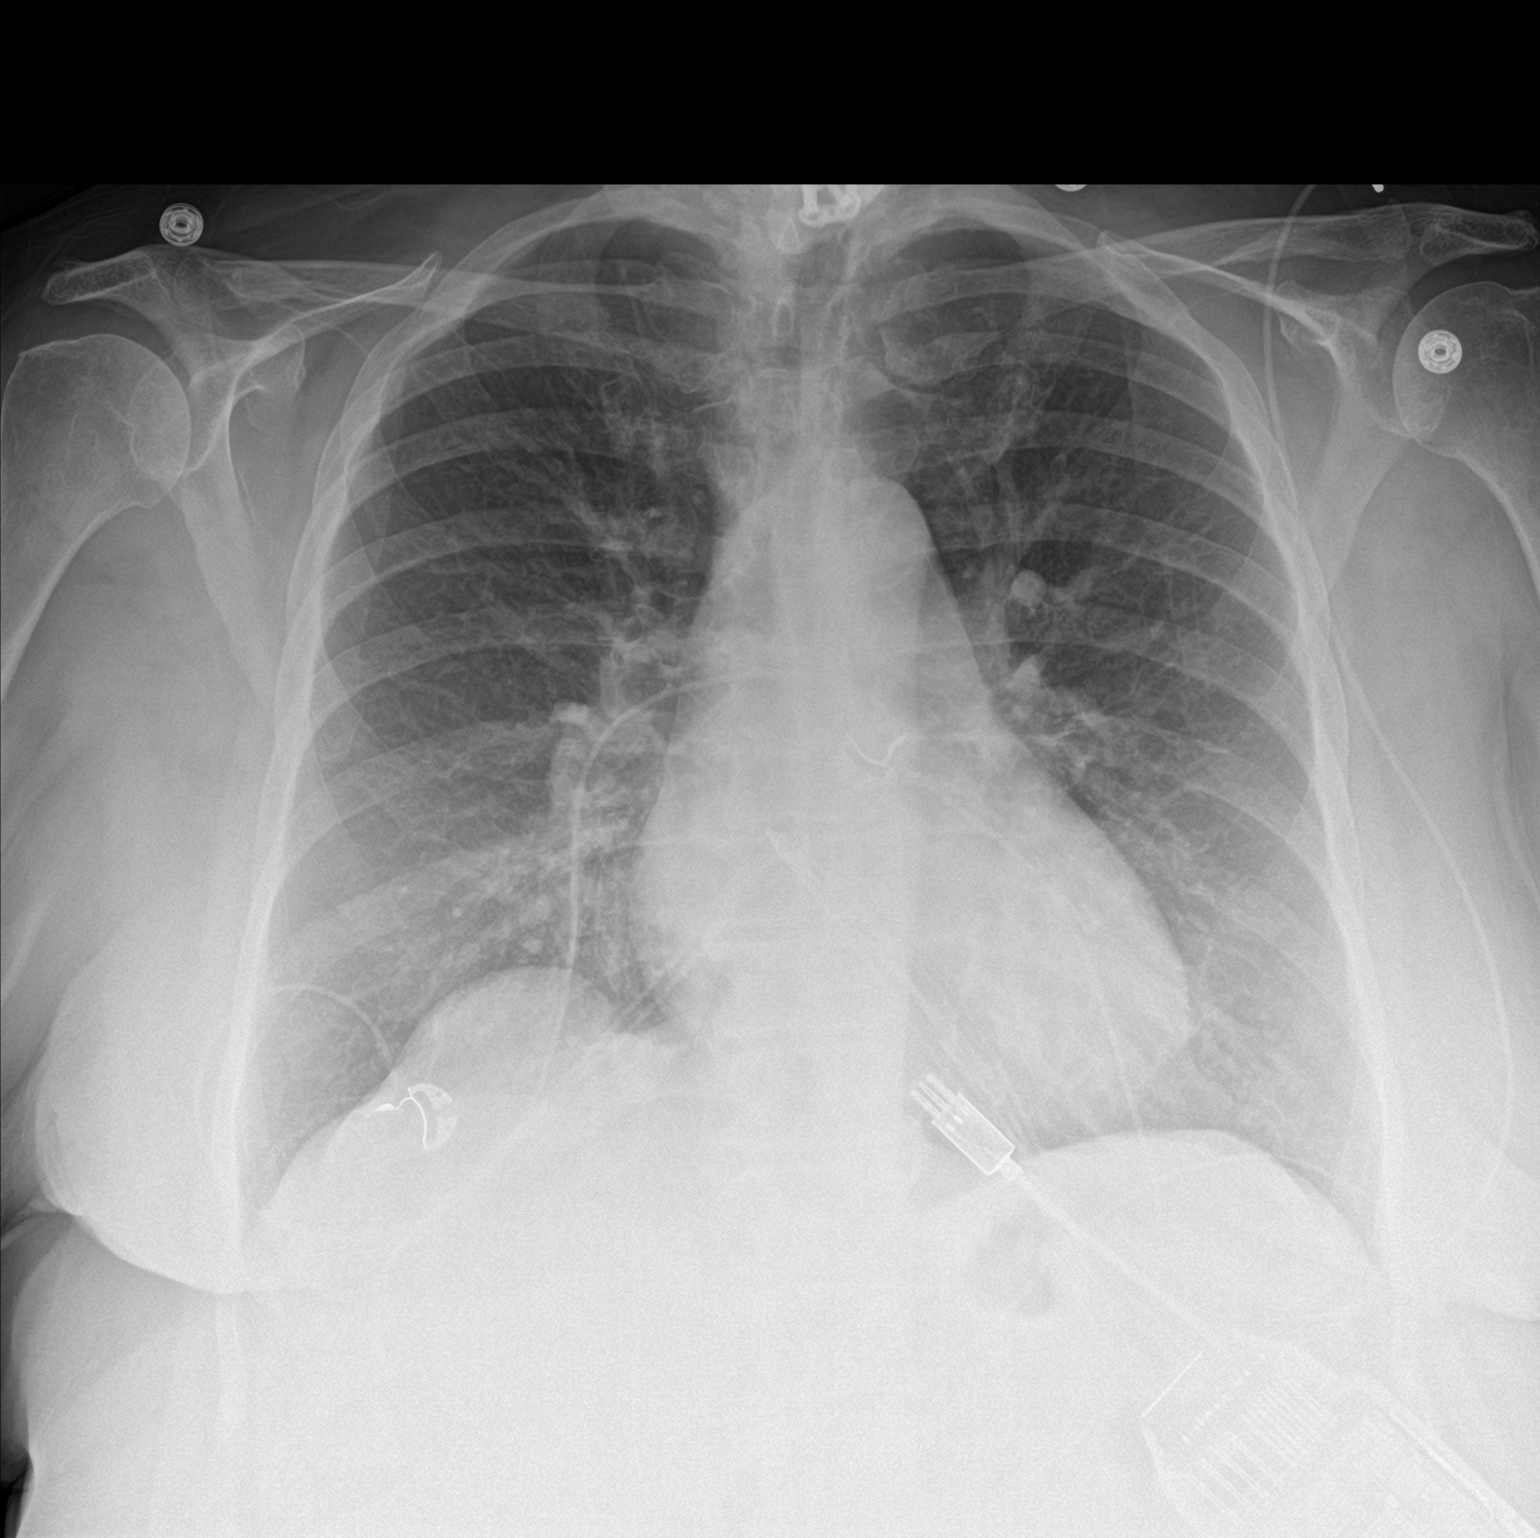

[chest lat]
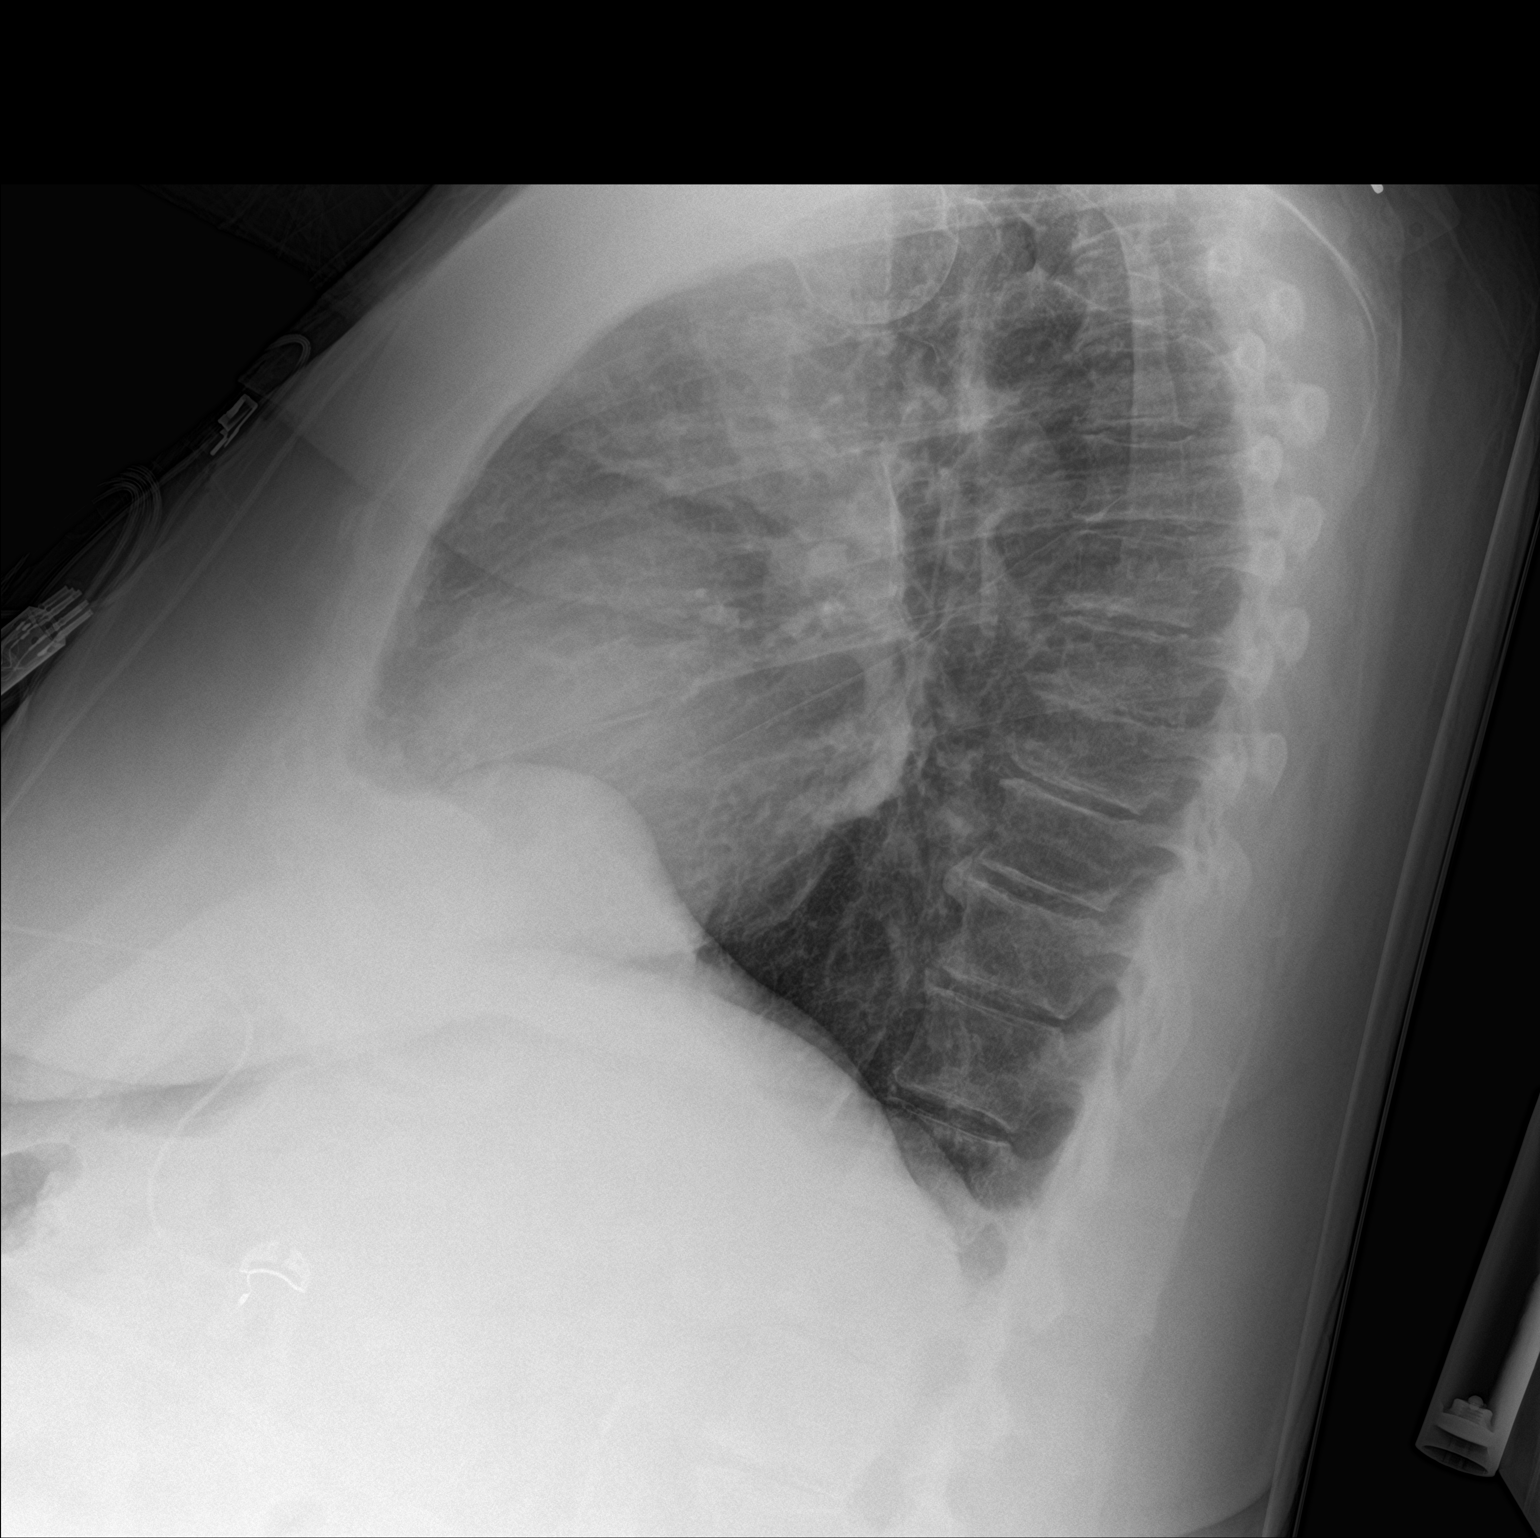

[2 of 2 positions shown; findings below may reference images not displayed]

FINDINGS: Cardiac shadow is within normal limits. The lungs are well aerated
bilaterally. No focal infiltrate or effusion is seen. Degenerative
changes of the thoracic spine are noted. Postsurgical changes in the
cervical spine are seen.
IMPRESSION: No acute abnormality noted.

## 2020-11-03 ENCOUNTER — Inpatient Hospital Stay: Payer: Medicare Other | Attending: Oncology

## 2020-11-03 ENCOUNTER — Inpatient Hospital Stay (HOSPITAL_BASED_OUTPATIENT_CLINIC_OR_DEPARTMENT_OTHER): Payer: Medicare Other | Admitting: Oncology

## 2020-11-03 ENCOUNTER — Other Ambulatory Visit: Payer: Self-pay

## 2020-11-03 ENCOUNTER — Other Ambulatory Visit: Payer: Self-pay | Admitting: Oncology

## 2020-11-03 VITALS — BP 156/48 | HR 69 | Temp 98.1°F | Resp 18 | Ht 63.0 in | Wt 159.7 lb

## 2020-11-03 DIAGNOSIS — N189 Chronic kidney disease, unspecified: Secondary | ICD-10-CM | POA: Diagnosis not present

## 2020-11-03 DIAGNOSIS — K909 Intestinal malabsorption, unspecified: Secondary | ICD-10-CM | POA: Insufficient documentation

## 2020-11-03 DIAGNOSIS — D508 Other iron deficiency anemias: Secondary | ICD-10-CM

## 2020-11-03 DIAGNOSIS — D509 Iron deficiency anemia, unspecified: Secondary | ICD-10-CM | POA: Insufficient documentation

## 2020-11-03 LAB — CMP (CANCER CENTER ONLY)
ALT: 34 U/L (ref 0–44)
AST: 28 U/L (ref 15–41)
Albumin: 3.7 g/dL (ref 3.5–5.0)
Alkaline Phosphatase: 92 U/L (ref 38–126)
Anion gap: 9 (ref 5–15)
BUN: 29 mg/dL — ABNORMAL HIGH (ref 8–23)
CO2: 26 mmol/L (ref 22–32)
Calcium: 9 mg/dL (ref 8.9–10.3)
Chloride: 107 mmol/L (ref 98–111)
Creatinine: 1.53 mg/dL — ABNORMAL HIGH (ref 0.44–1.00)
GFR, Estimated: 34 mL/min — ABNORMAL LOW (ref >60)
Glucose, Bld: 146 mg/dL — ABNORMAL HIGH (ref 70–99)
Potassium: 4.4 mmol/L (ref 3.5–5.1)
Sodium: 142 mmol/L (ref 135–145)
Total Bilirubin: 0.4 mg/dL (ref 0.3–1.2)
Total Protein: 6.9 g/dL (ref 6.5–8.1)

## 2020-11-03 LAB — CBC WITH DIFFERENTIAL (CANCER CENTER ONLY)
Abs Immature Granulocytes: 0.04 10*3/uL (ref 0.00–0.07)
Basophils Absolute: 0 10*3/uL (ref 0.0–0.1)
Basophils Relative: 1 %
Eosinophils Absolute: 0.3 10*3/uL (ref 0.0–0.5)
Eosinophils Relative: 3 %
HCT: 37.6 % (ref 36.0–46.0)
Hemoglobin: 11.5 g/dL — ABNORMAL LOW (ref 12.0–15.0)
Immature Granulocytes: 1 %
Lymphocytes Relative: 23 %
Lymphs Abs: 1.9 10*3/uL (ref 0.7–4.0)
MCH: 28.4 pg (ref 26.0–34.0)
MCHC: 30.6 g/dL (ref 30.0–36.0)
MCV: 92.8 fL (ref 80.0–100.0)
Monocytes Absolute: 0.6 10*3/uL (ref 0.1–1.0)
Monocytes Relative: 7 %
Neutro Abs: 5.6 10*3/uL (ref 1.7–7.7)
Neutrophils Relative %: 65 %
Platelet Count: 169 10*3/uL (ref 150–400)
RBC: 4.05 MIL/uL (ref 3.87–5.11)
RDW: 14.1 % (ref 11.5–15.5)
WBC Count: 8.4 10*3/uL (ref 4.0–10.5)
nRBC: 0 % (ref 0.0–0.2)

## 2020-11-03 LAB — IRON AND TIBC
Iron: 59 ug/dL (ref 41–142)
Saturation Ratios: 23 % (ref 21–57)
TIBC: 260 ug/dL (ref 236–444)
UIBC: 201 ug/dL (ref 120–384)

## 2020-11-03 LAB — FERRITIN: Ferritin: 122 ng/mL (ref 11–307)

## 2020-11-03 NOTE — Progress Notes (Signed)
Hematology and Oncology Follow Up Visit  Doris Jefferson [id number] January 15, 1941 80 y.o. 11/03/2020 8:27 AM Doris Jefferson., PA-CKaplan, Doris Jefferson., PA-C   Principle Diagnosis: 80 year old woman with iron deficiency anemia diagnosed in 2019.  She has element of poor iron absorption and renal insufficiency.   Prior Therapy: Iron infusion completed periodically.  Last treatment given in August 2021.  Current therapy: Repeat IV iron infusion as needed.  Interim History: Doris Jefferson presents today for a follow-up visit.  Since the last visit, she received IV iron infusion in August 2021 with excellent tolerance and improvement in her energy and performance status.  She denies any recent hospitalization or illnesses.  She denies any excessive fatigue tiredness or weakness.  She denies any hematochezia or melena.        Medications: Unchanged on review. Current Outpatient Medications  Medication Sig Dispense Refill  . acetaminophen (TYLENOL) 325 MG tablet Take 650 mg by mouth every 6 (six) hours as needed for moderate pain or headache.    [name] amiodarone (PACERONE) 200 MG tablet Take 1 tablet (200 mg total) by mouth daily. 90 tablet 3  . apixaban (ELIQUIS) 5 MG TABS tablet Take 1 tablet (5 mg total) by mouth 2 (two) times daily. 180 tablet 3  . buPROPion (WELLBUTRIN XL) 300 MG 24 hr tablet Take 300 mg by mouth daily.     . carvedilol (COREG) 25 MG tablet Take 1 tablet (25 mg total) by mouth 2 (two) times daily. 180 tablet 3  . empagliflozin (JARDIANCE) 10 MG TABS tablet Take 1 tablet (10 mg total) by mouth daily before breakfast. 30 tablet 5  . furosemide (LASIX) 20 MG tablet TAKE 1 TABLET(20 MG) BY MOUTH DAILY 90 tablet 3  . liraglutide (VICTOZA) 18 MG/3ML SOPN Inject 1.8 mg into the skin daily.     [name] loperamide (IMODIUM) 2 MG capsule Take 1 capsule (2 mg total) by mouth as needed for diarrhea or loose stools. 30 capsule 0  . magnesium oxide (MAGNESIUM-OXIDE) 400 (241.3 Mg) MG tablet Take 1  tablet (400 mg total) by mouth 2 (two) times daily. 180 tablet 2  . metFORMIN (GLUCOPHAGE) 1000 MG tablet Take 1,000 mg by mouth 2 (two) times a day.    . methocarbamol (ROBAXIN) 500 MG tablet Take 2 tablets (1,000 mg total) by mouth 3 (three) times daily. 180 tablet 1  . Potassium (GNP POTASSIUM) 99 MG TABS Take 99 mg by mouth 3 (three) times daily.     . rosuvastatin (CRESTOR) 20 MG tablet Take 20 mg by mouth daily.    . sildenafil (REVATIO) 20 MG tablet Take 1 tablet (20 mg total) by mouth 3 (three) times daily. 90 tablet 3   No current facility-administered medications for this visit.     Allergies:  Allergies  Allergen Reactions  . Minocin [Minocycline Hcl] Swelling and Other (See Comments)    THROAT SWELLING  . Benazepril Cough  . Hydrochlorothiazide Itching  . Tape Rash       Physical Exam:  Blood pressure (!) 156/48, pulse 69, temperature 98.1 F (36.7 C), temperature source Tympanic, resp. rate 18, height 5\' 3"  (1.6 m), weight 159 lb 11.2 oz (72.4 kg), SpO2 100 %.     ECOG: 1    General appearance: Comfortable appearing without any discomfort Head: Normocephalic without any trauma Oropharynx: Mucous membranes are moist and pink without any thrush or ulcers. Eyes: Pupils are equal and round reactive to light. Lymph nodes: No cervical, supraclavicular, inguinal or axillary  lymphadenopathy.   Heart:regular rate and rhythm.  S1 and S2 without leg edema. Lung: Clear without any rhonchi or wheezes.  No dullness to percussion. Abdomin: Soft, nontender, nondistended with good bowel sounds.  No hepatosplenomegaly. Musculoskeletal: No joint deformity or effusion.  Full range of motion noted. Neurological: No deficits noted on motor, sensory and deep tendon reflex exam. Skin: No petechial rash or dryness.  Appeared moist.          Lab Results: Lab Results  Component Value Date   WBC 7.3 05/05/2020   HGB 9.9 (L) 05/05/2020   HCT 32.5 (L) 05/05/2020   MCV 84.9  05/05/2020   PLT 143 (L) 05/05/2020     Chemistry      Component Value Date/Time   NA 142 07/07/2020 0923   K 4.2 07/07/2020 0923   CL 104 07/07/2020 0923   CO2 22 07/07/2020 0923   BUN 23 07/07/2020 0923   CREATININE 1.43 (H) 07/07/2020 0923   CREATININE 1.34 (H) 02/04/2020 1456      Component Value Date/Time   CALCIUM 9.3 07/07/2020 0923   ALKPHOS 65 07/07/2020 0923   AST 28 07/07/2020 0923   ALT 30 07/07/2020 0923   BILITOT 0.3 07/07/2020 0923       Impression and Plan:  80 year old woman with the following:  1.    Iron deficiency anemia diagnosed in 2019.  She has element of chronic renal disease anemia.   The natural course of her disease was reviewed today and treatment options were discussed.  Laboratory data in the last 6 months were also reiterated.  Repeat intravenous iron as well as growth factor support in the form of Aranesp or Procrit remain an option for her at this time.  Last IV iron infusion in August 2021 was well-tolerated without any complications.  Infusion related issues including arthralgias, myalgias and rarely anaphylaxis remain the main complications.   Iron studies are currently pending at this time and pending these results we will determine whether she needs a repeat iron infusion currently.  Her hemoglobin today has improved significantly and nearly normalized.  We will continue to monitor for the time being.   2.  Follow-up: She will return in 6 months for repeat follow-up.  30  minutes were d spent on this encounter.  The time was dedicated to reviewing her disease status, discussing treatment options and complications noted to therapy.     Zola Button, MD 1/26/20228:27 AM

## 2020-12-10 ENCOUNTER — Other Ambulatory Visit: Payer: Self-pay

## 2020-12-10 ENCOUNTER — Encounter: Payer: Self-pay | Admitting: Cardiology

## 2020-12-10 ENCOUNTER — Ambulatory Visit (INDEPENDENT_AMBULATORY_CARE_PROVIDER_SITE_OTHER): Payer: Medicare Other | Admitting: Cardiology

## 2020-12-10 VITALS — BP 164/62 | HR 69 | Ht 63.0 in | Wt 163.0 lb

## 2020-12-10 DIAGNOSIS — E669 Obesity, unspecified: Secondary | ICD-10-CM

## 2020-12-10 DIAGNOSIS — G4733 Obstructive sleep apnea (adult) (pediatric): Secondary | ICD-10-CM | POA: Diagnosis not present

## 2020-12-10 DIAGNOSIS — I10 Essential (primary) hypertension: Secondary | ICD-10-CM | POA: Diagnosis not present

## 2020-12-10 NOTE — Patient Instructions (Signed)
Medication Instructions:  Your provider recommends that you continue on your current medications as directed. Please refer to the Current Medication list given to you today.   *If you need a refill on your cardiac medications before your next appointment, please call your pharmacy*   Follow-Up: At [facility], you and your health needs are our priority.  As part of our continuing mission to provide you with exceptional heart care, we have created designated Provider Care Teams.  These Care Teams include your primary Cardiologist (physician) and Advanced Practice Providers (APPs -  Physician Assistants and Nurse Practitioners) who all work together to provide you with the care you need, when you need it. Your next appointment:   12 month(s) The format for your next appointment:   In Person Provider:   Fransico Him, MD

## 2020-12-10 NOTE — Progress Notes (Addendum)
Date:  12/10/2020   ID:  Doris Jefferson, DOB Feb 15, 1941, MRN [id number]  PCP:  Aletha Halim., PA-C  Cardiologist:  Sinclair Grooms, MD  Sleep Medicine:  Fransico Him, MD Electrophysiologist:  None   Chief Complaint:  OSA  History of Present Illness:    Doris Jefferson is a 80 y.o. female with a hx of PAF, pulmonary HTN, HTN, obesity and OSA on CPAP. She is doing well with her CPAP device and thinks that she has gotten used to it.  She tolerates the mask and feels the pressure is adequate.  Since going on CPAP she feels rested in the am and has no significant daytime sleepiness.  She has mild mouth  dryness.  He does not think that he snores.    Prior CV studies:   The following studies were reviewed today:  PAP compliance download  Past Medical History:  Diagnosis Date  . Coronary artery disease   . Depression   . GERD (gastroesophageal reflux disease)   . Heart murmur   . HTN (hypertension)   . Hyperlipidemia   . Iron deficiency anemia   . Obesity (BMI 30-39.9) 03/04/2014  . OSA on CPAP    severe with AHI 31/hr  . PAF (paroxysmal atrial fibrillation) ([place])   . Pneumonia    "twice; years ago" (09/24/2018)  . Skin cancer    "left neck; burned off"  . Type II diabetes mellitus ([place])    Past Surgical History:  Procedure Laterality Date  . ABDOMINAL HYSTERECTOMY    . ANTERIOR CERVICAL DECOMP/DISCECTOMY FUSION     "cage around my neck; Dr. Erline Levine"  . AORTIC VALVE REPLACEMENT N/A 09/27/2018   Procedure: AORTIC VALVE REPLACEMENT (AVR) USING MAGNA EASE SIZE 21 MM;  Surgeon: Grace Isaac, MD;  Location: [place];  Service: Open Heart Surgery;  Laterality: N/A;  . BACK SURGERY    . CARDIAC CATHETERIZATION  09/24/2018  . CARDIOVERSION N/A 12/19/2013   Procedure: CARDIOVERSION;  Surgeon: Sinclair Grooms, MD;  Location: [facility] ENDOSCOPY;  Service: Cardiovascular;  Laterality: N/A;  . CARDIOVERSION N/A 08/16/2015   Procedure: CARDIOVERSION;  Surgeon: Larey Dresser, MD;   Location: [place];  Service: Cardiovascular;  Laterality: N/A;  . CARDIOVERSION N/A 09/13/2015   Procedure: CARDIOVERSION;  Surgeon: Sueanne Margarita, MD;  Location: [facility] ENDOSCOPY;  Service: Cardiovascular;  Laterality: N/A;  . CARDIOVERSION N/A 10/07/2018   Procedure: CARDIOVERSION;  Surgeon: Sanda Klein, MD;  Location: [facility] ENDOSCOPY;  Service: Cardiovascular;  Laterality: N/A;  . CATARACT EXTRACTION W/ INTRAOCULAR LENS  IMPLANT, BILATERAL Bilateral   . CLIPPING OF ATRIAL APPENDAGE N/A 09/27/2018   Procedure: CLIPPING OF ATRIAL APPENDAGE USING ATRICURE FLEXV SIZE 35MM;  Surgeon: Grace Isaac, MD;  Location: [place];  Service: Open Heart Surgery;  Laterality: N/A;  . COLONOSCOPY WITH PROPOFOL N/A 11/13/2017   Procedure: COLONOSCOPY WITH PROPOFOL;  Surgeon: Wilford Corner, MD;  [facility] ENDOS[facility];  Service: Endoscopy;  Laterality: N/A;  ANTIBIOTIC NEEDED BEFORE PROCEDURE  . CORONARY ARTERY BYPASS GRAFT N/A 09/27/2018   Procedure: CORONARY ARTERY BYPASS GRAFTING (CABG) x three, using left internal mammary artery and right leg greater saphenous vein harvested endoscopically;  Surgeon: Grace Isaac, MD;  Location: [place];  Service: Open Heart Surgery;  Laterality: N/A;  . ESOPHAGOGASTRODUODENOSCOPY (EGD) WITH PROPOFOL N/A 11/13/2017   Procedure: ESOPHAGOGASTRODUODENOSCOPY (EGD) WITH PROPOFOL;  Surgeon: Wilford Corner, MD;  [facility] ENDOSCOPY;  Service: Endoscopy;  Laterality: N/A;  . EYE SURGERY    .  IR FLUORO GUIDED NEEDLE PLC ASPIRATION/INJECTION LOC  12/05/2019  . RIGHT/LEFT HEART CATH AND CORONARY ANGIOGRAPHY N/A 09/24/2018   Procedure: RIGHT/LEFT HEART CATH AND CORONARY ANGIOGRAPHY;  Surgeon: Jettie Booze, MD;  Location: [place] CV LAB;  Service: Cardiovascular;  Laterality: N/A;  . TEE WITHOUT CARDIOVERSION N/A 09/27/2018   Procedure: TRANSESOPHAGEAL ECHOCARDIOGRAM (TEE);  Surgeon: Grace Isaac, MD;  Location: [place];  Service: Open Heart Surgery;   Laterality: N/A;  . TUBAL LIGATION       Current Meds  Medication Sig  . acetaminophen (TYLENOL) 325 MG tablet Take 650 mg by mouth every 6 (six) hours as needed for moderate pain or headache.  [name] amiodarone (PACERONE) 200 MG tablet Take 1 tablet (200 mg total) by mouth daily.  [name] apixaban (ELIQUIS) 5 MG TABS tablet Take 1 tablet (5 mg total) by mouth 2 (two) times daily.  [name] buPROPion (WELLBUTRIN XL) 300 MG 24 hr tablet Take 300 mg by mouth daily.   . carvedilol (COREG) 25 MG tablet Take 1 tablet (25 mg total) by mouth 2 (two) times daily.  . empagliflozin (JARDIANCE) 10 MG TABS tablet Take 1 tablet (10 mg total) by mouth daily before breakfast.  . furosemide (LASIX) 20 MG tablet TAKE 1 TABLET(20 MG) BY MOUTH DAILY  . liraglutide (VICTOZA) 18 MG/3ML SOPN Inject 1.8 mg into the skin daily.   [name] loperamide (IMODIUM) 2 MG capsule Take 1 capsule (2 mg total) by mouth as needed for diarrhea or loose stools.  . magnesium oxide (MAGNESIUM-OXIDE) 400 (241.3 Mg) MG tablet Take 1 tablet (400 mg total) by mouth 2 (two) times daily.  . metFORMIN (GLUCOPHAGE) 1000 MG tablet Take 1,000 mg by mouth 2 (two) times a day.  . methocarbamol (ROBAXIN) 500 MG tablet Take 2 tablets (1,000 mg total) by mouth 3 (three) times daily.  . Potassium 99 MG TABS Take 99 mg by mouth 3 (three) times daily.   . rosuvastatin (CRESTOR) 20 MG tablet Take 20 mg by mouth daily.  . sildenafil (REVATIO) 20 MG tablet Take 1 tablet (20 mg total) by mouth 3 (three) times daily.     Allergies:   Minocin [minocycline hcl], Benazepril, Hydrochlorothiazide, and Tape   Social History   Tobacco Use  . Smoking status: Never Smoker  . Smokeless tobacco: Never Used  Vaping Use  . Vaping Use: Never used  Substance Use Topics  . Alcohol use: Never  . Drug use: Never     Family Hx: The patient's family history includes Heart disease in her father; Hypertension in her brother and father; Melanoma in her mother.  ROS:   Please see the  history of present illness.     All other systems reviewed and are negative.   Labs/Other Tests and Data Reviewed:    Recent Labs: 07/07/2020: TSH 2.340 11/03/2020: ALT 34; BUN 29; Creatinine 1.53; Hemoglobin 11.5; Platelet Count 169; Potassium 4.4; Sodium 142   Recent Lipid Panel Lab Results  Component Value Date/Time   CHOL 145 05/08/2018 08:20 AM   TRIG 152 (H) 05/08/2018 08:20 AM   HDL 43 05/08/2018 08:20 AM   CHOLHDL 3.4 05/08/2018 08:20 AM   LDLCALC 72 05/08/2018 08:20 AM    Wt Readings from Last 3 Encounters:  12/10/20 163 lb (73.9 kg)  11/03/20 159 lb 11.2 oz (72.4 kg)  05/05/20 155 lb 12.8 oz (70.7 kg)     Objective:    Vital Signs:  BP (!) 164/62   Pulse 69   Ht  5\' 3"  (1.6 m)   Wt 163 lb (73.9 kg)   LMP  (LMP Unknown)   SpO2 97%   BMI 28.87 kg/m    GEN: Well nourished, well developed in no acute distress HEENT: Normal NECK: No JVD; No carotid bruits LYMPHATICS: No lymphadenopathy CARDIAC:RRR, no  rubs, gallops.  2/6 SM at RUSB to LLSB and left carotid RESPIRATORY:  Clear to auscultation without rales, wheezing or rhonchi  ABDOMEN: Soft, non-tender, non-distended MUSCULOSKELETAL:  No edema; No deformity  SKIN: Warm and dry NEUROLOGIC:  Alert and oriented x 3 PSYCHIATRIC:  Normal affect   ASSESSMENT & PLAN:    1.  OSA - The patient is tolerating PAP therapy well without any problems. The PAP download was reviewed today and showed an AHI of 1.3/hr on 14 cm H2O with 93% compliance in using more than 4 hours nightly.  The patient has been using and benefiting from PAP use and will continue to benefit from therapy.   2.  Obesity  -I have encouraged her to get into a routine exercise program and cut back on carbs and portions.   3.  HTN -Bp borderline controlled on exam today -continue Carvedilol 25mg  BID>>she just took her med < 30 minutes ago   COVID-19 Education: The signs and symptoms of COVID-19 were discussed with the patient and how to seek care  for testing (follow up with PCP or arrange E-visit).  The importance of social distancing was discussed today.  Patient Risk:   After full review of this patient's clinical status, I feel that they are at least moderate risk at this time.  Time:   Today, I have spent 20 minutes  on telemedicine discussing medical problems including OSA, HTN, obesity and reviewing patient's chart including PAP compliance.  Medication Adjustments/Labs and Tests Ordered: Current medicines are reviewed at length with the patient today.  Concerns regarding medicines are outlined above.  Tests Ordered: No orders of the defined types were placed in this encounter.  Medication Changes: No orders of the defined types were placed in this encounter.   Disposition:  Follow up with me in 10 weeks after getting her device  Signed, Fransico Him, MD  12/10/2020 10:49 AM    [place]

## 2021-01-12 NOTE — Progress Notes (Signed)
Cardiology Office Note:    Date:  01/13/2021   ID:  DARLISHA KELM, DOB April 24, 1941, MRN [id number]  PCP:  Aletha Halim., PA-C  Cardiologist:  Sinclair Grooms, MD   Referring MD: Aletha Halim., PA-C   No chief complaint on file.   History of Present Illness:    Doris Jefferson is a 80 y.o. female with a hx of paroxysmal atrial fibrillation, OSA, hypertension, hyperlipidemia, aortic stenosis, type II DM, and recentAVR(bioprosthesis), LAA clip, and 3 vessel CABG (with LIMA to LAD, SVG to OM, and SVG to RCA) 09/27/18. Required cardioversion and IV amio loading prior to DC (12/30).Developed streptococcal bacteremia with thoracic and lumbar discitis in February 2021. Converted on PO amiodarone load.  She is doing well.  She feels her heart is in rhythm.  She is not short of breath or weak as she usually is when A. fib occurs.  States that her blood pressures have been on a roller coaster ride ranging between 130 and 180.  Usually a systolic pressure is a less than 160.  She did have her medication this morning except for Lasix.  No medication side effects.  Past Medical History:  Diagnosis Date  . Coronary artery disease   . Depression   . GERD (gastroesophageal reflux disease)   . Heart murmur   . HTN (hypertension)   . Hyperlipidemia   . Iron deficiency anemia   . Obesity (BMI 30-39.9) 03/04/2014  . OSA on CPAP    severe with AHI 31/hr  . PAF (paroxysmal atrial fibrillation) ([place])   . Pneumonia    "twice; years ago" (09/24/2018)  . Skin cancer    "left neck; burned off"  . Type II diabetes mellitus ([place])     Past Surgical History:  Procedure Laterality Date  . ABDOMINAL HYSTERECTOMY    . ANTERIOR CERVICAL DECOMP/DISCECTOMY FUSION     "cage around my neck; Dr. Erline Levine"  . AORTIC VALVE REPLACEMENT N/A 09/27/2018   Procedure: AORTIC VALVE REPLACEMENT (AVR) USING MAGNA EASE SIZE 21 MM;  Surgeon: Grace Isaac, MD;  Location: [place];  Service: Open Heart  Surgery;  Laterality: N/A;  . BACK SURGERY    . CARDIAC CATHETERIZATION  09/24/2018  . CARDIOVERSION N/A 12/19/2013   Procedure: CARDIOVERSION;  Surgeon: Sinclair Grooms, MD;  Location: [facility] ENDOSCOPY;  Service: Cardiovascular;  Laterality: N/A;  . CARDIOVERSION N/A 08/16/2015   Procedure: CARDIOVERSION;  Surgeon: Larey Dresser, MD;  Location: [place];  Service: Cardiovascular;  Laterality: N/A;  . CARDIOVERSION N/A 09/13/2015   Procedure: CARDIOVERSION;  Surgeon: Sueanne Margarita, MD;  Location: [facility] ENDOSCOPY;  Service: Cardiovascular;  Laterality: N/A;  . CARDIOVERSION N/A 10/07/2018   Procedure: CARDIOVERSION;  Surgeon: Sanda Klein, MD;  Location: [facility] ENDOSCOPY;  Service: Cardiovascular;  Laterality: N/A;  . CATARACT EXTRACTION W/ INTRAOCULAR LENS  IMPLANT, BILATERAL Bilateral   . CLIPPING OF ATRIAL APPENDAGE N/A 09/27/2018   Procedure: CLIPPING OF ATRIAL APPENDAGE USING ATRICURE FLEXV SIZE 35MM;  Surgeon: Grace Isaac, MD;  Location: [place];  Service: Open Heart Surgery;  Laterality: N/A;  . COLONOSCOPY WITH PROPOFOL N/A 11/13/2017   Procedure: COLONOSCOPY WITH PROPOFOL;  Surgeon: Wilford Corner, MD;  Location: [facility] ENDOSCOPY;  Service: Endoscopy;  Laterality: N/A;  ANTIBIOTIC NEEDED BEFORE PROCEDURE  . CORONARY ARTERY BYPASS GRAFT N/A 09/27/2018   Procedure: CORONARY ARTERY BYPASS GRAFTING (CABG) x three, using left internal mammary artery and right leg greater saphenous vein harvested endoscopically;  Surgeon:  Grace Isaac, MD;  Location: [place];  Service: Open Heart Surgery;  Laterality: N/A;  . ESOPHAGOGASTRODUODENOSCOPY (EGD) WITH PROPOFOL N/A 11/13/2017   Procedure: ESOPHAGOGASTRODUODENOSCOPY (EGD) WITH PROPOFOL;  Surgeon: Wilford Corner, MD;  Location: [facility] ENDOSCOPY;  Service: Endoscopy;  Laterality: N/A;  . EYE SURGERY    . IR FLUORO GUIDED NEEDLE PLC ASPIRATION/INJECTION LOC  12/05/2019  . RIGHT/LEFT HEART CATH AND CORONARY ANGIOGRAPHY N/A 09/24/2018   Procedure:  RIGHT/LEFT HEART CATH AND CORONARY ANGIOGRAPHY;  Surgeon: Jettie Booze, MD;  Location: [place] [facility];  Service: Cardiovascular;  Laterality: N/A;  . TEE WITHOUT CARDIOVERSION N/A 09/27/2018   Procedure: TRANSESOPHAGEAL ECHOCARDIOGRAM (TEE);  Surgeon: Grace Isaac, MD;  Location: [place];  Service: Open Heart Surgery;  Laterality: N/A;  . TUBAL LIGATION      Current Medications: Current Meds  Medication Sig  . acetaminophen (TYLENOL) 325 MG tablet Take 650 mg by mouth every 6 (six) hours as needed for moderate pain or headache.  [name] amiodarone (PACERONE) 200 MG tablet Take 1 tablet (200 mg total) by mouth daily.  [name] apixaban (ELIQUIS) 5 MG TABS tablet Take 1 tablet (5 mg total) by mouth 2 (two) times daily.  [name] buPROPion (WELLBUTRIN XL) 300 MG 24 hr tablet Take 300 mg by mouth daily.   . empagliflozin (JARDIANCE) 10 MG TABS tablet Take 1 tablet (10 mg total) by mouth daily before breakfast.  . furosemide (LASIX) 20 MG tablet TAKE 1 TABLET(20 MG) BY MOUTH DAILY  . liraglutide (VICTOZA) 18 MG/3ML SOPN Inject 1.8 mg into the skin daily.   [name] loperamide (IMODIUM) 2 MG capsule Take 1 capsule (2 mg total) by mouth as needed for diarrhea or loose stools.  . magnesium oxide (MAGNESIUM-OXIDE) 400 (241.3 Mg) MG tablet Take 1 tablet (400 mg total) by mouth 2 (two) times daily.  . metFORMIN (GLUCOPHAGE) 1000 MG tablet Take 1,000 mg by mouth 2 (two) times a day.  . Potassium 99 MG TABS Take 99 mg by mouth 3 (three) times daily.   . rosuvastatin (CRESTOR) 20 MG tablet Take 20 mg by mouth daily.  . sildenafil (REVATIO) 20 MG tablet Take 1 tablet (20 mg total) by mouth 3 (three) times daily.  . [DISCONTINUED] methocarbamol (ROBAXIN) 500 MG tablet Take 2 tablets (1,000 mg total) by mouth 3 (three) times daily.     Allergies:   Minocin [minocycline hcl], Benazepril, Hydrochlorothiazide, and Tape   Social History   Socioeconomic History  . Marital status: Married    Spouse name: Thomsa  .  Number of children: Not on file  . Years of education: 84  . Highest education level: High school graduate  Occupational History  . Occupation: Retired  Tobacco Use  . Smoking status: Never Smoker  . Smokeless tobacco: Never Used  Vaping Use  . Vaping Use: Never used  Substance and Sexual Activity  . Alcohol use: Never  . Drug use: Never  . Sexual activity: Not Currently  Other Topics Concern  . Not on file  Social History Narrative  . Not on file   Social Determinants of Health   Financial Resource Strain: Not on file  Food Insecurity: Not on file  Transportation Needs: Not on file  Physical Activity: Not on file  Stress: Not on file  Social Connections: Not on file     Family History: The patient's family history includes Heart disease in her father; Hypertension in her brother and father; Melanoma in her mother.  ROS:  Please see the history of present illness.    No bleeding.  No neurological complaints.  All other systems reviewed and are negative.  EKGs/Labs/Other Studies Reviewed:    The following studies were reviewed today: No recent imaging  2D Doppler echocardiogram done in 2021: IMPRESSIONS    1. Left ventricular ejection fraction, by estimation, is 60 to 65%. The  left ventricle has normal function. The left ventricle has no regional  wall motion abnormalities. Left ventricular diastolic parameters are  consistent with Grade II diastolic  dysfunction (pseudonormalization). Elevated left ventricular end-diastolic  pressure.  2. Right ventricular systolic function is mildly reduced. The right  ventricular size is normal. There is moderately elevated pulmonary artery  systolic pressure.  3. The mitral valve is normal in structure and function. Mild mitral  valve regurgitation. No evidence of mitral stenosis.  4. The aortic valve is tricuspid. Aortic valve regurgitation is not  visualized. Mild aortic valve stenosis. There is a 21 mm valve  present in  the aortic position. Procedure Date: 09/27/2018.  5. The inferior vena cava is normal in size with greater than 50%  respiratory variability, suggesting right atrial pressure of 3 mmHg.   EKG:  EKG not performed today  Recent Labs: 07/07/2020: TSH 2.340 11/03/2020: ALT 34; BUN 29; Creatinine 1.53; Hemoglobin 11.5; Platelet Count 169; Potassium 4.4; Sodium 142  Recent Lipid Panel    Component Value Date/Time   CHOL 145 05/08/2018 0820   TRIG 152 (H) 05/08/2018 0820   HDL 43 05/08/2018 0820   CHOLHDL 3.4 05/08/2018 0820   LDLCALC 72 05/08/2018 0820    Physical Exam:    VS:  BP (!) 164/60 (BP Location: Right Leg, Patient Position: Sitting, Cuff Size: Normal)   Pulse 66   Ht 5\' 3"  (1.6 m)   Wt 164 lb (74.4 kg)   LMP  (LMP Unknown)   SpO2 99%   BMI 29.05 kg/m     Wt Readings from Last 3 Encounters:  01/13/21 164 lb (74.4 kg)  12/10/20 163 lb (73.9 kg)  11/03/20 159 lb 11.2 oz (72.4 kg)     GEN: Elderly but sturdy appearing. No acute distress HEENT: Normal NECK: No JVD. LYMPHATICS: No lymphadenopathy CARDIAC: 2/6 systolic murmur.  No diastolic murmur is heard.  RRR no gallop, or edema. VASCULAR:  Normal Pulses. No bruits. RESPIRATORY:  Clear to auscultation without rales, wheezing or rhonchi  ABDOMEN: Soft, non-tender, non-distended, No pulsatile mass, MUSCULOSKELETAL: No deformity  SKIN: Warm and dry NEUROLOGIC:  Alert and oriented x 3 PSYCHIATRIC:  Normal affect   ASSESSMENT:    1. Persistent atrial fibrillation ([place])   2. Primary hypertension   3. OSA (obstructive sleep apnea)   4. Coronary artery disease involving coronary bypass graft of native heart with angina pectoris ([place])   5. Chronic diastolic heart failure ([place])   6. Anticoagulation goal of INR 2 to 3   7. Diabetes mellitus type 2 in obese ([place])   8. On amiodarone therapy    PLAN:    In order of problems listed above:  1. In sinus rhythm based upon clinical exam. 2. Systolic pressure  is elevated.  Target is less than 150 and ideally 140/80 or less.  2 g sodium diet.  Monitor at home and let us know if consistently running above 150 mmHg. 3. Compliance with sleep apnea is recommended. 4. Continue secondary prevention efforts. 5. No clinical evidence of volume overload. 6. Continue Eliquis 5 mg twice daily.  Most recent  hemoglobin and creatinine were normal 7. Hemoglobin A1c target less than 7.  Most recently 6.9. 8. Continue amiodarone 200 mg/day.  Liver panel TSH in 6 months.  Overall education and awareness concerning secondary risk prevention was discussed in detail: LDL less than 70, hemoglobin A1c less than 7, blood pressure target less than 130/80 mmHg, >150 minutes of moderate aerobic activity per week, avoidance of smoking, weight control (via diet and exercise), and continued surveillance/management of/for obstructive sleep apnea.    Medication Adjustments/Labs and Tests Ordered: Current medicines are reviewed at length with the patient today.  Concerns regarding medicines are outlined above.  No orders of the defined types were placed in this encounter.  No orders of the defined types were placed in this encounter.   Patient Instructions   Medication Instructions:  Your physician recommends that you continue on your current medications as directed. Please refer to the Current Medication list given to you today.  *If you need a refill on your cardiac medications before your next appointment, please call your pharmacy*   Lab Work: None If you have labs (blood work) drawn today and your tests are completely normal, you will receive your results only by: [name] MyChart Message (if you have MyChart) OR . A paper copy in the mail If you have any lab test that is abnormal or we need to change your treatment, we will call you to review the results.   Testing/Procedures: None   Follow-Up: At [facility], you and your health needs are our priority.  As part  of our continuing mission to provide you with exceptional heart care, we have created designated Provider Care Teams.  These Care Teams include your primary Cardiologist (physician) and Advanced Practice Providers (APPs -  Physician Assistants and Nurse Practitioners) who all work together to provide you with the care you need, when you need it.  We recommend signing up for the patient portal called "MyChart".  Sign up information is provided on this After Visit Summary.  MyChart is used to connect with patients for Virtual Visits (Telemedicine).  Patients are able to view lab/test results, encounter notes, upcoming appointments, etc.  Non-urgent messages can be sent to your provider as well.   To learn more about what you can do with MyChart, go to [email or web address].    Your next appointment:   6 month(s)  The format for your next appointment:   In Person  Provider:   You may see Sinclair Grooms, MD or one of the following Advanced Practice Providers on your designated Care Team:    Kathyrn Drown, NP    Other Instructions  Monitor your blood pressure and let us know if the top number is consistently over 150.   Low-Sodium Eating Plan Sodium, which is an element that makes up salt, helps you maintain a healthy balance of fluids in your body. Too much sodium can increase your blood pressure and cause fluid and waste to be held in your body. Your health care provider or dietitian may recommend following this plan if you have high blood pressure (hypertension), kidney disease, liver disease, or heart failure. Eating less sodium can help lower your blood pressure, reduce swelling, and protect your heart, liver, and kidneys. What are tips for following this plan? Reading food labels  The Nutrition Facts label lists the amount of sodium in one serving of the food. If you eat more than one serving, you must multiply the listed amount of sodium by the number  of servings.  Choose foods  with less than 140 mg of sodium per serving.  Avoid foods with 300 mg of sodium or more per serving. Shopping  Look for lower-sodium products, often labeled as "low-sodium" or "no salt added."  Always check the sodium content, even if foods are labeled as "unsalted" or "no salt added."  Buy fresh foods. ? Avoid canned foods and pre-made or frozen meals. ? Avoid canned, cured, or processed meats.  Buy breads that have less than 80 mg of sodium per slice.   Cooking  Eat more home-cooked food and less restaurant, buffet, and fast food.  Avoid adding salt when cooking. Use salt-free seasonings or herbs instead of table salt or sea salt. Check with your health care provider or pharmacist before using salt substitutes.  Cook with plant-based oils, such as canola, sunflower, or olive oil.   Meal planning  When eating at a restaurant, ask that your food be prepared with less salt or no salt, if possible. Avoid dishes labeled as brined, pickled, cured, smoked, or made with soy sauce, miso, or teriyaki sauce.  Avoid foods that contain MSG (monosodium glutamate). MSG is sometimes added to [place] food, bouillon, and some canned foods.  Make meals that can be grilled, baked, poached, roasted, or steamed. These are generally made with less sodium. General information Most people on this plan should limit their sodium intake to 1,500-2,000 mg (milligrams) of sodium each day. What foods should I eat? Fruits Fresh, frozen, or canned fruit. Fruit juice. Vegetables Fresh or frozen vegetables. "No salt added" canned vegetables. "No salt added" tomato sauce and paste. Low-sodium or reduced-sodium tomato and vegetable juice. Grains Low-sodium cereals, including oats, puffed wheat and rice, and shredded wheat. Low-sodium crackers. Unsalted rice. Unsalted pasta. Low-sodium bread. Whole-grain breads and whole-grain pasta. Meats and other proteins Fresh or frozen (no salt added) meat, poultry, seafood,  and fish. Low-sodium canned tuna and salmon. Unsalted nuts. Dried peas, beans, and lentils without added salt. Unsalted canned beans. Eggs. Unsalted nut butters. Dairy Milk. Soy milk. Cheese that is naturally low in sodium, such as ricotta cheese, fresh mozzarella, or Swiss cheese. Low-sodium or reduced-sodium cheese. Cream cheese. Yogurt. Seasonings and condiments Fresh and dried herbs and spices. Salt-free seasonings. Low-sodium mustard and ketchup. Sodium-free salad dressing. Sodium-free light mayonnaise. Fresh or refrigerated horseradish. Lemon juice. Vinegar. Other foods Homemade, reduced-sodium, or low-sodium soups. Unsalted popcorn and pretzels. Low-salt or salt-free chips. The items listed above may not be a complete list of foods and beverages you can eat. Contact a dietitian for more information. What foods should I avoid? Vegetables Sauerkraut, pickled vegetables, and relishes. Olives. [place] fries. Onion rings. Regular canned vegetables (not low-sodium or reduced-sodium). Regular canned tomato sauce and paste (not low-sodium or reduced-sodium). Regular tomato and vegetable juice (not low-sodium or reduced-sodium). Frozen vegetables in sauces. Grains Instant hot cereals. Bread stuffing, pancake, and biscuit mixes. Croutons. Seasoned rice or pasta mixes. Noodle soup cups. Boxed or frozen macaroni and cheese. Regular salted crackers. Self-rising flour. Meats and other proteins Meat or fish that is salted, canned, smoked, spiced, or pickled. Precooked or cured meat, such as sausages or meat loaves. Berniece Salines. Ham. Pepperoni. Hot dogs. Corned beef. Chipped beef. Salt pork. Jerky. Pickled herring. Anchovies and sardines. Regular canned tuna. Salted nuts. Dairy Processed cheese and cheese spreads. Hard cheeses. Cheese curds. Blue cheese. Feta cheese. String cheese. Regular cottage cheese. Buttermilk. Canned milk. Fats and oils Salted butter. Regular margarine. Ghee. Bacon fat. Seasonings and  condiments  Onion salt, garlic salt, seasoned salt, table salt, and sea salt. Canned and packaged gravies. Worcestershire sauce. Tartar sauce. Barbecue sauce. Teriyaki sauce. Soy sauce, including reduced-sodium. Steak sauce. Fish sauce. Oyster sauce. Cocktail sauce. Horseradish that you find on the shelf. Regular ketchup and mustard. Meat flavorings and tenderizers. Bouillon cubes. Hot sauce. Pre-made or packaged marinades. Pre-made or packaged taco seasonings. Relishes. Regular salad dressings. Salsa. Other foods Salted popcorn and pretzels. Corn chips and puffs. Potato and tortilla chips. Canned or dried soups. Pizza. Frozen entrees and pot pies. The items listed above may not be a complete list of foods and beverages you should avoid. Contact a dietitian for more information. Summary  Eating less sodium can help lower your blood pressure, reduce swelling, and protect your heart, liver, and kidneys.  Most people on this plan should limit their sodium intake to 1,500-2,000 mg (milligrams) of sodium each day.  Canned, boxed, and frozen foods are high in sodium. Restaurant foods, fast foods, and pizza are also very high in sodium. You also get sodium by adding salt to food.  Try to cook at home, eat more fresh fruits and vegetables, and eat less fast food and canned, processed, or prepared foods. This information is not intended to replace advice given to you by your health care provider. Make sure you discuss any questions you have with your health care provider. Document Revised: 10/31/2019 Document Reviewed: 08/27/2019 Elsevier Patient Education  2021 [place], Sinclair Grooms, MD  01/13/2021 11:11 AM    [place]

## 2021-01-13 ENCOUNTER — Ambulatory Visit (INDEPENDENT_AMBULATORY_CARE_PROVIDER_SITE_OTHER): Payer: Medicare Other | Admitting: Interventional Cardiology

## 2021-01-13 ENCOUNTER — Encounter: Payer: Self-pay | Admitting: Interventional Cardiology

## 2021-01-13 ENCOUNTER — Other Ambulatory Visit: Payer: Self-pay

## 2021-01-13 VITALS — BP 164/60 | HR 66 | Ht 63.0 in | Wt 164.0 lb

## 2021-01-13 DIAGNOSIS — E1169 Type 2 diabetes mellitus with other specified complication: Secondary | ICD-10-CM

## 2021-01-13 DIAGNOSIS — I5032 Chronic diastolic (congestive) heart failure: Secondary | ICD-10-CM

## 2021-01-13 DIAGNOSIS — I10 Essential (primary) hypertension: Secondary | ICD-10-CM

## 2021-01-13 DIAGNOSIS — I25709 Atherosclerosis of coronary artery bypass graft(s), unspecified, with unspecified angina pectoris: Secondary | ICD-10-CM

## 2021-01-13 DIAGNOSIS — G4733 Obstructive sleep apnea (adult) (pediatric): Secondary | ICD-10-CM

## 2021-01-13 DIAGNOSIS — I4819 Other persistent atrial fibrillation: Secondary | ICD-10-CM

## 2021-01-13 DIAGNOSIS — Z5181 Encounter for therapeutic drug level monitoring: Secondary | ICD-10-CM

## 2021-01-13 DIAGNOSIS — Z7901 Long term (current) use of anticoagulants: Secondary | ICD-10-CM

## 2021-01-13 DIAGNOSIS — Z79899 Other long term (current) drug therapy: Secondary | ICD-10-CM

## 2021-01-13 DIAGNOSIS — E669 Obesity, unspecified: Secondary | ICD-10-CM

## 2021-01-13 NOTE — Patient Instructions (Signed)
Medication Instructions:  Your physician recommends that you continue on your current medications as directed. Please refer to the Current Medication list given to you today.  *If you need a refill on your cardiac medications before your next appointment, please call your pharmacy*   Lab Work: None If you have labs (blood work) drawn today and your tests are completely normal, you will receive your results only by: [name] MyChart Message (if you have MyChart) OR . A paper copy in the mail If you have any lab test that is abnormal or we need to change your treatment, we will call you to review the results.   Testing/Procedures: None   Follow-Up: At [facility], you and your health needs are our priority.  As part of our continuing mission to provide you with exceptional heart care, we have created designated Provider Care Teams.  These Care Teams include your primary Cardiologist (physician) and Advanced Practice Providers (APPs -  Physician Assistants and Nurse Practitioners) who all work together to provide you with the care you need, when you need it.  We recommend signing up for the patient portal called "MyChart".  Sign up information is provided on this After Visit Summary.  MyChart is used to connect with patients for Virtual Visits (Telemedicine).  Patients are able to view lab/test results, encounter notes, upcoming appointments, etc.  Non-urgent messages can be sent to your provider as well.   To learn more about what you can do with MyChart, go to [email or web address].    Your next appointment:   6 month(s)  The format for your next appointment:   In Person  Provider:   You may see Sinclair Grooms, MD or one of the following Advanced Practice Providers on your designated Care Team:    Kathyrn Drown, NP    Other Instructions  Monitor your blood pressure and let us know if the top number is consistently over 150.   Low-Sodium Eating Plan Sodium, which is an  element that makes up salt, helps you maintain a healthy balance of fluids in your body. Too much sodium can increase your blood pressure and cause fluid and waste to be held in your body. Your health care provider or dietitian may recommend following this plan if you have high blood pressure (hypertension), kidney disease, liver disease, or heart failure. Eating less sodium can help lower your blood pressure, reduce swelling, and protect your heart, liver, and kidneys. What are tips for following this plan? Reading food labels  The Nutrition Facts label lists the amount of sodium in one serving of the food. If you eat more than one serving, you must multiply the listed amount of sodium by the number of servings.  Choose foods with less than 140 mg of sodium per serving.  Avoid foods with 300 mg of sodium or more per serving. Shopping  Look for lower-sodium products, often labeled as "low-sodium" or "no salt added."  Always check the sodium content, even if foods are labeled as "unsalted" or "no salt added."  Buy fresh foods. ? Avoid canned foods and pre-made or frozen meals. ? Avoid canned, cured, or processed meats.  Buy breads that have less than 80 mg of sodium per slice.   Cooking  Eat more home-cooked food and less restaurant, buffet, and fast food.  Avoid adding salt when cooking. Use salt-free seasonings or herbs instead of table salt or sea salt. Check with your health care provider or pharmacist before using salt substitutes.  Cook with plant-based oils, such as canola, sunflower, or olive oil.   Meal planning  When eating at a restaurant, ask that your food be prepared with less salt or no salt, if possible. Avoid dishes labeled as brined, pickled, cured, smoked, or made with soy sauce, miso, or teriyaki sauce.  Avoid foods that contain MSG (monosodium glutamate). MSG is sometimes added to [place] food, bouillon, and some canned foods.  Make meals that can be grilled,  baked, poached, roasted, or steamed. These are generally made with less sodium. General information Most people on this plan should limit their sodium intake to 1,500-2,000 mg (milligrams) of sodium each day. What foods should I eat? Fruits Fresh, frozen, or canned fruit. Fruit juice. Vegetables Fresh or frozen vegetables. "No salt added" canned vegetables. "No salt added" tomato sauce and paste. Low-sodium or reduced-sodium tomato and vegetable juice. Grains Low-sodium cereals, including oats, puffed wheat and rice, and shredded wheat. Low-sodium crackers. Unsalted rice. Unsalted pasta. Low-sodium bread. Whole-grain breads and whole-grain pasta. Meats and other proteins Fresh or frozen (no salt added) meat, poultry, seafood, and fish. Low-sodium canned tuna and salmon. Unsalted nuts. Dried peas, beans, and lentils without added salt. Unsalted canned beans. Eggs. Unsalted nut butters. Dairy Milk. Soy milk. Cheese that is naturally low in sodium, such as ricotta cheese, fresh mozzarella, or Swiss cheese. Low-sodium or reduced-sodium cheese. Cream cheese. Yogurt. Seasonings and condiments Fresh and dried herbs and spices. Salt-free seasonings. Low-sodium mustard and ketchup. Sodium-free salad dressing. Sodium-free light mayonnaise. Fresh or refrigerated horseradish. Lemon juice. Vinegar. Other foods Homemade, reduced-sodium, or low-sodium soups. Unsalted popcorn and pretzels. Low-salt or salt-free chips. The items listed above may not be a complete list of foods and beverages you can eat. Contact a dietitian for more information. What foods should I avoid? Vegetables Sauerkraut, pickled vegetables, and relishes. Olives. [place] fries. Onion rings. Regular canned vegetables (not low-sodium or reduced-sodium). Regular canned tomato sauce and paste (not low-sodium or reduced-sodium). Regular tomato and vegetable juice (not low-sodium or reduced-sodium). Frozen vegetables in sauces. Grains Instant  hot cereals. Bread stuffing, pancake, and biscuit mixes. Croutons. Seasoned rice or pasta mixes. Noodle soup cups. Boxed or frozen macaroni and cheese. Regular salted crackers. Self-rising flour. Meats and other proteins Meat or fish that is salted, canned, smoked, spiced, or pickled. Precooked or cured meat, such as sausages or meat loaves. Berniece Salines. Ham. Pepperoni. Hot dogs. Corned beef. Chipped beef. Salt pork. Jerky. Pickled herring. Anchovies and sardines. Regular canned tuna. Salted nuts. Dairy Processed cheese and cheese spreads. Hard cheeses. Cheese curds. Blue cheese. Feta cheese. String cheese. Regular cottage cheese. Buttermilk. Canned milk. Fats and oils Salted butter. Regular margarine. Ghee. Bacon fat. Seasonings and condiments Onion salt, garlic salt, seasoned salt, table salt, and sea salt. Canned and packaged gravies. Worcestershire sauce. Tartar sauce. Barbecue sauce. Teriyaki sauce. Soy sauce, including reduced-sodium. Steak sauce. Fish sauce. Oyster sauce. Cocktail sauce. Horseradish that you find on the shelf. Regular ketchup and mustard. Meat flavorings and tenderizers. Bouillon cubes. Hot sauce. Pre-made or packaged marinades. Pre-made or packaged taco seasonings. Relishes. Regular salad dressings. Salsa. Other foods Salted popcorn and pretzels. Corn chips and puffs. Potato and tortilla chips. Canned or dried soups. Pizza. Frozen entrees and pot pies. The items listed above may not be a complete list of foods and beverages you should avoid. Contact a dietitian for more information. Summary  Eating less sodium can help lower your blood pressure, reduce swelling, and protect your heart, liver, and kidneys.  Most  people on this plan should limit their sodium intake to 1,500-2,000 mg (milligrams) of sodium each day.  Canned, boxed, and frozen foods are high in sodium. Restaurant foods, fast foods, and pizza are also very high in sodium. You also get sodium by adding salt to  food.  Try to cook at home, eat more fresh fruits and vegetables, and eat less fast food and canned, processed, or prepared foods. This information is not intended to replace advice given to you by your health care provider. Make sure you discuss any questions you have with your health care provider. Document Revised: 10/31/2019 Document Reviewed: 08/27/2019 Elsevier Patient Education  2021 [employer].

## 2021-01-31 ENCOUNTER — Other Ambulatory Visit: Payer: Self-pay

## 2021-01-31 MED ORDER — EMPAGLIFLOZIN 10 MG PO TABS: 10.0000 mg | ORAL_TABLET | Freq: Every day | ORAL | 2 refills | Status: DC

## 2021-03-18 ENCOUNTER — Ambulatory Visit: Payer: Medicare Other | Admitting: Interventional Cardiology

## 2021-04-19 ENCOUNTER — Other Ambulatory Visit: Payer: Self-pay

## 2021-04-19 NOTE — Telephone Encounter (Signed)
Prescription refill request for Eliquis received. Indication: A Fib Last office visit: 01/13/21 Scr: 1.55 Age: 80 Weight: 74kg

## 2021-04-20 MED ORDER — APIXABAN 2.5 MG PO TABS: 2.5000 mg | ORAL_TABLET | Freq: Two times a day (BID) | ORAL | 1 refills | Status: DC

## 2021-04-20 NOTE — Telephone Encounter (Signed)
Called pt to advise her of need for dose reduction of Eliquis down to 2.5mg  BID due age now 55 and SCr > 1.5. She was appreciative for the call.

## 2021-04-26 ENCOUNTER — Telehealth: Payer: Self-pay | Admitting: Interventional Cardiology

## 2021-04-26 MED ORDER — CARVEDILOL 25 MG PO TABS: 25.0000 mg | ORAL_TABLET | Freq: Two times a day (BID) | ORAL | 3 refills | Status: AC

## 2021-04-26 NOTE — Telephone Encounter (Signed)
*  STAT* If patient is at the pharmacy, call can be transferred to refill team.   1. Which medications need to be refilled? (please list name of each medication and dose if known) carvedilol (COREG) 25 MG tablet (Expired)  2. Which pharmacy/location (including street and city if local pharmacy) is medication to be sent to?  [place], [place] - [address] AT [place]  3. Do they need a 30 day or 90 day supply? [place]

## 2021-04-26 NOTE — Telephone Encounter (Signed)
Refill for Carvedilol 25 mg has been sent to [facility], per pts request.

## 2021-05-03 ENCOUNTER — Inpatient Hospital Stay (HOSPITAL_BASED_OUTPATIENT_CLINIC_OR_DEPARTMENT_OTHER): Payer: Medicare Other | Admitting: Oncology

## 2021-05-03 ENCOUNTER — Inpatient Hospital Stay: Payer: Medicare Other | Attending: Oncology

## 2021-05-03 ENCOUNTER — Other Ambulatory Visit: Payer: Self-pay

## 2021-05-03 VITALS — BP 162/57 | HR 89 | Temp 99.2°F | Resp 17 | Ht 63.0 in | Wt 171.5 lb

## 2021-05-03 DIAGNOSIS — N189 Chronic kidney disease, unspecified: Secondary | ICD-10-CM | POA: Diagnosis not present

## 2021-05-03 DIAGNOSIS — K909 Intestinal malabsorption, unspecified: Secondary | ICD-10-CM | POA: Insufficient documentation

## 2021-05-03 DIAGNOSIS — D508 Other iron deficiency anemias: Secondary | ICD-10-CM | POA: Diagnosis present

## 2021-05-03 DIAGNOSIS — D631 Anemia in chronic kidney disease: Secondary | ICD-10-CM | POA: Diagnosis not present

## 2021-05-03 LAB — CBC WITH DIFFERENTIAL (CANCER CENTER ONLY)
Abs Immature Granulocytes: 0.02 10*3/uL (ref 0.00–0.07)
Basophils Absolute: 0 10*3/uL (ref 0.0–0.1)
Basophils Relative: 0 %
Eosinophils Absolute: 0.2 10*3/uL (ref 0.0–0.5)
Eosinophils Relative: 2 %
HCT: 36.3 % (ref 36.0–46.0)
Hemoglobin: 11.2 g/dL — ABNORMAL LOW (ref 12.0–15.0)
Immature Granulocytes: 0 %
Lymphocytes Relative: 21 %
Lymphs Abs: 1.6 10*3/uL (ref 0.7–4.0)
MCH: 28 pg (ref 26.0–34.0)
MCHC: 30.9 g/dL (ref 30.0–36.0)
MCV: 90.8 fL (ref 80.0–100.0)
Monocytes Absolute: 0.7 10*3/uL (ref 0.1–1.0)
Monocytes Relative: 10 %
Neutro Abs: 5 10*3/uL (ref 1.7–7.7)
Neutrophils Relative %: 67 %
Platelet Count: 141 10*3/uL — ABNORMAL LOW (ref 150–400)
RBC: 4 MIL/uL (ref 3.87–5.11)
RDW: 14.4 % (ref 11.5–15.5)
WBC Count: 7.6 10*3/uL (ref 4.0–10.5)
nRBC: 0 % (ref 0.0–0.2)

## 2021-05-03 LAB — CMP (CANCER CENTER ONLY)
ALT: 32 U/L (ref 0–44)
AST: 30 U/L (ref 15–41)
Albumin: 3.7 g/dL (ref 3.5–5.0)
Alkaline Phosphatase: 49 U/L (ref 38–126)
Anion gap: 11 (ref 5–15)
BUN: 31 mg/dL — ABNORMAL HIGH (ref 8–23)
CO2: 25 mmol/L (ref 22–32)
Calcium: 9.2 mg/dL (ref 8.9–10.3)
Chloride: 105 mmol/L (ref 98–111)
Creatinine: 1.56 mg/dL — ABNORMAL HIGH (ref 0.44–1.00)
GFR, Estimated: 33 mL/min — ABNORMAL LOW (ref >60)
Glucose, Bld: 225 mg/dL — ABNORMAL HIGH (ref 70–99)
Potassium: 4.4 mmol/L (ref 3.5–5.1)
Sodium: 141 mmol/L (ref 135–145)
Total Bilirubin: 0.4 mg/dL (ref 0.3–1.2)
Total Protein: 6.8 g/dL (ref 6.5–8.1)

## 2021-05-03 LAB — FERRITIN: Ferritin: 46 ng/mL (ref 11–307)

## 2021-05-03 LAB — IRON AND TIBC
Iron: 63 ug/dL (ref 41–142)
Saturation Ratios: 21 % (ref 21–57)
TIBC: 303 ug/dL (ref 236–444)
UIBC: 239 ug/dL (ref 120–384)

## 2021-05-03 NOTE — Progress Notes (Signed)
Hematology and Oncology Follow Up Visit  Doris Jefferson [id number] [birth year] 80 y.o. 05/03/2021 8:57 AM Aletha Halim., PA-CKaplan, Baldemar Friday., PA-C   Principle Diagnosis: 80 year old woman with iron deficiency anemia related to poor iron absorption as well as chronic renal disease diagnosed in 2019.     Prior Therapy: Iron infusion intermittently with last treatment given in August 2021.  Current therapy: Active surveillance with repeat intravenous iron as needed.  Interim History: Doris Jefferson returns today for repeat evaluation.  Since her last visit, she reports feeling well without any major complaints.  She denies any nausea, vomiting or abdominal pain.  She denies any recent hospitalizations or illnesses.  She continues to be on Eliquis and her dose was reduced without any bleeding noted.  She denies any hematochezia, melena or hemoptysis.  She denies any hospitalization or illnesses.  Her performance status quality of life remains intact.  She does ambulate without the help of a cane or a walker.        Medications: Updated on review. Current Outpatient Medications  Medication Sig Dispense Refill   acetaminophen (TYLENOL) 325 MG tablet Take 650 mg by mouth every 6 (six) hours as needed for moderate pain or headache.     amiodarone (PACERONE) 200 MG tablet Take 1 tablet (200 mg total) by mouth daily. 90 tablet 3   apixaban (ELIQUIS) 2.5 MG TABS tablet Take 1 tablet (2.5 mg total) by mouth 2 (two) times daily. 180 tablet 1   buPROPion (WELLBUTRIN XL) 300 MG 24 hr tablet Take 300 mg by mouth daily.      carvedilol (COREG) 25 MG tablet Take 1 tablet (25 mg total) by mouth 2 (two) times daily. 180 tablet 3   empagliflozin (JARDIANCE) 10 MG TABS tablet Take 1 tablet (10 mg total) by mouth daily before breakfast. 90 tablet 2   furosemide (LASIX) 20 MG tablet TAKE 1 TABLET(20 MG) BY MOUTH DAILY 90 tablet 3   liraglutide (VICTOZA) 18 MG/3ML SOPN Inject 1.8 mg into the skin daily.       loperamide (IMODIUM) 2 MG capsule Take 1 capsule (2 mg total) by mouth as needed for diarrhea or loose stools. 30 capsule 0   magnesium oxide (MAGNESIUM-OXIDE) 400 (241.3 Mg) MG tablet Take 1 tablet (400 mg total) by mouth 2 (two) times daily. 180 tablet 2   metFORMIN (GLUCOPHAGE) 1000 MG tablet Take 1,000 mg by mouth 2 (two) times a day.     Potassium 99 MG TABS Take 99 mg by mouth 3 (three) times daily.      rosuvastatin (CRESTOR) 20 MG tablet Take 20 mg by mouth daily.     sildenafil (REVATIO) 20 MG tablet Take 1 tablet (20 mg total) by mouth 3 (three) times daily. 90 tablet 3   No current facility-administered medications for this visit.     Allergies:  Allergies  Allergen Reactions   Minocin [Minocycline Hcl] Swelling and Other (See Comments)    THROAT SWELLING   Benazepril Cough   Hydrochlorothiazide Itching   Tape Rash       Physical Exam:   Blood pressure (!) 162/57, pulse 89, temperature 99.2 F (37.3 C), temperature source Oral, resp. rate 17, height '5\' 3"'$  (1.6 m), weight 171 lb 8 oz (77.8 kg), SpO2 100 %.     ECOG: 1   General appearance: Alert, awake without any distress. Head: Atraumatic without abnormalities Oropharynx: Without any thrush or ulcers. Eyes: No scleral icterus. Lymph nodes: No lymphadenopathy noted in the  cervical, supraclavicular, or axillary nodes Heart:regular rate and rhythm, without any murmurs or gallops.   Lung: Clear to auscultation without any rhonchi, wheezes or dullness to percussion. Abdomin: Soft, nontender without any shifting dullness or ascites. Musculoskeletal: No clubbing or cyanosis. Neurological: No motor or sensory deficits. Skin: No rashes or lesions.          Lab Results: Lab Results  Component Value Date   WBC 8.4 11/03/2020   HGB 11.5 (L) 11/03/2020   HCT 37.6 11/03/2020   MCV 92.8 11/03/2020   PLT 169 11/03/2020     Chemistry      Component Value Date/Time   NA 142 11/03/2020 0839   NA 142  07/07/2020 0923   K 4.4 11/03/2020 0839   CL 107 11/03/2020 0839   CO2 26 11/03/2020 0839   BUN 29 (H) 11/03/2020 0839   BUN 23 07/07/2020 0923   CREATININE 1.53 (H) 11/03/2020 0839   CREATININE 1.34 (H) 02/04/2020 1456      Component Value Date/Time   CALCIUM 9.0 11/03/2020 0839   ALKPHOS 92 11/03/2020 0839   AST 28 11/03/2020 0839   ALT 34 11/03/2020 0839   BILITOT 0.4 11/03/2020 0839       Impression and Plan:  80 year old woman with:   1.    Iron deficiency anemia related to poor oral iron absorption and chronic renal insufficiency diagnosed in 2019.     Her disease status updated at this time and treatment choices were reviewed.  Laboratory data from the last year were discussed with hemoglobin continues to be above 11.  Iron studies are currently pending from today but from January showed iron level of 59 and ferritin of 122.  Risks and benefits of intravenous iron infusion were discussed.  Complications that include arthralgias, myalgias and infusion related complaints were discussed.  The role for growth factor support in the form of Procrit or Aranesp was also discussed given her renal failure with GFR close to 30 cc/min.  Her hemoglobin is adequate at this time and will continue with active surveillance.   2.  Follow-up: In 6 months for repeat evaluation.  30  minutes were dedicated to this visit.  The time was spent on reviewing laboratory data, disease status update, treatment choices and outlining future care review.      Zola Button, MD 7/26/20228:57 AM

## 2021-05-17 ENCOUNTER — Other Ambulatory Visit: Payer: Self-pay | Admitting: *Deleted

## 2021-05-17 MED ORDER — MAGNESIUM OXIDE 400 MG PO TABS: 400.0000 mg | ORAL_TABLET | Freq: Two times a day (BID) | ORAL | 3 refills | Status: AC

## 2021-05-17 MED ORDER — AMIODARONE HCL 200 MG PO TABS: 200.0000 mg | ORAL_TABLET | Freq: Every day | ORAL | 1 refills | Status: DC

## 2021-05-31 ENCOUNTER — Other Ambulatory Visit: Payer: Self-pay | Admitting: *Deleted

## 2021-05-31 MED ORDER — FUROSEMIDE 20 MG PO TABS: ORAL_TABLET | ORAL | 1 refills | Status: DC

## 2021-06-22 ENCOUNTER — Other Ambulatory Visit: Payer: Self-pay

## 2021-06-22 MED ORDER — FUROSEMIDE 20 MG PO TABS: ORAL_TABLET | ORAL | 2 refills | Status: AC

## 2021-07-20 NOTE — Progress Notes (Signed)
Cardiology Office Note:    Date:  07/22/2021   ID:  Doris Jefferson, DOB 04-26-1941, MRN [id number]  PCP:  Aletha Halim., PA-C  Cardiologist:  Sinclair Grooms, MD   Referring MD: Aletha Halim., PA-C   Chief Complaint  Patient presents with   Atrial Fibrillation   Coronary Artery Disease   Hypertension    History of Present Illness:    Doris Jefferson is a 80 y.o. female with a hx of paroxysmal atrial fibrillation, OSA, hypertension, hyperlipidemia, aortic stenosis, type II DM, and recent AVR(bioprosthesis), LAA clip, and 3 vessel CABG (with LIMA to LAD, SVG to OM, and SVG to RCA) 09/27/18. Required cardioversion and IV amio loading prior to DC (12/30).  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 Converted on PO amiodarone load.   She feels well.  She denies chest pain.  No palpitations, syncope, lower extremity swelling, or orthopnea.  Denies nitroglycerin use.  No medication side effects.  Laboratory data tends to be done by Bing Matter at [facility] in [facility].  Past Medical History:  Diagnosis Date   Coronary artery disease    Depression    GERD (gastroesophageal reflux disease)    Heart murmur    HTN (hypertension)    Hyperlipidemia    Iron deficiency anemia    Obesity (BMI 30-39.9) 03/04/2014   OSA on CPAP    severe with AHI 31/hr   PAF (paroxysmal atrial fibrillation) ([place])    Pneumonia    "twice; years ago" (09/24/2018)   Skin cancer    "left neck; burned off"   Type II diabetes mellitus ([place])     Past Surgical History:  Procedure Laterality Date   ABDOMINAL HYSTERECTOMY     ANTERIOR CERVICAL DECOMP/DISCECTOMY FUSION     "cage around my neck; Dr. Erline Levine"   AORTIC VALVE REPLACEMENT N/A 09/27/2018   Procedure: AORTIC VALVE REPLACEMENT (AVR) USING MAGNA EASE SIZE 21 MM;  Surgeon: Grace Isaac, MD;  Location: [facility];  Service: Open Heart Surgery;  Laterality: N/A;   BACK  SURGERY     CARDIAC CATHETERIZATION  09/24/2018   CARDIOVERSION N/A 12/19/2013   Procedure: CARDIOVERSION;  Surgeon: Sinclair Grooms, MD;  Location[facility] ENDOSCOPY;  Service: Cardiovascular;  Laterality: N/A;   CARDIOVERSION N/A 08/16/2015   Procedure: CARDIOVERSION;  Surgeon: Larey Dresser, MD;  Location: [facility];  Service: Cardiovascular;  Laterality: N/A;   CARDIOVERSION N/A 09/13/2015   Procedure: CARDIOVERSION;  Surgeon: Sueanne Margarita, MD;  Location: [facility] ENDOSCOPY;  Service: Cardiovascular;  Laterality: N/A;   CARDIOVERSION N/A 10/07/2018   Procedure: CARDIOVERSION;  Surgeon: Sanda Klein, MD;  Location: [facility] ENDOSCOPY;  Service: Cardiovascular;  Laterality: N/A;   CATARACT EXTRACTION W/ INTRAOCULAR LENS  IMPLANT, BILATERAL Bilateral    CLIPPING OF ATRIAL APPENDAGE N/A 09/27/2018   Procedure: CLIPPING OF ATRIAL APPENDAGE USING ATRICURE FLEXV SIZE 35MM;  Surgeon: Grace Isaac, MD;  Location: [facility];  Service: Open Heart Surgery;  Laterality: N/A;   COLONOSCOPY WITH PROPOFOL N/A 11/13/2017   Procedure: COLONOSCOPY WITH PROPOFOL;  Surgeon: Wilford Corner, MD;  Location: [facility] ENDOSCOPY;  Service: Endoscopy;  Laterality: N/A;  ANTIBIOTIC NEEDED BEFORE PROCEDURE   CORONARY ARTERY BYPASS GRAFT N/A 09/27/2018   Procedure: CORONARY ARTERY BYPASS GRAFTING (CABG) x three, using left internal mammary artery and right leg greater saphenous vein harvested endoscopically;  Surgeon: Grace Isaac, MD;  Location: [facility];  Service: Open Heart  Surgery;  Laterality: N/A;   ESOPHAGOGASTRODUODENOSCOPY (EGD) WITH PROPOFOL N/A 11/13/2017   Procedure: ESOPHAGOGASTRODUODENOSCOPY (EGD) WITH PROPOFOL;  Surgeon: Wilford Corner, MD;  Location: [facility] ENDOSCOPY;  Service: Endoscopy;  Laterality: N/A;   EYE SURGERY     IR FLUORO GUIDED NEEDLE PLC ASPIRATION/INJECTION LOC  12/05/2019   RIGHT/LEFT HEART CATH AND CORONARY ANGIOGRAPHY N/A 09/24/2018   Procedure: RIGHT/LEFT HEART CATH AND CORONARY ANGIOGRAPHY;   Surgeon: Jettie Booze, MD;  Location: [facility];  Service: Cardiovascular;  Laterality: N/A;   TEE WITHOUT CARDIOVERSION N/A 09/27/2018   Procedure: TRANSESOPHAGEAL ECHOCARDIOGRAM (TEE);  Surgeon: Grace Isaac, MD;  Location: [place];  Service: Open Heart Surgery;  Laterality: N/A;   TUBAL LIGATION      Current Medications: Current Meds  Medication Sig   acetaminophen (TYLENOL) 325 MG tablet Take 650 mg by mouth every 6 (six) hours as needed for moderate pain or headache.   amiodarone (PACERONE) 200 MG tablet Take 1 tablet (200 mg total) by mouth daily.   apixaban (ELIQUIS) 2.5 MG TABS tablet Take 1 tablet (2.5 mg total) by mouth 2 (two) times daily.   buPROPion (WELLBUTRIN XL) 300 MG 24 hr tablet Take 300 mg by mouth daily.    carvedilol (COREG) 25 MG tablet Take 1 tablet (25 mg total) by mouth 2 (two) times daily.   empagliflozin (JARDIANCE) 10 MG TABS tablet Take 1 tablet (10 mg total) by mouth daily before breakfast.   furosemide (LASIX) 20 MG tablet TAKE 1 TABLET(20 MG) BY MOUTH DAILY   liraglutide (VICTOZA) 18 MG/3ML SOPN Inject 1.8 mg into the skin daily.    loperamide (IMODIUM) 2 MG capsule Take 1 capsule (2 mg total) by mouth as needed for diarrhea or loose stools.   magnesium oxide (MAGNESIUM-OXIDE) 400 (241.3 Mg) MG tablet Take 1 tablet (400 mg total) by mouth 2 (two) times daily.   metFORMIN (GLUCOPHAGE) 1000 MG tablet Take 1,000 mg by mouth 2 (two) times a day.   Potassium 99 MG TABS Take 99 mg by mouth 3 (three) times daily.    rosuvastatin (CRESTOR) 20 MG tablet Take 20 mg by mouth daily.   sildenafil (REVATIO) 20 MG tablet Take 1 tablet (20 mg total) by mouth 3 (three) times daily.     Allergies:   Minocin [minocycline hcl], Benazepril, Hydrochlorothiazide, and Tape   Social History   Socioeconomic History   Marital status: Married    Spouse name: Thomsa   Number of children: Not on file   Years of education: 12   Highest education level: High  school graduate  Occupational History   Occupation: Retired  Tobacco Use   Smoking status: Never   Smokeless tobacco: Never  Scientific laboratory technician Use: Never used  Substance and Sexual Activity   Alcohol use: Never   Drug use: Never   Sexual activity: Not Currently  Other Topics Concern   Not on file  Social History Narrative   Not on file   Social Determinants of Health   Financial Resource Strain: Not on file  Food Insecurity: Not on file  Transportation Needs: Not on file  Physical Activity: Not on file  Stress: Not on file  Social Connections: Not on file     Family History: The patient's family history includes Heart disease in her father; Hypertension in her brother and father; Melanoma in her mother.  ROS:   Please see the history of present illness.    She has no complaints.  All other systems reviewed and are negative.  EKGs/Labs/Other Studies Reviewed:    The following studies were reviewed today: Liver function tests were normal in September 2022 BUN and creatinine 34 and 1.13 May 2021 TSH 1.61 August 2022 Chest x-ray February 2021: EXAM: CHEST - 2 VIEW   COMPARISON:  Chest radiograph dated 12/12/2018.   FINDINGS: There is no focal consolidation, pleural effusion, or pneumothorax. Mild diffuse interstitial coarsening. The cardiac silhouette is within normal limits. Median sternotomy wires, mechanical heart valve and left atrial appendage occlusive device. No acute osseous pathology. Cervical ACDF.   IMPRESSION: No active cardiopulmonary disease.  EKG:  EKG normal sinus rhythm, left anterior hemiblock, nonspecific ST-T wave abnormality.  When compared to July 2021, no changes noted.  Recent Labs: 05/03/2021: ALT 32; BUN 31; Creatinine 1.56; Hemoglobin 11.2; Platelet Count 141; Potassium 4.4; Sodium 141  Recent Lipid Panel    Component Value Date/Time   CHOL 145 05/08/2018 0820   TRIG 152 (H) 05/08/2018 0820   HDL 43 05/08/2018 0820   CHOLHDL  3.4 05/08/2018 0820   LDLCALC 72 05/08/2018 0820    Physical Exam:    VS:  BP (!) 190/78   Pulse 63   Ht 5\' 3"  (1.6 m)   Wt 172 lb 6.4 oz (78.2 kg)   LMP  (LMP Unknown)   SpO2 96%   BMI 30.54 kg/m     Wt Readings from Last 3 Encounters:  07/22/21 172 lb 6.4 oz (78.2 kg)  05/03/21 171 lb 8 oz (77.8 kg)  01/13/21 164 lb (74.4 kg)     GEN: Overweight. No acute distress HEENT: Normal NECK: No JVD. LYMPHATICS: No lymphadenopathy CARDIAC: 1/6 systolic murmur. RRR no gallop, or edema. VASCULAR:  Normal Pulses. No bruits. RESPIRATORY:  Clear to auscultation without rales, wheezing or rhonchi  ABDOMEN: Soft, non-tender, non-distended, No pulsatile mass, MUSCULOSKELETAL: No deformity  SKIN: Warm and dry NEUROLOGIC:  Alert and oriented x 3 PSYCHIATRIC:  Normal affect   ASSESSMENT:    1. Persistent atrial fibrillation ([place])   2. Primary hypertension   3. Coronary artery disease involving coronary bypass graft of native heart with angina pectoris ([place])   4. OSA (obstructive sleep apnea)   5. Chronic diastolic heart failure ([place])   6. Anticoagulation goal of INR 2 to 3   7. Diabetes mellitus type 2 in obese ([place])   8. On amiodarone therapy   9. Long term current use of amiodarone    PLAN:    In order of problems listed above:  Rhythm control on amiodarone 200 mg a day.  Continue therapy.  Chest x-ray will need to be done.  Most recent liver and TSH panel 1 month ago is unremarkable.  Clinical follow-up in 6 months. Mild elevation of systolic blood pressure today.  Rechecked at 150/70 mmHg.  Home blood pressures that she was kind enough to bring run between 130 and 673 mmHg systolic with all diastolic pressures less than 70. Stable without anginal complaints.  Secondary prevention reviewed. Encouraged CPAP compliance No evidence of volume overload.  We will get a PA and lateral chest x-ray today.  Continue Jardiance. Taking the lower dose Eliquis 2.5 mg twice daily We did not  discuss diabetes but she is on Jardiance which is excellent.  This will also help manage her diastolic heart failure. Yearly requirement for PA and lateral chest x-ray.  Twice yearly requirement for TSH and liver testing.    26-month follow-up.   Medication Adjustments/Labs and Tests  Ordered: Current medicines are reviewed at length with the patient today.  Concerns regarding medicines are outlined above.  Orders Placed This Encounter  Procedures   EKG 12-Lead   No orders of the defined types were placed in this encounter.   There are no Patient Instructions on file for this visit.   Signed, Sinclair Grooms, MD  07/22/2021 10:50 AM    [place]

## 2021-07-22 ENCOUNTER — Encounter: Payer: Self-pay | Admitting: Interventional Cardiology

## 2021-07-22 ENCOUNTER — Ambulatory Visit (INDEPENDENT_AMBULATORY_CARE_PROVIDER_SITE_OTHER): Payer: Medicare Other | Admitting: Interventional Cardiology

## 2021-07-22 ENCOUNTER — Ambulatory Visit
Admission: RE | Admit: 2021-07-22 | Discharge: 2021-07-22 | Disposition: A | Discharge status: Home / Self Care | Payer: Medicare Other | Source: Ambulatory Visit | Attending: Interventional Cardiology | Admitting: Interventional Cardiology

## 2021-07-22 ENCOUNTER — Other Ambulatory Visit: Payer: Self-pay

## 2021-07-22 VITALS — BP 190/78 | HR 63 | Ht 63.0 in | Wt 172.4 lb

## 2021-07-22 DIAGNOSIS — I4819 Other persistent atrial fibrillation: Secondary | ICD-10-CM | POA: Diagnosis not present

## 2021-07-22 DIAGNOSIS — I25709 Atherosclerosis of coronary artery bypass graft(s), unspecified, with unspecified angina pectoris: Secondary | ICD-10-CM

## 2021-07-22 DIAGNOSIS — E669 Obesity, unspecified: Secondary | ICD-10-CM

## 2021-07-22 DIAGNOSIS — Z5181 Encounter for therapeutic drug level monitoring: Secondary | ICD-10-CM

## 2021-07-22 DIAGNOSIS — G4733 Obstructive sleep apnea (adult) (pediatric): Secondary | ICD-10-CM | POA: Diagnosis not present

## 2021-07-22 DIAGNOSIS — I5032 Chronic diastolic (congestive) heart failure: Secondary | ICD-10-CM

## 2021-07-22 DIAGNOSIS — Z79899 Other long term (current) drug therapy: Secondary | ICD-10-CM

## 2021-07-22 DIAGNOSIS — I10 Essential (primary) hypertension: Secondary | ICD-10-CM

## 2021-07-22 DIAGNOSIS — E1169 Type 2 diabetes mellitus with other specified complication: Secondary | ICD-10-CM

## 2021-07-22 DIAGNOSIS — Z7901 Long term (current) use of anticoagulants: Secondary | ICD-10-CM

## 2021-07-22 NOTE — Patient Instructions (Signed)
Medication Instructions:  Your physician recommends that you continue on your current medications as directed. Please refer to the Current Medication list given to you today.  *If you need a refill on your cardiac medications before your next appointment, please call your pharmacy*   Lab Work: None If you have labs (blood work) drawn today and your tests are completely normal, you will receive your results only by: [place] (if you have MyChart) OR A paper copy in the mail If you have any lab test that is abnormal or we need to change your treatment, we will call you to review the results.   Testing/Procedures: A chest x-ray takes a picture of the organs and structures inside the chest, including the heart, lungs, and blood vessels. This test can show several things, including, whether the heart is enlarges; whether fluid is building up in the lungs; and whether pacemaker / defibrillator leads are still in place.  Go to [place] at [address]   Follow-Up: At [facility], you and your health needs are our priority.  As part of our continuing mission to provide you with exceptional heart care, we have created designated Provider Care Teams.  These Care Teams include your primary Cardiologist (physician) and Advanced Practice Providers (APPs -  Physician Assistants and Nurse Practitioners) who all work together to provide you with the care you need, when you need it.  We recommend signing up for the patient portal called "MyChart".  Sign up information is provided on this After Visit Summary.  MyChart is used to connect with patients for Virtual Visits (Telemedicine).  Patients are able to view lab/test results, encounter notes, upcoming appointments, etc.  Non-urgent messages can be sent to your provider as well.   To learn more about what you can do with MyChart, go to [email or web address].    Your next appointment:   6  month(s)  The format for your next appointment:   In Person  Provider:   You may see Sinclair Grooms, MD or one of the following Advanced Practice Providers on your designated Care Team:   Cecilie Kicks, NP   Other Instructions

## 2021-10-04 ENCOUNTER — Other Ambulatory Visit: Payer: Self-pay | Admitting: Internal Medicine

## 2021-10-04 NOTE — Telephone Encounter (Signed)
Followed by Dr Tamala Julian

## 2021-10-20 ENCOUNTER — Other Ambulatory Visit: Payer: Self-pay | Admitting: Interventional Cardiology

## 2021-10-20 NOTE — Telephone Encounter (Signed)
Prescription refill request for Eliquis received. Indication: Afib  Last office visit:07/22/21 Tamala Julian)  Scr: 1.50 (05/24/21) Age: 81 Weight: 78.2kg  Appropriate dose and refill sent to requested pharmacy.

## 2021-10-20 NOTE — Telephone Encounter (Signed)
Prescription refill request for Eliquis received. Indication: Afib  Last office visit: 07/22/21 Tamala Julian)  Scr: 1.50 (05/24/21)  Age: 81 Weight: 78.2kg  Appropriate dose and refill sent to requested pharmacy.

## 2021-11-01 ENCOUNTER — Other Ambulatory Visit: Payer: Self-pay

## 2021-11-01 ENCOUNTER — Inpatient Hospital Stay (HOSPITAL_BASED_OUTPATIENT_CLINIC_OR_DEPARTMENT_OTHER): Payer: Medicare Other | Admitting: Oncology

## 2021-11-01 ENCOUNTER — Ambulatory Visit: Payer: Medicare Other | Admitting: Oncology

## 2021-11-01 ENCOUNTER — Other Ambulatory Visit: Payer: Medicare Other

## 2021-11-01 ENCOUNTER — Inpatient Hospital Stay: Payer: Medicare Other | Attending: Oncology

## 2021-11-01 VITALS — BP 157/98 | HR 60 | Temp 97.9°F | Resp 18 | Ht 63.0 in | Wt 173.8 lb

## 2021-11-01 DIAGNOSIS — D508 Other iron deficiency anemias: Secondary | ICD-10-CM

## 2021-11-01 DIAGNOSIS — D509 Iron deficiency anemia, unspecified: Secondary | ICD-10-CM | POA: Diagnosis present

## 2021-11-01 DIAGNOSIS — N189 Chronic kidney disease, unspecified: Secondary | ICD-10-CM | POA: Insufficient documentation

## 2021-11-01 DIAGNOSIS — D631 Anemia in chronic kidney disease: Secondary | ICD-10-CM | POA: Insufficient documentation

## 2021-11-01 LAB — CMP (CANCER CENTER ONLY)
ALT: 29 U/L (ref 0–44)
AST: 28 U/L (ref 15–41)
Albumin: 4.2 g/dL (ref 3.5–5.0)
Alkaline Phosphatase: 48 U/L (ref 38–126)
Anion gap: 11 (ref 5–15)
BUN: 27 mg/dL — ABNORMAL HIGH (ref 8–23)
CO2: 26 mmol/L (ref 22–32)
Calcium: 9.4 mg/dL (ref 8.9–10.3)
Chloride: 103 mmol/L (ref 98–111)
Creatinine: 1.57 mg/dL — ABNORMAL HIGH (ref 0.44–1.00)
GFR, Estimated: 33 mL/min — ABNORMAL LOW (ref >60)
Glucose, Bld: 142 mg/dL — ABNORMAL HIGH (ref 70–99)
Potassium: 4.2 mmol/L (ref 3.5–5.1)
Sodium: 140 mmol/L (ref 135–145)
Total Bilirubin: 0.4 mg/dL (ref 0.3–1.2)
Total Protein: 6.9 g/dL (ref 6.5–8.1)

## 2021-11-01 LAB — IRON AND IRON BINDING CAPACITY (CC-WL,HP ONLY)
Iron: 44 ug/dL (ref 28–170)
Saturation Ratios: 11 % (ref 10.4–31.8)
TIBC: 399 ug/dL (ref 250–450)
UIBC: 355 ug/dL (ref 148–442)

## 2021-11-01 LAB — CBC WITH DIFFERENTIAL (CANCER CENTER ONLY)
Abs Immature Granulocytes: 0.02 10*3/uL (ref 0.00–0.07)
Basophils Absolute: 0 10*3/uL (ref 0.0–0.1)
Basophils Relative: 1 %
Eosinophils Absolute: 0.2 10*3/uL (ref 0.0–0.5)
Eosinophils Relative: 3 %
HCT: 37 % (ref 36.0–46.0)
Hemoglobin: 11.4 g/dL — ABNORMAL LOW (ref 12.0–15.0)
Immature Granulocytes: 0 %
Lymphocytes Relative: 21 %
Lymphs Abs: 1.6 10*3/uL (ref 0.7–4.0)
MCH: 27.6 pg (ref 26.0–34.0)
MCHC: 30.8 g/dL (ref 30.0–36.0)
MCV: 89.6 fL (ref 80.0–100.0)
Monocytes Absolute: 0.7 10*3/uL (ref 0.1–1.0)
Monocytes Relative: 10 %
Neutro Abs: 5.2 10*3/uL (ref 1.7–7.7)
Neutrophils Relative %: 65 %
Platelet Count: 163 10*3/uL (ref 150–400)
RBC: 4.13 MIL/uL (ref 3.87–5.11)
RDW: 14.8 % (ref 11.5–15.5)
WBC Count: 7.8 10*3/uL (ref 4.0–10.5)
nRBC: 0 % (ref 0.0–0.2)

## 2021-11-01 LAB — FERRITIN: Ferritin: 30 ng/mL (ref 11–307)

## 2021-11-01 NOTE — Progress Notes (Signed)
Hematology and Oncology Follow Up Visit  Doris Jefferson [id number] September 22, 1941 81 y.o. 11/01/2021 2:31 PM Doris Jefferson., PA-CKaplan, Baldemar Friday., PA-C   Principle Diagnosis: 81 year old woman with anemia of chronic kidney disease diagnosed in 2019.  She has element of iron deficiency as well.    Prior Therapy: Iron infusion intermittently with last treatment given in August 2021.  Current therapy: Active surveillance with consideration for repeat IV iron and growth factor support.  Interim History: [name] presents today for a follow-up visit.  Since last visit, she reports no major changes in her health.  She denies any shortness of breath or difficulty breathing.  She denies any excessive fatigue or tiredness.  She denies any hematochezia, melena or hemoptysis.  Her performance status and quality of life remains unchanged.        Medications: Reviewed without changes. Current Outpatient Medications  Medication Sig Dispense Refill   acetaminophen (TYLENOL) 325 MG tablet Take 650 mg by mouth every 6 (six) hours as needed for moderate pain or headache.     amiodarone (PACERONE) 200 MG tablet Take 1 tablet (200 mg total) by mouth daily. 90 tablet 1   buPROPion (WELLBUTRIN XL) 300 MG 24 hr tablet Take 300 mg by mouth daily.      carvedilol (COREG) 25 MG tablet Take 1 tablet (25 mg total) by mouth 2 (two) times daily. 180 tablet 3   ELIQUIS 2.5 MG TABS tablet TAKE 1 TABLET(2.5 MG) BY MOUTH TWICE DAILY 180 tablet 1   furosemide (LASIX) 20 MG tablet TAKE 1 TABLET(20 MG) BY MOUTH DAILY 90 tablet 2   JARDIANCE 10 MG TABS tablet TAKE 1 TABLET(10 MG) BY MOUTH DAILY BEFORE BREAKFAST 90 tablet 2   liraglutide (VICTOZA) 18 MG/3ML SOPN Inject 1.8 mg into the skin daily.      loperamide (IMODIUM) 2 MG capsule Take 1 capsule (2 mg total) by mouth as needed for diarrhea or loose stools. 30 capsule 0   magnesium oxide (MAG-OX) 400 MG tablet Take 1 tablet (400 mg total) by mouth 2 (two) times daily.  (Patient not taking: Reported on 07/22/2021) 180 tablet 3   magnesium oxide (MAGNESIUM-OXIDE) 400 (241.3 Mg) MG tablet Take 1 tablet (400 mg total) by mouth 2 (two) times daily. 180 tablet 2   metFORMIN (GLUCOPHAGE) 1000 MG tablet Take 1,000 mg by mouth 2 (two) times a day.     Potassium 99 MG TABS Take 99 mg by mouth 3 (three) times daily.      rosuvastatin (CRESTOR) 20 MG tablet Take 20 mg by mouth daily.     sildenafil (REVATIO) 20 MG tablet Take 1 tablet (20 mg total) by mouth 3 (three) times daily. 90 tablet 3   No current facility-administered medications for this visit.     Allergies:  Allergies  Allergen Reactions   Minocin [Minocycline Hcl] Swelling and Other (See Comments)    THROAT SWELLING   Benazepril Cough   Hydrochlorothiazide Itching   Tape Rash       Physical Exam:    Blood pressure (!) 157/98, pulse 60, temperature 97.9 F (36.6 C), temperature source Temporal, resp. rate 18, height 5\' 3"  (1.6 m), weight 173 lb 12.8 oz (78.8 kg), SpO2 100 %.     ECOG: 1   General appearance: Comfortable appearing without any discomfort Head: Normocephalic without any trauma Oropharynx: Mucous membranes are moist and pink without any thrush or ulcers. Eyes: Pupils are equal and round reactive to light. Lymph nodes: No cervical,  supraclavicular, inguinal or axillary lymphadenopathy.   Heart:regular rate and rhythm.  S1 and S2 without leg edema. Lung: Clear without any rhonchi or wheezes.  No dullness to percussion. Abdomin: Soft, nontender, nondistended with good bowel sounds.  No hepatosplenomegaly. Musculoskeletal: No joint deformity or effusion.  Full range of motion noted. Neurological: No deficits noted on motor, sensory and deep tendon reflex exam. Skin: No petechial rash or dryness.  Appeared moist.            Lab Results: Lab Results  Component Value Date   WBC 7.6 05/03/2021   HGB 11.2 (L) 05/03/2021   HCT 36.3 05/03/2021   MCV 90.8 05/03/2021    PLT 141 (L) 05/03/2021     Chemistry      Component Value Date/Time   NA 141 05/03/2021 0901   NA 142 07/07/2020 0923   K 4.4 05/03/2021 0901   CL 105 05/03/2021 0901   CO2 25 05/03/2021 0901   BUN 31 (H) 05/03/2021 0901   BUN 23 07/07/2020 0923   CREATININE 1.56 (H) 05/03/2021 0901   CREATININE 1.34 (H) 02/04/2020 1456      Component Value Date/Time   CALCIUM 9.2 05/03/2021 0901   ALKPHOS 49 05/03/2021 0901   AST 30 05/03/2021 0901   ALT 32 05/03/2021 0901   BILITOT 0.4 05/03/2021 0901       Impression and Plan:  81 year old woman with:   1.   Anemia related to iron deficiency and chronic renal disease diagnosed in 2019.    Laboratory data from July 2022 showed adequate iron stores and hemoglobin of 11.2 and does not require any treatment at that time.  Treatment choices moving forward including repeat intravenous iron versus growth factor support in the form of Procrit or Aranesp were reviewed.  Laboratory data from today reviewed and continues to show improvement in her hemoglobin which is close to baseline of 11.4.  Based on these findings, I do not see any need for any additional testing, infusion or growth factor support.  I have recommended continued observation and consideration for additional intervention if her hemoglobin drops in the future.      2.  Follow-up: I am happy to see her in the future as needed.  30  minutes were spent on this encounter.  The time was dedicated to reviewing laboratory data, disease status update, treatment choices and future plan of care discussion.      Zola Button, MD 1/24/20232:31 PM

## 2021-11-21 ENCOUNTER — Other Ambulatory Visit: Payer: Self-pay

## 2021-11-21 MED ORDER — AMIODARONE HCL 200 MG PO TABS: 200.0000 mg | ORAL_TABLET | Freq: Every day | ORAL | 2 refills | Status: AC

## 2022-01-07 DEATH — deceased

## 2022-02-01 ENCOUNTER — Ambulatory Visit: Payer: Medicare Other | Admitting: Interventional Cardiology
